# Patient Record
Sex: Female | Born: 1935 | Race: White | Hispanic: No | Smoking: Current every day smoker
Health system: Southern US, Community
[De-identification: ages and names within clinical notes are randomized; demographics above are authoritative.]

## PROBLEM LIST (undated history)

## (undated) DIAGNOSIS — Z950 Presence of cardiac pacemaker: Secondary | ICD-10-CM

## (undated) DIAGNOSIS — F32A Depression, unspecified: Secondary | ICD-10-CM

## (undated) DIAGNOSIS — J449 Chronic obstructive pulmonary disease, unspecified: Secondary | ICD-10-CM

## (undated) DIAGNOSIS — H269 Unspecified cataract: Secondary | ICD-10-CM

## (undated) DIAGNOSIS — I779 Disorder of arteries and arterioles, unspecified: Secondary | ICD-10-CM

## (undated) DIAGNOSIS — R011 Cardiac murmur, unspecified: Secondary | ICD-10-CM

## (undated) DIAGNOSIS — IMO0002 Reserved for concepts with insufficient information to code with codable children: Secondary | ICD-10-CM

## (undated) DIAGNOSIS — F329 Major depressive disorder, single episode, unspecified: Secondary | ICD-10-CM

## (undated) DIAGNOSIS — F419 Anxiety disorder, unspecified: Secondary | ICD-10-CM

## (undated) DIAGNOSIS — N840 Polyp of corpus uteri: Secondary | ICD-10-CM

## (undated) DIAGNOSIS — Z8619 Personal history of other infectious and parasitic diseases: Secondary | ICD-10-CM

## (undated) DIAGNOSIS — E7800 Pure hypercholesterolemia, unspecified: Secondary | ICD-10-CM

## (undated) DIAGNOSIS — R896 Abnormal cytological findings in specimens from other organs, systems and tissues: Secondary | ICD-10-CM

## (undated) DIAGNOSIS — N950 Postmenopausal bleeding: Secondary | ICD-10-CM

## (undated) DIAGNOSIS — N83209 Unspecified ovarian cyst, unspecified side: Secondary | ICD-10-CM

## (undated) DIAGNOSIS — I10 Essential (primary) hypertension: Secondary | ICD-10-CM

## (undated) DIAGNOSIS — N939 Abnormal uterine and vaginal bleeding, unspecified: Secondary | ICD-10-CM

## (undated) DIAGNOSIS — Z87898 Personal history of other specified conditions: Secondary | ICD-10-CM

## (undated) DIAGNOSIS — N951 Menopausal and female climacteric states: Secondary | ICD-10-CM

## (undated) HISTORY — DX: Postmenopausal bleeding: N95.0

## (undated) HISTORY — DX: Anxiety disorder, unspecified: F41.9

## (undated) HISTORY — DX: Reserved for concepts with insufficient information to code with codable children: IMO0002

## (undated) HISTORY — DX: Depression, unspecified: F32.A

## (undated) HISTORY — DX: Personal history of other infectious and parasitic diseases: Z86.19

## (undated) HISTORY — DX: Disorder of arteries and arterioles, unspecified: I77.9

## (undated) HISTORY — PX: CATARACT EXTRACTION W/ INTRAOCULAR LENS IMPLANT: SHX1309

## (undated) HISTORY — PX: BREAST SURGERY: SHX581

## (undated) HISTORY — DX: Unspecified ovarian cyst, unspecified side: N83.209

## (undated) HISTORY — DX: Abnormal cytological findings in specimens from other organs, systems and tissues: R89.6

## (undated) HISTORY — DX: Major depressive disorder, single episode, unspecified: F32.9

## (undated) HISTORY — DX: Pure hypercholesterolemia, unspecified: E78.00

## (undated) HISTORY — DX: Menopausal and female climacteric states: N95.1

## (undated) HISTORY — DX: Polyp of corpus uteri: N84.0

## (undated) HISTORY — PX: EYE SURGERY: SHX253

## (undated) HISTORY — DX: Abnormal uterine and vaginal bleeding, unspecified: N93.9

## (undated) HISTORY — DX: Chronic obstructive pulmonary disease, unspecified: J44.9

## (undated) HISTORY — DX: Personal history of other specified conditions: Z87.898

## (undated) HISTORY — DX: Unspecified cataract: H26.9

## (undated) HISTORY — PX: CERVICAL BIOPSY: SHX590

## (undated) HISTORY — DX: Essential (primary) hypertension: I10

---
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
## 1998-06-08 ENCOUNTER — Other Ambulatory Visit: Admission: RE | Admit: 1998-06-08 | Discharge: 1998-06-08 | Payer: Self-pay | Admitting: *Deleted

## 1999-04-04 ENCOUNTER — Other Ambulatory Visit: Admission: RE | Admit: 1999-04-04 | Discharge: 1999-04-04 | Payer: Self-pay | Admitting: *Deleted

## 1999-07-24 ENCOUNTER — Encounter: Payer: Self-pay | Admitting: *Deleted

## 1999-07-24 ENCOUNTER — Encounter: Admission: RE | Admit: 1999-07-24 | Discharge: 1999-07-24 | Payer: Self-pay | Admitting: *Deleted

## 2000-07-23 ENCOUNTER — Other Ambulatory Visit: Admission: RE | Admit: 2000-07-23 | Discharge: 2000-07-23 | Payer: Self-pay | Admitting: *Deleted

## 2009-11-15 DIAGNOSIS — IMO0001 Reserved for inherently not codable concepts without codable children: Secondary | ICD-10-CM

## 2009-11-15 DIAGNOSIS — N950 Postmenopausal bleeding: Secondary | ICD-10-CM

## 2009-11-15 DIAGNOSIS — N83209 Unspecified ovarian cyst, unspecified side: Secondary | ICD-10-CM

## 2009-11-15 DIAGNOSIS — N840 Polyp of corpus uteri: Secondary | ICD-10-CM

## 2009-11-15 HISTORY — DX: Postmenopausal bleeding: N95.0

## 2009-11-15 HISTORY — DX: Polyp of corpus uteri: N84.0

## 2009-11-15 HISTORY — DX: Reserved for inherently not codable concepts without codable children: IMO0001

## 2009-11-15 HISTORY — DX: Unspecified ovarian cyst, unspecified side: N83.209

## 2009-12-29 HISTORY — PX: LAPAROSCOPIC ASSISTED VAGINAL HYSTERECTOMY: SHX5398

## 2010-01-23 ENCOUNTER — Encounter (INDEPENDENT_AMBULATORY_CARE_PROVIDER_SITE_OTHER): Payer: Self-pay | Admitting: Obstetrics and Gynecology

## 2010-01-23 ENCOUNTER — Ambulatory Visit (HOSPITAL_COMMUNITY)
Admission: RE | Admit: 2010-01-23 | Discharge: 2010-01-24 | Payer: Self-pay | Source: Home / Self Care | Admitting: Obstetrics and Gynecology

## 2010-02-07 ENCOUNTER — Ambulatory Visit
Admission: RE | Admit: 2010-02-07 | Discharge: 2010-02-07 | Payer: Self-pay | Source: Home / Self Care | Admitting: Gynecologic Oncology

## 2010-02-19 ENCOUNTER — Ambulatory Visit (HOSPITAL_COMMUNITY)
Admission: RE | Admit: 2010-02-19 | Discharge: 2010-02-19 | Payer: Self-pay | Source: Home / Self Care | Admitting: Gynecologic Oncology

## 2010-03-13 ENCOUNTER — Encounter (INDEPENDENT_AMBULATORY_CARE_PROVIDER_SITE_OTHER): Payer: Self-pay | Admitting: *Deleted

## 2010-03-14 ENCOUNTER — Encounter
Admission: RE | Admit: 2010-03-14 | Discharge: 2010-03-14 | Payer: Self-pay | Source: Home / Self Care | Attending: Obstetrics and Gynecology | Admitting: Obstetrics and Gynecology

## 2010-04-02 ENCOUNTER — Encounter
Admission: RE | Admit: 2010-04-02 | Discharge: 2010-04-02 | Payer: Self-pay | Source: Home / Self Care | Attending: Obstetrics and Gynecology | Admitting: Obstetrics and Gynecology

## 2010-04-12 ENCOUNTER — Encounter (INDEPENDENT_AMBULATORY_CARE_PROVIDER_SITE_OTHER): Payer: Self-pay | Admitting: *Deleted

## 2010-04-16 ENCOUNTER — Ambulatory Visit
Admission: RE | Admit: 2010-04-16 | Discharge: 2010-04-16 | Payer: Self-pay | Source: Home / Self Care | Attending: Internal Medicine | Admitting: Internal Medicine

## 2010-05-02 NOTE — Letter (Signed)
Summary: [facility] Instructions  [facility]  [address] [place], [place] [address]   Phone: [phone]  Fax: [phone]       [name]    03-Jun-1935    MRN: [id number]        Procedure Day /Date: Friday 05-03-10     Arrival Time: 9:00 am     Procedure Time: 10:00 am     Location of Procedure:                    _x _  [facility] (4th Floor)  PREPARATION FOR COLONOSCOPY WITH MOVIPREP   Starting 5 days prior to your procedure  Sun. 1/29  do not eat nuts, seeds, popcorn, corn, beans, peas,  salads, or any raw vegetables.  Do not take any fiber supplements (e.g. Metamucil, Citrucel, and Benefiber).  THE DAY BEFORE YOUR PROCEDURE         DATE:  05-02-10  DAY: Thursday   1.  Drink clear liquids the entire day-NO SOLID FOOD  2.  Do not drink anything colored red or purple.  Avoid juices with pulp.  No orange juice.  3.  Drink at least 64 oz. (8 glasses) of fluid/clear liquids during the day to prevent dehydration and help the prep work efficiently.  CLEAR LIQUIDS INCLUDE: Water Jello Ice Popsicles Tea (sugar ok, no milk/cream) Powdered fruit flavored drinks Coffee (sugar ok, no milk/cream) Gatorade Juice: apple, white grape, white cranberry  Lemonade Clear bullion, consomm, broth Carbonated beverages (any kind) Strained chicken noodle soup Hard Candy                             4.  In the morning, mix first dose of MoviPrep solution:    Empty 1 Pouch A and 1 Pouch B into the disposable container    Add lukewarm drinking water to the top line of the container. Mix to dissolve    Refrigerate (mixed solution should be used within 24 hrs)  5.  Begin drinking the prep at 5:00 p.m. The MoviPrep container is divided by 4 marks.   Every 15 minutes drink the solution down to the next mark (approximately 8 oz) until the full liter is complete.   6.  Follow completed prep with 16 oz of clear liquid of your choice (Nothing red or purple).   Continue to drink clear liquids until bedtime.  7.  Before going to bed, mix second dose of MoviPrep solution:    Empty 1 Pouch A and 1 Pouch B into the disposable container    Add lukewarm drinking water to the top line of the container. Mix to dissolve    Refrigerate  THE DAY OF YOUR PROCEDURE      DATE:  05-03-10  DAY: Friday  Beginning at  5:00 a.m. (5 hours before procedure):         1. Every 15 minutes, drink the solution down to the next mark (approx 8 oz) until the full liter is complete.  2. Follow completed prep with 16 oz. of clear liquid of your choice.    3. You may drink clear liquids until  8:00 a.m. (2 HOURS BEFORE PROCEDURE).   MEDICATION INSTRUCTIONS  Unless otherwise instructed, you should take regular prescription medications with a small sip of water   as early as possible the morning of your procedure.    Additional medication instructions: Do not take HCTZ day  of procedure.         OTHER INSTRUCTIONS  You will need a responsible adult at least 73 years of age to accompany you and drive you home.   This person must remain in the waiting room during your procedure.  Wear loose fitting clothing that is easily removed.  Leave jewelry and other valuables at home.  However, you may wish to bring a book to read or  an iPod/MP3 player to listen to music as you wait for your procedure to start.  Remove all body piercing jewelry and leave at home.  Total time from sign-in until discharge is approximately 2-3 hours.  You should go home directly after your procedure and rest.  You can resume normal activities the  day after your procedure.  The day of your procedure you should not:   Drive   Make legal decisions   Operate machinery   Drink alcohol   Return to work  You will receive specific instructions about eating, activities and medications before you leave.    The above instructions have been reviewed and explained to me by   Ezra Sites RN  April 16, 2010 1:59 PM     I fully understand and can verbalize these instructions _____________________________ Date _________

## 2010-05-02 NOTE — Miscellaneous (Signed)
Summary: [facility]  Clinical Lists Changes  Medications: Added new medication of MOVIPREP 100 GM  SOLR (PEG-KCL-NACL-NASULF-NA ASC-C) As per prep instructions. - Signed Rx of MOVIPREP 100 GM  SOLR (PEG-KCL-NACL-NASULF-NA ASC-C) As per prep instructions.;  #1 x 0;  Signed;  Entered by: Ezra Sites RN;  Authorized by: Iva Boop MD, [facility];  Method used: [facility] [address] [phone]*, [address] [place], [place], [place]  [id number], Ph: [phone] or [phone], Fax: [phone] Observations: Added new observation of NKA: T (04/16/2010 13:08)    Prescriptions: MOVIPREP 100 GM  SOLR (PEG-KCL-NACL-NASULF-NA ASC-C) As per prep instructions.  #1 x 0   Entered by:   Ezra Sites RN   Authorized by:   Iva Boop MD, [facility]   Signed by:   Ezra Sites RN on 04/16/2010   Method used:   [facility]  [address] [phone]* (retail)       [address]       [place], [place]  [id number]       Ph: [phone] or [phone]       Fax: [phone]   RxID:   [id number]

## 2010-05-02 NOTE — Letter (Signed)
Summary: Pre Visit Letter Revised  [facility]  [address] [place], [place] [address]   Phone: [phone]  Fax: [phone]        03/13/2010 MRN: [id number] [facility] [address] [place], [place]  [address]             Procedure Date:  05/03/2010  Welcome to the Gastroenterology Division at [facility].    You are scheduled to see a nurse for your pre-procedure visit on 04/16/2010 at 8:30AM on the 3rd floor at [facility], [address]  We ask that you try to arrive at our office 15 minutes prior to your appointment time to allow for check-in.  Please take a minute to review the attached form.  If you answer "Yes" to one or more of the questions on the first page, we ask that you call the person listed at your earliest opportunity.  If you answer "No" to all of the questions, please complete the rest of the form and bring it to your appointment.    Your nurse visit will consist of discussing your medical and surgical history, your immediate family medical history, and your medications.   If you are unable to list all of your medications on the form, please bring the medication bottles to your appointment and we will list them.  We will need to be aware of both prescribed and over the counter drugs.  We will need to know exact dosage information as well.    Please be prepared to read and sign documents such as consent forms, a financial agreement, and acknowledgement forms.  If necessary, and with your consent, a friend or relative is welcome to sit-in on the nurse visit with you.  Please bring your insurance card so that we may make a copy of it.  If your insurance requires a referral to see a specialist, please bring your referral form from your primary care physician.  No co-pay is required for this nurse visit.     If you cannot keep your appointment, please call [phone] to cancel or reschedule prior to your appointment date.  This allows  [place] the opportunity to schedule an appointment for another patient in need of care.    Thank you for choosing [facility] for your medical needs.  We appreciate the opportunity to care for you.  Please visit [place] at our website  to learn more about our practice.  Sincerely, The Gastroenterology Division

## 2010-05-03 ENCOUNTER — Ambulatory Visit: Admit: 2010-05-03 | Payer: Self-pay | Admitting: Internal Medicine

## 2010-05-03 ENCOUNTER — Encounter: Payer: Self-pay | Admitting: Internal Medicine

## 2010-05-03 ENCOUNTER — Other Ambulatory Visit (AMBULATORY_SURGERY_CENTER): Payer: Medicare Other | Admitting: Internal Medicine

## 2010-05-03 DIAGNOSIS — K644 Residual hemorrhoidal skin tags: Secondary | ICD-10-CM

## 2010-05-03 DIAGNOSIS — Z1211 Encounter for screening for malignant neoplasm of colon: Secondary | ICD-10-CM

## 2010-05-03 DIAGNOSIS — K5730 Diverticulosis of large intestine without perforation or abscess without bleeding: Secondary | ICD-10-CM

## 2010-05-16 NOTE — Procedures (Signed)
Summary: Colonoscopy   Colonoscopy  Procedure date:  05/03/2010  Findings:      Location:  [facility].   COLONOSCOPY PROCEDURE REPORT  PATIENT:  [name], [name]  MR#:  [id number] BIRTHDATE:   1935-10-06, 74 yrs. old   GENDER:   female ENDOSCOPIST:   Iva Boop, MD, [facility] REF. BY: Janine Limbo, M.D. PROCEDURE DATE:  05/03/2010 PROCEDURE:  Average-risk screening colonoscopy G0121 ASA CLASS:   Class II INDICATIONS: Routine Risk Screening  MEDICATIONS:    Fentanyl 75 mcg IV, Versed 7 mg IV  DESCRIPTION OF PROCEDURE:   After the risks benefits and alternatives of the procedure were thoroughly explained, informed consent was obtained.  Digital rectal exam was performed and revealed no abnormalities.   The [id number] endoscope was introduced through the anus and advanced to the cecum, which was identified by both the appendix and ileocecal valve, without limitations.  The quality of the prep was excellent, using MoviPrep.  The instrument was then slowly withdrawn as the colon was fully examined. Insertion: 6 minutes (with pediatric scope, 7 minutes attempt with adult scope) and withdrawal of 10:41 minutes <<PROCEDUREIMAGES>>      <<OLD IMAGES>>  FINDINGS:  Severe diverticulosis was found in the sigmoid colon. There was angulation, luminal stenosis and fixation of sigmoid colon (s/p hysterectomy).  This was otherwise a normal examination of the colon.   Retroflexed views in the rectum revealed hypertrophied anal papillae and old  external hemorrhoids.    The scope was then withdrawn from the patient and the procedure completed.  COMPLICATIONS:   None ENDOSCOPIC IMPRESSION:  1) Severe diverticulosis in the sigmoid colon - with angulation and stenosis  2) External hemorrhoids/hypertrophied anal papillae  3) Otherwise normal examination, excellent prep    REPEAT EXAM:   In for as needed.    Iva Boop, MD, [facility]  CC: Leonard Schwartz, MD The  Patient Cleda Mccreedy, MD

## 2010-06-12 LAB — CBC
Hemoglobin: 11 g/dL — ABNORMAL LOW (ref 12.0–15.0)
MCHC: 34 g/dL (ref 30.0–36.0)
MCHC: 34.6 g/dL (ref 30.0–36.0)
Platelets: 151 10*3/uL (ref 150–400)
RBC: 3.43 MIL/uL — ABNORMAL LOW (ref 3.87–5.11)
RDW: 14.6 % (ref 11.5–15.5)

## 2010-06-12 LAB — SURGICAL PCR SCREEN
MRSA, PCR: NEGATIVE
Staphylococcus aureus: NEGATIVE

## 2010-06-12 LAB — BASIC METABOLIC PANEL
Calcium: 9.3 mg/dL (ref 8.4–10.5)
GFR calc Af Amer: >60 mL/min (ref >60)
GFR calc non Af Amer: >60 mL/min (ref >60)
Sodium: 135 mEq/L (ref 135–145)

## 2010-06-13 DIAGNOSIS — N951 Menopausal and female climacteric states: Secondary | ICD-10-CM

## 2010-06-13 HISTORY — DX: Menopausal and female climacteric states: N95.1

## 2010-09-18 ENCOUNTER — Ambulatory Visit: Payer: Medicare Other | Attending: Gynecologic Oncology | Admitting: Gynecologic Oncology

## 2010-09-18 DIAGNOSIS — Z79899 Other long term (current) drug therapy: Secondary | ICD-10-CM | POA: Insufficient documentation

## 2010-09-18 DIAGNOSIS — D279 Benign neoplasm of unspecified ovary: Secondary | ICD-10-CM | POA: Insufficient documentation

## 2010-09-18 DIAGNOSIS — Z90710 Acquired absence of both cervix and uterus: Secondary | ICD-10-CM | POA: Insufficient documentation

## 2010-09-18 DIAGNOSIS — C549 Malignant neoplasm of corpus uteri, unspecified: Secondary | ICD-10-CM | POA: Insufficient documentation

## 2010-09-19 NOTE — Consult Note (Signed)
Jimenez, [name]                ACCOUNT NO.:  [id number]  MEDICAL RECORD NO.:  [id number]  LOCATION:  GYN                          FACILITY:  [facility]  PHYSICIAN:  Gina Lakatos A. Duard Brady, MD    DATE OF BIRTH:  Oct 14, 1935  DATE OF CONSULTATION:  09/18/2010 DATE OF DISCHARGE:                                CONSULTATION   HISTORY OF PRESENT ILLNESS:  [name] is a 75 year old, gravida 0, who had some episodes of vaginal bleeding and was referred to Dr. Stefano Jimenez. Pap since that time revealed atypical glandular cells of undetermined significance.  Cervical biopsy was negative, ECC was negative, endometrial biopsy revealed benign endocervical tissue.  Gina underwent an ultrasound that revealed the uterus to be 7 x 4 cm and was noted at that time to have a 4.2 cm left ovarian heterogeneous mass.  CA-125 was normal.  Based on the constellation of symptoms in October 2011, Gina underwent laparoscopically assisted vaginal hysterectomy, BSO. Operative findings included some adhesive disease of the left ovary and there was no evidence of metastatic disease or carcinomatosis. Abdominal pelvic washings returned positive for malignant cells.  Within the pathology of the uterus, had an invasive endometrioid adenocarcinoma, grade 1, measuring 2.2 cm confined to the inner half of the myometrium.  It was 0.7 cm with a myometrial thickness of 1.5 cm. There was no lymphovascular space involvement.  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  Gina did undergo a postoperative CT scan that revealed no evidence of adenopathy and Gina was dispositioned to close followup.  Gina comes in today and is overall doing quite well.  REVIEW OF SYSTEMS:  Gina denies any chest pain, shortness of breath, nausea, vomiting, fever, chills.  Gina did not realize, Gina states, as to how depressed Gina was after her surgery.  Her primary physician, Dr. Jeannetta Jimenez, had her on citalopram at 40  mg, Dr. Stefano Jimenez drop this down to 20.  Gina feels that Gina needs the 40 again. 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  They go long walks, take their meals together, and Gina states this has helped significantly with her spirits.  Gina states that Gina is very needy of sleep and if Gina does not get a good night's sleep, Gina has difficulty functioning during the day.  Gina has been taking lorazepam 0.25 mg at night for many years and need to refill of this as well, which Gina is requesting.  Gina denies any nausea, vomiting, fever, chills, early satiety, abdominal pain, unintentional weight loss, weight gain, or bloating.  Gina notices that her stools are a little "tackier," but when talking to her, Gina has been eating a lot of bananas recently and that might be causing the change in her bowels.  Gina recently did have a colonoscopy in February that showed diverticulosis in the sigmoid colon with angulation and stenosis, external hemorrhoids.  MEDICATIONS:1. Citalopram 40 mg daily. 2. Amlodipine 5 mg. 3. Hydrochlorothiazide 25 mg daily. 4.  Simvastatin 20 mg daily. 5. Lorazepam 0.25 mg nightly.  HEALTH MAINTENANCE:  Gina is up-to-date on her mammograms.  There was a mass noted on her left breast.  Gina underwent an ultrasound, which was negative and followup in a year recommended.  PHYSICAL EXAMINATION:  VITAL SIGNS:  Weight 123 pounds, height 5 feet 4 inches, blood pressure 130/74, temperature 98.3, respirations 18. GENERAL:  A well-nourished, well-developed female in no acute distress. NECK:  Supple.  There is no lymphadenopathy, no thyromegaly. LUNGS:  Clear to auscultation bilaterally. CARDIOVASCULAR:  Regular rate and rhythm. ABDOMEN:  Soft, nontender, nondistended.  There are no palpable masses or hepatosplenomegaly.  Groins are  negative for adenopathy. EXTREMITIES:  There is no edema. PELVIC:  External genitalia is within normal limits.  The vagina is markedly atrophic.  The vaginal cuff is visualized.  There are no visible lesions.  Bimanual examination reveals no masses or nodularity. RECTAL:  Confirms.  ASSESSMENT: 73. 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  After discussion, the patient is incredibly anxious about     recurrence of her cancer, would be feeling more comfortable if for     the 5 years a followup, Gina was seen by an oncologist and would     like to return to see me in 4 months. 2. Gina will follow up with Dr. Jeannetta Jimenez as scheduled. 3. I wrote her 3 months prescriptions for citalopram and lorazepam.     Gina Doto A. Duard Brady, MD     PAG/MEDQ  D:  09/18/2010  T:  09/19/2010  Job:  [id number]  cc:   [name], M.D. Fax: [phone]  [name], R.N. [address] [place], [place] [address]  [name], M.D. Fax: [phone]  Electronically Signed by Gina Mccreedy MD on 09/19/2010 02:52:46 PM

## 2010-11-29 ENCOUNTER — Other Ambulatory Visit: Payer: Self-pay | Admitting: Family Medicine

## 2010-11-29 DIAGNOSIS — Z780 Asymptomatic menopausal state: Secondary | ICD-10-CM

## 2010-12-05 ENCOUNTER — Ambulatory Visit
Admission: RE | Admit: 2010-12-05 | Discharge: 2010-12-05 | Disposition: A | Discharge status: Home / Self Care | Payer: Medicare Other | Source: Ambulatory Visit | Attending: Family Medicine | Admitting: Family Medicine

## 2010-12-05 DIAGNOSIS — Z780 Asymptomatic menopausal state: Secondary | ICD-10-CM

## 2011-01-15 ENCOUNTER — Other Ambulatory Visit (HOSPITAL_COMMUNITY)
Admission: RE | Admit: 2011-01-15 | Discharge: 2011-01-15 | Disposition: A | Discharge status: Home / Self Care | Payer: Medicare Other | Source: Ambulatory Visit | Attending: Gynecologic Oncology | Admitting: Gynecologic Oncology

## 2011-01-15 ENCOUNTER — Ambulatory Visit: Payer: Medicare Other | Attending: Gynecologic Oncology | Admitting: Gynecologic Oncology

## 2011-01-15 ENCOUNTER — Other Ambulatory Visit: Payer: Self-pay | Admitting: Gynecologic Oncology

## 2011-01-15 DIAGNOSIS — Z8540 Personal history of malignant neoplasm of unspecified female genital organ: Secondary | ICD-10-CM | POA: Insufficient documentation

## 2011-01-15 DIAGNOSIS — C569 Malignant neoplasm of unspecified ovary: Secondary | ICD-10-CM | POA: Insufficient documentation

## 2011-01-15 DIAGNOSIS — Z90710 Acquired absence of both cervix and uterus: Secondary | ICD-10-CM | POA: Insufficient documentation

## 2011-01-15 DIAGNOSIS — Z79899 Other long term (current) drug therapy: Secondary | ICD-10-CM | POA: Insufficient documentation

## 2011-01-15 DIAGNOSIS — Z9079 Acquired absence of other genital organ(s): Secondary | ICD-10-CM | POA: Insufficient documentation

## 2011-01-17 NOTE — Consult Note (Signed)
NAMEDOLLYE, GLASSER                ACCOUNT NO.:  [id number]  MEDICAL RECORD NO.:  [id number]  LOCATION:  GYN                          FACILITY:  [facility]  PHYSICIAN:  Jillisa Harris A. Duard Brady, MD    DATE OF BIRTH:  09/24/1935  DATE OF CONSULTATION:  01/15/2011 DATE OF DISCHARGE:                                CONSULTATION   Ms Grosch is a very pleasant 75 year old who had some episodes of vaginal bleeding, was referred to Dr. Stefano Gaul.  Pap smear at that time revealed atypical glandular cells of undetermined significance and evaluation included cervical biopsy, ECC that was negative as well as an endometrial biopsy that revealed benign endocervical tissue.  Ultrasound revealed a small uterus at that time, a 4.2 cm left ovarian heterogeneous mass with a normal CA-125.  In October 2011, she underwenta laparoscopic-assisted vaginal hysterectomy  and BSO. Operative findings included adhesive disease of  the left ovary  with no evidence of metastatic disease or carcinomatosis.  Unfortunately,  abdominal pelvic washings which were appropriately performed were positive for malignant cells.  Within the uterus, there was an invasive endometrioid adenocarcinoma, grade 1, measuring 2.2 cm confined to the inner half of the myometrium where there was 0.7 cm out of 1.5 cm of myometrial invasion.  There was no lymphovascular space involvement.  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  She did have a postoperative CT scan that revealed no evidence of any adenopathy and she was dispositioned to close followup. She comes in today and is overall doing fairly well.  I last saw her in June 2012, at which time, her exam was unremarkable.  REVIEW OF SYSTEMS:  She does complain of getting tired early.  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  She otherwise states busy mowing her yard and is very pleased that she is able to do those things outside.  She denies any vaginal bleeding, any change in bowel or bladder habits.  She states that each time she voids she does need to strain a little bit and has a small bowel movement. There have been no changes in her medications.  She was seen by Dr. Stefano Gaul in March 2012.  MEDICATION:  List is reviewed and includes: 1. Citalopram 40 mg daily. 2. Amlodipine 5 mg daily. 3. Hydrochlorothiazide 25 mg daily. 4. Simvastatin 20 mg daily. 5. Lorazepam 0.25 mg nightly.  HEALTH MAINTENANCE:  She is up-to-date on her mammograms.  She had a colonoscopy again as stated above that revealed hemorrhoids.  PHYSICAL EXAMINATION:  VITAL SIGNS:  Weight 123 pounds which is stable, blood pressure 120/64, respirations 16, temperature 98. GENERAL:  A well-nourished, well-developed female, in no acute distress. NECK:  Supple.  There  is no lymphadenopathy, no thyromegaly. LUNGS:  Clear to auscultation bilaterally. CARDIOVASCULAR:  Regular rate and rhythm. ABDOMEN:  Soft, nontender, nondistended.  No palpable masses or hepatosplenomegaly.  Groins are negative for adenopathy. EXTREMITIES:  There is no edema. PELVIC:  External genitalia is within normal limits, though atrophic. The vagina is markedly atrophic.  The vaginal cuff is visualized, there are no visible lesions.  ThinPrep Pap was submitted without difficulty. Bimanual examination reveals no masses or nodularity. RECTAL:  Confirms.  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  PLAN:  She will see Dr. Stefano Gaul in 3 months and return to see [place] in 6 months.  I have refilled her lorazepam with a #90 which should be, based on how many she takes, about a 6 month supply.     Caree Wolpert A. Duard Brady, MD     PAG/MEDQ  D:  01/15/2011  T:  01/15/2011  Job:  [id number]  cc:   Janine Limbo, M.D. Fax: [phone]  Telford Nab, R.N. [address] [place], [place] [address]  Electronically Signed by Cleda Mccreedy MD on 01/17/2011 10:02:49 AM

## 2011-03-06 ENCOUNTER — Other Ambulatory Visit: Payer: Self-pay | Admitting: Obstetrics and Gynecology

## 2011-03-06 DIAGNOSIS — Z1231 Encounter for screening mammogram for malignant neoplasm of breast: Secondary | ICD-10-CM

## 2011-04-04 ENCOUNTER — Ambulatory Visit: Payer: Medicare Other

## 2011-04-08 ENCOUNTER — Ambulatory Visit
Admission: RE | Admit: 2011-04-08 | Discharge: 2011-04-08 | Disposition: A | Discharge status: Home / Self Care | Payer: Medicare Other | Source: Ambulatory Visit | Attending: Obstetrics and Gynecology | Admitting: Obstetrics and Gynecology

## 2011-04-08 DIAGNOSIS — Z1231 Encounter for screening mammogram for malignant neoplasm of breast: Secondary | ICD-10-CM

## 2011-06-12 ENCOUNTER — Ambulatory Visit: Payer: Medicare Other | Attending: Gynecologic Oncology | Admitting: Gynecologic Oncology

## 2011-06-12 ENCOUNTER — Encounter: Payer: Self-pay | Admitting: Gynecologic Oncology

## 2011-06-12 VITALS — BP 136/68 | HR 80 | Temp 98.2°F | Resp 22 | Ht 64.65 in | Wt 120.5 lb

## 2011-06-12 DIAGNOSIS — I10 Essential (primary) hypertension: Secondary | ICD-10-CM | POA: Insufficient documentation

## 2011-06-12 DIAGNOSIS — Z79899 Other long term (current) drug therapy: Secondary | ICD-10-CM | POA: Insufficient documentation

## 2011-06-12 DIAGNOSIS — C541 Malignant neoplasm of endometrium: Secondary | ICD-10-CM | POA: Insufficient documentation

## 2011-06-12 DIAGNOSIS — C549 Malignant neoplasm of corpus uteri, unspecified: Secondary | ICD-10-CM | POA: Insufficient documentation

## 2011-06-12 DIAGNOSIS — E7800 Pure hypercholesterolemia, unspecified: Secondary | ICD-10-CM | POA: Insufficient documentation

## 2011-06-12 NOTE — Progress Notes (Signed)
Consult Note: Gyn-Onc  [name] 76 y.o. female  CC:  Chief Complaint  Patient presents with  . Endo ca    Follow up    HPI:[name] is a very pleasant 76 year old who had some episodes of vaginal bleeding, was referred to Dr. Stefano Gaul. Pap smear at that time revealed atypical glandular cells of undetermined significance and evaluation  included cervical biopsy, ECC that was negative as well as an endometrial biopsy that revealed benign endocervical tissue. Ultrasound revealed a small uterus at that time, a 4.2 cm left ovarian heterogeneous mass with a normal CA-125. In October 2011, she underwent a laparoscopic-assisted vaginal hysterectomy  and BSO. Operative findings included adhesive disease of the left ovary with no evidence of metastatic disease or carcinomatosis. Unfortunately, abdominal pelvic washings which were appropriately performed were positive for  malignant cells. Within the uterus, there was an invasive endometrioid adenocarcinoma, grade 1, measuring 2.2 cm confined to the inner half of the myometrium where there was 0.7 cm out of 1.5 cm of myometrial  invasion. There was no lymphovascular space involvement. 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 She did have a postoperative CT scan that revealed no  evidence of any adenopathy and she was dispositioned to close followup. She comes in today and is overall doing fairly well. I last saw her in October 2012, at which time, her exam was unremarkable and her pap smear was negative.  REVIEW OF SYSTEMS: She does complain of getting tired early. 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 She otherwise states busy mowing her yard and is very pleased that she is  able to do those things outside. She denies any vaginal bleeding, any change in bowel or bladder habits. She states that each time she voids she does need to strain a little bit and has a small bowel movement.  There have been no changes in her medications. She was seen by Dr. Stefano Gaul in December 2012. She was bitten and scratched by a dog that she's been helping to take care of on her right forearm. She thinks that she needs to get more exercise implant finding out more once the weather is a little bit warmer.    Interval History:   Review of Systems: As above otherwise 10 point ROS is negative.  Current Meds:  Outpatient Encounter Prescriptions as of 06/12/2011  Medication Sig Dispense Refill  . amLODipine (NORVASC) 5 MG tablet Take 5 mg by mouth daily.      . citalopram (CELEXA) 40 MG tablet Take 40 mg by mouth daily.      . hydrochlorothiazide (HYDRODIURIL) 25 MG tablet Take 25 mg by mouth daily.      Marland Kitchen LORazepam (ATIVAN) 0.5 MG tablet Take 0.25 mg by mouth at bedtime.      . simvastatin (ZOCOR) 20 MG tablet Take 20 mg by mouth every evening.        Allergy: No Known Allergies  Social Hx:   History   Social History  . Marital Status: Legally  Separated    Spouse Name: N/A    Number of Children: N/A  . Years of Education: N/A   Occupational History  . Not on file.   Social History Main Topics  . Smoking status: Current Everyday Smoker -- 0.5 packs/day for 20 years    Types: Cigarettes  . Smokeless tobacco: Not on file  . Alcohol Use: No  . Drug Use: Not on file  . Sexually Active: Not on file   Other Topics Concern  . Not on file   Social History Narrative  . No narrative on file    Past Surgical Hx:  Past Surgical History  Procedure Date  . Cervical biopsy   . Laparoscopic assisted vaginal hysterectomy 12/2009      BSO    Past Medical Hx:  Past Medical History  Diagnosis Date  . Depression   . Anxiety     Stable  . Vaginal bleeding   . Endometrioid adenocarcinoma   . Hemorrhoids   . Hypertension   . Hypercholesterolemia     Family Hx:  Family History  Problem Relation Age of Onset  . Lung cancer Brother     Vitals:  Blood pressure 136/68, pulse 80, temperature 98.2 F (36.8 C), resp. rate 22, height 5' 4.65" (1.642 m), weight 120 lb 8 oz (54.658 kg).  Physical Exam:   GENERAL: A well-nourished, well-developed female, in no acute distress.   NECK: Supple. There is no lymphadenopathy, no thyromegaly.   LUNGS: Clear to auscultation bilaterally.   CARDIOVASCULAR: Regular rate and rhythm.   ABDOMEN: Soft, nontender, nondistended. No palpable masses or hepatosplenomegaly. Groins are negative for adenopathy.   EXTREMITIES: There is no edema.  Well healing ecchymosis right forearm status post dog bite, the skin is not broken. There is no erythema there is no fluctuance there is no evidence of infection. Marland Kitchen  PELVIC: External genitalia is within normal limits, though atrophic. The vagina is markedly atrophic. The vaginal cuff is visualized, there are no visible lesions.  Bimanual examination reveals no masses or nodularity.   RECTAL: Confirms.    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   PLAN: She will see Dr. Stefano Gaul in 6 months and return to see [place] in 12 months. [name] A., MD 06/12/2011, 3:07 PM

## 2011-06-12 NOTE — Patient Instructions (Signed)
Followup with Dr. Stefano Gaul in 6 months and return to see [place] in one year

## 2011-11-04 ENCOUNTER — Telehealth: Payer: Self-pay | Admitting: Gynecologic Oncology

## 2011-11-04 NOTE — Telephone Encounter (Signed)
Message left with patient stating that it is recommended to begin weaning off celexa.  Informed that the office received a FDA safety alert about the patient taking a higher dose than what is recommended.  At last visit, pt reported taking celexa 40mg  and wanting to take the medication a little longer before weaning off per Telford Nab, RN.  Pt instructed to start weaning off the medication and if further treatment is needed to contact her primary care physician.  Instructed to call for any questions or concerns.

## 2011-11-10 ENCOUNTER — Encounter: Payer: Self-pay | Admitting: Obstetrics and Gynecology

## 2011-11-13 ENCOUNTER — Encounter: Payer: Self-pay | Admitting: Obstetrics and Gynecology

## 2011-11-13 ENCOUNTER — Ambulatory Visit (INDEPENDENT_AMBULATORY_CARE_PROVIDER_SITE_OTHER): Payer: Medicare Other | Admitting: Obstetrics and Gynecology

## 2011-11-13 VITALS — BP 126/74 | Resp 14 | Ht 66.0 in | Wt 116.0 lb

## 2011-11-13 DIAGNOSIS — C569 Malignant neoplasm of unspecified ovary: Secondary | ICD-10-CM

## 2011-11-13 DIAGNOSIS — C55 Malignant neoplasm of uterus, part unspecified: Secondary | ICD-10-CM

## 2011-11-13 NOTE — Progress Notes (Signed)
HISTORY OF PRESENT ILLNESS  [name] is a 76 y.o. year old female,No obstetric history on file., who presents for a problem visit. In October 2011 the patient had a laparoscopic hysterectomy and oophorectomy.  She is technically incompletely staged for her clinical stage is 1A grade 1.  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  She is being followed jointly with a GYN oncologist.  There is no evidence of disease at this point.  The patient has multiple co-morbidities. 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  The patient reports this was completely resected.  Subjective:  The patient complains of fatigue.  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  She is going through a difficult divorce.  She has occasional incontinence of stool and urine.  She continues to smoke cigarettes.  Objective:  BP 126/74  Resp 14  Ht 5\' 6"  (1.676 m)  Wt 116 lb (52.617 kg)  BMI 18.72 kg/m2   General: no distress Resp: rhonchi from smoking Cardio: regular rate and rhythm, S1, S2 normal, no murmur, click, rub or gallop GI: soft and nontender, no masses, no adenopathy present  External genitalia: normal general appearance Vaginal: atrophic mucosa and no lesions or masses Cervix: absent Adnexa: no masses Uterus: absent  Assessment:  Clinical stage I a grade 1 endometrioid carcinoma of the uterus - no evidence of disease. 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 Fatigue.  Plan:  Lifestyle changes to promote health and rest were discussed. The patient will be followed jointly by [place] and a GYN oncologist. The patient will continue to be followed by her family physician.  Return to office in 1 year(s).   Leonard Schwartz M.D.  11/13/2011 12:37 PM

## 2016-07-14 DIAGNOSIS — R69 Illness, unspecified: Secondary | ICD-10-CM | POA: Diagnosis not present

## 2016-09-26 DIAGNOSIS — J069 Acute upper respiratory infection, unspecified: Secondary | ICD-10-CM | POA: Diagnosis not present

## 2017-01-12 DIAGNOSIS — R69 Illness, unspecified: Secondary | ICD-10-CM | POA: Diagnosis not present

## 2017-01-19 DIAGNOSIS — R69 Illness, unspecified: Secondary | ICD-10-CM | POA: Diagnosis not present

## 2017-02-25 DIAGNOSIS — Z23 Encounter for immunization: Secondary | ICD-10-CM | POA: Diagnosis not present

## 2017-04-08 DIAGNOSIS — I10 Essential (primary) hypertension: Secondary | ICD-10-CM | POA: Diagnosis not present

## 2017-07-13 DIAGNOSIS — R69 Illness, unspecified: Secondary | ICD-10-CM | POA: Diagnosis not present

## 2018-01-25 DIAGNOSIS — Z23 Encounter for immunization: Secondary | ICD-10-CM | POA: Diagnosis not present

## 2018-10-04 DIAGNOSIS — R634 Abnormal weight loss: Secondary | ICD-10-CM | POA: Diagnosis not present

## 2018-10-04 DIAGNOSIS — R69 Illness, unspecified: Secondary | ICD-10-CM | POA: Diagnosis not present

## 2018-10-04 DIAGNOSIS — R55 Syncope and collapse: Secondary | ICD-10-CM | POA: Diagnosis not present

## 2018-10-08 DIAGNOSIS — D45 Polycythemia vera: Secondary | ICD-10-CM | POA: Diagnosis not present

## 2018-10-12 ENCOUNTER — Other Ambulatory Visit (HOSPITAL_COMMUNITY): Payer: Self-pay | Admitting: Family Medicine

## 2018-10-12 DIAGNOSIS — R061 Stridor: Secondary | ICD-10-CM

## 2018-10-14 ENCOUNTER — Other Ambulatory Visit (HOSPITAL_COMMUNITY): Payer: Self-pay | Admitting: Family Medicine

## 2018-10-14 DIAGNOSIS — R55 Syncope and collapse: Secondary | ICD-10-CM

## 2018-10-20 ENCOUNTER — Ambulatory Visit (HOSPITAL_COMMUNITY)
Admission: EM | Disposition: A | Discharge status: Home / Self Care | Payer: Self-pay | Source: Home / Self Care | Attending: Emergency Medicine

## 2018-10-20 ENCOUNTER — Emergency Department (HOSPITAL_BASED_OUTPATIENT_CLINIC_OR_DEPARTMENT_OTHER): Payer: Medicare HMO

## 2018-10-20 ENCOUNTER — Other Ambulatory Visit: Payer: Self-pay

## 2018-10-20 ENCOUNTER — Ambulatory Visit (HOSPITAL_COMMUNITY)
Admission: EM | Admit: 2018-10-20 | Discharge: 2018-10-21 | Disposition: A | Discharge status: Home / Self Care | Payer: Medicare HMO | Attending: Internal Medicine | Admitting: Internal Medicine

## 2018-10-20 ENCOUNTER — Telehealth (HOSPITAL_COMMUNITY): Payer: Self-pay | Admitting: Radiology

## 2018-10-20 ENCOUNTER — Encounter (HOSPITAL_COMMUNITY): Payer: Self-pay

## 2018-10-20 ENCOUNTER — Emergency Department (HOSPITAL_COMMUNITY): Payer: Medicare HMO

## 2018-10-20 DIAGNOSIS — I4430 Unspecified atrioventricular block: Secondary | ICD-10-CM | POA: Diagnosis not present

## 2018-10-20 DIAGNOSIS — I10 Essential (primary) hypertension: Secondary | ICD-10-CM | POA: Diagnosis not present

## 2018-10-20 DIAGNOSIS — I442 Atrioventricular block, complete: Secondary | ICD-10-CM | POA: Diagnosis not present

## 2018-10-20 DIAGNOSIS — Z1159 Encounter for screening for other viral diseases: Secondary | ICD-10-CM | POA: Diagnosis not present

## 2018-10-20 DIAGNOSIS — F17210 Nicotine dependence, cigarettes, uncomplicated: Secondary | ICD-10-CM | POA: Diagnosis not present

## 2018-10-20 DIAGNOSIS — R0902 Hypoxemia: Secondary | ICD-10-CM | POA: Diagnosis not present

## 2018-10-20 DIAGNOSIS — F329 Major depressive disorder, single episode, unspecified: Secondary | ICD-10-CM | POA: Insufficient documentation

## 2018-10-20 DIAGNOSIS — F419 Anxiety disorder, unspecified: Secondary | ICD-10-CM | POA: Insufficient documentation

## 2018-10-20 DIAGNOSIS — I361 Nonrheumatic tricuspid (valve) insufficiency: Secondary | ICD-10-CM

## 2018-10-20 DIAGNOSIS — I340 Nonrheumatic mitral (valve) insufficiency: Secondary | ICD-10-CM

## 2018-10-20 DIAGNOSIS — Z8542 Personal history of malignant neoplasm of other parts of uterus: Secondary | ICD-10-CM | POA: Insufficient documentation

## 2018-10-20 DIAGNOSIS — R55 Syncope and collapse: Secondary | ICD-10-CM | POA: Diagnosis not present

## 2018-10-20 DIAGNOSIS — I352 Nonrheumatic aortic (valve) stenosis with insufficiency: Secondary | ICD-10-CM | POA: Insufficient documentation

## 2018-10-20 DIAGNOSIS — R42 Dizziness and giddiness: Secondary | ICD-10-CM | POA: Diagnosis not present

## 2018-10-20 DIAGNOSIS — R05 Cough: Secondary | ICD-10-CM | POA: Diagnosis not present

## 2018-10-20 DIAGNOSIS — Z20828 Contact with and (suspected) exposure to other viral communicable diseases: Secondary | ICD-10-CM | POA: Diagnosis not present

## 2018-10-20 DIAGNOSIS — Z79899 Other long term (current) drug therapy: Secondary | ICD-10-CM | POA: Diagnosis not present

## 2018-10-20 DIAGNOSIS — E7800 Pure hypercholesterolemia, unspecified: Secondary | ICD-10-CM | POA: Diagnosis not present

## 2018-10-20 DIAGNOSIS — R4781 Slurred speech: Secondary | ICD-10-CM | POA: Diagnosis not present

## 2018-10-20 DIAGNOSIS — Z959 Presence of cardiac and vascular implant and graft, unspecified: Secondary | ICD-10-CM

## 2018-10-20 DIAGNOSIS — R001 Bradycardia, unspecified: Secondary | ICD-10-CM | POA: Diagnosis not present

## 2018-10-20 DIAGNOSIS — R0602 Shortness of breath: Secondary | ICD-10-CM | POA: Diagnosis not present

## 2018-10-20 DIAGNOSIS — Z950 Presence of cardiac pacemaker: Secondary | ICD-10-CM

## 2018-10-20 DIAGNOSIS — R69 Illness, unspecified: Secondary | ICD-10-CM | POA: Diagnosis not present

## 2018-10-20 HISTORY — PX: INSERT / REPLACE / REMOVE PACEMAKER: SUR710

## 2018-10-20 HISTORY — DX: Cardiac murmur, unspecified: R01.1

## 2018-10-20 HISTORY — DX: Presence of cardiac pacemaker: Z95.0

## 2018-10-20 HISTORY — PX: PACEMAKER IMPLANT: EP1218

## 2018-10-20 LAB — COMPREHENSIVE METABOLIC PANEL
ALT: 22 U/L (ref 0–44)
AST: 45 U/L — ABNORMAL HIGH (ref 15–41)
Albumin: 3.8 g/dL (ref 3.5–5.0)
Alkaline Phosphatase: 74 U/L (ref 38–126)
Anion gap: 8 (ref 5–15)
BUN: 16 mg/dL (ref 8–23)
CO2: 24 mmol/L (ref 22–32)
Calcium: 9.2 mg/dL (ref 8.9–10.3)
Chloride: 107 mmol/L (ref 98–111)
Creatinine, Ser: 0.82 mg/dL (ref 0.44–1.00)
GFR calc Af Amer: >60 mL/min (ref >60)
GFR calc non Af Amer: >60 mL/min (ref >60)
Glucose, Bld: 111 mg/dL — ABNORMAL HIGH (ref 70–99)
Potassium: 4.3 mmol/L (ref 3.5–5.1)
Sodium: 139 mmol/L (ref 135–145)
Total Bilirubin: 0.9 mg/dL (ref 0.3–1.2)
Total Protein: 6.6 g/dL (ref 6.5–8.1)

## 2018-10-20 LAB — URINALYSIS, ROUTINE W REFLEX MICROSCOPIC
Bilirubin Urine: NEGATIVE
Glucose, UA: NEGATIVE mg/dL
Hgb urine dipstick: NEGATIVE
Ketones, ur: 5 mg/dL — AB
Leukocytes,Ua: NEGATIVE
Nitrite: NEGATIVE
Protein, ur: NEGATIVE mg/dL
Specific Gravity, Urine: 1.01 (ref 1.005–1.030)
pH: 6 (ref 5.0–8.0)

## 2018-10-20 LAB — TROPONIN I (HIGH SENSITIVITY)
Troponin I (High Sensitivity): 9 ng/L (ref <18)
Troponin I (High Sensitivity): 10 ng/L (ref <18)

## 2018-10-20 LAB — CBC WITH DIFFERENTIAL/PLATELET
Abs Immature Granulocytes: 0.03 10*3/uL (ref 0.00–0.07)
Basophils Absolute: 0 10*3/uL (ref 0.0–0.1)
Basophils Relative: 0 %
Eosinophils Absolute: 0.1 10*3/uL (ref 0.0–0.5)
Eosinophils Relative: 1 %
HCT: 47.3 % — ABNORMAL HIGH (ref 36.0–46.0)
Hemoglobin: 15.6 g/dL — ABNORMAL HIGH (ref 12.0–15.0)
Immature Granulocytes: 0 %
Lymphocytes Relative: 12 %
Lymphs Abs: 0.9 10*3/uL (ref 0.7–4.0)
MCH: 29.9 pg (ref 26.0–34.0)
MCHC: 33 g/dL (ref 30.0–36.0)
MCV: 90.8 fL (ref 80.0–100.0)
Monocytes Absolute: 0.5 10*3/uL (ref 0.1–1.0)
Monocytes Relative: 8 %
Neutro Abs: 5.7 10*3/uL (ref 1.7–7.7)
Neutrophils Relative %: 79 %
Platelets: 130 10*3/uL — ABNORMAL LOW (ref 150–400)
RBC: 5.21 MIL/uL — ABNORMAL HIGH (ref 3.87–5.11)
RDW: 14.3 % (ref 11.5–15.5)
WBC: 7.2 10*3/uL (ref 4.0–10.5)
nRBC: 0 % (ref 0.0–0.2)

## 2018-10-20 LAB — MAGNESIUM: Magnesium: 1.9 mg/dL (ref 1.7–2.4)

## 2018-10-20 LAB — ECHOCARDIOGRAM COMPLETE

## 2018-10-20 LAB — TSH: TSH: 2.35 u[IU]/mL (ref 0.350–4.500)

## 2018-10-20 LAB — SARS CORONAVIRUS 2 BY RT PCR (HOSPITAL ORDER, PERFORMED IN ~~LOC~~ HOSPITAL LAB): SARS Coronavirus 2: NEGATIVE

## 2018-10-20 SURGERY — PACEMAKER IMPLANT

## 2018-10-20 MED ORDER — SODIUM CHLORIDE 0.9 % IV SOLN
80.0000 mg | INTRAVENOUS | Status: AC
  Administered 2018-10-20: 80 mg

## 2018-10-20 MED ORDER — ACETAMINOPHEN 325 MG PO TABS
325.0000 mg | ORAL_TABLET | ORAL | Status: DC | PRN
  Administered 2018-10-20: 650 mg via ORAL
  Administered 2018-10-21: 325 mg via ORAL
  Filled 2018-10-20 (×2): qty 2

## 2018-10-20 MED ORDER — SODIUM CHLORIDE 0.9 % IV SOLN
INTRAVENOUS | Status: DC | PRN
  Administered 2018-10-20: 250 mL via INTRAVENOUS

## 2018-10-20 MED ORDER — OMEGA-3-ACID ETHYL ESTERS 1 G PO CAPS
2.0000 g | ORAL_CAPSULE | Freq: Every day | ORAL | Status: DC
  Administered 2018-10-20 – 2018-10-21 (×2): 2 g via ORAL
  Filled 2018-10-20 (×2): qty 2

## 2018-10-20 MED ORDER — LIDOCAINE HCL 1 % IJ SOLN
INTRAMUSCULAR | Status: AC
  Filled 2018-10-20: qty 60

## 2018-10-20 MED ORDER — CEFAZOLIN SODIUM-DEXTROSE 2-4 GM/100ML-% IV SOLN
2.0000 g | INTRAVENOUS | Status: AC
  Administered 2018-10-20: 2 g via INTRAVENOUS

## 2018-10-20 MED ORDER — OMEGA-3 FATTY ACIDS 1000 MG PO CAPS: 2.0000 g | ORAL_CAPSULE | Freq: Every day | ORAL | Status: DC

## 2018-10-20 MED ORDER — ONDANSETRON HCL 4 MG/2ML IJ SOLN: 4.0000 mg | Freq: Four times a day (QID) | INTRAMUSCULAR | Status: DC | PRN

## 2018-10-20 MED ORDER — CEFAZOLIN SODIUM-DEXTROSE 1-4 GM/50ML-% IV SOLN
1.0000 g | Freq: Four times a day (QID) | INTRAVENOUS | Status: AC
  Administered 2018-10-20 – 2018-10-21 (×3): 1 g via INTRAVENOUS
  Filled 2018-10-20 (×3): qty 50

## 2018-10-20 MED ORDER — LIDOCAINE HCL (PF) 1 % IJ SOLN
INTRAMUSCULAR | Status: DC | PRN
  Administered 2018-10-20: 18:00:00 40 mL

## 2018-10-20 MED ORDER — SIMVASTATIN 20 MG PO TABS
20.0000 mg | ORAL_TABLET | Freq: Every evening | ORAL | Status: DC
  Administered 2018-10-20: 20 mg via ORAL
  Filled 2018-10-20: qty 1

## 2018-10-20 MED ORDER — AMLODIPINE BESYLATE 2.5 MG PO TABS
5.0000 mg | ORAL_TABLET | Freq: Every day | ORAL | Status: DC
  Administered 2018-10-20 – 2018-10-21 (×2): 5 mg via ORAL
  Filled 2018-10-20 (×2): qty 2

## 2018-10-20 MED ORDER — LORAZEPAM 0.5 MG PO TABS
0.2500 mg | ORAL_TABLET | Freq: Every day | ORAL | Status: DC
  Administered 2018-10-20: 0.25 mg via ORAL
  Filled 2018-10-20: qty 1

## 2018-10-20 MED ORDER — CITALOPRAM HYDROBROMIDE 20 MG PO TABS
40.0000 mg | ORAL_TABLET | Freq: Every day | ORAL | Status: DC
  Administered 2018-10-20 – 2018-10-21 (×2): 40 mg via ORAL
  Filled 2018-10-20 (×2): qty 2

## 2018-10-20 MED ORDER — HYDROCHLOROTHIAZIDE 25 MG PO TABS
25.0000 mg | ORAL_TABLET | Freq: Every day | ORAL | Status: DC
  Administered 2018-10-20 – 2018-10-21 (×2): 25 mg via ORAL
  Filled 2018-10-20 (×2): qty 1

## 2018-10-20 MED ORDER — SODIUM CHLORIDE 0.9 % IV BOLUS
500.0000 mL | Freq: Once | INTRAVENOUS | Status: AC
  Administered 2018-10-20: 500 mL via INTRAVENOUS

## 2018-10-20 MED ORDER — CEFAZOLIN SODIUM-DEXTROSE 2-4 GM/100ML-% IV SOLN
INTRAVENOUS | Status: AC
  Filled 2018-10-20: qty 100

## 2018-10-20 MED ORDER — MIDAZOLAM HCL 5 MG/5ML IJ SOLN
INTRAMUSCULAR | Status: DC | PRN
  Administered 2018-10-20 (×2): 1 mg via INTRAVENOUS

## 2018-10-20 MED ORDER — CHLORHEXIDINE GLUCONATE 4 % EX LIQD: 60.0000 mL | Freq: Once | CUTANEOUS | Status: DC

## 2018-10-20 MED ORDER — SODIUM CHLORIDE 0.9 % IV SOLN
INTRAVENOUS | Status: DC
  Administered 2018-10-20: 16:00:00 via INTRAVENOUS

## 2018-10-20 MED ORDER — HEPARIN (PORCINE) IN NACL 1000-0.9 UT/500ML-% IV SOLN
INTRAVENOUS | Status: DC | PRN
  Administered 2018-10-20: 500 mL

## 2018-10-20 MED ORDER — MIRTAZAPINE 15 MG PO TABS
15.0000 mg | ORAL_TABLET | Freq: Every day | ORAL | Status: DC
  Administered 2018-10-20: 15 mg via ORAL
  Filled 2018-10-20: qty 1

## 2018-10-20 MED ORDER — MIDAZOLAM HCL 5 MG/5ML IJ SOLN
INTRAMUSCULAR | Status: AC
  Filled 2018-10-20: qty 5

## 2018-10-20 MED ORDER — HEPARIN (PORCINE) IN NACL 1000-0.9 UT/500ML-% IV SOLN
INTRAVENOUS | Status: AC
  Filled 2018-10-20: qty 500

## 2018-10-20 MED ORDER — VITAMIN E 180 MG (400 UNIT) PO CAPS
400.0000 [IU] | ORAL_CAPSULE | Freq: Every day | ORAL | Status: DC
  Administered 2018-10-20 – 2018-10-21 (×2): 400 [IU] via ORAL
  Filled 2018-10-20 (×2): qty 1

## 2018-10-20 MED ORDER — FENTANYL CITRATE (PF) 100 MCG/2ML IJ SOLN
INTRAMUSCULAR | Status: AC
  Filled 2018-10-20: qty 2

## 2018-10-20 MED ORDER — SODIUM CHLORIDE 0.9 % IV SOLN
INTRAVENOUS | Status: AC
  Filled 2018-10-20: qty 2

## 2018-10-20 SURGICAL SUPPLY — 10 items
CABLE SURGICAL S-101-97-12 (CABLE) ×5 IMPLANT
HEMOSTAT SURGICEL 2X4 FIBR (HEMOSTASIS) ×2 IMPLANT
IPG PACE AZUR XT DR MRI W1DR01 (Pacemaker) IMPLANT
KIT MICROPUNCTURE NIT STIFF (SHEATH) ×2 IMPLANT
LEAD ISOFLEX OPT 1944-52CM (Lead) ×2 IMPLANT
LEAD ISOFLEX OPT 1948-58CM (Lead) ×2 IMPLANT
PACE AZURE XT DR MRI W1DR01 (Pacemaker) ×3 IMPLANT
PAD PRO RADIOLUCENT 2001M-C (PAD) ×3 IMPLANT
SHEATH 7FR PRELUDE SNAP 13 (SHEATH) ×4 IMPLANT
TRAY PACEMAKER INSERTION (PACKS) ×3 IMPLANT

## 2018-10-20 NOTE — ED Notes (Signed)
Echo competed

## 2018-10-20 NOTE — ED Notes (Signed)
US remains at bedside

## 2018-10-20 NOTE — Plan of Care (Signed)
  Problem: Education: Goal: Knowledge of General Education information will improve Description Including pain rating scale, medication(s)/side effects and non-pharmacologic comfort measures Outcome: Progressing   Problem: Activity: Goal: Risk for activity intolerance will decrease Outcome: Progressing   Problem: Safety: Goal: Ability to remain free from injury will improve Outcome: Progressing   

## 2018-10-20 NOTE — Progress Notes (Signed)
HAS SEVERE AORTIC STENOSIS--new diagnosis with complete heart block  We have paced the patient and will check in am but anticpate general cardiology will be better able to pursue the next phases of her investigations and therapies

## 2018-10-20 NOTE — H&P (Addendum)
H&P   Patient ID: [name] MRN: [id number]; DOB: [birth year]  Admit date: 10/20/2018 Date of Consult: 10/20/2018  Primary Care Provider: Leonard Downing, MD Primary Cardiologist: None Primary Electrophysiologist:  None   Patient Profile:   [name] is a 83 y.o. female with a hx of HTN, remote uterine cancer, ongoing smoker, anxiety (currently weaning off her citalopram) who is being seen today for the evaluation of advanced heart block  History of Present Illness:   [name] reports a week of occasional dizzy spells, some that have required her to sit but passes, today these got more frequent and seemed worse or more sever, though no syncope. She denies any CP, palpitations or SOB.  Up to this past week and dizzy spells, no changes to her exertional capacity  LABS K+ 4.3 Mag 1.9 BUN/Creat 16/0.82 WBC 7.2 H/H 15/47 Plts 130 TSH 2.350  CXR with NAD  COVID pending Pt denies any symptoms of illness,no fever, no sick contact or recent travel    Heart Pathway Score:     Past Medical History:  Diagnosis Date  . Abnormal Pap smear 11/15/09  . Anxiety    Stable  . ASCUS on Pap smear 11/15/09  . Depression   . Endometrial polyp 11/15/2009  . Endometrioid adenocarcinoma   . H/O varicella   . Hemorrhoids   . History of measles, mumps, or rubella   . Hypercholesterolemia   . Hypertension   . Menopausal symptoms 06/13/10  . Ovarian cyst 11/15/09  . PMB (postmenopausal bleeding) 11/15/09  . Vaginal bleeding     Past Surgical History:  Procedure Laterality Date  . BREAST SURGERY     Over twenty - five  years ago  . CERVICAL BIOPSY    . LAPAROSCOPIC ASSISTED VAGINAL HYSTERECTOMY  12/2009   BSO     Home Medications:  Prior to Admission medications   Medication Sig Start Date End Date Taking? Authorizing Provider  amLODipine (NORVASC) 5 MG tablet Take 5 mg by mouth daily.    [provider]  citalopram (CELEXA) 40 MG tablet Take 40 mg by mouth  daily.    [provider]  fish oil-omega-3 fatty acids 1000 MG capsule Take 2 g by mouth daily.    [provider]  hydrochlorothiazide (HYDRODIURIL) 25 MG tablet Take 25 mg by mouth daily.    [provider]  LORazepam (ATIVAN) 0.5 MG tablet Take 0.25 mg by mouth at bedtime.    [provider]  mirtazapine (REMERON) 15 MG tablet Take 15 mg by mouth at bedtime.    [provider]  simvastatin (ZOCOR) 20 MG tablet Take 20 mg by mouth every evening.    [provider]  Vitamin D, Ergocalciferol, (DRISDOL) 50000 UNITS CAPS Take 50,000 Units by mouth.    [provider]  vitamin E 400 UNIT capsule Take 400 Units by mouth daily.    [provider]    Inpatient Medications: Scheduled Meds:  Continuous Infusions:  PRN Meds:   Allergies:   No Known Allergies  Social History:   Social History   Socioeconomic History  . Marital status: Legally Separated    Spouse name: Not on file  . Number of children: Not on file  . Years of education: Not on file  . Highest education level: Not on file  Occupational History  . Not on file  Social Needs  . Financial resource strain: Not on file  . Food insecurity  Worry: Not on file    Inability: Not on file  . Transportation needs    Medical: Not on file    Non-medical: Not on file  Tobacco Use  . Smoking status: Current Every Day Smoker    Packs/day: 0.50    Years: 20.00    Pack years: 10.00    Types: Cigarettes  . Smokeless tobacco: Never Used  Substance and Sexual Activity  . Alcohol use: No  . Drug use: Not on file  . Sexual activity: Not on file  Lifestyle  . Physical activity    Days per week: Not on file    Minutes per session: Not on file  . Stress: Not on file  Relationships  . Social Herbalist on phone: Not on file    Gets together: Not on file    Attends religious service: Not on file    Active member of club or organization: Not on  file    Attends meetings of clubs or organizations: Not on file    Relationship status: Not on file  . Intimate partner violence    Fear of current or ex partner: Not on file    Emotionally abused: Not on file    Physically abused: Not on file    Forced sexual activity: Not on file  Other Topics Concern  . Not on file  Social History Narrative  . Not on file    Family History:   Family History  Problem Relation Age of Onset  . Lung cancer Brother      ROS:  Please see the history of present illness.  All other ROS reviewed and negative.     Physical Exam/Data:   Vitals:   10/20/18 1245 10/20/18 1300 10/20/18 1315 10/20/18 1330  BP: (!) 163/60 (!) 151/78 (!) 151/63 (!) 163/74  Pulse: (!) 46 (!) 47 (!) 45 90  Resp: 17 14 (!) 21 15  SpO2: 98% 99% 96% 99%   No intake or output data in the 24 hours ending 10/20/18 1358 Last 3 Weights 11/13/2011 06/12/2011  Weight (lbs) 116 lb 120 lb 8 oz  Weight (kg) 52.617 kg 54.658 kg     There is no height or weight on file to calculate BMI.  General:  Well nourished, well developed, in no acute distress HEENT: normal Lymph: no adenopathy Neck: no JVD carotids delayed  Endocrine:  No thryomegaly Vascular: No carotid bruits  Cardiac:  RRR; 2/6SM, gallops or rubs S2 single Lungs:  CTA b/l, no wheezing, rhonchi or rales  Abd: soft, nontender, no hepatomegaly  Ext: no edema Musculoskeletal:  No deformities, BUE and BLE strength normal and equal Skin: warm and dry  Neuro:  No gross focal abnormalities noted Psych:  Normal affect   EKG:  The EKG was personally reviewed and demonstrates:   SR w/2:1 AVblock, V rate 47bpm, QRS 61ms Telemetry:  Telemetry was personally reviewed and demonstrates:    SR with periods of 2;1 AVblock she has had a couple V standstill/CHB episodes 4-6 seconds (symptomatic)  Relevant CV Studies:  No historical cardiac data  Laboratory Data:  High Sensitivity Troponin:   Recent Labs  Lab 10/20/18 1303   TROPONINIHS 9     Cardiac EnzymesNo results for input(s): TROPONINI in the last 168 hours. No results for input(s): TROPIPOC in the last 168 hours.  Chemistry Recent Labs  Lab 10/20/18 1303  NA 139  K 4.3  CL 107  CO2 24  GLUCOSE 111*  BUN 16  CREATININE 0.82  CALCIUM 9.2  GFRNONAA >60  GFRAA >60  ANIONGAP 8    Recent Labs  Lab 10/20/18 1303  PROT 6.6  ALBUMIN 3.8  AST 45*  ALT 22  ALKPHOS 74  BILITOT 0.9   Hematology Recent Labs  Lab 10/20/18 1303  WBC 7.2  RBC 5.21*  HGB 15.6*  HCT 47.3*  MCV 90.8  MCH 29.9  MCHC 33.0  RDW 14.3  PLT 130*   BNPNo results for input(s): BNP, PROBNP in the last 168 hours.  DDimer No results for input(s): DDIMER in the last 168 hours.   Radiology/Studies:   Dg Chest Portable 1 View Result Date: 10/20/2018 CLINICAL DATA:  Shortness of breath.  Cough. EXAM: PORTABLE CHEST 1 VIEW COMPARISON:  Radiographs of February 19, 2010. FINDINGS: The heart size and mediastinal contours are within normal limits. Atherosclerosis of thoracic aorta is noted. No pneumothorax or pleural effusion is noted. Right lung is clear. No acute pulmonary disease is noted. The visualized skeletal structures are unremarkable. IMPRESSION: No active disease. Aortic Atherosclerosis (ICD10-I70.0). Electronically Signed   By: Marijo Conception M.D.   On: 10/20/2018 13:43    Assessment and Plan:   1. Advanced heart block, 2:1 AVblock     TSH is pending     amlodipine not likely contributing     BP stable      She will need PPM Do not anticipate abnormal TSH Stat covid test Stat echo  I discussed with the patient PPM, rational for it Discussed the implant procedure it's potential risks and benefits. She is agreeable to proceed.   2. HTN     Resume med post implant  I have discussed the case with Dr. Caryl Comes He will see the patient   Zoll pads to patient ordered     For questions or updates, please contact [facility] Please consult  [email or web address] for contact info under     Signed, Baldwin [place], PA-C  10/20/2018 1:58 PM   Advance 2 degree heart block with intermittient complete heart block  Syncope  HTN  Aortic stenosis   Needs pacing for symptomatic intermittent complete heart block  The benefits and risks were reviewed including but not limited to death,  perforation, infection, lead dislodgement and device malfunction.  The patient understands agrees and is willing to proceed.  Conduction with narrow QRS  Await echo regarding LV function and degree of aortic stenosis

## 2018-10-20 NOTE — Progress Notes (Signed)
  Echocardiogram 2D Echocardiogram has been performed.  Greene Diodato G Killian Schwer 10/20/2018, 4:55 PM

## 2018-10-20 NOTE — ED Notes (Signed)
US at bedside

## 2018-10-20 NOTE — ED Provider Notes (Signed)
[facility] EMERGENCY DEPARTMENT Provider Note   CSN: [id number] Arrival date & time: 10/20/18  1219     History   Chief Complaint Chief Complaint  Patient presents with  . Bradycardia    HPI [name] is a 83 y.o. female.     The history is provided by the patient, medical records and the EMS personnel. No language interpreter was used.  Near Syncope This is a new problem. The current episode started more than 1 week ago. The problem has not changed since onset.Pertinent negatives include no chest pain, no abdominal pain, no headaches and no shortness of breath. The symptoms are aggravated by exertion. Nothing relieves the symptoms. She has tried nothing for the symptoms. The treatment provided no relief.    Past Medical History:  Diagnosis Date  . Abnormal Pap smear 11/15/09  . Anxiety    Stable  . ASCUS on Pap smear 11/15/09  . Depression   . Endometrial polyp 11/15/2009  . Endometrioid adenocarcinoma   . H/O varicella   . Hemorrhoids   . History of measles, mumps, or rubella   . Hypercholesterolemia   . Hypertension   . Menopausal symptoms 06/13/10  . Ovarian cyst 11/15/09  . PMB (postmenopausal bleeding) 11/15/09  . Vaginal bleeding     Patient Active Problem List   Diagnosis Date Noted  . Endometrial cancer ([place]) 06/12/2011    Past Surgical History:  Procedure Laterality Date  . BREAST SURGERY     Over twenty - five  years ago  . CERVICAL BIOPSY    . LAPAROSCOPIC ASSISTED VAGINAL HYSTERECTOMY  12/2009   BSO     OB History   No obstetric history on file.      Home Medications    Prior to Admission medications   Medication Sig Start Date End Date Taking? Authorizing Provider  amLODipine (NORVASC) 5 MG tablet Take 5 mg by mouth daily.    [provider]  citalopram (CELEXA) 40 MG tablet Take 40 mg by mouth daily.    [provider]  fish oil-omega-3 fatty acids 1000 MG capsule Take 2 g by mouth daily.     [provider]  hydrochlorothiazide (HYDRODIURIL) 25 MG tablet Take 25 mg by mouth daily.    [provider]  LORazepam (ATIVAN) 0.5 MG tablet Take 0.25 mg by mouth at bedtime.    [provider]  mirtazapine (REMERON) 15 MG tablet Take 15 mg by mouth at bedtime.    [provider]  simvastatin (ZOCOR) 20 MG tablet Take 20 mg by mouth every evening.    [provider]  Vitamin D, Ergocalciferol, (DRISDOL) 50000 UNITS CAPS Take 50,000 Units by mouth.    [provider]  vitamin E 400 UNIT capsule Take 400 Units by mouth daily.    [provider]    Family History Family History  Problem Relation Age of Onset  . Lung cancer Brother     Social History Social History   Tobacco Use  . Smoking status: Current Every Day Smoker    Packs/day: 0.50    Years: 20.00    Pack years: 10.00    Types: Cigarettes  . Smokeless tobacco: Never Used  Substance Use Topics  . Alcohol use: No  . Drug use: Not on file     Allergies   Patient has no known allergies.   Review of Systems Review of Systems  Constitutional: Positive for fatigue. Negative for chills, diaphoresis and  fever.  Eyes: Negative for visual disturbance.  Respiratory: Negative for chest tightness, shortness of breath and wheezing.   Cardiovascular: Positive for near-syncope. Negative for chest pain.  Gastrointestinal: Positive for diarrhea. Negative for abdominal pain, constipation, nausea and vomiting.  Genitourinary: Negative for dysuria.  Musculoskeletal: Negative for back pain, neck pain and neck stiffness.  Skin: Negative for rash and wound.  Neurological: Positive for light-headedness. Negative for dizziness and headaches.     Physical Exam Updated Vital Signs BP (!) 163/74   Pulse 90   Resp 15   SpO2 99%   Physical Exam Vitals signs and nursing note reviewed.  Constitutional:      General: She is not in acute distress.    Appearance: She is  well-developed. She is not ill-appearing, toxic-appearing or diaphoretic.  HENT:     Head: Normocephalic and atraumatic.     Mouth/Throat:     Pharynx: No oropharyngeal exudate or posterior oropharyngeal erythema.  Eyes:     Conjunctiva/sclera: Conjunctivae normal.     Pupils: Pupils are equal, round, and reactive to light.  Neck:     Musculoskeletal: Neck supple.  Cardiovascular:     Rate and Rhythm: Bradycardia present. Rhythm irregular.     Heart sounds: No murmur.  Pulmonary:     Effort: Pulmonary effort is normal. No respiratory distress.     Breath sounds: Normal breath sounds. No wheezing, rhonchi or rales.  Chest:     Chest wall: No tenderness.  Abdominal:     General: Abdomen is flat.     Palpations: Abdomen is soft.     Tenderness: There is no abdominal tenderness.  Musculoskeletal:        General: No tenderness.  Skin:    General: Skin is warm and dry.     Capillary Refill: Capillary refill takes less than 2 seconds.  Neurological:     Mental Status: She is alert.  Psychiatric:        Mood and Affect: Mood normal.      ED Treatments / Results  Labs (all labs ordered are listed, but only abnormal results are displayed) Labs Reviewed  CBC WITH DIFFERENTIAL/PLATELET - Abnormal; Notable for the following components:      Result Value   RBC 5.21 (*)    Hemoglobin 15.6 (*)    HCT 47.3 (*)    Platelets 130 (*)    All other components within normal limits  COMPREHENSIVE METABOLIC PANEL - Abnormal; Notable for the following components:   Glucose, Bld 111 (*)    AST 45 (*)    All other components within normal limits  URINALYSIS, ROUTINE W REFLEX MICROSCOPIC - Abnormal; Notable for the following components:   Ketones, ur 5 (*)    All other components within normal limits  URINE CULTURE  SARS CORONAVIRUS 2 (HOSPITAL ORDER, PERFORMED IN [facility])  SURGICAL PCR SCREEN  TSH  MAGNESIUM  TROPONIN I (HIGH SENSITIVITY)  TROPONIN I (HIGH SENSITIVITY)     EKG EKG Interpretation  Date/Time:  Wednesday October 20 2018 12:28:25 EDT Ventricular Rate:  47 PR Interval:    QRS Duration: 90 QT Interval:  559 QTC Calculation: 495 R Axis:   73 Text Interpretation:  bradycardia Prolonged PR interval Consider left atrial enlargement Borderline prolonged QT interval possible 2:1 block.  No STEMI Confirmed by Antony Blackbird [phone]) on 10/20/2018 1:15:42 PM   Radiology Dg Chest Portable 1 View  Result Date: 10/20/2018 CLINICAL DATA:  Shortness of breath.  Cough.  EXAM: PORTABLE CHEST 1 VIEW COMPARISON:  Radiographs of February 19, 2010. FINDINGS: The heart size and mediastinal contours are within normal limits. Atherosclerosis of thoracic aorta is noted. No pneumothorax or pleural effusion is noted. Right lung is clear. No acute pulmonary disease is noted. The visualized skeletal structures are unremarkable. IMPRESSION: No active disease. Aortic Atherosclerosis (ICD10-I70.0). Electronically Signed   By: Marijo Conception M.D.   On: 10/20/2018 13:43    Procedures Procedures (including critical care time)  Medications Ordered in ED Medications  0.9 %  sodium chloride infusion ( Intravenous Transfusing/Transfer 10/20/18 1535)  gentamicin (GARAMYCIN) 80 mg in sodium chloride 0.9 % 500 mL irrigation (has no administration in time range)  chlorhexidine (HIBICLENS) 4 % liquid 4 application (has no administration in time range)  chlorhexidine (HIBICLENS) 4 % liquid 4 application (has no administration in time range)  ceFAZolin (ANCEF) IVPB 2g/100 mL premix (has no administration in time range)  sodium chloride 0.9 % bolus 500 mL (0 mLs Intravenous Stopped 10/20/18 1522)     Initial Impression / Assessment and Plan / ED Course  I have reviewed the triage vital signs and the nursing notes.  Pertinent labs & imaging results that were available during my care of the patient were reviewed by me and considered in my medical decision making (see chart for  details).        ALECHIA LEZAMA is a 83 y.o. female with a past medical history significant for hypertension, hypercholesterolemia, depression, anxiety, and prior endometrial cancer who presents with lightheadedness, fatigue, and near syncopal episodes.  Patient reports that for the last week she has had episodes of nearly passing out and feeling very lightheaded.  She reports it is worsened with exertion.  She denies any history of this.  She reports occasional palpitations but denies a current palpitations.  She denies any chest pain.  She denies significant shortness of breath but does report a mild cough.  She denies any fevers, chills, nausea, vomiting, urinary or GI symptoms.  She reports she has been weaning off of citalopram recently.  EMS reports the patient been bradycardic in route.  EKG on arrival shows concern for a heart block with a Mobitz 2 or 2:1 block.  Cardiology was quickly called who agreed with the abnormal EKG.  They will send the electrophysiology team to evaluate the patient.  Patient will have screening labs and will likely require admission for symptomatic bradycardia causing her symptoms.  Labs returned showing normal magnesium and normal troponin.  CBC shows no leukocytosis and slight elevation in hemoglobin.  Does reports of recent diarrhea, possibly she is slightly dehydrated.  Metabolic panel overall reassuring.  Urinalysis shows no UTI.  Chest x-ray showed no pneumonia.  Cardiology will see patient and will likely admit for further management of the arrhythmia and symptomatic bradycardia.  Final Clinical Impressions(s) / ED Diagnoses   Final diagnoses:  Symptomatic bradycardia    ED Discharge Orders    None     Clinical Impression: 1. Symptomatic bradycardia     Disposition: Admit  This note was prepared with assistance of Dragon voice recognition software. Occasional wrong-word or sound-a-like substitutions may have occurred due to the inherent  limitations of voice recognition software.     Kaled Allende, Gwenyth Allegra, MD 10/20/18 1539

## 2018-10-20 NOTE — ED Notes (Signed)
Cards at bedside

## 2018-10-20 NOTE — Telephone Encounter (Signed)
Left message to call office-Patient needs to schedule an echocardiogram.  

## 2018-10-20 NOTE — ED Triage Notes (Signed)
[facility] reports pt has had dizzy and clammy spells x1 week. She is weaning of citalopram and took her last third of a pil yesterday. Her bp has been higher this week in 150's-160's( once at 208/ ) and is normally in 140's. Current smoker with cough for months with white sputum. At 96% RA, placed on 2lpm for comfort d/t mask. GEMS reports hr in 40's and irregular EKG.

## 2018-10-21 ENCOUNTER — Ambulatory Visit (HOSPITAL_COMMUNITY): Payer: Medicare HMO

## 2018-10-21 ENCOUNTER — Encounter (HOSPITAL_COMMUNITY): Payer: Self-pay | Admitting: Internal Medicine

## 2018-10-21 DIAGNOSIS — I442 Atrioventricular block, complete: Secondary | ICD-10-CM | POA: Diagnosis not present

## 2018-10-21 DIAGNOSIS — R69 Illness, unspecified: Secondary | ICD-10-CM | POA: Diagnosis not present

## 2018-10-21 DIAGNOSIS — E7800 Pure hypercholesterolemia, unspecified: Secondary | ICD-10-CM | POA: Diagnosis not present

## 2018-10-21 DIAGNOSIS — Z1159 Encounter for screening for other viral diseases: Secondary | ICD-10-CM | POA: Diagnosis not present

## 2018-10-21 DIAGNOSIS — F329 Major depressive disorder, single episode, unspecified: Secondary | ICD-10-CM | POA: Diagnosis not present

## 2018-10-21 DIAGNOSIS — Z45018 Encounter for adjustment and management of other part of cardiac pacemaker: Secondary | ICD-10-CM | POA: Diagnosis not present

## 2018-10-21 DIAGNOSIS — E785 Hyperlipidemia, unspecified: Secondary | ICD-10-CM

## 2018-10-21 DIAGNOSIS — F419 Anxiety disorder, unspecified: Secondary | ICD-10-CM | POA: Diagnosis not present

## 2018-10-21 DIAGNOSIS — I352 Nonrheumatic aortic (valve) stenosis with insufficiency: Secondary | ICD-10-CM | POA: Diagnosis not present

## 2018-10-21 DIAGNOSIS — I350 Nonrheumatic aortic (valve) stenosis: Secondary | ICD-10-CM

## 2018-10-21 DIAGNOSIS — R918 Other nonspecific abnormal finding of lung field: Secondary | ICD-10-CM | POA: Diagnosis not present

## 2018-10-21 DIAGNOSIS — R55 Syncope and collapse: Secondary | ICD-10-CM | POA: Diagnosis not present

## 2018-10-21 DIAGNOSIS — Z8542 Personal history of malignant neoplasm of other parts of uterus: Secondary | ICD-10-CM | POA: Diagnosis not present

## 2018-10-21 DIAGNOSIS — Z79899 Other long term (current) drug therapy: Secondary | ICD-10-CM | POA: Diagnosis not present

## 2018-10-21 DIAGNOSIS — I10 Essential (primary) hypertension: Secondary | ICD-10-CM | POA: Diagnosis not present

## 2018-10-21 LAB — URINE CULTURE

## 2018-10-21 MED FILL — Fentanyl Citrate Preservative Free (PF) Inj 100 MCG/2ML: INTRAMUSCULAR | Qty: 2 | Status: AC

## 2018-10-21 MED FILL — Lidocaine HCl Local Inj 1%: INTRAMUSCULAR | Qty: 40 | Status: AC

## 2018-10-21 NOTE — Discharge Summary (Addendum)
DISCHARGE SUMMARY    Patient ID: [name],  MRN: [id number], DOB/AGE: May 15, 1935 83 y.o.  Admit date: 10/20/2018 Discharge date: 10/21/2018  Primary Care Physician: Leonard Downing, MD  Primary Cardiologist: new, Dr. Harrington Challenger Electrophysiologist: new, Dr. Caryl Comes  Primary Discharge Diagnosis:  1. Recurrent near syncope 2. Advanced heart block including CHB 3. Severe AS  Secondary Discharge Diagnosis:  1. HTN 2. Smoker 3. Anxiety/depression  No Known Allergies   Procedures This Admission:  1.  Implantation of a MDT dual chamber PPM on 10/20/2018 by Dr Caryl Comes.  The patient received a  Medtronic  pulse generator.  Serial number [id number], St Jude 1948 ventricular lead, serial number [id number] and a   St Jude [id number]  atrial lead, serial number [id number] There were no immediate post procedure complications. 2.  CXR on 10/21/2018 demonstrated no pneumothorax status post device implantation.   Brief HPI: [name] is a 83 y.o. female sought medical attention for recurrent dizzy spells, escalating in the last couple days to near syncope.   She was found in the ER in 2:1 AVblock with periods of CHB/ventriculat standill as long as 6 seconds symptomatic with near syncope.  She had no reversible cause noted and recommended PPM implant   Hospital Course:  The patient was admitted TTE noted LVEF >65% and severe AS.  She underwent implantation of a PPM with details as outlined above. She was monitored on telemetry overnight which demonstrated SR predominantly, had transient V pacing overnight.  Left chest was without hematoma or ecchymosis.  The device was interrogated and found to be functioning normally.  CXR was obtained and demonstrated no pneumothorax status post device implantation.  Wound care, arm mobility, and restrictions were reviewed with the patient.  [name] has seen the patient today with plans for outpatient follow up and referral to TAVR team for her AS.  The patient  feels well this morning, no CP or SOB, she was examined by Dr. Caryl Comes and Dr. Harrington Challenger and considered stable for discharge to home.   The patient was in the process with her PMD of weaning her Celexa, discussed with her this, she will continue to manage this medicine as instructed by her PMD. Dr. Harrington Challenger also discussed with the patient warning signs/symptoms of symptomatic AS, avoiding exertional activities and the significant heat outdoors.    I have spoken with TAVR PA, she will arrange consultation/visit for the patient   Physical Exam: Vitals:   10/20/18 1911 10/21/18 0036 10/21/18 0600 10/21/18 0755  BP:  134/60 (!) 148/70 123/69  Pulse:  74 67 74  Resp:  17 17 18   Temp:  98.5 F (36.9 C) 98.5 F (36.9 C) 98 F (36.7 C)  TempSrc:  Oral Oral   SpO2:  91% 92% 96%  Weight: 51.5 kg  50.7 kg   Height: 5\' 2"  (1.575 m)       GEN- The patient is well appearing, alert and oriented x 3 today.   HEENT: normocephalic, atraumatic; sclera clear, conjunctiva pink; hearing intact; oropharynx clear; neck supple, no JVP Lungs- CTA b/l, normal work of breathing.  No wheezes, rales, rhonchi Heart- RRR, 2/6 SM, rubs or gallops, PMI not laterally displaced GI- soft, non-tender, non-distended Extremities- no clubbing, cyanosis, or edema MS- no significant deformity or atrophy Skin- warm and dry, no rash or lesion, left chest without hematoma/ecchymosis Psych- euthymic mood, full affect Neuro- no gross deficits   Labs:   Lab Results  Component Value  Date   WBC 7.2 10/20/2018   HGB 15.6 (H) 10/20/2018   HCT 47.3 (H) 10/20/2018   MCV 90.8 10/20/2018   PLT 130 (L) 10/20/2018    Recent Labs  Lab 10/20/18 1303  NA 139  K 4.3  CL 107  CO2 24  BUN 16  CREATININE 0.82  CALCIUM 9.2  PROT 6.6  BILITOT 0.9  ALKPHOS 74  ALT 22  AST 45*  GLUCOSE 111*    Discharge Medications:  Allergies as of 10/21/2018   No Known Allergies     Medication List    TAKE these medications   amLODipine 5  MG tablet Commonly known as: NORVASC Take 5 mg by mouth daily.   citalopram 40 MG tablet Commonly known as: CELEXA Take 40 mg by mouth daily. Notes to patient: Continue to takle as directed by your primary doctor as discussed   fish oil-omega-3 fatty acids 1000 MG capsule Take 2 g by mouth daily.   hydrochlorothiazide 25 MG tablet Commonly known as: HYDRODIURIL Take 25 mg by mouth daily.   LORazepam 0.5 MG tablet Commonly known as: ATIVAN Take 0.25 mg by mouth at bedtime.   mirtazapine 15 MG tablet Commonly known as: REMERON Take 15 mg by mouth at bedtime.   simvastatin 20 MG tablet Commonly known as: ZOCOR Take 20 mg by mouth every evening.   Vitamin D (Ergocalciferol) 1.25 MG (50000 UT) Caps capsule Commonly known as: DRISDOL Take 50,000 Units by mouth.   vitamin E 400 UNIT capsule Take 400 Units by mouth daily.       Disposition: Home  Discharge Instructions    Diet - low sodium heart healthy   Complete by: As directed    Increase activity slowly   Complete by: As directed      Follow-up Information    [facility] Follow up.   Specialty: Cardiology Why: 11/04/2018 @ 4:00PM, wound check visit (pacemaker) Contact information: [address] [place]       Deboraha Sprang, MD Follow up.   Specialty: Cardiology Why: 01/21/2019 @ 2:45PM (pacemaker specialist) Contact information: [address] [address] [phone]           Duration of Discharge Encounter: Greater than 30 minutes including physician time.  Venetia Night, PA-C 10/21/2018 10:08 AM   Device function normal    Instructions given  Arranged followup for her AS

## 2018-10-21 NOTE — Discharge Instructions (Signed)
° ° °  Supplemental Discharge Instructions for  Pacemaker/Defibrillator Patients  Activity No heavy lifting or vigorous activity with your left/right arm for 6 to 8 weeks.  Do not raise your left/right arm above your head for one week.  Gradually raise your affected arm as drawn below.              10/24/2018                10/25/2018                10/26/2018               10/27/2018 __  NO DRIVING for  1 week   ; you may begin driving on  11/24/784  .  WOUND CARE - Keep the wound area clean and dry.  Do not get this area wet, no showers for 24 hours; you may shower on  10/22/2018   . - No bandage is needed on the site.  DO  NOT apply any creams, oils, or ointments to the wound area. - If you notice any drainage or discharge from the wound, any swelling or bruising at the site, or you develop a fever > 101? F after you are discharged home, call the office at once.  Special Instructions - You are still able to use cellular telephones; use the ear opposite the side where you have your pacemaker/defibrillator.  Avoid carrying your cellular phone near your device. - When traveling through airports, show security personnel your identification card to avoid being screened in the metal detectors.  Ask the security personnel to use the hand wand. - Avoid arc welding equipment, MRI testing (magnetic resonance imaging), TENS units (transcutaneous nerve stimulators).  Call the office for questions about other devices. - Avoid electrical appliances that are in poor condition or are not properly grounded. - Microwave ovens are safe to be near or to operate.

## 2018-10-21 NOTE — Progress Notes (Signed)
Patient alert and oriented, mild pain to incision site 1 tylenol given, iv and tele removed. D/c instruction explain and given to the patient, all questions answered. Patient d/c home per order

## 2018-10-21 NOTE — Progress Notes (Signed)
Orthopedic Tech Progress Note Patient Details:  [name] 1936-02-09 [id number] RN said patient has on sling Patient ID: [name], female   DOB: [birth year], 83 y.o.   MRN: [id number]   [name] 10/21/2018, 8:22 AM

## 2018-10-21 NOTE — Progress Notes (Addendum)
Progress Note  Patient Name: [name] Date of Encounter: 10/21/2018  Primary Cardiologist: Dorris Carnes, MD , Dr. Caryl Comes  Subjective   Pt feels much better today after PPM placement, pocket site sore. Discussed her recent onset of DOE, she is very active at baseline  Inpatient Medications    Scheduled Meds: . amLODipine  5 mg Oral Daily  . citalopram  40 mg Oral Daily  . hydrochlorothiazide  25 mg Oral Daily  . LORazepam  0.25 mg Oral QHS  . mirtazapine  15 mg Oral QHS  . omega-3 acid ethyl esters  2 g Oral Daily  . simvastatin  20 mg Oral QPM  . vitamin E  400 Units Oral Daily   Continuous Infusions: . sodium chloride 250 mL (10/20/18 2323)  .  ceFAZolin (ANCEF) IV 1 g (10/21/18 0542)   PRN Meds: sodium chloride, acetaminophen, ondansetron (ZOFRAN) IV   Vital Signs    Vitals:   10/20/18 1911 10/21/18 0036 10/21/18 0600 10/21/18 0755  BP:  134/60 (!) 148/70 123/69  Pulse:  74 67 74  Resp:  17 17 18   Temp:  98.5 F (36.9 C) 98.5 F (36.9 C) 98 F (36.7 C)  TempSrc:  Oral Oral   SpO2:  91% 92% 96%  Weight: 51.5 kg  50.7 kg   Height: 5\' 2"  (1.575 m)       Intake/Output Summary (Last 24 hours) at 10/21/2018 0848 Last data filed at 10/21/2018 0300 Gross per 24 hour  Intake 1093.59 ml  Output -  Net 1093.59 ml   Last 3 Weights 10/21/2018 10/20/2018 11/13/2011  Weight (lbs) 111 lb 11.2 oz 113 lb 9.6 oz 116 lb  Weight (kg) 50.667 kg 51.529 kg 52.617 kg      Telemetry    Periods of sinus rhythm, 2:1 AV block, and pacing, HR 40-70s  - Personally Reviewed  ECG    NSR HR 70 - Personally Reviewed  Physical Exam   GEN: No acute distress.   Neck: No JVD Cardiac: regular rhythm, regular rate, 4/6 holosystolic murmur, do not hear S2, PPM pocket C/D/I with clear cover Respiratory: Clear to auscultation bilaterally. GI: Soft, nontender, non-distended  MS: No edema; No deformity. Neuro:  Nonfocal  Psych: Normal affect   Labs    High Sensitivity Troponin:    Recent Labs  Lab 10/20/18 1303 10/20/18 1457  TROPONINIHS 9 10      Cardiac EnzymesNo results for input(s): TROPONINI in the last 168 hours. No results for input(s): TROPIPOC in the last 168 hours.   Chemistry Recent Labs  Lab 10/20/18 1303  NA 139  K 4.3  CL 107  CO2 24  GLUCOSE 111*  BUN 16  CREATININE 0.82  CALCIUM 9.2  PROT 6.6  ALBUMIN 3.8  AST 45*  ALT 22  ALKPHOS 74  BILITOT 0.9  GFRNONAA >60  GFRAA >60  ANIONGAP 8     Hematology Recent Labs  Lab 10/20/18 1303  WBC 7.2  RBC 5.21*  HGB 15.6*  HCT 47.3*  MCV 90.8  MCH 29.9  MCHC 33.0  RDW 14.3  PLT 130*    BNPNo results for input(s): BNP, PROBNP in the last 168 hours.   DDimer No results for input(s): DDIMER in the last 168 hours.   Radiology    Dg Chest Portable 1 View  Result Date: 10/20/2018 CLINICAL DATA:  Shortness of breath.  Cough. EXAM: PORTABLE CHEST 1 VIEW COMPARISON:  Radiographs of February 19, 2010. FINDINGS: The heart  size and mediastinal contours are within normal limits. Atherosclerosis of thoracic aorta is noted. No pneumothorax or pleural effusion is noted. Right lung is clear. No acute pulmonary disease is noted. The visualized skeletal structures are unremarkable. IMPRESSION: No active disease. Aortic Atherosclerosis (ICD10-I70.0). Electronically Signed   By: Marijo Conception M.D.   On: 10/20/2018 13:43    Cardiac Studies   Echo 10/20/18:  1. The left ventricle has hyperdynamic systolic function, with an ejection fraction of >65%. The cavity size was normal. Left ventricular diastolic Doppler parameters are consistent with impaired relaxation.  2. The right ventricle has normal systolic function. The cavity was normal. There is no increase in right ventricular wall thickness.  3. There is mild mitral annular calcification present.  4. The aortic valve is tricuspid. Severely thickening of the aortic valve. Severe calcifcation of the aortic valve. Aortic valve regurgitation was not  assessed by color flow Doppler. Severe stenosis of the aortic valve.  5. Aortic valve peak velocity: 4.62m/s, mean gradient 41mmHg.  6. The aorta is normal in size and structure.  Patient Profile     83 y.o. female with a hx of HTN, remote uterine cancer, ongoing smoker, anxiety (currently weaning off her citalopram) who presented with dizzy spells and found to have high grade AV block 2:1 and periods of symptomatic CHB. EP admitted and  PPM placed. She is now pacing. Echo with severe aortic stenosis. Directed to general cardiology.  Assessment & Plan    1. 2:1 AV block with episodes of symptomatic complete heart block 2. Dual chamber pacemaker inserted 10/20/18 - St Jude - telemetry with periods of sinus rhythm, 2:1 heart block, and pacing - feels much better today, pocket site sore   3. Severe aortic stenosis - tricuspid valve with severe thickening and severe calcification - mean gradient 42 mmHg - discussed with patient - she has noticed increased DOE over the past 2-3 months and was waiting for a new patient evaluation with Dr. Claiborne Billings after murmur appreciated by PCP - she is very active, lives alone, does all ADL, including grocery shopping, no recent falls - discussed evaluation for TAVR vs sternotomy - no history of heart disease, but she does continue to smoke   4. Hypertension - home regimen includes 5 mg norvasc, 25 mg HCTZ   5. Hyperlipidemia - on zocor 20 mg - will collect updated lipid panel   6. Current smoker - she is adamant about quitting ASAP and understands that cessation is best for her heart health    For questions or updates, please contact [facility] Please consult [email or web address] for contact info under        Signed, Ledora Bottcher, PA  10/21/2018, 8:48 AM    Pt seen and examined   I agree with findnigs as noted by A Duke above  PT is comfortable   Denies dizziness LUngs:   CTA   Cardiac RRR   S1, S2  No S3  Gr II/VI systolic murmur  Base  Ext are without edema     1  Rhythm:   Pt is now s/p PPM for high grade AV block    WIll follow in clinic    2   AS   Echo yesterday shows severe AS   Mean gradient 76mm HG   She has been weak and dizzy  This may be related to 1 but could be due to AS as well    I do think she should be seen  inTAVR clinic after d/c to review, discuss Avoid dehydration  She is on amlodipine and HCTZ chronically   Follow IF she has dizziness, severe SOB or CP then call  3  HTN  As above  4   TOb  Counselled on cessation  OK to d/c home   Dorris Carnes MD

## 2018-10-22 ENCOUNTER — Other Ambulatory Visit: Payer: Self-pay

## 2018-10-22 ENCOUNTER — Ambulatory Visit (INDEPENDENT_AMBULATORY_CARE_PROVIDER_SITE_OTHER): Payer: Medicare HMO | Admitting: *Deleted

## 2018-10-22 ENCOUNTER — Telehealth: Payer: Self-pay

## 2018-10-22 DIAGNOSIS — I442 Atrioventricular block, complete: Secondary | ICD-10-CM

## 2018-10-22 MED ORDER — ACETAMINOPHEN 500 MG PO TABS: 500.0000 mg | ORAL_TABLET | Freq: Four times a day (QID) | ORAL | 0 refills | Status: DC | PRN

## 2018-10-22 NOTE — Telephone Encounter (Signed)
Pt has edema and redness at site of pacemaker wound. C/O 6/10 pain that has increased over the past 24 hrs. No drainage from site. Pt to be seen in [facility] today at 1530 for evaluation.      COVID-19 Pre-Screening Questions:   In the past 7 to 10 days have you had a cough,  shortness of breath, headache, congestion, fever (100 or greater) body aches, chills, sore throat, or sudden loss of taste or sense of smell?  Have you been around anyone with known Covid 19.  Have you been around anyone who is awaiting Covid 19 test results in the past 7 to 10 days?  Have you been around anyone who has been exposed to Covid 19, or has mentioned symptoms of Covid 19 within the past 7 to 10 days?  If you have any concerns/questions about symptoms patients report during screening (either on the phone or at threshold). Contact the provider seeing the patient or DOD for further guidance.  If neither are available contact a member of the leadership team.  Pt and niece answer no to all questions. Niece to accompany pt to appointment due to issues with mobility and dizziness.

## 2018-10-22 NOTE — Patient Instructions (Signed)
Apply ice pack to wound site for 20 minute intervals for swelling. May take Tylenol( Acetamenophen) 500 mg , 2 tablets twice a day for pain for pain per Dr Lovena Le. Call clinic if you develop a fever, drainage or increased swelling at wound site.Keep folow-up wound check appointment as scheduled.

## 2018-10-22 NOTE — Progress Notes (Signed)
Wound edges approximated , derma bond intact, no drainage from wound site, diffuse  edema present at site. C/O 6/10 pain at incision site. Dr Lovena Le assessed wound. Cold pack to be applied to wound site at 20 minute intervals and pat to take Tylenol 500 mg , 2 tablets twice a day prn for pain. Instructions given to contact office if she develops a fever, swelling increases, or drainage occurs from wound site. Keep f/u wound check in device clinic.

## 2018-10-22 NOTE — Telephone Encounter (Signed)
Pt niece states the pt is having a lot of pain. The pt pain is at a 4 when she is resting but when she do some movement the pain is at a 6. I let the pt talk to the device tech nurse Jenny Reichmann, RN

## 2018-10-28 DIAGNOSIS — I10 Essential (primary) hypertension: Secondary | ICD-10-CM | POA: Diagnosis not present

## 2018-10-28 NOTE — H&P (View-Only) (Signed)
Structural Heart Clinic Consult Note  Chief Complaint  Patient presents with  . 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 Caryl Comes for further discussion regarding her aortic stenosis and possible TAVR. She was admitted to [facility] 10/20/18 with advanced heart block (2:1 AV block with intermittent complete heart block) and a permanent pacemaker was placed on 10/20/18. Echo 10/20/18 with LVEF greater than 65%. The aortic valve leaflets are thickened and calcified with limited leaflet mobility consistent with severe aortic stenosis. (mean gradient 42 mmHg, peak gradient 67 mmHg, AVA 0.48 cm2, dimensionless index 0.21). She had an uneventful hospital course after her pacemaker was placed.   She tells me today that she has had ongoing dyspnea with exertion, mild dizziness. No chest pain. No LE edema. She is very active. She mows the grass. She sees a Pharmacist, community regularly. She has been having dental issues lately with some teeth falling out. She is going back to the dentist soon. She lives alone. She is retired from [employer].   Primary Care Physician: Leonard Downing, MD Primary Cardiologist: Caryl Comes Referring Cardiologist: Caryl Comes  Past Medical History:  Diagnosis Date  . Abnormal Pap smear 11/15/09  . Anxiety    Stable  . ASCUS on Pap smear 11/15/09  . Depression   . Endometrial polyp 11/15/2009  . Endometrioid adenocarcinoma   . H/O varicella   . Heart murmur   . Hemorrhoids   . History of measles, mumps, or rubella   . Hypercholesterolemia   . Hypertension   . Menopausal symptoms 06/13/10  . Ovarian cyst 11/15/09  . PMB (postmenopausal bleeding) 11/15/09  . Presence of permanent cardiac pacemaker 10/20/2018  . Vaginal  bleeding     Past Surgical History:  Procedure Laterality Date  . BREAST SURGERY     Over twenty - five  years ago  . CERVICAL BIOPSY    . INSERT / REPLACE / REMOVE PACEMAKER  10/20/2018  . LAPAROSCOPIC ASSISTED VAGINAL HYSTERECTOMY  12/2009   BSO  . PACEMAKER IMPLANT N/A 10/20/2018   Procedure: PACEMAKER IMPLANT;  Surgeon: Deboraha Sprang, MD;  Location: [facility];  Service: Cardiovascular;  Laterality: N/A;    Current Outpatient Medications  Medication Sig Dispense Refill  . amLODipine (NORVASC) 5 MG tablet Take 5 mg by mouth daily.    . citalopram (CELEXA) 40 MG tablet Take 40 mg by mouth daily.    . fish oil-omega-3 fatty acids 1000 MG capsule Take 2 g by mouth daily.    . mirtazapine (REMERON) 15 MG tablet Take 15 mg by mouth at bedtime.    . simvastatin (ZOCOR) 20 MG tablet Take 20 mg by mouth every evening.    . Vitamin D, Ergocalciferol, (DRISDOL) 50000 UNITS CAPS Take 50,000 Units by mouth.    . vitamin E 400 UNIT capsule Take 400 Units by mouth daily.     No current facility-administered medications for this visit.     No Known Allergies  Social History   Socioeconomic History  . Marital status: Legally Separated    Spouse name: Not on file  . Number of children: Not on file  . Years of education: Not on file  . Highest education level: Not on file  Occupational History  .  Occupation: Retired-sales rep  Social Needs  . Financial resource strain: Not on file  . Food insecurity    Worry: Not on file    Inability: Not on file  . Transportation needs    Medical: Not on file    Non-medical: Not on file  Tobacco Use  . Smoking status: Current Every Day Smoker    Packs/day: 0.50    Years: 55.00    Pack years: 27.50    Types: Cigarettes  . Smokeless tobacco: Never Used  Substance and Sexual Activity  . Alcohol use: No  . Drug use: Not Currently  . Sexual activity: Not on file  Lifestyle  . Physical activity    Days per week: Not on file     Minutes per session: Not on file  . Stress: Not on file  Relationships  . Social Herbalist on phone: Not on file    Gets together: Not on file    Attends religious service: Not on file    Active member of club or organization: Not on file    Attends meetings of clubs or organizations: Not on file    Relationship status: Not on file  . Intimate partner violence    Fear of current or ex partner: Not on file    Emotionally abused: Not on file    Physically abused: Not on file    Forced sexual activity: Not on file  Other Topics Concern  . Not on file  Social History Narrative  . Not on file    Family History  Problem Relation Age of Onset  . Lung cancer Brother   . Heart disease Brother   . Heart failure Mother   . Parkinson's disease Father     Review of Systems:  As stated in the HPI and otherwise negative.   BP 138/60   Pulse 78   Ht 5\' 2"  (1.575 m)   Wt 117 lb (53.1 kg)   SpO2 97%   BMI 21.40 kg/m   Physical Examination: General: Well developed, well nourished, NAD  HEENT: OP clear, mucus membranes moist  SKIN: warm, dry. No rashes. Neuro: No focal deficits  Musculoskeletal: Muscle strength 5/5 all ext  Psychiatric: Mood and affect normal  Neck: No JVD, no carotid bruits, no thyromegaly, no lymphadenopathy.  Lungs:Rhonci diffusely. No wheezes or crackles Cardiovascular: Regular rate and rhythm. Loud, harsh, late peaking systolic murmur.  Abdomen:Soft. Bowel sounds present. Non-tender.  Extremities: No lower extremity edema. Pulses are 2 + in the bilateral DP/PT.  EKG:  EKG is ordered today. The ekg ordered today demonstrates V paced rhythm  Echo July 2020:   1. The left ventricle has hyperdynamic systolic function, with an ejection fraction of >65%. The cavity size was normal. Left ventricular diastolic Doppler parameters are consistent with impaired relaxation.  2. The right ventricle has normal systolic function. The cavity was normal. There is  no increase in right ventricular wall thickness.  3. There is mild mitral annular calcification present.  4. The aortic valve is tricuspid. Severely thickening of the aortic valve. Severe calcifcation of the aortic valve. Aortic valve regurgitation was not assessed by color flow Doppler. Severe stenosis of the aortic valve.  5. Aortic valve peak velocity: 4.22m/s, mean gradient 19mmHg.  6. The aorta is normal in size and structure.  FINDINGS  Left Ventricle: The left ventricle has hyperdynamic systolic function, with an ejection fraction of >65%. The cavity size was normal. There is no increase  in left ventricular wall thickness. Left ventricular diastolic Doppler parameters are consistent  with impaired relaxation.  Right Ventricle: The right ventricle has normal systolic function. The cavity was normal. There is no increase in right ventricular wall thickness.  Left Atrium: Left atrial size was normal in size.  Right Atrium: Right atrial size was normal in size. Right atrial pressure is estimated at 3 mmHg.  Interatrial Septum: No atrial level shunt detected by color flow Doppler.  Pericardium: There is no evidence of pericardial effusion.  Mitral Valve: The mitral valve is normal in structure. There is mild mitral annular calcification present. Mitral valve regurgitation is mild by color flow Doppler.  Tricuspid Valve: The tricuspid valve is normal in structure. Tricuspid valve regurgitation is mild by color flow Doppler.  Aortic Valve: The aortic valve is tricuspid Severely thickening of the aortic valve. Severe calcifcation of the aortic valve. Aortic valve regurgitation was not assessed by color flow Doppler. There is Severe stenosis of the aortic valve, with a  calculated valve area of 0.48 cm. Aortic valve peak velocity: 4.37m/s, mean gradient 43mmHg.  Pulmonic Valve: The pulmonic valve was normal in structure. Pulmonic valve regurgitation is not visualized by color flow  Doppler.  Aorta: The aorta is normal in size and structure.  Venous: The inferior vena cava is normal in size with greater than 50% respiratory variability.    +--------------+--------++ LEFT VENTRICLE         +--------------+--------++ PLAX 2D                +--------------+--------++ LVIDd:        3.59 cm  +--------------+--------++ LVIDs:        2.53 cm  +--------------+--------++ LV PW:        0.93 cm  +--------------+--------++ LV IVS:       0.71 cm  +--------------+--------++ LVOT diam:    1.70 cm  +--------------+--------++ LV SV:        31 ml    +--------------+--------++ LV SV Index:  19.96    +--------------+--------++ LVOT Area:    2.27 cm +--------------+--------++                        +--------------+--------++  +---------------+----------++ RIGHT VENTRICLE           +---------------+----------++ RV Basal diam: 2.09 cm    +---------------+----------++ RV S prime:    13.10 cm/s +---------------+----------++ TAPSE (M-mode):2.3 cm     +---------------+----------++  +---------------+-------++-----------++ LEFT ATRIUM           Index       +---------------+-------++-----------++ LA diam:       2.80 cm1.76 cm/m  +---------------+-------++-----------++ LA Vol (A2C):  73.6 ml46.39 ml/m +---------------+-------++-----------++ LA Vol (A4C):  23.8 ml15.00 ml/m +---------------+-------++-----------++ LA Biplane Vol:45.6 ml28.74 ml/m +---------------+-------++-----------++ +------------+--------++-----------++ RIGHT ATRIUM        Index       +------------+--------++-----------++ RA Area:    9.78 cm            +------------+--------++-----------++ RA Volume:  18.90 ml11.91 ml/m +------------+--------++-----------++  +------------------+------------++ AORTIC VALVE                   +------------------+------------++ AV Area (Vmax):    0.56 cm     +------------------+------------++ AV Area (Vmean):  0.50 cm     +------------------+------------++ AV Area (VTI):    0.48 cm     +------------------+------------++ AV Vmax:          410.00 cm/s  +------------------+------------++ AV  Vmean:         276.750 cm/s +------------------+------------++ AV VTI:           1.045 m      +------------------+------------++ AV Peak Grad:     67.2 mmHg    +------------------+------------++ AV Mean Grad:     36.2 mmHg    +------------------+------------++ LVOT Vmax:        101.00 cm/s  +------------------+------------++ LVOT Vmean:       60.800 cm/s  +------------------+------------++ LVOT VTI:         0.219 m      +------------------+------------++ LVOT/AV VTI ratio:0.21         +------------------+------------++ AR PHT:           744 msec     +------------------+------------++   +-------------+-------++ AORTA                +-------------+-------++ Ao Root diam:2.60 cm +-------------+-------++  +--------------+----------++  +---------------+-----------++ MITRAL VALVE              TRICUSPID VALVE            +--------------+----------++  +---------------+-----------++ MV Area (PHT):2.95 cm    TR Peak grad:  27.4 mmHg   +--------------+----------++  +---------------+-----------++ MV PHT:       74.53 msec  TR Vmax:       267.00 cm/s +--------------+----------++  +---------------+-----------++ MV Decel Time:257 msec   +--------------+----------++  +--------------+-------+ +--------------+-----------++ SHUNTS                MV E velocity:96.10 cm/s  +--------------+-------+ +--------------+-----------++ Systemic VTI: 0.22 m  MV A velocity:137.00 cm/s +--------------+-------+ +--------------+-----------++ Systemic Diam:1.70 cm MV E/A ratio: 0.70        +--------------+-------+ +--------------+-----------++    Recent Labs: 10/20/2018: ALT 22; BUN 16; Creatinine, Ser 0.82; Hemoglobin 15.6; Magnesium 1.9; Platelets 130; Potassium 4.3; Sodium 139; TSH 2.350   Wt Readings from Last 3 Encounters:  11/01/18 117 lb (53.1 kg)  10/21/18 111 lb 11.2 oz (50.7 kg)  11/13/11 116 lb (52.6 kg)     Other studies Reviewed: Additional studies/ records that were reviewed today include: echo images, hospital notes.  Review of the above records demonstrates: severe AS   Assessment and Plan:   1. Severe Aortic Valve Stenosis: She has severe, stage D aortic valve stenosis. I have personally reviewed the echo images. The aortic valve is thickened, calcified with limited leaflet mobility. I think she would benefit from AVR. Given advanced age, she is not a good candidate for conventional AVR by surgical approach. I think she may be a good candidate for TAVR.   STS Risk Score: Risk of Mortality: 3.323% Renal Failure: 1.807% Permanent Stroke: 1.142% Prolonged Ventilation: 8.809% DSW Infection: 0.089% Reoperation: 3.191% Morbidity or Mortality: 13.176% Short Length of Stay: 28.155% Long Length of Stay: 6.069%  I have reviewed the natural history of aortic stenosis with the patient and their family members  who are present today. We have discussed the limitations of medical therapy and the poor prognosis associated with symptomatic aortic stenosis. We have reviewed potential treatment options, including palliative medical therapy, conventional surgical aortic valve replacement, and transcatheter aortic valve replacement. We discussed treatment options in the context of the patient's specific comorbid medical conditions.   She would like to proceed with planning for TAVR. I will arrange a right and left heart catheterization at [facility] 11/24/18 at 8:30 am. Risks and benefits of the cath procedure and the valve procedure are reviewed with the patient. After the cath, she  will have a cardiac CT, CTA of the chest/abdomen and pelvis,  carotid artery dopplers, PT assessment and will then be referred to see one of the CT surgeons on our TAVR team.   Current medicines are reviewed at length with the patient today.  The patient does not have concerns regarding medicines.  The following changes have been made:  no change  Labs/ tests ordered today include:   Orders Placed This Encounter  Procedures  . CBC  . Basic Metabolic Panel (BMET)     Disposition:   FU with the valve team   Signed, Lauree Chandler, MD 11/01/2018 9:59 AM    [address], [place], [place]  [address] Phone: [phone]; Fax: [phone]

## 2018-10-28 NOTE — Progress Notes (Signed)
Structural Heart Clinic Consult Note  Chief Complaint  Patient presents with  . 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 Caryl Comes for further discussion regarding her aortic stenosis and possible TAVR. She was admitted to [facility] 10/20/18 with advanced heart block (2:1 AV block with intermittent complete heart block) and a permanent pacemaker was placed on 10/20/18. Echo 10/20/18 with LVEF greater than 65%. The aortic valve leaflets are thickened and calcified with limited leaflet mobility consistent with severe aortic stenosis. (mean gradient 42 mmHg, peak gradient 67 mmHg, AVA 0.48 cm2, dimensionless index 0.21). She had an uneventful hospital course after her pacemaker was placed.   She tells me today that she has had ongoing dyspnea with exertion, mild dizziness. No chest pain. No LE edema. She is very active. She mows the grass. She sees a Pharmacist, community regularly. She has been having dental issues lately with some teeth falling out. She is going back to the dentist soon. She lives alone. She is retired from Press [employer].   Primary Care Physician: Leonard Downing, MD Primary Cardiologist: Caryl Comes Referring Cardiologist: Caryl Comes  Past Medical History:  Diagnosis Date  . Abnormal Pap smear 11/15/09  . Anxiety    Stable  . ASCUS on Pap smear 11/15/09  . Depression   . Endometrial polyp 11/15/2009  . Endometrioid adenocarcinoma   . H/O varicella   . Heart murmur   . Hemorrhoids   . History of measles, mumps, or rubella   . Hypercholesterolemia   . Hypertension   . Menopausal symptoms 06/13/10  . Ovarian cyst 11/15/09  . PMB (postmenopausal bleeding) 11/15/09  . Presence of permanent cardiac pacemaker 10/20/2018  . Vaginal  bleeding     Past Surgical History:  Procedure Laterality Date  . BREAST SURGERY     Over twenty - five  years ago  . CERVICAL BIOPSY    . INSERT / REPLACE / REMOVE PACEMAKER  10/20/2018  . LAPAROSCOPIC ASSISTED VAGINAL HYSTERECTOMY  12/2009   BSO  . PACEMAKER IMPLANT N/A 10/20/2018   Procedure: PACEMAKER IMPLANT;  Surgeon: Deboraha Sprang, MD;  Location: [facility];  Service: Cardiovascular;  Laterality: N/A;    Current Outpatient Medications  Medication Sig Dispense Refill  . amLODipine (NORVASC) 5 MG tablet Take 5 mg by mouth daily.    . citalopram (CELEXA) 40 MG tablet Take 40 mg by mouth daily.    . fish oil-omega-3 fatty acids 1000 MG capsule Take 2 g by mouth daily.    . mirtazapine (REMERON) 15 MG tablet Take 15 mg by mouth at bedtime.    . simvastatin (ZOCOR) 20 MG tablet Take 20 mg by mouth every evening.    . Vitamin D, Ergocalciferol, (DRISDOL) 50000 UNITS CAPS Take 50,000 Units by mouth.    . vitamin E 400 UNIT capsule Take 400 Units by mouth daily.     No current facility-administered medications for this visit.     No Known Allergies  Social History   Socioeconomic History  . Marital status: Legally Separated    Spouse name: Not on file  . Number of children: Not on file  . Years of education: Not on file  . Highest education level: Not on file  Occupational History  .  Occupation: Retired-sales rep  Social Needs  . Financial resource strain: Not on file  . Food insecurity    Worry: Not on file    Inability: Not on file  . Transportation needs    Medical: Not on file    Non-medical: Not on file  Tobacco Use  . Smoking status: Current Every Day Smoker    Packs/day: 0.50    Years: 55.00    Pack years: 27.50    Types: Cigarettes  . Smokeless tobacco: Never Used  Substance and Sexual Activity  . Alcohol use: No  . Drug use: Not Currently  . Sexual activity: Not on file  Lifestyle  . Physical activity    Days per week: Not on file     Minutes per session: Not on file  . Stress: Not on file  Relationships  . Social Herbalist on phone: Not on file    Gets together: Not on file    Attends religious service: Not on file    Active member of club or organization: Not on file    Attends meetings of clubs or organizations: Not on file    Relationship status: Not on file  . Intimate partner violence    Fear of current or ex partner: Not on file    Emotionally abused: Not on file    Physically abused: Not on file    Forced sexual activity: Not on file  Other Topics Concern  . Not on file  Social History Narrative  . Not on file    Family History  Problem Relation Age of Onset  . Lung cancer Brother   . Heart disease Brother   . Heart failure Mother   . Parkinson's disease Father     Review of Systems:  As stated in the HPI and otherwise negative.   BP 138/60   Pulse 78   Ht 5\' 2"  (1.575 m)   Wt 117 lb (53.1 kg)   SpO2 97%   BMI 21.40 kg/m   Physical Examination: General: Well developed, well nourished, NAD  HEENT: OP clear, mucus membranes moist  SKIN: warm, dry. No rashes. Neuro: No focal deficits  Musculoskeletal: Muscle strength 5/5 all ext  Psychiatric: Mood and affect normal  Neck: No JVD, no carotid bruits, no thyromegaly, no lymphadenopathy.  Lungs:Rhonci diffusely. No wheezes or crackles Cardiovascular: Regular rate and rhythm. Loud, harsh, late peaking systolic murmur.  Abdomen:Soft. Bowel sounds present. Non-tender.  Extremities: No lower extremity edema. Pulses are 2 + in the bilateral DP/PT.  EKG:  EKG is ordered today. The ekg ordered today demonstrates V paced rhythm  Echo July 2020:   1. The left ventricle has hyperdynamic systolic function, with an ejection fraction of >65%. The cavity size was normal. Left ventricular diastolic Doppler parameters are consistent with impaired relaxation.  2. The right ventricle has normal systolic function. The cavity was normal. There is  no increase in right ventricular wall thickness.  3. There is mild mitral annular calcification present.  4. The aortic valve is tricuspid. Severely thickening of the aortic valve. Severe calcifcation of the aortic valve. Aortic valve regurgitation was not assessed by color flow Doppler. Severe stenosis of the aortic valve.  5. Aortic valve peak velocity: 4.79m/s, mean gradient 7mmHg.  6. The aorta is normal in size and structure.  FINDINGS  Left Ventricle: The left ventricle has hyperdynamic systolic function, with an ejection fraction of >65%. The cavity size was normal. There is no increase  in left ventricular wall thickness. Left ventricular diastolic Doppler parameters are consistent  with impaired relaxation.  Right Ventricle: The right ventricle has normal systolic function. The cavity was normal. There is no increase in right ventricular wall thickness.  Left Atrium: Left atrial size was normal in size.  Right Atrium: Right atrial size was normal in size. Right atrial pressure is estimated at 3 mmHg.  Interatrial Septum: No atrial level shunt detected by color flow Doppler.  Pericardium: There is no evidence of pericardial effusion.  Mitral Valve: The mitral valve is normal in structure. There is mild mitral annular calcification present. Mitral valve regurgitation is mild by color flow Doppler.  Tricuspid Valve: The tricuspid valve is normal in structure. Tricuspid valve regurgitation is mild by color flow Doppler.  Aortic Valve: The aortic valve is tricuspid Severely thickening of the aortic valve. Severe calcifcation of the aortic valve. Aortic valve regurgitation was not assessed by color flow Doppler. There is Severe stenosis of the aortic valve, with a  calculated valve area of 0.48 cm. Aortic valve peak velocity: 4.58m/s, mean gradient 39mmHg.  Pulmonic Valve: The pulmonic valve was normal in structure. Pulmonic valve regurgitation is not visualized by color flow  Doppler.  Aorta: The aorta is normal in size and structure.  Venous: The inferior vena cava is normal in size with greater than 50% respiratory variability.    +--------------+--------++ LEFT VENTRICLE         +--------------+--------++ PLAX 2D                +--------------+--------++ LVIDd:        3.59 cm  +--------------+--------++ LVIDs:        2.53 cm  +--------------+--------++ LV PW:        0.93 cm  +--------------+--------++ LV IVS:       0.71 cm  +--------------+--------++ LVOT diam:    1.70 cm  +--------------+--------++ LV SV:        31 ml    +--------------+--------++ LV SV Index:  19.96    +--------------+--------++ LVOT Area:    2.27 cm +--------------+--------++                        +--------------+--------++  +---------------+----------++ RIGHT VENTRICLE           +---------------+----------++ RV Basal diam: 2.09 cm    +---------------+----------++ RV S prime:    13.10 cm/s +---------------+----------++ TAPSE (M-mode):2.3 cm     +---------------+----------++  +---------------+-------++-----------++ LEFT ATRIUM           Index       +---------------+-------++-----------++ LA diam:       2.80 cm1.76 cm/m  +---------------+-------++-----------++ LA Vol (A2C):  73.6 ml46.39 ml/m +---------------+-------++-----------++ LA Vol (A4C):  23.8 ml15.00 ml/m +---------------+-------++-----------++ LA Biplane Vol:45.6 ml28.74 ml/m +---------------+-------++-----------++ +------------+--------++-----------++ RIGHT ATRIUM        Index       +------------+--------++-----------++ RA Area:    9.78 cm            +------------+--------++-----------++ RA Volume:  18.90 ml11.91 ml/m +------------+--------++-----------++  +------------------+------------++ AORTIC VALVE                   +------------------+------------++ AV Area (Vmax):    0.56 cm     +------------------+------------++ AV Area (Vmean):  0.50 cm     +------------------+------------++ AV Area (VTI):    0.48 cm     +------------------+------------++ AV Vmax:          410.00 cm/s  +------------------+------------++ AV  Vmean:         276.750 cm/s +------------------+------------++ AV VTI:           1.045 m      +------------------+------------++ AV Peak Grad:     67.2 mmHg    +------------------+------------++ AV Mean Grad:     36.2 mmHg    +------------------+------------++ LVOT Vmax:        101.00 cm/s  +------------------+------------++ LVOT Vmean:       60.800 cm/s  +------------------+------------++ LVOT VTI:         0.219 m      +------------------+------------++ LVOT/AV VTI ratio:0.21         +------------------+------------++ AR PHT:           744 msec     +------------------+------------++   +-------------+-------++ AORTA                +-------------+-------++ Ao Root diam:2.60 cm +-------------+-------++  +--------------+----------++  +---------------+-----------++ MITRAL VALVE              TRICUSPID VALVE            +--------------+----------++  +---------------+-----------++ MV Area (PHT):2.95 cm    TR Peak grad:  27.4 mmHg   +--------------+----------++  +---------------+-----------++ MV PHT:       74.53 msec  TR Vmax:       267.00 cm/s +--------------+----------++  +---------------+-----------++ MV Decel Time:257 msec   +--------------+----------++  +--------------+-------+ +--------------+-----------++ SHUNTS                MV E velocity:96.10 cm/s  +--------------+-------+ +--------------+-----------++ Systemic VTI: 0.22 m  MV A velocity:137.00 cm/s +--------------+-------+ +--------------+-----------++ Systemic Diam:1.70 cm MV E/A ratio: 0.70        +--------------+-------+ +--------------+-----------++    Recent Labs: 10/20/2018: ALT 22; BUN 16; Creatinine, Ser 0.82; Hemoglobin 15.6; Magnesium 1.9; Platelets 130; Potassium 4.3; Sodium 139; TSH 2.350   Wt Readings from Last 3 Encounters:  11/01/18 117 lb (53.1 kg)  10/21/18 111 lb 11.2 oz (50.7 kg)  11/13/11 116 lb (52.6 kg)     Other studies Reviewed: Additional studies/ records that were reviewed today include: echo images, hospital notes.  Review of the above records demonstrates: severe AS   Assessment and Plan:   1. Severe Aortic Valve Stenosis: She has severe, stage D aortic valve stenosis. I have personally reviewed the echo images. The aortic valve is thickened, calcified with limited leaflet mobility. I think she would benefit from AVR. Given advanced age, she is not a good candidate for conventional AVR by surgical approach. I think she may be a good candidate for TAVR.   STS Risk Score: Risk of Mortality: 3.323% Renal Failure: 1.807% Permanent Stroke: 1.142% Prolonged Ventilation: 8.809% DSW Infection: 0.089% Reoperation: 3.191% Morbidity or Mortality: 13.176% Short Length of Stay: 28.155% Long Length of Stay: 6.069%  I have reviewed the natural history of aortic stenosis with the patient and their family members  who are present today. We have discussed the limitations of medical therapy and the poor prognosis associated with symptomatic aortic stenosis. We have reviewed potential treatment options, including palliative medical therapy, conventional surgical aortic valve replacement, and transcatheter aortic valve replacement. We discussed treatment options in the context of the patient's specific comorbid medical conditions.   She would like to proceed with planning for TAVR. I will arrange a right and left heart catheterization at [facility] 11/24/18 at 8:30 am. Risks and benefits of the cath procedure and the valve procedure are reviewed with the patient. After the cath, she  will have a cardiac CT, CTA of the chest/abdomen and pelvis,  carotid artery dopplers, PT assessment and will then be referred to see one of the CT surgeons on our TAVR team.   Current medicines are reviewed at length with the patient today.  The patient does not have concerns regarding medicines.  The following changes have been made:  no change  Labs/ tests ordered today include:   Orders Placed This Encounter  Procedures  . CBC  . Basic Metabolic Panel (BMET)     Disposition:   FU with the valve team   Signed, Lauree Chandler, MD 11/01/2018 9:59 AM    [address], [place], [place]  [address] Phone: [phone]; Fax: [phone]

## 2018-10-29 ENCOUNTER — Telehealth: Payer: Self-pay

## 2018-10-29 NOTE — Telephone Encounter (Signed)
Called pt to do COVID screening..No answer.Marland Kitchen

## 2018-11-01 ENCOUNTER — Other Ambulatory Visit: Payer: Self-pay

## 2018-11-01 ENCOUNTER — Ambulatory Visit (INDEPENDENT_AMBULATORY_CARE_PROVIDER_SITE_OTHER): Payer: Medicare HMO | Admitting: Cardiovascular Disease

## 2018-11-01 ENCOUNTER — Encounter: Payer: Self-pay | Admitting: Cardiovascular Disease

## 2018-11-01 VITALS — BP 138/60 | HR 78 | Ht 62.0 in | Wt 117.0 lb

## 2018-11-01 DIAGNOSIS — I350 Nonrheumatic aortic (valve) stenosis: Secondary | ICD-10-CM

## 2018-11-01 NOTE — Patient Instructions (Addendum)
Medication Instructions:  Your physician recommends that you continue on your current medications as directed. Please refer to the Current Medication list given to you today. See instructions below for procedure If you need a refill on your cardiac medications before your next appointment, please call your pharmacy.   Lab work: Your physician recommends that you return for lab work on August 21,2020  If you have labs (blood work) drawn today and your tests are completely normal, you will receive your results only by: Marland Kitchen MyChart Message (if you have MyChart) OR . A paper copy in the mail If you have any lab test that is abnormal or we need to change your treatment, we will call you to review the results.  Testing/Procedures: Your physician has requested that you have a cardiac catheterization. Cardiac catheterization is used to diagnose and/or treat various heart conditions. Doctors may recommend this procedure for a number of different reasons. The most common reason is to evaluate chest pain. Chest pain can be a symptom of coronary artery disease (CAD), and cardiac catheterization can show whether plaque is narrowing or blocking your heart's arteries. This procedure is also used to evaluate the valves, as well as measure the blood flow and oxygen levels in different parts of your heart. For further information please visit [email or web address]. Please follow instruction sheet, as given.  Scheduled for August 26,2020  Follow-Up: To be arranged after catheterization     [place] [facility] [facility] [place], [place] [address] Dept: [phone] Loc: [place]  11/01/2018  You are scheduled for a Cardiac Catheterization on Wednesday, August 26 with Dr. Lauree Chandler.  1. Please arrive at the [facility] (Main Entrance A) at [facility]: [address] [place],  [place] [address] at 6:30 AM (This time is two hours before your procedure to ensure your preparation). You may have one person accompany you.  This person will need to wait in the waiting room.  You and the person who comes with you will need to wear a mask into the hospital.   Special note: Every effort is made to have your procedure done on time. Please understand that emergencies sometimes delay scheduled procedures.  2. Diet: Do not eat solid foods after midnight.  The patient may have clear liquids until 5am upon the day of the procedure.  3. Labs: You will need to have blood drawn on Friday, August 21 at [facility] at [facility]. [address], [place]      Phone: [phone]. You do not need to be fasting.  Lab work is scheduled for August 21,2020 at 1:30. After having lab work done you will need to have [place] testing at the drive thru location at [place]. This is scheduled for 2:00 on August 21.   Go in the yellow lane on the right and go to the over hang.  You will need to quarantine after COVID testing until time of catheterization.     4. Medication instructions in preparation for your procedure:   Contrast Allergy: No   On the morning of your procedure, take aspirin 81 mg and any morning medicines NOT listed above.  You may use sips of water.  5. Plan for one night stay--bring personal belongings. 6. Bring a current list of your medications and current insurance cards. 7. You MUST have a responsible person to drive  you home. 8. Someone MUST be with you the first 24 hours after you arrive home or your discharge will be delayed. 9. Please wear clothes that are easy to get on and off and wear slip-on shoes.  Thank you for allowing [place] to care for you!   -- [facility]

## 2018-11-03 ENCOUNTER — Telehealth: Payer: Self-pay

## 2018-11-03 NOTE — Telephone Encounter (Signed)
    COVID-19 Pre-Screening Questions:  . In the past 7 to 10 days have you had a cough,  shortness of breath, headache, congestion, fever (100 or greater) body aches, chills, sore throat, or sudden loss of taste or sense of smell? No . Have you been around anyone with known Covid 19. No . Have you been around anyone who is awaiting Covid 19 test results in the past 7 to 10 days? No . Have you been around anyone who has been exposed to Covid 19, or has mentioned symptoms of Covid 19 within the past 7 to 10 days? No  If you have any concerns/questions about symptoms patients report during screening (either on the phone or at threshold). Contact the provider seeing the patient or DOD for further guidance.  If neither are available contact a member of the leadership team.         Pt answered no to all covid-19 prescreening questions. I asked the pt to wear a mask if she has one. I let the pt know we are reducing the number of people coming into the office and if she can physically come alone to please do so. The pt states she will need help from her niece at the appointment. I told her that her niece can come and she will have to wear a mask as well. I told her if anything changes between now and her appointment time to please call the office to let us know. The pt verbalized understanding.

## 2018-11-04 ENCOUNTER — Ambulatory Visit (INDEPENDENT_AMBULATORY_CARE_PROVIDER_SITE_OTHER): Payer: Medicare HMO | Admitting: *Deleted

## 2018-11-04 ENCOUNTER — Other Ambulatory Visit: Payer: Self-pay

## 2018-11-04 DIAGNOSIS — I442 Atrioventricular block, complete: Secondary | ICD-10-CM | POA: Diagnosis not present

## 2018-11-04 LAB — CUP PACEART INCLINIC DEVICE CHECK
Battery Remaining Longevity: 165 mo
Battery Voltage: 3.19 V
Brady Statistic AP VP Percent: 0.16 %
Brady Statistic AP VS Percent: 0.03 %
Brady Statistic AS VP Percent: 53.92 %
Brady Statistic AS VS Percent: 45.9 %
Brady Statistic RA Percent Paced: 0.19 %
Brady Statistic RV Percent Paced: 54.07 %
Date Time Interrogation Session: 20200806162808
Implantable Lead Implant Date: 20200722
Implantable Lead Implant Date: 20200722
Implantable Lead Location: 753859
Implantable Lead Location: 753860
Implantable Lead Model: 1944
Implantable Lead Model: 1948
Implantable Pulse Generator Implant Date: 20200722
Lead Channel Impedance Value: 380 Ohm
Lead Channel Impedance Value: 456 Ohm
Lead Channel Impedance Value: 646 Ohm
Lead Channel Impedance Value: 855 Ohm
Lead Channel Pacing Threshold Amplitude: 0.375 V
Lead Channel Pacing Threshold Amplitude: 0.625 V
Lead Channel Pacing Threshold Pulse Width: 0.4 ms
Lead Channel Pacing Threshold Pulse Width: 0.4 ms
Lead Channel Sensing Intrinsic Amplitude: 10.625 mV
Lead Channel Sensing Intrinsic Amplitude: 11.625 mV
Lead Channel Sensing Intrinsic Amplitude: 2.75 mV
Lead Channel Sensing Intrinsic Amplitude: 4 mV
Lead Channel Setting Pacing Amplitude: 3.5 V
Lead Channel Setting Pacing Amplitude: 3.5 V
Lead Channel Setting Pacing Pulse Width: 0.4 ms
Lead Channel Setting Sensing Sensitivity: 1.2 mV

## 2018-11-04 NOTE — Progress Notes (Signed)
Wound check appointment. Dermabond removed. Wound without redness or edema. Incision edges approximated, wound well healed. Normal device function. Thresholds, sensing, and impedances consistent with implant measurements. Device programmed at 3.5V for extra safety margin until 3 month visit. Histogram distribution appropriate for patient and level of activity. No mode switches or high ventricular rates noted. Patient educated about wound care, arm mobility, lifting restrictions. ROV w/ SK 01/21/19. 

## 2018-11-19 ENCOUNTER — Other Ambulatory Visit: Payer: Self-pay

## 2018-11-19 ENCOUNTER — Inpatient Hospital Stay (HOSPITAL_COMMUNITY): Admission: RE | Admit: 2018-11-19 | Payer: Medicare HMO | Source: Ambulatory Visit

## 2018-11-19 ENCOUNTER — Other Ambulatory Visit: Payer: Medicare HMO | Admitting: *Deleted

## 2018-11-19 DIAGNOSIS — I350 Nonrheumatic aortic (valve) stenosis: Secondary | ICD-10-CM | POA: Diagnosis not present

## 2018-11-20 ENCOUNTER — Other Ambulatory Visit (HOSPITAL_COMMUNITY)
Admission: RE | Admit: 2018-11-20 | Discharge: 2018-11-20 | Disposition: A | Discharge status: Home / Self Care | Payer: Medicare HMO | Source: Ambulatory Visit | Attending: Cardiovascular Disease | Admitting: Cardiovascular Disease

## 2018-11-20 DIAGNOSIS — Z20828 Contact with and (suspected) exposure to other viral communicable diseases: Secondary | ICD-10-CM | POA: Insufficient documentation

## 2018-11-20 DIAGNOSIS — Z01812 Encounter for preprocedural laboratory examination: Secondary | ICD-10-CM | POA: Diagnosis not present

## 2018-11-20 LAB — BASIC METABOLIC PANEL
BUN/Creatinine Ratio: 16 (ref 12–28)
BUN: 11 mg/dL (ref 8–27)
CO2: 24 mmol/L (ref 20–29)
Calcium: 9.3 mg/dL (ref 8.7–10.3)
Chloride: 101 mmol/L (ref 96–106)
Creatinine, Ser: 0.69 mg/dL (ref 0.57–1.00)
GFR calc Af Amer: 95 mL/min/{1.73_m2} (ref >59)
GFR calc non Af Amer: 82 mL/min/{1.73_m2} (ref >59)
Glucose: 93 mg/dL (ref 65–99)
Potassium: 4.2 mmol/L (ref 3.5–5.2)
Sodium: 140 mmol/L (ref 134–144)

## 2018-11-20 LAB — CBC
Hematocrit: 45 % (ref 34.0–46.6)
Hemoglobin: 15.4 g/dL (ref 11.1–15.9)
MCH: 30.8 pg (ref 26.6–33.0)
MCHC: 34.2 g/dL (ref 31.5–35.7)
MCV: 90 fL (ref 79–97)
Platelets: 151 10*3/uL (ref 150–450)
RBC: 5 x10E6/uL (ref 3.77–5.28)
RDW: 13.5 % (ref 11.7–15.4)
WBC: 6 10*3/uL (ref 3.4–10.8)

## 2018-11-20 LAB — SARS CORONAVIRUS 2 (TAT 6-24 HRS): SARS Coronavirus 2: NEGATIVE

## 2018-11-23 ENCOUNTER — Other Ambulatory Visit: Payer: Self-pay

## 2018-11-23 ENCOUNTER — Telehealth: Payer: Self-pay | Admitting: *Deleted

## 2018-11-23 DIAGNOSIS — I350 Nonrheumatic aortic (valve) stenosis: Secondary | ICD-10-CM

## 2018-11-23 NOTE — Telephone Encounter (Signed)
Pt contacted pre-catheterization scheduled at [facility] for: Wednesday November 24, 2018 8:30 AM Verified arrival time and place: [facility]) at: 6:30 AM   No solid food after midnight prior to cath, clear liquids until 5 AM day of procedure. Contrast allergy: no   AM meds can be  taken pre-cath with sip of water including: ASA 81 mg   Confirmed patient has responsible person to drive home post procedure and observe 24 hours after arriving home: yes  Currently, due to Covid-19 pandemic, only one support person will be allowed with patient. Must be the same support person for that patient's entire stay, will be screened and required to wear a mask. They will be asked to wait in the waiting room for the duration of the patient's stay.  Patients are required to wear a mask when they enter the hospital.      COVID-19 Pre-Screening Questions:  . In the past 7 to 10 days have you had a cough,  shortness of breath, headache, congestion, fever (100 or greater) body aches, chills, sore throat, or sudden loss of taste or sense of smell? no . Have you been around anyone with known Covid 19? no . Have you been around anyone who is awaiting Covid 19 test results in the past 7 to 10 days? no . Have you been around anyone who has been exposed to Covid 19, or has mentioned symptoms of Covid 19 within the past 7 to 10 days? no   I reviewed procedure/mask/visitor,Covid-19 screening questions with patient, she verbalized understanding, thanked me for call.

## 2018-11-24 ENCOUNTER — Encounter (HOSPITAL_COMMUNITY)
Admission: RE | Disposition: A | Discharge status: Home / Self Care | Payer: Medicare HMO | Source: Home / Self Care | Attending: Cardiovascular Disease

## 2018-11-24 ENCOUNTER — Encounter (HOSPITAL_COMMUNITY): Payer: Self-pay | Admitting: Cardiovascular Disease

## 2018-11-24 ENCOUNTER — Ambulatory Visit (HOSPITAL_COMMUNITY)
Admission: RE | Admit: 2018-11-24 | Discharge: 2018-11-24 | Disposition: A | Discharge status: Home / Self Care | Payer: Medicare HMO | Attending: Cardiovascular Disease | Admitting: Cardiovascular Disease

## 2018-11-24 ENCOUNTER — Other Ambulatory Visit: Payer: Self-pay

## 2018-11-24 DIAGNOSIS — I2510 Atherosclerotic heart disease of native coronary artery without angina pectoris: Secondary | ICD-10-CM | POA: Insufficient documentation

## 2018-11-24 DIAGNOSIS — F329 Major depressive disorder, single episode, unspecified: Secondary | ICD-10-CM | POA: Diagnosis not present

## 2018-11-24 DIAGNOSIS — Z950 Presence of cardiac pacemaker: Secondary | ICD-10-CM | POA: Insufficient documentation

## 2018-11-24 DIAGNOSIS — E7800 Pure hypercholesterolemia, unspecified: Secondary | ICD-10-CM | POA: Insufficient documentation

## 2018-11-24 DIAGNOSIS — I350 Nonrheumatic aortic (valve) stenosis: Secondary | ICD-10-CM

## 2018-11-24 DIAGNOSIS — I10 Essential (primary) hypertension: Secondary | ICD-10-CM | POA: Diagnosis not present

## 2018-11-24 DIAGNOSIS — E785 Hyperlipidemia, unspecified: Secondary | ICD-10-CM | POA: Diagnosis not present

## 2018-11-24 DIAGNOSIS — F419 Anxiety disorder, unspecified: Secondary | ICD-10-CM | POA: Diagnosis not present

## 2018-11-24 DIAGNOSIS — I442 Atrioventricular block, complete: Secondary | ICD-10-CM | POA: Diagnosis not present

## 2018-11-24 DIAGNOSIS — R69 Illness, unspecified: Secondary | ICD-10-CM | POA: Diagnosis not present

## 2018-11-24 DIAGNOSIS — F17210 Nicotine dependence, cigarettes, uncomplicated: Secondary | ICD-10-CM | POA: Diagnosis not present

## 2018-11-24 DIAGNOSIS — Z79899 Other long term (current) drug therapy: Secondary | ICD-10-CM | POA: Diagnosis not present

## 2018-11-24 HISTORY — PX: RIGHT/LEFT HEART CATH AND CORONARY ANGIOGRAPHY: CATH118266

## 2018-11-24 LAB — POCT I-STAT EG7
Acid-base deficit: 1 mmol/L (ref 0.0–2.0)
Bicarbonate: 25.2 mmol/L (ref 20.0–28.0)
Calcium, Ion: 1.14 mmol/L — ABNORMAL LOW (ref 1.15–1.40)
HCT: 41 % (ref 36.0–46.0)
Hemoglobin: 13.9 g/dL (ref 12.0–15.0)
O2 Saturation: 69 %
Potassium: 3.7 mmol/L (ref 3.5–5.1)
Sodium: 143 mmol/L (ref 135–145)
TCO2: 27 mmol/L (ref 22–32)
pCO2, Ven: 46.7 mmHg (ref 44.0–60.0)
pH, Ven: 7.34 (ref 7.250–7.430)
pO2, Ven: 38 mmHg (ref 32.0–45.0)

## 2018-11-24 LAB — POCT I-STAT 7, (LYTES, BLD GAS, ICA,H+H)
Acid-base deficit: 2 mmol/L (ref 0.0–2.0)
Bicarbonate: 23.7 mmol/L (ref 20.0–28.0)
Calcium, Ion: 1.18 mmol/L (ref 1.15–1.40)
HCT: 41 % (ref 36.0–46.0)
Hemoglobin: 13.9 g/dL (ref 12.0–15.0)
O2 Saturation: 99 %
Potassium: 3.8 mmol/L (ref 3.5–5.1)
Sodium: 142 mmol/L (ref 135–145)
TCO2: 25 mmol/L (ref 22–32)
pCO2 arterial: 42.2 mmHg (ref 32.0–48.0)
pH, Arterial: 7.358 (ref 7.350–7.450)
pO2, Arterial: 121 mmHg — ABNORMAL HIGH (ref 83.0–108.0)

## 2018-11-24 SURGERY — RIGHT/LEFT HEART CATH AND CORONARY ANGIOGRAPHY
Anesthesia: LOCAL

## 2018-11-24 MED ORDER — VERAPAMIL HCL 2.5 MG/ML IV SOLN
INTRAVENOUS | Status: DC | PRN
  Administered 2018-11-24: 10 mL via INTRA_ARTERIAL

## 2018-11-24 MED ORDER — LIDOCAINE HCL (PF) 1 % IJ SOLN
INTRAMUSCULAR | Status: AC
  Filled 2018-11-24: qty 30

## 2018-11-24 MED ORDER — FENTANYL CITRATE (PF) 100 MCG/2ML IJ SOLN
INTRAMUSCULAR | Status: DC | PRN
  Administered 2018-11-24: 25 ug via INTRAVENOUS

## 2018-11-24 MED ORDER — SODIUM CHLORIDE 0.9 % IV SOLN: 250.0000 mL | INTRAVENOUS | Status: DC | PRN

## 2018-11-24 MED ORDER — HEPARIN SODIUM (PORCINE) 1000 UNIT/ML IJ SOLN
INTRAMUSCULAR | Status: AC
  Filled 2018-11-24: qty 1

## 2018-11-24 MED ORDER — MIDAZOLAM HCL 2 MG/2ML IJ SOLN
INTRAMUSCULAR | Status: DC | PRN
  Administered 2018-11-24: 1 mg via INTRAVENOUS

## 2018-11-24 MED ORDER — LABETALOL HCL 5 MG/ML IV SOLN: 10.0000 mg | INTRAVENOUS | Status: DC | PRN

## 2018-11-24 MED ORDER — HYDRALAZINE HCL 20 MG/ML IJ SOLN: 10.0000 mg | INTRAMUSCULAR | Status: DC | PRN

## 2018-11-24 MED ORDER — SODIUM CHLORIDE 0.9% FLUSH: 3.0000 mL | INTRAVENOUS | Status: DC | PRN

## 2018-11-24 MED ORDER — HEPARIN (PORCINE) IN NACL 1000-0.9 UT/500ML-% IV SOLN
INTRAVENOUS | Status: DC | PRN
  Administered 2018-11-24 (×2): 500 mL

## 2018-11-24 MED ORDER — ASPIRIN 81 MG PO CHEW: 81.0000 mg | CHEWABLE_TABLET | ORAL | Status: DC

## 2018-11-24 MED ORDER — SODIUM CHLORIDE 0.9% FLUSH: 3.0000 mL | Freq: Two times a day (BID) | INTRAVENOUS | Status: DC

## 2018-11-24 MED ORDER — HEPARIN SODIUM (PORCINE) 1000 UNIT/ML IJ SOLN
INTRAMUSCULAR | Status: DC | PRN
  Administered 2018-11-24: 3000 [IU] via INTRAVENOUS

## 2018-11-24 MED ORDER — SODIUM CHLORIDE 0.9 % WEIGHT BASED INFUSION
3.0000 mL/kg/h | INTRAVENOUS | Status: AC
  Administered 2018-11-24: 3 mL/kg/h via INTRAVENOUS

## 2018-11-24 MED ORDER — IOHEXOL 350 MG/ML SOLN
INTRAVENOUS | Status: DC | PRN
  Administered 2018-11-24: 09:00:00 40 mL via INTRAVENOUS

## 2018-11-24 MED ORDER — MIDAZOLAM HCL 2 MG/2ML IJ SOLN
INTRAMUSCULAR | Status: AC
  Filled 2018-11-24: qty 2

## 2018-11-24 MED ORDER — ONDANSETRON HCL 4 MG/2ML IJ SOLN: 4.0000 mg | Freq: Four times a day (QID) | INTRAMUSCULAR | Status: DC | PRN

## 2018-11-24 MED ORDER — LIDOCAINE HCL (PF) 1 % IJ SOLN
INTRAMUSCULAR | Status: DC | PRN
  Administered 2018-11-24 (×2): 2 mL

## 2018-11-24 MED ORDER — FENTANYL CITRATE (PF) 100 MCG/2ML IJ SOLN
INTRAMUSCULAR | Status: AC
  Filled 2018-11-24: qty 2

## 2018-11-24 MED ORDER — VERAPAMIL HCL 2.5 MG/ML IV SOLN
INTRAVENOUS | Status: AC
  Filled 2018-11-24: qty 2

## 2018-11-24 MED ORDER — ACETAMINOPHEN 325 MG PO TABS: 650.0000 mg | ORAL_TABLET | ORAL | Status: DC | PRN

## 2018-11-24 MED ORDER — SODIUM CHLORIDE 0.9 % WEIGHT BASED INFUSION: 1.0000 mL/kg/h | INTRAVENOUS | Status: DC

## 2018-11-24 MED ORDER — SODIUM CHLORIDE 0.9 % IV SOLN: INTRAVENOUS | Status: AC

## 2018-11-24 MED ORDER — HEPARIN (PORCINE) IN NACL 1000-0.9 UT/500ML-% IV SOLN
INTRAVENOUS | Status: AC
  Filled 2018-11-24: qty 1000

## 2018-11-24 SURGICAL SUPPLY — 14 items
CATH 5FR JL3.5 JR4 ANG PIG MP (CATHETERS) ×1 IMPLANT
CATH BALLN WEDGE 5F 110CM (CATHETERS) ×1 IMPLANT
CATH INFINITI 5FR AL1 (CATHETERS) ×1 IMPLANT
DEVICE RAD TR BAND REGULAR (VASCULAR PRODUCTS) ×1 IMPLANT
GLIDESHEATH SLEND SS 6F .021 (SHEATH) ×1 IMPLANT
GUIDEWIRE .025 260CM (WIRE) ×1 IMPLANT
GUIDEWIRE INQWIRE 1.5J.035X260 (WIRE) IMPLANT
INQWIRE 1.5J .035X260CM (WIRE) ×2
KIT HEART LEFT (KITS) ×2 IMPLANT
PACK CARDIAC CATHETERIZATION (CUSTOM PROCEDURE TRAY) ×2 IMPLANT
SHEATH GLIDE SLENDER 4/5FR (SHEATH) ×1 IMPLANT
TRANSDUCER W/STOPCOCK (MISCELLANEOUS) ×2 IMPLANT
TUBING CIL FLEX 10 FLL-RA (TUBING) ×2 IMPLANT
WIRE HI TORQ VERSACORE-J 145CM (WIRE) ×1 IMPLANT

## 2018-11-24 NOTE — Interval H&P Note (Signed)
History and Physical Interval Note:  11/24/2018 7:21 AM  [name]  has presented today for surgery, with the diagnosis of aortic stenosis.  The various methods of treatment have been discussed with the patient and family. After consideration of risks, benefits and other options for treatment, the patient has consented to  Procedure(s): RIGHT/LEFT HEART CATH AND CORONARY ANGIOGRAPHY (N/A) as a surgical intervention.  The patient's history has been reviewed, patient examined, no change in status, stable for surgery.  I have reviewed the patient's chart and labs.  Questions were answered to the patient's satisfaction.    Cath Lab Visit (complete for each Cath Lab visit)  Clinical Evaluation Leading to the Procedure:   ACS: No.  Non-ACS:    Anginal Classification: CCS II  Anti-ischemic medical therapy: No Therapy  Non-Invasive Test Results: No non-invasive testing performed  Prior CABG: No previous CABG       Lauree Chandler

## 2018-11-24 NOTE — Discharge Instructions (Signed)
DRINK PLENTY OF FLUIDS FOR THE NEXT 2-3 DAYS.  KEEP AFFECTED ARM ELEVATED FOR THE REMAINDER OF THE DAY.  Radial Site Care  This sheet gives you information about how to care for yourself after your procedure. Your health care provider may also give you more specific instructions. If you have problems or questions, contact your health care provider. What can I expect after the procedure? After the procedure, it is common to have:  Bruising and tenderness at the catheter insertion area. Follow these instructions at home: Medicines  Take over-the-counter and prescription medicines only as told by your health care provider. Insertion site care  Follow instructions from your health care provider about how to take care of your insertion site. Make sure you: ? Wash your hands with soap and water before you change your bandage (dressing). If soap and water are not available, use hand sanitizer. ? Change your dressing as told by your health care provider.  Check your insertion site every day for signs of infection. Check for: ? Redness, swelling, or pain. ? Fluid or blood. ? Pus or a bad smell. ? Warmth.  Do not take baths, swim, or use a hot tub until your health care provider approves.  You may shower 24-48 hours after the procedure. ? Remove the dressing and gently wash the site with plain soap and water. ? Pat the area dry with a clean towel. ? Do not rub the site. That could cause bleeding.  Do not apply powder or lotion to the site. Activity   For 24 hours after the procedure, or as directed by your health care provider: ? Do not flex or bend the affected arm. ? Do not push or pull heavy objects with the affected arm. ? Do not drive yourself home from the hospital or clinic. You may drive 24 hours after the procedure . ? Do not operate machinery or power tools.  Do not lift anything that is heavier than 10 lb for 5 days.  Ask your health care provider when it is okay  to: ? Return to work or school. ? Resume usual physical activities or sports. ? Resume sexual activity. General instructions  If the catheter site starts to bleed, raise your arm and put firm pressure on the site. If the bleeding does not stop, get help right away. This is a medical emergency.  If you went home on the same day as your procedure, a responsible adult should be with you for the first 24 hours after you arrive home.  Keep all follow-up visits as told by your health care provider. This is important. Contact a health care provider if:  You have a fever.  You have redness, swelling, or yellow drainage around your insertion site. Get help right away if:  You have unusual pain at the radial site.  The catheter insertion area swells very fast.  The insertion area is bleeding, and the bleeding does not stop when you hold steady pressure on the area.  Your arm or hand becomes pale, cool, tingly, or numb. These symptoms may represent a serious problem that is an emergency. Do not wait to see if the symptoms will go away. Get medical help right away. Call your local emergency services (911 in the [place] Do not drive yourself to the hospital. Summary  After the procedure, it is common to have bruising and tenderness at the site.  Follow instructions from your health care provider about how to take care of your radial  site wound. Check the wound every day for signs of infection.  Do not lift anything that is heavier than 10 lb for 5 days. This information is not intended to replace advice given to you by your health care provider. Make sure you discuss any questions you have with your health care provider. Document Released: 04/19/2010 Document Revised: 04/22/2017 Document Reviewed: 04/22/2017 Elsevier Patient Education  2020 [employer].

## 2018-11-25 ENCOUNTER — Other Ambulatory Visit: Payer: Self-pay | Admitting: Physician Assistant

## 2018-11-25 MED ORDER — HYDROXYZINE PAMOATE 50 MG PO CAPS: 50.0000 mg | ORAL_CAPSULE | Freq: Three times a day (TID) | ORAL | Status: DC | PRN

## 2018-11-26 ENCOUNTER — Other Ambulatory Visit: Payer: Self-pay

## 2018-11-26 ENCOUNTER — Ambulatory Visit (HOSPITAL_COMMUNITY)
Admission: RE | Admit: 2018-11-26 | Discharge: 2018-11-26 | Disposition: A | Discharge status: Home / Self Care | Payer: Medicare HMO | Source: Ambulatory Visit | Attending: Cardiovascular Disease | Admitting: Cardiovascular Disease

## 2018-11-26 ENCOUNTER — Ambulatory Visit (HOSPITAL_BASED_OUTPATIENT_CLINIC_OR_DEPARTMENT_OTHER)
Admission: RE | Admit: 2018-11-26 | Discharge: 2018-11-26 | Disposition: A | Discharge status: Home / Self Care | Payer: Medicare HMO | Source: Ambulatory Visit | Attending: Cardiovascular Disease | Admitting: Cardiovascular Disease

## 2018-11-26 DIAGNOSIS — I350 Nonrheumatic aortic (valve) stenosis: Secondary | ICD-10-CM | POA: Insufficient documentation

## 2018-11-26 DIAGNOSIS — Z01818 Encounter for other preprocedural examination: Secondary | ICD-10-CM | POA: Diagnosis not present

## 2018-11-26 DIAGNOSIS — I700 Atherosclerosis of aorta: Secondary | ICD-10-CM | POA: Diagnosis not present

## 2018-11-26 MED ORDER — IOHEXOL 350 MG/ML SOLN
80.0000 mL | Freq: Once | INTRAVENOUS | Status: AC | PRN
  Administered 2018-11-26: 80 mL via INTRAVENOUS

## 2018-11-26 NOTE — Progress Notes (Signed)
Carotid duplex       has been completed. Preliminary results can be found under CV proc through chart review. Karelly Dewalt, BS, RDMS, RVT   

## 2018-11-29 ENCOUNTER — Encounter: Payer: Self-pay | Admitting: Physician Assistant

## 2018-11-29 ENCOUNTER — Other Ambulatory Visit: Payer: Self-pay | Admitting: Physician Assistant

## 2018-11-29 MED ORDER — ALPRAZOLAM 0.25 MG PO TABS: 0.2500 mg | ORAL_TABLET | Freq: Three times a day (TID) | ORAL | 0 refills | Status: AC | PRN

## 2018-11-29 NOTE — Progress Notes (Signed)
  [facility]   MULTIDISCIPLINARY HEART VALVE TEAM   Patient's caregiver called about increased anxiety going through TAVR process. It is to the point that she is so worried that she is having diarrhea everyday. She wondered if she could have something for anxiety for short term while getting through the process. She has hydroxyzine, but this is not working well. She said she has taken xanax in the past and that has helped. I will call in a short Rx for xanax  (0.25mg  PRN # 10) to help get her through this process. If she requires long term therapy with this medication, she will need to contact her PCP.  Angelena Form PA-C  [facility]

## 2018-11-30 ENCOUNTER — Other Ambulatory Visit: Payer: Self-pay

## 2018-11-30 DIAGNOSIS — I350 Nonrheumatic aortic (valve) stenosis: Secondary | ICD-10-CM

## 2018-12-01 ENCOUNTER — Encounter (HOSPITAL_COMMUNITY): Payer: Self-pay | Admitting: Dentistry

## 2018-12-01 ENCOUNTER — Institutional Professional Consult (permissible substitution): Payer: Medicare HMO | Admitting: Surgery

## 2018-12-01 ENCOUNTER — Ambulatory Visit (HOSPITAL_COMMUNITY): Payer: Self-pay | Admitting: Dentistry

## 2018-12-01 ENCOUNTER — Ambulatory Visit: Payer: Medicare HMO | Admitting: Physical Therapy

## 2018-12-01 ENCOUNTER — Other Ambulatory Visit: Payer: Self-pay

## 2018-12-01 ENCOUNTER — Encounter: Payer: Self-pay | Admitting: Surgery

## 2018-12-01 VITALS — BP 126/66 | HR 73 | Temp 98.3°F

## 2018-12-01 VITALS — BP 149/76 | HR 72 | Temp 97.5°F | Resp 16 | Ht 65.0 in | Wt 117.0 lb

## 2018-12-01 DIAGNOSIS — M2632 Excessive spacing of fully erupted teeth: Secondary | ICD-10-CM

## 2018-12-01 DIAGNOSIS — K036 Deposits [accretions] on teeth: Secondary | ICD-10-CM

## 2018-12-01 DIAGNOSIS — Z01818 Encounter for other preprocedural examination: Secondary | ICD-10-CM

## 2018-12-01 DIAGNOSIS — K06010 Localized gingival recession, unspecified: Secondary | ICD-10-CM

## 2018-12-01 DIAGNOSIS — K029 Dental caries, unspecified: Secondary | ICD-10-CM

## 2018-12-01 DIAGNOSIS — I350 Nonrheumatic aortic (valve) stenosis: Secondary | ICD-10-CM | POA: Diagnosis not present

## 2018-12-01 DIAGNOSIS — M270 Developmental disorders of jaws: Secondary | ICD-10-CM

## 2018-12-01 DIAGNOSIS — K0889 Other specified disorders of teeth and supporting structures: Secondary | ICD-10-CM

## 2018-12-01 DIAGNOSIS — K0530 Chronic periodontitis, unspecified: Secondary | ICD-10-CM

## 2018-12-01 DIAGNOSIS — K08409 Partial loss of teeth, unspecified cause, unspecified class: Secondary | ICD-10-CM

## 2018-12-01 DIAGNOSIS — M264 Malocclusion, unspecified: Secondary | ICD-10-CM

## 2018-12-01 DIAGNOSIS — K030 Excessive attrition of teeth: Secondary | ICD-10-CM

## 2018-12-01 DIAGNOSIS — K045 Chronic apical periodontitis: Secondary | ICD-10-CM

## 2018-12-01 NOTE — Patient Instructions (Signed)
[facility]    Department of Dental Medicine     DR. Shimika Ames      HEART VALVES AND MOUTH CARE:  FACTS:   If you have any infection in your mouth, it can infect your heart valve.  If you heart valve is infected, you will be seriously ill.  Infections in the mouth can be SILENT and do not always cause pain.  Examples of infections in the mouth are gum disease, dental cavities, and abscesses.  Some possible signs of infection are: Bad breath, bleeding gums, or teeth that are sensitive to sweets, hot, and/or cold. There are many other signs as well.  WHAT YOU HAVE TO DO:   Brush your teeth after meals and at bedtime. Spend at least 2 minutes brushing well, especially behind your back teeth and all around your teeth that stand alone. Brush at the gumline also.  Do not go to bed without brushing your teeth and flossing.  If you gums bleed when you brush or floss, do NOT stop brushing or flossing. It usually means that your gums need more attention and better cleaning.   If your Dentist or Dr. Yul Diana gave you a prescription mouthwash to use, make sure to use it as directed. If you run out of the medication, get a refill at the pharmacy.   If you were given any other medications or directions by your Dentist, please follow them. If you did not understand the directions or forget what you were told, please call. We will be happy to refresh her memory.  If you need antibiotics before dental procedures, make sure you take them one hour prior to every dental visit as directed.   Get a dental checkup every 4-6 months in order to keep your mouth healthy, or to find and treat any new infection. You will most likely need your teeth cleaned or gums treated at the same time.  If you are not able to come in for your scheduled appointment, call your Dentist as soon as possible to reschedule.  If you have a problem in between dental visits, call your Dentist.  

## 2018-12-01 NOTE — Progress Notes (Signed)
DENTAL CONSULTATION  Date of Consultation:  12/01/2018 Patient Name:   [name] Date of Birth:   05-Jan-1936 Medical Record Number: [id number]  COVID 19 SCREENING: The patient does not symptoms concerning for COVID-19 infection (Including fever, chills, cough, or new SHORTNESS OF BREATH).    VITALS: BP 126/66 (BP Location: Right Arm)   Pulse 73   Temp 98.3 F (36.8 C)   CHIEF COMPLAINT: Patient referred by Dr. Angelena Form for dental consultation.  HPI: [name] is an 83 year old female recently diagnosed with severe aortic stenosis.  Patient with anticipated TAVR procedure.  Patient is now seen as part of a medically necessary pre-heart valve surgery dental protocol examination to rule out dental infection that may affect the patient's systemic health and anticipated heart valve surgery.  The patient currently denies acute toothaches, swellings, or abscesses.  Patient was last seen by the dentist on 09/22/2018 for an extraction of tooth #11.  This was with Dr. Kalman Shan in [place], [place].  There were no complications from the dental extraction.  Patient had her last exam and cleaning in April 2019.  Patient has a history of upper and lower partial dentures that are ill fitting and "do not fit".  Patient denies having dental phobia but does have an anxiety disorder.  PROBLEM LIST: Patient Active Problem List   Diagnosis Date Noted  . Severe aortic valve stenosis     Priority: High  . Chronic apical periodontitis 12/01/2018  . Chronic periodontitis 12/01/2018  . Loose, teeth 12/01/2018  . 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 Complete heart block ([facility]) 10/20/2018  . Endometrial cancer ([facility] [facility]) 06/12/2011    PMH: Past Medical History:  Diagnosis Date  . Anxiety    Stable  . ASCUS on Pap smear 11/15/09  . Carotid artery disease ([facility])    1-39% RICA stenosis and [id number] LICA stenosis on pre TAVR dopplers   . Depression   . Endometrial polyp 11/15/2009  .  Endometrioid adenocarcinoma   . H/O varicella   . Hemorrhoids   . History of measles, mumps, or rubella   . Hypercholesterolemia   . Hypertension   . Menopausal symptoms 06/13/10  . Ovarian cyst 11/15/09  . PMB (postmenopausal bleeding) 11/15/09  . Presence of permanent cardiac pacemaker 10/20/2018  . Vaginal bleeding     PSH: Past Surgical History:  Procedure Laterality Date  . BREAST SURGERY     Over twenty - five  years ago  . CERVICAL BIOPSY    . INSERT / REPLACE / REMOVE PACEMAKER  10/20/2018  . LAPAROSCOPIC ASSISTED VAGINAL HYSTERECTOMY  12/2009   BSO  . PACEMAKER IMPLANT N/A 10/20/2018   Procedure: PACEMAKER IMPLANT;  Surgeon: Deboraha Sprang, MD;  Location: [facility];  Service: Cardiovascular;  Laterality: N/A;  . RIGHT/LEFT HEART CATH AND CORONARY ANGIOGRAPHY N/A 11/24/2018   Procedure: RIGHT/LEFT HEART CATH AND CORONARY ANGIOGRAPHY;  Surgeon: Burnell Blanks, MD;  Location: [facility];  Service: Cardiovascular;  Laterality: N/A;    ALLERGIES: No Known Allergies  MEDICATIONS: Current Outpatient Medications  Medication Sig Dispense Refill  . ALPRAZolam (XANAX) 0.25 MG tablet Take 1 tablet (0.25 mg total) by mouth 3 (three) times daily as needed for anxiety. 10 tablet 0  . amLODipine (NORVASC) 5 MG tablet Take 5 mg by mouth every evening.     . Cholecalciferol (VITAMIN D-3) 125 MCG (5000 UT) TABS Take 5,000 Units by mouth daily at 2 PM.    .  citalopram (CELEXA) 40 MG tablet Take 20 mg by mouth daily at 2 PM.     . hydrOXYzine (VISTARIL) 50 MG capsule Take 1 capsule (50 mg total) by mouth 3 (three) times daily as needed for anxiety.    Marland Kitchen MAGNESIUM PO Take 1 capsule by mouth at bedtime.    . Melatonin 5 MG TABS Take 5 mg by mouth at bedtime.    . Omega-3 Fatty Acids (FISH OIL) 1200 MG CAPS Take 1,200 mg by mouth daily at 2 PM.    . simvastatin (ZOCOR) 20 MG tablet Take 20 mg by mouth daily at 6 PM.     . vitamin B-12 (CYANOCOBALAMIN) 500 MCG tablet  Take 500 mcg by mouth daily at 2 PM.     No current facility-administered medications for this visit.     LABS: Lab Results  Component Value Date   WBC 6.0 11/19/2018   HGB 13.9 11/24/2018   HCT 41.0 11/24/2018   MCV 90 11/19/2018   PLT 151 11/19/2018      Component Value Date/Time   NA 143 11/24/2018 0848   NA 140 11/19/2018 1301   K 3.7 11/24/2018 0848   CL 101 11/19/2018 1301   CO2 24 11/19/2018 1301   GLUCOSE 93 11/19/2018 1301   GLUCOSE 111 (H) 10/20/2018 1303   BUN 11 11/19/2018 1301   CREATININE 0.69 11/19/2018 1301   CALCIUM 9.3 11/19/2018 1301   GFRNONAA 82 11/19/2018 1301   GFRAA 95 11/19/2018 1301   No results found for: INR, PROTIME No results found for: PTT  SOCIAL HISTORY: Social History   Socioeconomic History  . Marital status: Legally Separated    Spouse name: Not on file  . Number of children: Not on file  . Years of education: Not on file  . Highest education level: Not on file  Occupational History  . Occupation: Retired-sales rep  Social Needs  . Financial resource strain: Not on file  . Food insecurity    Worry: Not on file    Inability: Not on file  . Transportation needs    Medical: Not on file    Non-medical: Not on file  Tobacco Use  . Smoking status: Current Every Day Smoker    Packs/day: 0.50    Years: 55.00    Pack years: 27.50    Types: Cigarettes  . Smokeless tobacco: Never Used  Substance and Sexual Activity  . Alcohol use: No  . Drug use: Not Currently  . Sexual activity: Not on file  Lifestyle  . Physical activity    Days per week: Not on file    Minutes per session: Not on file  . Stress: Not on file  Relationships  . Social Herbalist on phone: Not on file    Gets together: Not on file    Attends religious service: Not on file    Active member of club or organization: Not on file    Attends meetings of clubs or organizations: Not on file    Relationship status: Not on file  . Intimate partner  violence    Fear of current or ex partner: Not on file    Emotionally abused: Not on file    Physically abused: Not on file    Forced sexual activity: Not on file  Other Topics Concern  . Not on file  Social History Narrative  . Not on file    FAMILY HISTORY: Family History  Problem Relation Age of Onset  .  Lung cancer Brother   . Heart disease Brother   . Heart failure Mother   . Parkinson's disease Father     REVIEW OF SYSTEMS: Reviewed with the patient as per History of present illness. Psych: Patient denies having dental phobia but does have an anxiety disorder.  DENTAL HISTORY: CHIEF COMPLAINT: Patient referred by Dr. Angelena Form for dental consultation.  HPI: [name] is an 83 year old female recently diagnosed with severe aortic stenosis.  Patient with anticipated TAVR procedure.  Patient is now seen as part of a medically necessary pre-heart valve surgery dental protocol examination to rule out dental infection that may affect the patient's systemic health and anticipated heart valve surgery.  The patient currently denies acute toothaches, swellings, or abscesses.  Patient was last seen by the dentist on 09/22/2018 for an extraction of tooth #11.  This was with Dr. Kalman Shan in [place], [place].  There were no complications from the dental extraction.  Patient had her last exam and cleaning in April 2019.  Patient has a history of upper and lower partial dentures that are ill fitting and "do not fit".  Patient denies having dental phobia but does have an anxiety disorder.  DENTAL EXAMINATION: GENERAL: The patient is a well-developed, well-nourished female no acute distress. HEAD AND NECK: There is no palpable neck lymphadenopathy.  The patient denies acute TMJ symptoms. INTRAORAL EXAM: Patient has normal saliva.  Patient has bilateral mandibular lingual tori.  There is no evidence of oral abscess formation. DENTITION: Patient is missing tooth numbers 1  through 5, 7 through 16, 17 through 20, 30, and 31.  There is evidence of mandibular anterior incisal attrition. PERIODONTAL: Patient has chronic, advanced periodontal disease with plaque and calculus accumulations, generalized gingival recession, generalized tooth mobility as per dental charting form. DENTAL CARIES/SUBOPTIMAL RESTORATIONS: Dental caries are noted. ENDODONTIC: Patient currently denies acute pulpitis symptoms.  Patient does have cervical radiolucency and pathology associate with apices of tooth numbers 23, 24, and 29. CROWN AND BRIDGE: Patient had crowns on tooth numbers 28 and 29. PROSTHODONTIC: Patient has a history of upper and lower partial dentures that are ill fitting by her report.  Patient did not bring the partial dentures with her today. OCCLUSION: Patient has a poor occlusal scheme secondary to multiple missing teeth, multiple diastemas, and lack of replacement of missing teeth with clinically acceptable dental prostheses.  RADIOGRAPHIC INTERPRETATION: An orthopantogram was taken and supplemented with 7 upper and lower periapical radiographs. There are multiple missing teeth.  There is moderate to severe bone loss.  There is periapical pathology and radiolucencies noted.  There are radiopacities consistent with bilateral mandibular lingual tori.   ASSESSMENTS: 1.  Severe aortic stenosis 2.  Pre-heart valve surgery dental protocol 3.  Chronic apical periodontitis 4.  Dental caries 5.  Incisal attrition 6.  Bilateral mandibular lingual tori 7.  Chronic periodontitis with bone loss 8.  Gingival recession 9.  Accretions 10.  Loose teeth 11.  Multiple missing teeth  12.  Ill fitting partial dentures 13.  Multiple diastemas 14.  Supra eruption and drifting of the unopposed teeth into the edentulous areas 15.  Poor occlusal scheme and malocclusion 16.  Presence of pacemaker with need to avoid Cavitron. 17.  Need for antibiotic premedication prior to invasive dental  procedures due to the severe aortic stenosis and anticipated heart valve surgery.   PLAN/RECOMMENDATIONS: 1. I discussed the risks, benefits, and complications of various treatment options with the patient in relationship to her medical  and dental conditions, anticipated heart valve surgery, and risk for endocarditis. We discussed various treatment options to include no treatment, multiple extractions with alveoloplasty, pre-prosthetic surgery as indicated, periodontal therapy, dental restorations, root canal therapy, crown and bridge therapy, implant therapy, and replacement of missing teeth as indicated. The patient currently wishes to proceed with extraction of remaining teeth with alveoloplasty and pre-prosthetic surgery as needed in the operating room with general anesthesia.  This has been scheduled for Thursday, 12/09/2018 at 7:30 AM at [facility].  The patient will then proceed with heart valve surgery at the discretion of Dr. Angelena Form and Cyndia Bent.  Patient also need to follow-up with a dentist of her choice for fabrication of upper and lower complete dentures after adequate healing and once medically stable from the anticipated heart valve surgery.  2. Discussion of findings with medical team and coordination of future medical and dental care as needed.  I spent in excess of  120 minutes during the conduct of this consultation and >50% of this time involved direct face-to-face encounter for counseling and/or coordination of the patient's care.    Lenn Cal, DDS

## 2018-12-01 NOTE — Progress Notes (Signed)
Patient ID: [name], female   DOB: 1935-10-13, 83 y.o.   MRN: [id number]  [facility] CONSULTATION REPORT  Referring Provider is Deboraha Sprang, MD Primary Cardiologist is Dorris Carnes, MD PCP is Leonard Downing, MD  Chief Complaint  Patient presents with  . 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  She was admitted to [facility] on 10/20/2018 with near syncope with 2-1 AV block with intermittent complete heart block.  An echocardiogram at that time showed severe aortic stenosis with a peak velocity of 4.2 m/s, mean gradient of 42 mmHg, and calculated aortic valve area of 0.48 cm.  Left ventricular systolic function was normal with ejection fraction of greater than 65%.  She underwent successful pacemaker placement via the left subclavian vein by Dr. Caryl Comes.  She was subsequently evaluated by Dr. Angelena Form for consideration of aortic valve replacement and underwent cardiac catheterization showing mild nonobstructive coronary disease.  The mean gradient was 39 mmHg with a peak to peak gradient of 47 mmHg and an aortic valve area of 0.48 cm.  The patient is here today with her niece.  She reports progressive exertional fatigue and tiredness.  She denies any significant shortness of breath.  She has had no dizziness since her pacemaker placement.  She denies any peripheral edema.  She has had no orthopnea or PND.  She denies any chest pressure pain.  She has been very active mowing her grass and taking care of her yard.  He does have a history of dental problems and had multiple loose teeth.  She saw Dr. Enrique Sack and is scheduled for extractions later this week.   Past Medical History:  Diagnosis Date  . Anxiety    Stable  . ASCUS on Pap smear 11/15/09  . Carotid artery disease (HCC)    1-39% RICA stenosis and [id number] LICA stenosis on pre TAVR dopplers   . Depression   . Endometrial polyp 11/15/2009  . Endometrioid adenocarcinoma   . H/O varicella   . Hemorrhoids   . History of measles, mumps, or rubella   . Hypercholesterolemia   . Hypertension   . Menopausal symptoms 06/13/10  . Ovarian cyst 11/15/09  . PMB (postmenopausal bleeding) 11/15/09  . Presence of permanent cardiac pacemaker 10/20/2018  . Vaginal bleeding     Past Surgical History:  Procedure Laterality Date  . BREAST SURGERY     Over twenty - five  years ago  . CERVICAL BIOPSY    . INSERT / REPLACE / REMOVE PACEMAKER  10/20/2018  . LAPAROSCOPIC ASSISTED VAGINAL HYSTERECTOMY  12/2009   BSO  . PACEMAKER IMPLANT N/A 10/20/2018   Procedure: PACEMAKER IMPLANT;  Surgeon: Deboraha Sprang, MD;  Location: [facility];  Service: Cardiovascular;  Laterality: N/A;  . RIGHT/LEFT HEART CATH AND CORONARY ANGIOGRAPHY N/A 11/24/2018   Procedure: RIGHT/LEFT HEART CATH AND CORONARY ANGIOGRAPHY;  Surgeon: Burnell Blanks, MD;  Location: [facility];  Service: Cardiovascular;  Laterality: N/A;    Family History  Problem Relation Age of Onset  . Lung cancer Brother   . Heart disease Brother   . Heart failure Mother   . Parkinson's disease Father     Social History  Socioeconomic History  . Marital status: Legally Separated    Spouse name: Not on file  . Number of children: Not on file  . Years of education: Not on file  . Highest education level: Not on file  Occupational History  . Occupation: Retired-sales rep  Social Needs  . Financial resource strain: Not on file  . Food insecurity    Worry: Not on file    Inability: Not on file  . Transportation needs    Medical: Not on file    Non-medical: Not on file  Tobacco Use  . Smoking status: Current Every Day  Smoker    Packs/day: 0.50    Years: 55.00    Pack years: 27.50    Types: Cigarettes  . Smokeless tobacco: Never Used  Substance and Sexual Activity  . Alcohol use: No  . Drug use: Not Currently  . Sexual activity: Not on file  Lifestyle  . Physical activity    Days per week: Not on file    Minutes per session: Not on file  . Stress: Not on file  Relationships  . Social Herbalist on phone: Not on file    Gets together: Not on file    Attends religious service: Not on file    Active member of club or organization: Not on file    Attends meetings of clubs or organizations: Not on file    Relationship status: Not on file  . Intimate partner violence    Fear of current or ex partner: Not on file    Emotionally abused: Not on file    Physically abused: Not on file    Forced sexual activity: Not on file  Other Topics Concern  . Not on file  Social History Narrative  . Not on file    Current Outpatient Medications  Medication Sig Dispense Refill  . ALPRAZolam (XANAX) 0.25 MG tablet Take 1 tablet (0.25 mg total) by mouth 3 (three) times daily as needed for anxiety. 10 tablet 0  . amLODipine (NORVASC) 5 MG tablet Take 5 mg by mouth every evening.     . Cholecalciferol (VITAMIN D-3) 125 MCG (5000 UT) TABS Take 5,000 Units by mouth daily at 2 PM.    . citalopram (CELEXA) 40 MG tablet Take 20 mg by mouth daily at 2 PM.     . hydrOXYzine (VISTARIL) 50 MG capsule Take 1 capsule (50 mg total) by mouth 3 (three) times daily as needed for anxiety.    Marland Kitchen MAGNESIUM PO Take 1 capsule by mouth at bedtime.    . Melatonin 5 MG TABS Take 5 mg by mouth at bedtime.    . Omega-3 Fatty Acids (FISH OIL) 1200 MG CAPS Take 1,200 mg by mouth daily at 2 PM.    . simvastatin (ZOCOR) 20 MG tablet Take 20 mg by mouth daily at 6 PM.     . vitamin B-12 (CYANOCOBALAMIN) 500 MCG tablet Take 500 mcg by mouth daily at 2 PM.     No current facility-administered medications for this visit.     No  Known Allergies    Review of Systems:   General:  normal appetite, + decreased energy, no weight gain, no weight loss, no fever  Cardiac:  no chest pain with exertion, no chest pain at rest, noSOB with  exertion, no resting SOB, no PND, no orthopnea, no palpitations, no arrhythmia, no atrial fibrillation, no LE edema, + dizzy spells, no syncope  Respiratory:  no  shortness of breath, no home oxygen, no productive cough, no dry cough, no bronchitis, no wheezing, no hemoptysis, no asthma, no pain with inspiration or cough, no sleep apnea, no CPAP at night  GI:   no difficulty swallowing, no reflux, no frequent heartburn, no hiatal hernia, no abdominal pain, no constipation, no diarrhea, no hematochezia, no hematemesis, no melena  GU:   no dysuria,  + frequency, no urinary tract infection, no hematuria,  no kidney stones, no kidney disease  Vascular:  no pain suggestive of claudication, no pain in feet, no leg cramps, no varicose veins, no DVT, no non-healing foot ulcer  Neuro:   no stroke, no TIA's, no seizures, no headaches, no temporary blindness one eye,  no slurred speech, no peripheral neuropathy, no chronic pain, o instability of gait, no memory/cognitive dysfunction  Musculoskeletal: no arthritis, no joint swelling, no myalgias, no difficulty walking, normal mobility   Skin:   no rash, no itching, no skin infections, no pressure sores or ulcerations  Psych:   + anxiety, no depression, no nervousness, no unusual recent stress  Eyes:   no blurry vision, no floaters, no recent vision changes, does not wear glasses or contacts  ENT:   + hearing loss, + loose or painful teeth, no dentures, last saw dentist this week  Hematologic:  + easy bruising, no abnormal bleeding, no clotting disorder, no frequent epistaxis  Endocrine:  no diabetes, does not check CBG's at home       Physical Exam:   BP (!) 149/76 (BP Location: Left Arm)   Pulse 72   Temp (!) 97.5 F (36.4 C)   Resp 16   Ht 5\' 5"   (1.651 m)   Wt 117 lb (53.1 kg)   SpO2 94% Comment: RA  BMI 19.47 kg/m   General:  This woman,   well-appearing  HEENT:  Unremarkable, NCAT, PERLA, EOMI  Neck:   no JVD, no bruits, no adenopathy or thyromegaly  Chest:   clear to auscultation, symmetrical breath sounds, no wheezes, no rhonchi   CV:   RRR, grade III/VI crescendo/decrescendo murmur heard best at RSB,  no diastolic murmur  Abdomen:  soft, non-tender, no masses   Extremities:  warm, well-perfused, pulses not palpable in feet, no LE edema  Rectal/GU  Deferred  Neuro:   Grossly non-focal and symmetrical throughout  Skin:   Clean and dry, no rashes, no breakdown   Diagnostic Tests:  ECHOCARDIOGRAM REPORT       Patient Name:   AMIRE ABRAM Date of Exam: 10/20/2018 Medical Rec #:  [id number]      Height:       66.0 in Accession #:    [id number]     Weight:       116.0 lb Date of Birth:  [birth year]       BSA:          1.59 m Patient Age:    83 years       BP:           158/46 mmHg Patient Gender: F              HR:           63 bpm. Exam Location:  Inpatient    Procedure: 2D Echo, Cardiac Doppler and Color Doppler  STAT ECHO  Indications:    R94.31 Abnormal EKG   History:        Patient has no prior history of Echocardiogram examinations.  Aortic Valve Disease Risk Factors: Hypertension and                 Dyslipidemia.   Sonographer:    Tiffany Dance Referring Phys: [id number] [place]    1. The left ventricle has hyperdynamic systolic function, with an ejection fraction of >65%. The cavity size was normal. Left ventricular diastolic Doppler parameters are consistent with impaired relaxation.  2. The right ventricle has normal systolic function. The cavity was normal. There is no increase in right ventricular wall thickness.  3. There is mild mitral annular calcification present.  4. The aortic valve is tricuspid. Severely thickening of the aortic valve. Severe  calcifcation of the aortic valve. Aortic valve regurgitation was not assessed by color flow Doppler. Severe stenosis of the aortic valve.  5. Aortic valve peak velocity: 4.60m/s, mean gradient 89mmHg.  6. The aorta is normal in size and structure.  FINDINGS  Left Ventricle: The left ventricle has hyperdynamic systolic function, with an ejection fraction of >65%. The cavity size was normal. There is no increase in left ventricular wall thickness. Left ventricular diastolic Doppler parameters are consistent  with impaired relaxation.  Right Ventricle: The right ventricle has normal systolic function. The cavity was normal. There is no increase in right ventricular wall thickness.  Left Atrium: Left atrial size was normal in size.  Right Atrium: Right atrial size was normal in size. Right atrial pressure is estimated at 3 mmHg.  Interatrial Septum: No atrial level shunt detected by color flow Doppler.  Pericardium: There is no evidence of pericardial effusion.  Mitral Valve: The mitral valve is normal in structure. There is mild mitral annular calcification present. Mitral valve regurgitation is mild by color flow Doppler.  Tricuspid Valve: The tricuspid valve is normal in structure. Tricuspid valve regurgitation is mild by color flow Doppler.  Aortic Valve: The aortic valve is tricuspid Severely thickening of the aortic valve. Severe calcifcation of the aortic valve. Aortic valve regurgitation was not assessed by color flow Doppler. There is Severe stenosis of the aortic valve, with a  calculated valve area of 0.48 cm. Aortic valve peak velocity: 4.67m/s, mean gradient 69mmHg.  Pulmonic Valve: The pulmonic valve was normal in structure. Pulmonic valve regurgitation is not visualized by color flow Doppler.  Aorta: The aorta is normal in size and structure.  Venous: The inferior vena cava is normal in size with greater than 50% respiratory variability.     +--------------+--------++ LEFT VENTRICLE         +--------------+--------++ PLAX 2D                +--------------+--------++ LVIDd:        3.59 cm  +--------------+--------++ LVIDs:        2.53 cm  +--------------+--------++ LV PW:        0.93 cm  +--------------+--------++ LV IVS:       0.71 cm  +--------------+--------++ LVOT diam:    1.70 cm  +--------------+--------++ LV SV:        31 ml    +--------------+--------++ LV SV Index:  19.96    +--------------+--------++ LVOT Area:    2.27 cm +--------------+--------++                        +--------------+--------++  +---------------+----------++ RIGHT VENTRICLE           +---------------+----------++ RV Basal diam: 2.09 cm    +---------------+----------++ RV S prime:    13.10 cm/s +---------------+----------++  TAPSE (M-mode):2.3 cm     +---------------+----------++  +---------------+-------++-----------++ LEFT ATRIUM           Index       +---------------+-------++-----------++ LA diam:       2.80 cm1.76 cm/m  +---------------+-------++-----------++ LA Vol (A2C):  73.6 ml46.39 ml/m +---------------+-------++-----------++ LA Vol (A4C):  23.8 ml15.00 ml/m +---------------+-------++-----------++ LA Biplane Vol:45.6 ml28.74 ml/m +---------------+-------++-----------++ +------------+--------++-----------++ RIGHT ATRIUM        Index       +------------+--------++-----------++ RA Area:    9.78 cm            +------------+--------++-----------++ RA Volume:  18.90 ml11.91 ml/m +------------+--------++-----------++  +------------------+------------++ AORTIC VALVE                   +------------------+------------++ AV Area (Vmax):   0.56 cm     +------------------+------------++ AV Area (Vmean):  0.50 cm     +------------------+------------++ AV Area (VTI):    0.48 cm      +------------------+------------++ AV Vmax:          410.00 cm/s  +------------------+------------++ AV Vmean:         276.750 cm/s +------------------+------------++ AV VTI:           1.045 m      +------------------+------------++ AV Peak Grad:     67.2 mmHg    +------------------+------------++ AV Mean Grad:     36.2 mmHg    +------------------+------------++ LVOT Vmax:        101.00 cm/s  +------------------+------------++ LVOT Vmean:       60.800 cm/s  +------------------+------------++ LVOT VTI:         0.219 m      +------------------+------------++ LVOT/AV VTI ratio:0.21         +------------------+------------++ AR PHT:           744 msec     +------------------+------------++   +-------------+-------++ AORTA                +-------------+-------++ Ao Root diam:2.60 cm +-------------+-------++  +--------------+----------++  +---------------+-----------++ MITRAL VALVE              TRICUSPID VALVE            +--------------+----------++  +---------------+-----------++ MV Area (PHT):2.95 cm    TR Peak grad:  27.4 mmHg   +--------------+----------++  +---------------+-----------++ MV PHT:       74.53 msec  TR Vmax:       267.00 cm/s +--------------+----------++  +---------------+-----------++ MV Decel Time:257 msec   +--------------+----------++  +--------------+-------+ +--------------+-----------++ SHUNTS                MV E velocity:96.10 cm/s  +--------------+-------+ +--------------+-----------++ Systemic VTI: 0.22 m  MV A velocity:137.00 cm/s +--------------+-------+ +--------------+-----------++ Systemic Diam:1.70 cm MV E/A ratio: 0.70        +--------------+-------+ +--------------+-----------++    Candee Furbish MD Electronically signed by Candee Furbish MD Signature Date/Time: 10/20/2018/5:16:22 PM     Physicians  Panel Physicians Referring Physician  Case Authorizing Physician  Burnell Blanks, MD (Primary)    Procedures  RIGHT/LEFT HEART CATH AND CORONARY ANGIOGRAPHY  Conclusion    Prox RCA to Mid RCA lesion is 10% stenosed.  Mid LAD lesion is 20% stenosed.   1. Mild non-obstructive CAD 2. Severe aortic stenosis (mean gradient 38.7 mmHg, peak to peak gradient 47 mmHg, AVA 0.48 cm2)  Recommendations: Will continue workup for TAVR.    Recommendations  Antiplatelet/Anticoag Continue with workup for TAVR  Indications  Severe aortic stenosis [I35.0 (ICD-10-CM)]  Procedural Details  Technical Details Indication: Severe AS, TAVR workup  Procedure: The risks, benefits, complications, treatment options, and expected outcomes were discussed with the patient. The patient and/or family concurred with the proposed plan, giving informed consent. The patient was brought to the cath lab after IV hydration was given. The patient was sedated with Versed and Fentanyl. The IV catheter in the right antecubital vein was changed for a 5 [place] sheath. Right heart cath performed with a balloon tipped catheter. The right wrist was prepped and draped in a sterile fashion. 1% lidocaine was used for local anesthesia. Using the modified Seldinger access technique, a 5 French sheath was placed in the right radial artery. 3 mg Verapamil was given through the sheath. 3000 units IV heparin was given. Standard diagnostic catheters were used to perform selective coronary angiography. I crossed the aortic valve with an AL-1 and a Jwire. A pigtail catheter was used to measure LV pressures. The sheath was removed from the right radial artery and a Terumo hemostasis band was applied at the arteriotomy site on the right wrist.    Estimated blood loss <50 mL.   During this procedure medications were administered to achieve and maintain moderate conscious sedation while the patient's heart rate, blood pressure, and oxygen saturation were continuously  monitored and I was present face-to-face 100% of this time.  Medications (Filter: Administrations occurring from 11/24/18 0822 to 11/24/18 0910) Medication Rate/Dose/Volume Action  Date Time   Heparin (Porcine) in NaCl 1000-0.9 UT/500ML-% SOLN (mL) 500 mL Given 11/24/18 0828   Total dose as of 11/24/18 0910 500 mL Given 0828   1,000 mL        fentaNYL (SUBLIMAZE) injection (mcg) 25 mcg Given 11/24/18 0829   Total dose as of 11/24/18 0910        25 mcg        midazolam (VERSED) injection (mg) 1 mg Given 11/24/18 0829   Total dose as of 11/24/18 0910        1 mg        lidocaine (PF) (XYLOCAINE) 1 % injection (mL) 2 mL Given 11/24/18 0837   Total dose as of 11/24/18 0910 2 mL Given 0839   4 mL        heparin injection (Units) 3,000 Units Given 11/24/18 0849   Total dose as of 11/24/18 0910        3,000 Units        iohexol (OMNIPAQUE) 350 MG/ML injection (mL) 40 mL Given 11/24/18 0859   Total dose as of 11/24/18 0910        40 mL        Radial Cocktail/Verapamil only (mL) 10 mL Given 11/24/18 0841   Total dose as of 11/24/18 0910        10 mL        Sedation Time  Sedation Time Physician-1: 30 minutes 27 seconds  Complications  Complications documented before study signed (11/24/2018 9:21 AM)   RIGHT/LEFT HEART CATH AND CORONARY ANGIOGRAPHY  None Documented by Burnell Blanks, MD 11/24/2018 9:06 AM  Date Found: 11/24/2018  Time Range: Intraprocedure      Coronary Findings  Diagnostic Dominance: Right Left Anterior Descending  Vessel is large.  Mid LAD lesion 20% stenosed  Mid LAD lesion is 20% stenosed.  Left Circumflex  Vessel is large.  Right Coronary Artery  Vessel is large.  Prox RCA to Mid RCA lesion 10% stenosed  Prox RCA to Mid RCA lesion is 10% stenosed.  Intervention  No interventions have been documented. Coronary Diagrams  Diagnostic Dominance: Right  Intervention  Implants   No implant documentation for this case.  Syngo Images   Show images for CARDIAC CATHETERIZATION  Images on Long Term Storage  Show images for [place], [place] to Procedure Log  Procedure Log    Hemo Data   Most Recent Value  Fick Cardiac Output 3.33 L/min  Fick Cardiac Output Index 2.12 (L/min)/BSA  Aortic Mean Gradient 38.7 mmHg  Aortic Peak Gradient 47 mmHg  Aortic Valve Area 0.46  Aortic Value Area Index 0.29 cm2/BSA  RA A Wave 14 mmHg  RA V Wave 6 mmHg  RA Mean 7 mmHg  RV Systolic Pressure 32 mmHg  RV Diastolic Pressure 3 mmHg  RV EDP 7 mmHg  PA Systolic Pressure 38 mmHg  PA Diastolic Pressure 6 mmHg  PA Mean 25 mmHg  PW A Wave 13 mmHg  PW V Wave 10 mmHg  PW Mean 11 mmHg  AO Systolic Pressure 123XX123 mmHg  AO Diastolic Pressure 30 mmHg  AO Mean 73 mmHg  LV Systolic Pressure 123456 mmHg  LV Diastolic Pressure 7 mmHg  LV EDP 13 mmHg  AOp Systolic Pressure A999333 mmHg  AOp Diastolic Pressure 48 mmHg  AOp Mean Pressure 72 mmHg  LVp Systolic Pressure 0000000 mmHg  LVp Diastolic Pressure 12 mmHg  LVp EDP Pressure 25 mmHg  QP/QS 1  TPVR Index 11.81 HRUI  TSVR Index 34.51 HRUI  PVR SVR Ratio 0.21  TPVR/TSVR Ratio 0.34    ADDENDUM REPORT: 11/26/2018 12:54  CLINICAL DATA:  83 year old female with severe aortic stenosis being evaluated for a TAVR procedure.  EXAM: Cardiac TAVR CT  TECHNIQUE: The patient was scanned on a [employer]. A 120 kV retrospective scan was triggered in the descending thoracic aorta at 111 HU's. Gantry rotation speed was 250 msecs and collimation was .6 mm. No beta blockade or nitro were given. The 3D data set was reconstructed in 5% intervals of the R-R cycle. Systolic and diastolic phases were analyzed on a dedicated work station using MPR, MIP and VRT modes. The patient received 80 cc of contrast.  FINDINGS: Aortic Valve: Trileaflet aortic valve with severely thickened and calcified leaflets and no calcifications extending into the LVOT.  Aorta: Normal size with moderate  diffuse atherosclerotic plaque and calcifications predominantly in the aortic arch and descending aorta. No dissection.  Sinotubular Junction: 25 x 23 mm  Ascending Thoracic Aorta: 32 x 30 mm  Aortic Arch: 25 x 24 mm  Descending Thoracic Aorta: 21 x 20 mm  Sinus of Valsalva Measurements:  Non-coronary: 27 mm  Right -coronary: 25 mm  Left -coronary: 27 mm  Coronary Artery Height above Annulus:  Left Main: 14 mm  Right Coronary: 15 mm  Virtual Basal Annulus Measurements:  Maximum/Minimum Diameter: 23.3 x 19.7 mm  Mean Diameter: 21.2 mm  Perimeter: 67.3 mm  Area: 354 mm2  Optimum Fluoroscopic Angle for Delivery: LAO 4 CAU 7  IMPRESSION: 1. Trileaflet aortic valve with severely thickened and calcified leaflets and no calcifications extending into the LVOT. Aortic valve calcium score was 1897 consistent with severe aortic stenosis. Annular measurements are suitable for delivery of a 23 mm Edwards-SAPIEN 3 Ultra valve.  2. Sufficient coronary to annulus distance.  3. Optimum Fluoroscopic Angle for Delivery: LAO 4 CAU 7.  4. No thrombus in the left atrial appendage.  5. Pacemaker leads are seen in the right atrium and right ventricle.   Electronically  Signed   By: Ena Dawley   On: 11/26/2018 12:54   Addended by Dorothy Spark, MD on 11/26/2018 12:56 PM    Study Result  EXAM: OVER-READ INTERPRETATION  CT CHEST  The following report is an over-read performed by radiologist Dr. Vinnie Langton of [facility], [facility] on 11/26/2018. This over-read does not include interpretation of cardiac or coronary anatomy or pathology. The coronary calcium score/coronary CTA interpretation by the cardiologist is attached.  COMPARISON:  None.  FINDINGS: Extracardiac findings will be described separately under dictation for contemporaneously obtained CTA chest, abdomen and pelvis.  IMPRESSION: Please see separate dictation  for contemporaneously obtained CTA chest, abdomen and pelvis 11/26/2018 for full description of relevant extracardiac findings.  Electronically Signed: By: Vinnie Langton M.D. On: 11/26/2018 09:22       CLINICAL DATA:  83 year old female with history of severe aortic stenosis. Preprocedural study prior to potential transcatheter aortic valve replacement (TAVR) procedure.  EXAM: CT ANGIOGRAPHY CHEST, ABDOMEN AND PELVIS  TECHNIQUE: Multidetector CT imaging through the chest, abdomen and pelvis was performed using the standard protocol during bolus administration of intravenous contrast. Multiplanar reconstructed images and MIPs were obtained and reviewed to evaluate the vascular anatomy.  CONTRAST:  36mL OMNIPAQUE IOHEXOL 350 MG/ML SOLN  COMPARISON:  CT the abdomen and pelvis 02/19/2010.  FINDINGS: CTA CHEST FINDINGS  Cardiovascular: Heart size is normal. There is no significant pericardial fluid, thickening or pericardial calcification. There is aortic atherosclerosis, as well as atherosclerosis of the great vessels of the mediastinum and the coronary arteries, including calcified atherosclerotic plaque in the left anterior descending, left circumflex and right coronary arteries. Severe thickening calcification of the aortic valve. Moderate calcifications of the mitral annulus. Left-sided pacemaker device in place with lead tips terminating in the right atrial appendage and right ventricular apex.  Mediastinum/Lymph Nodes: No pathologically enlarged mediastinal or hilar lymph nodes. Esophagus is unremarkable in appearance. No axillary lymphadenopathy.  Lungs/Pleura: No suspicious pulmonary nodules or masses are noted. No acute consolidative airspace disease. No pleural effusions.  Musculoskeletal/Soft Tissues: There are no aggressive appearing lytic or blastic lesions noted in the visualized portions of the skeleton.  CTA ABDOMEN AND PELVIS FINDINGS   Hepatobiliary: No suspicious cystic or solid hepatic lesions. No intra or extrahepatic biliary ductal dilatation. Gallbladder is normal in appearance.  Pancreas: No pancreatic mass. No pancreatic ductal dilatation. No pancreatic or peripancreatic fluid collections or inflammatory changes.  Spleen: Unremarkable.  Adrenals/Urinary Tract: Subcentimeter low-attenuation lesion in the lateral aspect of the interpolar region of the right kidney, too small to characterize, but statistically likely to represent a cyst. Left kidney and bilateral adrenal glands are normal in appearance. No hydroureteronephrosis. Urinary bladder is normal in appearance.  Stomach/Bowel: Normal appearance of the stomach. No pathologic dilatation of small bowel or colon. Numerous colonic diverticulae are noted, without surrounding inflammatory changes to suggest an acute diverticulitis at this time. Normal appendix.  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 No aneurysm or dissection noted in the abdominal or pelvic vasculature. Large ulcerated plaque in the proximal abdominal aorta adjacent to the origin of the superior mesenteric artery. No lymphadenopathy noted in the abdomen or pelvis.  Reproductive: Status post hysterectomy. Ovaries are not confidently identified may be surgically absent or atrophic.  Other: No significant volume of ascites.  No pneumoperitoneum.  Musculoskeletal: There are no aggressive appearing lytic or blastic lesions noted in  the visualized portions of the skeleton.  VASCULAR MEASUREMENTS PERTINENT TO TAVR:  AORTA:  Minimal Aortic Diameter-6.3 x 4.4 mm  Severity of Aortic Calcification-severe  RIGHT PELVIS:  Right Common Iliac Artery -  Minimal Diameter-occluded  Tortuosity-mild   Calcification-severe  Right External Iliac Artery -  Minimal Diameter-3.7 x 3.7 mm  Tortuosity - mild  Calcification-moderate  Right Common Femoral Artery -  Minimal Diameter-5.1 x 4.2 mm  Tortuosity - mild  Calcification-moderate  LEFT PELVIS:  Left Common Iliac Artery -  Minimal Diameter-5.9 x 3.3 mm  Tortuosity - mild  Calcification-moderate  Left External Iliac Artery -  Minimal Diameter-4.5 x 5.0 mm  Tortuosity - mild  Calcification-moderate  Left Common Femoral Artery -  Minimal Diameter-5.5 x 4.9 mm  Tortuosity - mild  Calcification-mild-to-moderate  Review of the MIP images confirms the above findings.  IMPRESSION: 1. 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 2. Severe thickening calcification of the aortic valve, compatible with the reported clinical history of severe aortic stenosis. 3. Aortic atherosclerosis, in addition to 3 vessel coronary artery disease. 4. Colonic diverticulosis without evidence of acute diverticulitis at this time. 5. Additional incidental findings, as above.   Electronically Signed   By: Vinnie Langton M.D.   On: 11/26/2018 09:58  STS Risk Score: Risk of Mortality: 3.323% Renal Failure: 1.807% Permanent Stroke: 1.142% Prolonged Ventilation: 8.809% DSW Infection: 0.089% Reoperation: 3.191% Morbidity or Mortality: 13.176% Short Length of Stay: 28.155% Long Length of Stay: 6.069%   Impression:  This 83 year old woman has stage D, severe, symptomatic aortic stenosis with New York Heart Association class II symptoms of exertional fatigue and tiredness consistent with chronic diastolic congestive heart failure.  I have personally reviewed her 2D echocardiogram, cardiac catheterization, and CTA studies.  Her 2D echocardiogram shows a trileaflet aortic valve with severe thickening  and calcification and restricted mobility.  The mean gradient across aortic valve is 42 mmHg consistent with severe aortic stenosis.  Left ventricular systolic function was normal.  Cardiac catheterization shows mild nonobstructive coronary disease.  The mean gradient across aortic valve is 39 mmHg with a peak to peak gradient of 47 mmHg and an aortic valve area of 0.48 cm consistent with severe aortic stenosis.  I agree that aortic valve replacement is indicated in this patient.  Given her advanced age I think transcatheter aortic valve replacement is probably the best option for her.  Her gated cardiac CTA shows anatomy suitable for transcatheter aortic valve replacement using a Sapien 3 valve.  Unfortunately her abdominal and pelvic CTA shows occlusion of the right common iliac artery and severe concentric calcified atherosclerosis of the left common iliac artery which precludes transfemoral insertion.  Her left subclavian artery appears to be of adequate size although she recently had a left subclavian vein permanent pacemaker placement so I think it is probably better to avoid the site.  Her left common carotid artery appears to be of adequate size but she has a 60 to 79% left internal carotid artery stenosis.  The right carotid artery also appears to be of adequate size.  The right subclavian artery appears to be of adequate size but making the turn into the innominate artery may be difficult.  Transaortic or transapical are other options available if necessary.  The patient was counseled at length regarding treatment alternatives for management of severe symptomatic aortic stenosis. The risks and benefits of surgical intervention has been  discussed in detail. Long-term prognosis with medical therapy was discussed. Alternative approaches such as conventional surgical aortic valve replacement, transcatheter aortic valve replacement, and palliative medical therapy were compared and contrasted at length. This  discussion was placed in the context of the patient's own specific clinical presentation and past medical history. All of her questions have been addressed.   Following the decision to proceed with transcatheter aortic valve replacement, a discussion was held regarding what types of management strategies would be attempted intraoperatively in the event of life-threatening complications, including whether or not the patient would be considered a candidate for the use of cardiopulmonary bypass and/or conversion to open sternotomy for attempted surgical intervention.  She is 82 years old but is a low risk surgical patient and would be a candidate for emergent sternotomy to manage any intraoperative complications.  The patient has been advised of a variety of complications that might develop including but not limited to risks of death, stroke, paravalvular leak, aortic dissection or other major vascular complications, aortic annulus rupture, device embolization, cardiac rupture or perforation, mitral regurgitation, acute myocardial infarction, arrhythmia, heart block or bradycardia requiring permanent pacemaker placement, congestive heart failure, respiratory failure, renal failure, pneumonia, infection, other late complications related to structural valve deterioration or migration, or other complications that might ultimately cause a temporary or permanent loss of functional independence or other long term morbidity. The patient provides full informed consent for the procedure as described and all questions were answered.     Plan:  She is going to have her teeth pulled later this week and would then be scheduled for transcatheter aortic valve replacement via alternative access when she is recovered from her extractions.  I spent 60 minutes performing this consultation and > 50% of this time was spent face to face counseling and coordinating the care of this patient's severe symptomatic aortic stenosis.      Gaye Pollack, MD 12/01/2018

## 2018-12-03 NOTE — Pre-Procedure Instructions (Signed)
[place], [place] [address] Phone: [phone] Fax: [phone]      Your procedure is scheduled on  12-09-18 Thursday from 0730-0930AM  Report to [facility] Main Entrance "A" at 0530  A.M., and check in at the Admitting office.  Call this number if you have problems the morning of surgery:  [phone]  Call [phone] if you have any questions prior to your surgery date Monday-Friday 8am-4pm    Remember:  Do not eat or drink after midnight the night before your surgery  Take these medicines the morning of surgery with A SIP OF WATER : ALPRAZolam Duanne Moron) as needed   7 days prior to surgery STOP taking any Aspirin (unless otherwise instructed by your surgeon), Aleve, Naproxen, Ibuprofen, Motrin, Advil, Goody's, BC's, all herbal medications, fish oil, and all vitamins.    The Morning of Surgery  Do not wear jewelry, make-up or nail polish.  Do not wear lotions, powders, or perfumes/colognes, or deodorant  Do not shave 48 hours prior to surgery.   Do not bring valuables to the hospital.  [facility] is not responsible for any belongings or valuables.  If you are a smoker, DO NOT Smoke 24 hours prior to surgery IF you wear a CPAP at night please bring your mask, tubing, and machine the morning of surgery   Remember that you must have someone to transport you home after your surgery, and remain with you for 24 hours if you are discharged the same day.   Contacts, glasses, hearing aids, dentures or bridgework may not be worn into surgery.    Leave your suitcase in the car.  After surgery it may be brought to your room.  For patients admitted to the hospital, discharge time will be determined by your treatment team.  Patients discharged the day of surgery will not be allowed to drive home.    Special instructions:   [facility]- Preparing For Surgery  Before surgery, you  can play an important role. Because skin is not sterile, your skin needs to be as free of germs as possible. You can reduce the number of germs on your skin by washing with CHG (chlorahexidine gluconate) Soap before surgery.  CHG is an antiseptic cleaner which kills germs and bonds with the skin to continue killing germs even after washing.    Oral Hygiene is also important to reduce your risk of infection.  Remember - BRUSH YOUR TEETH THE MORNING OF SURGERY WITH YOUR REGULAR TOOTHPASTE  Please do not use if you have an allergy to CHG or antibacterial soaps. If your skin becomes reddened/irritated stop using the CHG.  Do not shave (including legs and underarms) for at least 48 hours prior to first CHG shower. It is OK to shave your face.  Please follow these instructions carefully.   1. Shower the NIGHT BEFORE SURGERY and the MORNING OF SURGERY with CHG Soap.   2. If you chose to wash your hair, wash your hair first as usual with your normal shampoo.  3. After you shampoo, rinse your hair and body thoroughly to remove the shampoo.  4. Use CHG as you would any other liquid soap. You can apply CHG directly to the skin and wash gently with a scrungie or a clean washcloth.   5. Apply the CHG Soap to your body ONLY FROM THE NECK DOWN.  Do not use on open wounds or open sores.  Avoid contact with your eyes, ears, mouth and genitals (private parts). Wash Face and genitals (private parts)  with your normal soap.   6. Wash thoroughly, paying special attention to the area where your surgery will be performed.  7. Thoroughly rinse your body with warm water from the neck down.  8. DO NOT shower/wash with your normal soap after using and rinsing off the CHG Soap.  9. Pat yourself dry with a CLEAN TOWEL.  10. Wear CLEAN PAJAMAS to bed the night before surgery, wear comfortable clothes the morning of surgery  11. Place CLEAN SHEETS on your bed the night of your first shower and DO NOT SLEEP WITH  PETS.    Day of Surgery:  Do not apply any deodorants/lotions. Please shower the morning of surgery with the CHG soap  Please wear clean clothes to the hospital/surgery center.   Remember to brush your teeth WITH YOUR REGULAR TOOTHPASTE.   Please read over the  fact sheets that you were given.

## 2018-12-07 ENCOUNTER — Encounter (HOSPITAL_COMMUNITY): Payer: Self-pay

## 2018-12-07 ENCOUNTER — Encounter (HOSPITAL_COMMUNITY)
Admission: RE | Admit: 2018-12-07 | Discharge: 2018-12-07 | Disposition: A | Discharge status: Home / Self Care | Payer: Medicare HMO | Source: Ambulatory Visit | Attending: Dentistry | Admitting: Dentistry

## 2018-12-07 ENCOUNTER — Other Ambulatory Visit (HOSPITAL_COMMUNITY)
Admission: RE | Admit: 2018-12-07 | Discharge: 2018-12-07 | Disposition: A | Discharge status: Home / Self Care | Payer: Medicare HMO | Source: Ambulatory Visit | Attending: Dentistry | Admitting: Dentistry

## 2018-12-07 ENCOUNTER — Other Ambulatory Visit: Payer: Self-pay

## 2018-12-07 DIAGNOSIS — K0889 Other specified disorders of teeth and supporting structures: Secondary | ICD-10-CM | POA: Diagnosis not present

## 2018-12-07 DIAGNOSIS — K045 Chronic apical periodontitis: Secondary | ICD-10-CM | POA: Diagnosis present

## 2018-12-07 DIAGNOSIS — E7800 Pure hypercholesterolemia, unspecified: Secondary | ICD-10-CM | POA: Diagnosis not present

## 2018-12-07 DIAGNOSIS — Z950 Presence of cardiac pacemaker: Secondary | ICD-10-CM | POA: Diagnosis not present

## 2018-12-07 DIAGNOSIS — Z79899 Other long term (current) drug therapy: Secondary | ICD-10-CM | POA: Diagnosis not present

## 2018-12-07 DIAGNOSIS — F17210 Nicotine dependence, cigarettes, uncomplicated: Secondary | ICD-10-CM | POA: Diagnosis not present

## 2018-12-07 DIAGNOSIS — R69 Illness, unspecified: Secondary | ICD-10-CM | POA: Diagnosis not present

## 2018-12-07 DIAGNOSIS — F419 Anxiety disorder, unspecified: Secondary | ICD-10-CM | POA: Diagnosis not present

## 2018-12-07 DIAGNOSIS — I10 Essential (primary) hypertension: Secondary | ICD-10-CM | POA: Diagnosis not present

## 2018-12-07 DIAGNOSIS — I352 Nonrheumatic aortic (valve) stenosis with insufficiency: Secondary | ICD-10-CM | POA: Diagnosis not present

## 2018-12-07 DIAGNOSIS — F329 Major depressive disorder, single episode, unspecified: Secondary | ICD-10-CM | POA: Diagnosis not present

## 2018-12-07 LAB — CBC
HCT: 51.2 % — ABNORMAL HIGH (ref 36.0–46.0)
Hemoglobin: 16.4 g/dL — ABNORMAL HIGH (ref 12.0–15.0)
MCH: 30 pg (ref 26.0–34.0)
MCHC: 32 g/dL (ref 30.0–36.0)
MCV: 93.8 fL (ref 80.0–100.0)
Platelets: 188 10*3/uL (ref 150–400)
RBC: 5.46 MIL/uL — ABNORMAL HIGH (ref 3.87–5.11)
RDW: 14.2 % (ref 11.5–15.5)
WBC: 7.8 10*3/uL (ref 4.0–10.5)
nRBC: 0 % (ref 0.0–0.2)

## 2018-12-07 LAB — BASIC METABOLIC PANEL
Anion gap: 10 (ref 5–15)
BUN: 11 mg/dL (ref 8–23)
CO2: 27 mmol/L (ref 22–32)
Calcium: 9.7 mg/dL (ref 8.9–10.3)
Chloride: 102 mmol/L (ref 98–111)
Creatinine, Ser: 0.83 mg/dL (ref 0.44–1.00)
GFR calc Af Amer: >60 mL/min (ref >60)
GFR calc non Af Amer: >60 mL/min (ref >60)
Glucose, Bld: 89 mg/dL (ref 70–99)
Potassium: 4 mmol/L (ref 3.5–5.1)
Sodium: 139 mmol/L (ref 135–145)

## 2018-12-07 LAB — SARS CORONAVIRUS 2 (TAT 6-24 HRS): SARS Coronavirus 2: NEGATIVE

## 2018-12-07 NOTE — Progress Notes (Addendum)
  Coronavirus Screening Tetsed today Have you experienced the following symptoms:  Cough yes/no: No Fever (>100.64F)  yes/no: No Runny nose yes/no: No Sore throat yes/no: No Difficulty breathing/shortness of breath  yes/no: No Loss of smell or taste-no Have you or a family member traveled in the last 14 days and where? yes/no: No  PCP - Dr Leonard Downing  Cardiologist - Dr Dorris Carnes                        Dr Angelena Form  Electrophys.-Dr Virl Axe  Chest x-ray - NA  EKG - 11-01-18  Stress Test - denies  ECHO - 10-20-18  Cardiac Cath - 11-24-18  AICD-denies PM-Medtronic. Email sent to rep. Request for Physician orders faxed LOOP-denies  Sleep Study -denies  CPAP - NA  LABS-CBC,BMP  ASA-denies  ERAS-NA  HA1C-denies Fasting Blood Sugar -  Checks Blood Sugar ___0__ times a day  Anesthesia-Y. Psychologist, forensic. Pt was hypertensive 172/57 at PAT appt. Per pt, she has been hypertensive lately and she consulted with her PCP and was advised to increase her dose of Amlodipine to 10mg /day. Pt took 5mg  BID for only 2 days. Stated that today she only took 5mg  this am. Will take the remaining dose in the evening.  Pt denies having dizziness,palpitations,chest pain, sob, or fever at this time. All instructions explained to the pt, with a verbal understanding of the material. Pt agrees to go over the instructions while at home for a better understanding. Pt also instructed to self quarantine after being tested for COVID-19. The opportunity to ask questions was provided.

## 2018-12-08 ENCOUNTER — Encounter (HOSPITAL_COMMUNITY): Payer: Self-pay | Admitting: Anesthesiology

## 2018-12-08 ENCOUNTER — Other Ambulatory Visit: Payer: Self-pay

## 2018-12-08 DIAGNOSIS — I350 Nonrheumatic aortic (valve) stenosis: Secondary | ICD-10-CM

## 2018-12-08 NOTE — Progress Notes (Signed)
Anesthesia Chart Review:  Case: [id number] Date/Time: 12/09/18 0715   Procedure: MULTIPLE EXTRACTION WITH ALVEOLOPLASTY PER PROSTHETIC SURGERY AS NEEDED (N/A )   Anesthesia type: General   Pre-op diagnosis: SEVERE AORTIC STENOSIS, CHRONIC PERIODONTITIS   Location: [facility] ROOM 06 / [place] OR   Surgeon: Lenn Cal, DDS      DISCUSSION: Patient is an 83 year old female scheduled for the above procedure. She is being considered for future TAVR for severe AS. According to notes, she "wishes to proceed with extraction of remaining teeth with alveoloplasty and pre-prosthetic surgery as needed".  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   I called and spoke with patient. She denied any acute CV symptoms.  Feels generally "tired" with activity, but no recent changes. Had phlegm stuck in the back of her throat after hysterectomy which caused her a lot of anxiety. Also if she feels restrained in her positioning then that will also cause anxiety. Denied known history of difficult intubation. She is not on ASA or blood thinners. Denied epistaxis or nasal/skull fracture history. Discussed possibility of nasoendotracheal intubation for this procedure. Definitive anesthesia plan following day of surgery evaluation. Her niece will be bringing her for surgery.    12/07/18 COVID-19 test negative. Medtronic perioperative Rx form is still pending. If no acute changes then I would anticipate she could proceed as planned.    VS: BP (!) 172/57 Comment: pt states that she has taken bp meds today/notified Sapna K. RN  Pulse 86   Temp 36.7 C   Resp 18   Ht 5\' 5"  (1.651 m)   Wt 49.9 kg   SpO2 94%   BMI 18.30 kg/m  SBP at home has been running ~ 130-160's. She takes amlodipine 5 mg in the  morning and 5 mg in the evening (if SBP ~ > 140). Advised she could take amlodipine 5 mg on the morning of surgery with sips of water if she would normally do so.   PROVIDERS: Leonard Downing, MD is PCP Dorris Carnes, MD is primary cardiologist Virl Axe, MD is EP cardiologist Lauree Chandler, MD and Gilford Raid, MD are evaluating for possible future TAVR     LABS: Labs reviewed: Acceptable for surgery. (all labs ordered are listed, but only abnormal results are displayed)  Labs Reviewed  CBC - Abnormal; Notable for the following components:      Result Value   RBC 5.46 (*)    Hemoglobin 16.4 (*)    HCT 51.2 (*)    All other components within normal limits  BASIC METABOLIC PANEL     IMAGES: CTA chest/abd/pelvis 11/26/18: IMPRESSION: 1. 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 2. Severe thickening calcification of the aortic valve, compatible with the reported clinical history of severe aortic stenosis. 3. Aortic atherosclerosis, in addition to 3 vessel coronary artery disease. 4. Colonic diverticulosis without evidence of acute diverticulitis at this time. 5. Additional incidental findings, as above. [See full report Result Review tab]  CXR 10/21/18: FINDINGS: Interval placement of left ICD with leads in the right atrium and right ventricle. No pneumothorax. Hyperinflation. Heart is normal size. No confluent opacities or effusions. IMPRESSION: Left ICD placement.  No pneumothorax. Hyperinflation. - She had a PACEMAKER (not ICD)  placed 10/20/18   EKG: 11/01/18 ([facility]): Atrial-sensed ventricular paced rhythm with prolonged AV conduction   CV: Carotid US 11/26/18: Summary: Right Carotid: Velocities in the right ICA are consistent with a 1-39% stenosis. Left Carotid: Velocities in the left ICA are consistent with a 60-79%  stenosis.   CT coronary 11/26/18: IMPRESSION: 1. Trileaflet aortic valve with severely thickened and calcified leaflets and no calcifications extending into the LVOT. Aortic valve calcium score was 1897 consistent with severe aortic stenosis. Annular measurements are suitable for delivery of a 23 mm Edwards-SAPIEN 3 Ultra valve. 2. Sufficient coronary to annulus distance. 3. Optimum Fluoroscopic Angle for Delivery: LAO 4 CAU 7. 4. No thrombus in the left atrial appendage. 5. Pacemaker leads are seen in the right atrium and right ventricle.   Cardiac cath 11/24/18: Conclusions:  Prox RCA to Mid RCA lesion is 10% stenosed.  Mid LAD lesion is 20% stenosed. 1. Mild non-obstructive CAD 2. Severe aortic stenosis (mean gradient 38.7 mmHg, peak to peak gradient 47 mmHg, AVA 0.48 cm2) Recommendations: Will continue workup for TAVR.    Echo 10/20/18: IMPRESSIONS  1. The left ventricle has hyperdynamic systolic function, with an ejection fraction of >65%. The cavity size was normal. Left ventricular diastolic Doppler parameters are consistent with impaired relaxation.  2. The right ventricle has normal systolic function. The cavity was normal. There is no increase in right ventricular wall thickness.  3. There is mild mitral annular calcification present.  4. The aortic valve is tricuspid. Severely thickening of the aortic valve. Severe calcifcation of the aortic valve. Aortic valve regurgitation was not assessed by color flow Doppler. Severe stenosis of the aortic valve. AV Area (Vmax):   0.56 cm     AV Area (Vmean):  0.50 cm     AV Area (VTI):    0.48 cm     AV Vmax:          410.00 cm/s  AV Vmean:         276.750 cm/s AV VTI:           1.045 m      AV Peak Grad:     67.2 mmHg    AV Mean Grad:     36.2 mmHg    5. Aortic valve peak velocity: 4.56m/s, mean gradient 68mmHg.  6. The aorta is normal in size and structure.    Past Medical History:  Diagnosis Date  .  Anxiety    Stable  . ASCUS on Pap smear 11/15/09  . Carotid artery disease (HCC)    1-39% RICA stenosis and [id number] LICA stenosis on pre TAVR dopplers   . Depression   . Endometrial polyp 11/15/2009  . Endometrioid adenocarcinoma   . H/O varicella   . Hemorrhoids   . History of measles, mumps, or rubella   . Hypercholesterolemia   . Hypertension   . Menopausal symptoms 06/13/10  . Ovarian cyst 11/15/09  . PMB (postmenopausal bleeding) 11/15/09  . Presence of permanent cardiac pacemaker 10/20/2018  . Vaginal bleeding     Past Surgical History:  Procedure Laterality Date  . BREAST SURGERY     Over twenty - five  years ago  . CERVICAL BIOPSY    . INSERT / REPLACE / REMOVE PACEMAKER  10/20/2018  . LAPAROSCOPIC ASSISTED VAGINAL HYSTERECTOMY  12/2009   BSO  . PACEMAKER IMPLANT N/A 10/20/2018   Procedure: PACEMAKER IMPLANT;  Surgeon: Deboraha Sprang, MD;  Location: [facility];  Service: Cardiovascular;  Laterality:  N/A;  . RIGHT/LEFT HEART CATH AND CORONARY ANGIOGRAPHY N/A 11/24/2018   Procedure: RIGHT/LEFT HEART CATH AND CORONARY ANGIOGRAPHY;  Surgeon: Burnell Blanks, MD;  Location: [facility];  Service: Cardiovascular;  Laterality: N/A;    MEDICATIONS: . acetaminophen (TYLENOL) 500 MG tablet  . ALPRAZolam (XANAX) 0.25 MG tablet  . amLODipine (NORVASC) 5 MG tablet  . carboxymethylcellulose (REFRESH PLUS) 0.5 % SOLN  . Cholecalciferol (VITAMIN D-3) 125 MCG (5000 UT) TABS  . citalopram (CELEXA) 40 MG tablet  . hydrOXYzine (VISTARIL) 50 MG capsule  . MAGNESIUM PO  . Melatonin 5 MG TABS  . Omega-3 Fatty Acids (FISH OIL) 1200 MG CAPS  . simvastatin (ZOCOR) 20 MG tablet  . vitamin B-12 (CYANOCOBALAMIN) 500 MCG tablet   No current facility-administered medications for this encounter.     Myra Gianotti, PA-C Surgical Short Stay/Anesthesiology [facility] Phone [phone] [facility] Phone [phone] 12/08/2018 9:54 AM

## 2018-12-08 NOTE — Anesthesia Preprocedure Evaluation (Addendum)
Anesthesia Evaluation  Patient identified by MRN, date of birth, ID band Patient awake    Reviewed: Allergy & Precautions, NPO status , Patient's Chart, lab work & pertinent test results  Airway Mallampati: II  TM Distance: >3 FB Neck ROM: Full    Dental  (+) Poor Dentition, Missing, Chipped, Dental Advisory Given   Pulmonary Current Smoker and Patient abstained from smoking.,    Pulmonary exam normal breath sounds clear to auscultation       Cardiovascular hypertension, Pt. on medications Normal cardiovascular exam+ dysrhythmias + pacemaker  Rhythm:Regular Rate:Normal + Systolic murmurs Hx/o high grade AV Block S/P PPM  Echo 1. The left ventricle has hyperdynamic systolic function, with an ejection fraction of >65%. The cavity size was normal. Left ventricular diastolic Doppler parameters are consistent with impaired relaxation.  2. The right ventricle has normal systolic function. The cavity was normal. There is no increase in right ventricular wall thickness.  3. There is mild mitral annular calcification present.  4. The aortic valve is tricuspid. Severely thickening of the aortic valve. Severe calcifcation of the aortic valve. Aortic valve regurgitation was not assessed by color flow Doppler. Severe stenosis of the aortic valve.  5. Aortic valve peak velocity: 4.52m/s, mean gradient 20mmHg. AVA 0.48cm2 peak gradient 83mm Hg  6. The aorta is normal in size and structure.  LICA stenosis - moderate 60-79%  Cardiac Cath 11/24/2018   Prox RCA to Mid RCA lesion is 10% stenosed.  Mid LAD lesion is 20% stenosed.    EKG 10/2018 Atrial sensed, Ventricular paced   Neuro/Psych PSYCHIATRIC DISORDERS Anxiety Depression negative neurological ROS     GI/Hepatic Neg liver ROS, Chronic periodontal disease   Endo/Other  negative endocrine ROS  Renal/GU negative Renal ROS  negative genitourinary   Musculoskeletal negative  musculoskeletal ROS (+)   Abdominal   Peds  Hematology negative hematology ROS (+)   Anesthesia Other Findings   Reproductive/Obstetrics Hx/ endometrial Ca S/P hysterectomy                         Anesthesia Physical Anesthesia Plan  ASA: IV  Anesthesia Plan: General   Post-op Pain Management:    Induction: Intravenous  PONV Risk Score and Plan: 2 and Ondansetron and Treatment may vary due to age or medical condition  Airway Management Planned: Nasal ETT  Additional Equipment: Arterial line  Intra-op Plan:   Post-operative Plan: Extubation in OR  Informed Consent: I have reviewed the patients History and Physical, chart, labs and discussed the procedure including the risks, benefits and alternatives for the proposed anesthesia with the patient or authorized representative who has indicated his/her understanding and acceptance.     Dental advisory given  Plan Discussed with: CRNA and Surgeon  Anesthesia Plan Comments: (PAT note written 12/08/2018 by Myra Gianotti, PA-C.  Etomidate for induction )     Anesthesia Quick Evaluation

## 2018-12-09 ENCOUNTER — Encounter (HOSPITAL_COMMUNITY)
Admission: RE | Disposition: A | Discharge status: Home / Self Care | Payer: Self-pay | Source: Home / Self Care | Attending: Dentistry

## 2018-12-09 ENCOUNTER — Encounter (HOSPITAL_COMMUNITY): Payer: Self-pay | Admitting: Certified Registered"

## 2018-12-09 ENCOUNTER — Ambulatory Visit (HOSPITAL_COMMUNITY): Payer: Medicare HMO | Admitting: Certified Registered"

## 2018-12-09 ENCOUNTER — Ambulatory Visit (HOSPITAL_COMMUNITY): Payer: Medicare HMO | Admitting: Vascular Surgery

## 2018-12-09 ENCOUNTER — Ambulatory Visit (HOSPITAL_COMMUNITY)
Admission: RE | Admit: 2018-12-09 | Discharge: 2018-12-09 | Disposition: A | Discharge status: Home / Self Care | Payer: Medicare HMO | Attending: Dentistry | Admitting: Dentistry

## 2018-12-09 ENCOUNTER — Other Ambulatory Visit: Payer: Self-pay

## 2018-12-09 DIAGNOSIS — F329 Major depressive disorder, single episode, unspecified: Secondary | ICD-10-CM | POA: Insufficient documentation

## 2018-12-09 DIAGNOSIS — I10 Essential (primary) hypertension: Secondary | ICD-10-CM | POA: Diagnosis not present

## 2018-12-09 DIAGNOSIS — I350 Nonrheumatic aortic (valve) stenosis: Secondary | ICD-10-CM | POA: Diagnosis not present

## 2018-12-09 DIAGNOSIS — K0889 Other specified disorders of teeth and supporting structures: Secondary | ICD-10-CM | POA: Diagnosis not present

## 2018-12-09 DIAGNOSIS — F419 Anxiety disorder, unspecified: Secondary | ICD-10-CM | POA: Insufficient documentation

## 2018-12-09 DIAGNOSIS — Z79899 Other long term (current) drug therapy: Secondary | ICD-10-CM | POA: Insufficient documentation

## 2018-12-09 DIAGNOSIS — M278 Other specified diseases of jaws: Secondary | ICD-10-CM | POA: Diagnosis not present

## 2018-12-09 DIAGNOSIS — K0530 Chronic periodontitis, unspecified: Secondary | ICD-10-CM | POA: Diagnosis not present

## 2018-12-09 DIAGNOSIS — K045 Chronic apical periodontitis: Secondary | ICD-10-CM | POA: Diagnosis not present

## 2018-12-09 DIAGNOSIS — M270 Developmental disorders of jaws: Secondary | ICD-10-CM | POA: Diagnosis not present

## 2018-12-09 DIAGNOSIS — I352 Nonrheumatic aortic (valve) stenosis with insufficiency: Secondary | ICD-10-CM | POA: Diagnosis not present

## 2018-12-09 DIAGNOSIS — R69 Illness, unspecified: Secondary | ICD-10-CM | POA: Diagnosis not present

## 2018-12-09 DIAGNOSIS — F17210 Nicotine dependence, cigarettes, uncomplicated: Secondary | ICD-10-CM | POA: Insufficient documentation

## 2018-12-09 DIAGNOSIS — E7800 Pure hypercholesterolemia, unspecified: Secondary | ICD-10-CM | POA: Insufficient documentation

## 2018-12-09 DIAGNOSIS — Z950 Presence of cardiac pacemaker: Secondary | ICD-10-CM | POA: Diagnosis not present

## 2018-12-09 HISTORY — PX: MULTIPLE EXTRACTIONS WITH ALVEOLOPLASTY: SHX5342

## 2018-12-09 SURGERY — MULTIPLE EXTRACTION WITH ALVEOLOPLASTY
Anesthesia: General | Site: Mouth

## 2018-12-09 MED ORDER — OXYMETAZOLINE HCL 0.05 % NA SOLN
NASAL | Status: AC
  Filled 2018-12-09: qty 30

## 2018-12-09 MED ORDER — OXYMETAZOLINE HCL 0.05 % NA SOLN: NASAL | Status: DC | PRN

## 2018-12-09 MED ORDER — MIDAZOLAM HCL 2 MG/2ML IJ SOLN
INTRAMUSCULAR | Status: AC
  Filled 2018-12-09: qty 2

## 2018-12-09 MED ORDER — ONDANSETRON HCL 4 MG/2ML IJ SOLN
INTRAMUSCULAR | Status: AC
  Filled 2018-12-09: qty 2

## 2018-12-09 MED ORDER — THROMBIN 5000 UNITS EX SOLR
CUTANEOUS | Status: AC
  Filled 2018-12-09: qty 5000

## 2018-12-09 MED ORDER — PROPOFOL 10 MG/ML IV BOLUS
INTRAVENOUS | Status: AC
  Filled 2018-12-09: qty 20

## 2018-12-09 MED ORDER — STERILE WATER FOR IRRIGATION IR SOLN
Status: DC | PRN
  Administered 2018-12-09: 1000 mL

## 2018-12-09 MED ORDER — ONDANSETRON HCL 4 MG/2ML IJ SOLN
INTRAMUSCULAR | Status: DC | PRN
  Administered 2018-12-09: 4 mg via INTRAVENOUS

## 2018-12-09 MED ORDER — ETOMIDATE 2 MG/ML IV SOLN
INTRAVENOUS | Status: AC
  Filled 2018-12-09: qty 10

## 2018-12-09 MED ORDER — LIDOCAINE-EPINEPHRINE 2 %-1:100000 IJ SOLN
INTRAMUSCULAR | Status: DC | PRN
  Administered 2018-12-09 (×4): 1.7 mL via INTRADERMAL

## 2018-12-09 MED ORDER — CEFAZOLIN SODIUM-DEXTROSE 2-4 GM/100ML-% IV SOLN
INTRAVENOUS | Status: AC
  Filled 2018-12-09: qty 100

## 2018-12-09 MED ORDER — SUGAMMADEX SODIUM 200 MG/2ML IV SOLN
INTRAVENOUS | Status: DC | PRN
  Administered 2018-12-09: 150 mg via INTRAVENOUS

## 2018-12-09 MED ORDER — OXYMETAZOLINE HCL 0.05 % NA SOLN
NASAL | Status: DC | PRN
  Administered 2018-12-09 (×3): 1 via NASAL

## 2018-12-09 MED ORDER — LIDOCAINE-EPINEPHRINE 2 %-1:100000 IJ SOLN
INTRAMUSCULAR | Status: AC
  Filled 2018-12-09: qty 10.2

## 2018-12-09 MED ORDER — ROCURONIUM BROMIDE 10 MG/ML (PF) SYRINGE
PREFILLED_SYRINGE | INTRAVENOUS | Status: AC
  Filled 2018-12-09: qty 10

## 2018-12-09 MED ORDER — HYDROCODONE-ACETAMINOPHEN 7.5-325 MG/15ML PO SOLN: 10.0000 mL | Freq: Four times a day (QID) | ORAL | 0 refills | Status: AC | PRN

## 2018-12-09 MED ORDER — BUPIVACAINE-EPINEPHRINE (PF) 0.5% -1:200000 IJ SOLN
INTRAMUSCULAR | Status: AC
  Filled 2018-12-09: qty 3.6

## 2018-12-09 MED ORDER — DEXAMETHASONE SODIUM PHOSPHATE 10 MG/ML IJ SOLN
INTRAMUSCULAR | Status: AC
  Filled 2018-12-09: qty 1

## 2018-12-09 MED ORDER — GLYCOPYRROLATE PF 0.2 MG/ML IJ SOSY
PREFILLED_SYRINGE | INTRAMUSCULAR | Status: AC
  Filled 2018-12-09: qty 1

## 2018-12-09 MED ORDER — PHENYLEPHRINE 40 MCG/ML (10ML) SYRINGE FOR IV PUSH (FOR BLOOD PRESSURE SUPPORT)
PREFILLED_SYRINGE | INTRAVENOUS | Status: AC
  Filled 2018-12-09: qty 10

## 2018-12-09 MED ORDER — AMINOCAPROIC ACID SOLUTION 5% (50 MG/ML)
10.0000 mL | ORAL | Status: DC
  Filled 2018-12-09: qty 100

## 2018-12-09 MED ORDER — FENTANYL CITRATE (PF) 100 MCG/2ML IJ SOLN
INTRAMUSCULAR | Status: DC | PRN
  Administered 2018-12-09 (×2): 50 ug via INTRAVENOUS
  Administered 2018-12-09 (×2): 25 ug via INTRAVENOUS

## 2018-12-09 MED ORDER — CEFAZOLIN SODIUM-DEXTROSE 2-4 GM/100ML-% IV SOLN
2.0000 g | INTRAVENOUS | Status: AC
  Administered 2018-12-09: 2 g via INTRAVENOUS

## 2018-12-09 MED ORDER — FENTANYL CITRATE (PF) 100 MCG/2ML IJ SOLN
INTRAMUSCULAR | Status: AC
  Filled 2018-12-09: qty 2

## 2018-12-09 MED ORDER — FENTANYL CITRATE (PF) 250 MCG/5ML IJ SOLN
INTRAMUSCULAR | Status: AC
  Filled 2018-12-09: qty 5

## 2018-12-09 MED ORDER — SUCCINYLCHOLINE CHLORIDE 200 MG/10ML IV SOSY
PREFILLED_SYRINGE | INTRAVENOUS | Status: AC
  Filled 2018-12-09: qty 10

## 2018-12-09 MED ORDER — PHENYLEPHRINE 40 MCG/ML (10ML) SYRINGE FOR IV PUSH (FOR BLOOD PRESSURE SUPPORT)
PREFILLED_SYRINGE | INTRAVENOUS | Status: DC | PRN
  Administered 2018-12-09: 80 ug via INTRAVENOUS

## 2018-12-09 MED ORDER — LIDOCAINE 2% (20 MG/ML) 5 ML SYRINGE
INTRAMUSCULAR | Status: AC
  Filled 2018-12-09: qty 5

## 2018-12-09 MED ORDER — GLYCOPYRROLATE PF 0.2 MG/ML IJ SOSY
PREFILLED_SYRINGE | INTRAMUSCULAR | Status: DC | PRN
  Administered 2018-12-09: .1 mg via INTRAVENOUS

## 2018-12-09 MED ORDER — ONDANSETRON HCL 4 MG/2ML IJ SOLN: 4.0000 mg | Freq: Once | INTRAMUSCULAR | Status: DC | PRN

## 2018-12-09 MED ORDER — MEPERIDINE HCL 25 MG/ML IJ SOLN: 6.2500 mg | INTRAMUSCULAR | Status: DC | PRN

## 2018-12-09 MED ORDER — BUPIVACAINE-EPINEPHRINE 0.5% -1:200000 IJ SOLN
INTRAMUSCULAR | Status: DC | PRN
  Administered 2018-12-09 (×2): 1.8 mL

## 2018-12-09 MED ORDER — SODIUM CHLORIDE 0.9 % IV SOLN
INTRAVENOUS | Status: DC | PRN
  Administered 2018-12-09: 40 ug/min via INTRAVENOUS

## 2018-12-09 MED ORDER — ETOMIDATE 2 MG/ML IV SOLN
INTRAVENOUS | Status: DC | PRN
  Administered 2018-12-09: 14 mg via INTRAVENOUS

## 2018-12-09 MED ORDER — FENTANYL CITRATE (PF) 100 MCG/2ML IJ SOLN
25.0000 ug | INTRAMUSCULAR | Status: DC | PRN
  Administered 2018-12-09 (×2): 50 ug via INTRAVENOUS

## 2018-12-09 MED ORDER — LACTATED RINGERS IV SOLN
INTRAVENOUS | Status: DC | PRN
  Administered 2018-12-09: 06:00:00 via INTRAVENOUS

## 2018-12-09 MED ORDER — 0.9 % SODIUM CHLORIDE (POUR BTL) OPTIME
TOPICAL | Status: DC | PRN
  Administered 2018-12-09: 08:00:00 1000 mL

## 2018-12-09 MED ORDER — HEMOSTATIC AGENTS (NO CHARGE) OPTIME
TOPICAL | Status: DC | PRN
  Administered 2018-12-09: 1

## 2018-12-09 MED ORDER — ROCURONIUM BROMIDE 50 MG/5ML IV SOSY
PREFILLED_SYRINGE | INTRAVENOUS | Status: DC | PRN
  Administered 2018-12-09: 50 mg via INTRAVENOUS

## 2018-12-09 MED ORDER — DEXAMETHASONE SODIUM PHOSPHATE 10 MG/ML IJ SOLN
INTRAMUSCULAR | Status: DC | PRN
  Administered 2018-12-09: 10 mg via INTRAVENOUS

## 2018-12-09 SURGICAL SUPPLY — 43 items
ALCOHOL 70% 16 OZ (MISCELLANEOUS) ×3 IMPLANT
ATTRACTOMAT 16X20 MAGNETIC DRP (DRAPES) ×3 IMPLANT
BANDAGE HEMOSTAT MRDH 4X4 STRL (MISCELLANEOUS) IMPLANT
BLADE SURG 15 STRL LF DISP TIS (BLADE) ×2 IMPLANT
BLADE SURG 15 STRL SS (BLADE) ×9
BNDG HEMOSTAT MRDH 4X4 STRL (MISCELLANEOUS)
COVER SURGICAL LIGHT HANDLE (MISCELLANEOUS) ×3 IMPLANT
COVER WAND RF STERILE (DRAPES) ×3 IMPLANT
GAUZE 4X4 16PLY RFD (DISPOSABLE) ×3 IMPLANT
GAUZE PACKING FOLDED 2  STR (GAUZE/BANDAGES/DRESSINGS) ×2
GAUZE PACKING FOLDED 2 STR (GAUZE/BANDAGES/DRESSINGS) ×1 IMPLANT
GAUZE SPONGE 4X4 12PLY STRL LF (GAUZE/BANDAGES/DRESSINGS) ×2 IMPLANT
GLOVE BIO SURGEON STRL SZ 6.5 (GLOVE) ×2 IMPLANT
GLOVE BIO SURGEONS STRL SZ 6.5 (GLOVE) ×1
GLOVE SURG ORTHO 8.0 STRL STRW (GLOVE) ×3 IMPLANT
GOWN STRL REUS W/ TWL LRG LVL3 (GOWN DISPOSABLE) ×1 IMPLANT
GOWN STRL REUS W/TWL 2XL LVL3 (GOWN DISPOSABLE) ×3 IMPLANT
GOWN STRL REUS W/TWL LRG LVL3 (GOWN DISPOSABLE) ×3
HEMOSTAT SURGICEL 2X14 (HEMOSTASIS) IMPLANT
KIT BASIN OR (CUSTOM PROCEDURE TRAY) ×3 IMPLANT
KIT TURNOVER KIT B (KITS) ×3 IMPLANT
MANIFOLD NEPTUNE II (INSTRUMENTS) ×3 IMPLANT
NDL BLUNT 16X1.5 OR ONLY (NEEDLE) IMPLANT
NDL DENTAL 27 LONG (NEEDLE) IMPLANT
NEEDLE BLUNT 16X1.5 OR ONLY (NEEDLE) IMPLANT
NEEDLE DENTAL 27 LONG (NEEDLE) IMPLANT
NS IRRIG 1000ML POUR BTL (IV SOLUTION) ×3 IMPLANT
PACK EENT II TURBAN DRAPE (CUSTOM PROCEDURE TRAY) ×3 IMPLANT
PAD ARMBOARD 7.5X6 YLW CONV (MISCELLANEOUS) ×3 IMPLANT
SPONGE SURGIFOAM ABS GEL 100 (HEMOSTASIS) ×2 IMPLANT
SPONGE SURGIFOAM ABS GEL 12-7 (HEMOSTASIS) IMPLANT
SPONGE SURGIFOAM ABS GEL SZ50 (HEMOSTASIS) IMPLANT
SUCTION FRAZIER HANDLE 10FR (MISCELLANEOUS) ×2
SUCTION TUBE FRAZIER 10FR DISP (MISCELLANEOUS) ×1 IMPLANT
SUT CHROMIC 3 0 PS 2 (SUTURE) ×10 IMPLANT
SUT CHROMIC 4 0 P 3 18 (SUTURE) IMPLANT
SYR 50ML SLIP (SYRINGE) ×3 IMPLANT
TOWEL GREEN STERILE (TOWEL DISPOSABLE) ×3 IMPLANT
TUBE CONNECTING 12'X1/4 (SUCTIONS) ×1
TUBE CONNECTING 12X1/4 (SUCTIONS) ×2 IMPLANT
WATER STERILE IRR 1000ML POUR (IV SOLUTION) ×3 IMPLANT
WATER TABLETS ICX (MISCELLANEOUS) ×3 IMPLANT
YANKAUER SUCT BULB TIP NO VENT (SUCTIONS) ×3 IMPLANT

## 2018-12-09 NOTE — Discharge Instructions (Signed)

## 2018-12-09 NOTE — Op Note (Signed)
OPERATIVE REPORT  Patient:            [name] Date of Birth:  [birth year] MRN:                [id number]   DATE OF PROCEDURE:  12/09/2018  PREOPERATIVE DIAGNOSES: 1.  Severe aortic stenosis 2.  Pre-heart valve surgery dental protocol 3.  Chronic apical periodontitis 4.  Chronic periodontitis 5.  Loose teeth 6.  Bilateral mandibular lingual tori  POSTOPERATIVE DIAGNOSES: 1.  Severe aortic stenosis 2.  Pre-heart valve surgery dental protocol 3.  Chronic apical periodontitis 4.  Chronic periodontitis 5.  Loose teeth 6.  Bilateral mandibular lingual tori  OPERATIONS: 1. Multiple extraction of tooth numbers 6, 21 - 29, and 32 2. 3 Quadrants of alveoloplasty 3.  Bilateral mandibular lingual tori reductions  SURGEON: Lenn Cal, DDS  ASSISTANT: Molli Posey (dental assistant)  ANESTHESIA: General anesthesia via nasoendotracheal tube.  MEDICATIONS: 1. Ancef 2 g IV prior to invasive dental procedures. 2. Local anesthesia with a total utilization of 5 carpules each containing 34 mg of lidocaine with 0.017 mg of epinephrine as well as 2 carpules each containing 9 mg of bupivacaine with 0.009 mg of epinephrine.  SPECIMENS: There are 11 teeth that were discarded.  DRAINS: None  CULTURES: None  COMPLICATIONS: None  ESTIMATED BLOOD LOSS: 75 mLs.  INTRAVENOUS FLUIDS: 800 mLs of Lactated ringers solution.  INDICATIONS: The patient was recently diagnosed with severe aortic stenosis.  A medically necessary dental consultation was then requested to evaluate poor dentition.  The patient was examined and treatment planned for extraction of remaining teeth with alveoloplasty and pre-prosthetic surgery as needed in the operating room with general anesthesia.  This treatment plan was formulated to decrease the risks and complications associated with dental infection from affecting the patient's systemic health and the anticipated heart valve surgery.  OPERATIVE  FINDINGS: Patient was examined operating room number 7.  The teeth were identified for extraction. The patient was noted be affected by chronic periodontitis, chronic apical periodontitis, loose teeth, and bilateral mandibular lingual tori.   DESCRIPTION OF PROCEDURE: Patient was brought to the main operating room number 7. Patient was then placed in the supine position on the operating table. General anesthesia was then induced per the anesthesia team. The patient was then prepped and draped in the usual manner for dental medicine procedure. A timeout was performed. The patient was identified and procedures were verified. A throat pack was placed at this time. The oral cavity was then thoroughly examined with the findings noted above. The patient was then ready for dental medicine procedure as follows:  Local anesthesia was then administered sequentially with a total utilization of 5 carpules each containing 34 mg of lidocaine with 0.017 mg of epinephrine as well as 2 carpules  each containing 9 mg bupivacaine with 0.009 mg of epinephrine.  The Maxillary right quadrant was first approached. Anesthesia was then delivered utilizing infiltration with lidocaine with epinephrine. A #15 blade incision was then made from the distal of #4 and extended to the mesial #8.  A  surgical flap was then carefully reflected.  Tooth #6 was then subluxated and removed with a 150 forceps without complications.  Alveoloplasty was then performed utilizing a ronguers and bone file to help achieve primary closure.  The surgical site was irrigated with copious amounts sterile saline.  A piece of Surgifoam was placed in the extraction socket to aid hemostasis.  The soft tissues were approximated and  trimmed appropriately.  The surgical site was then closed from the distal of #4 and extended the mesial #8 utilizing 3-0 Chromic Gut suture in a continuous rapid suture technique x1.  At this point in time, the mandibular quadrants  were approached. The patient was given bilateral inferior alveolar nerve blocks and long buccal nerve blocks utilizing the bupivacaine with epinephrine. Further infiltration was then achieved utilizing the lidocaine with epinephrine. A 15 blade incision was then made from the distal of number 19 and extended to the distal of #32.  A surgical flap was then carefully reflected. The lower teeth were then subluxated with a series of straight elevators.  Tooth numbers 21, 22, 23, 24, 25, 26, 27, 28, 29 were then removed with a 151 forceps without complication.  Tooth #32 was then removed with a 23 forceps without complications.   At this point time the lingual flaps were further reflected to expose the bilateral mandibular lingual tori.  These mandibular tori were then reduced utilizing a surgical handpiece and bur and copious amounts of sterile water.  Alveoloplasty was then performed on the mandibular left and mandibular right quadrants to help achieve primary closure.  The surgical sites were then irrigated with copious amounts of sterile saline x4.  The tissues were approximated and trimmed appropriately.  A piece of Surgifoam was placed in the extraction sockets of tooth numbers 21, 22, 26, 27, 28, 29, and 32 appropriately.  The mandibular left surgical site was then closed from the distal of #19 extended the mesial #24 utilizing 3-0 Chromic Gut suture in a continuous rapid suture technique x1.  The mandibular right surgical site was then closed from the distal #32 to the mesial #25 utilizing 3-0 Chromic Gut suture in a continuous rapid suture technique x1.  2 individual interrupted sutures were then placed to further close surgical site.    At this point in time, the entire mouth was irrigated with copious amounts of sterile saline. The patient was examined for complications, seeing none, the dental medicine procedure was deemed to be complete. The throat pack was removed at this time. An oral airway was then  placed at the request of the anesthesia team. A series of 4 x 4 gauze moistened with Amicar 5% rinse were placed in the mouth to aid hemostasis. The patient was then handed over to the anesthesia team for final disposition. After an appropriate amount of time, the patient was extubated and taken to the postanesthsia care unit in good condition. All counts were correct for the dental medicine procedure.  The patient is to continue the Amicar 5% rinses postoperatively.  Patient is to rinse with 10 mL's every hour for 10 hours in a swish and spit manner.     Lenn Cal, DDS.

## 2018-12-09 NOTE — Anesthesia Postprocedure Evaluation (Signed)
Anesthesia Post Note  Patient: KANA KNITTEL  Procedure(s) Performed: EXTRACTION OF TOOTH #'S 6,21-29 AND 32 WITH ALVEOLOPLASTY AND BILATERAL MANDIBULAR TORI REDUCTIONS (N/A Mouth)     Patient location during evaluation: PACU Anesthesia Type: General Level of consciousness: awake and alert and oriented Pain management: pain level controlled Vital Signs Assessment: post-procedure vital signs reviewed and stable Respiratory status: spontaneous breathing, nonlabored ventilation and respiratory function stable Cardiovascular status: blood pressure returned to baseline and stable Postop Assessment: no apparent nausea or vomiting Anesthetic complications: no    Last Vitals:  Vitals:   12/09/18 0908 12/09/18 0923  BP: (!) 147/54 (!) 129/52  Pulse: 84 80  Resp: 14 15  Temp: (!) 36.2 C   SpO2: 98% 92%    Last Pain:  Vitals:   12/09/18 0655  PainSc: 0-No pain                 Amantha Sklar A.

## 2018-12-09 NOTE — Transfer of Care (Signed)
Immediate Anesthesia Transfer of Care Note  Patient: [name]  Procedure(s) Performed: EXTRACTION OF TOOTH #'S 6,21-29 AND 32 WITH ALVEOLOPLASTY AND BILATERAL MANDIBULAR TORI REDUCTIONS (N/A Mouth)  Patient Location: PACU  Anesthesia Type:General  Level of Consciousness: awake and patient cooperative  Airway & Oxygen Therapy: Patient Spontanous Breathing and Patient connected to face mask oxygen  Post-op Assessment: Report given to RN and Post -op Vital signs reviewed and stable  Post vital signs: Reviewed and stable  Last Vitals:  Vitals Value Taken Time  BP 147/54 12/09/18 0908  Temp    Pulse 84 12/09/18 0909  Resp 21 12/09/18 0909  SpO2 97 % 12/09/18 0909  Vitals shown include unvalidated device data.  Last Pain:  Vitals:   12/09/18 0655  PainSc: 0-No pain      Patients Stated Pain Goal: 3 ([id number])  Complications: No apparent anesthesia complications

## 2018-12-09 NOTE — H&P (Signed)
12/09/2018  Patient:            [name] Date of Birth:  [birth year] MRN:                [id number]   BP (!) 161/59   Pulse 70   Temp 98.2 F (36.8 C)   Resp 17   SpO2 97%    SHONDRA BINION is an 83 year old female with severe aortic stenosis with anticipated TAVR procedure.  Patient now presents for extraction of remaining teeth with alveoloplasty and pre-prosthetic surgery as needed in the operating room with general anesthesia.  The patient denies any acute medical or dental changes.  Patient denies having any COVID 19 symptoms.  Coronavirus screen was negative.  Please see note from Dr. Cyndia Bent dated 12/01/2018 to act as H&P for the dental operating room procedure.  [name], DDS      Patient ID: [name], female   DOB: 03/22/1936, 83 y.o.   MRN: [id number]  untitled image  [facility] CONS[facility] REPORT     Referring Provider is Deboraha Sprang, MD  Primary Cardiologist is Dorris Carnes, MD  PCP is Leonard Downing, MD          Chief Complaint    Patient presents with    .   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  She was admitted to [facility] on 10/20/2018 with near syncope with 2-1 AV block with intermittent complete heart block.  An echocardiogram at that time showed severe aortic stenosis with a peak velocity of 4.2 m/s, mean gradient of 42 mmHg, and calculated aortic valve area of 0.48 cm.  Left ventricular systolic function was normal with ejection fraction of greater than 65%.  She underwent successful pacemaker placement via the left subclavian vein by Dr. Caryl Comes.  She was  subsequently evaluated by Dr. Angelena Form for consideration of aortic valve replacement and underwent cardiac catheterization showing mild nonobstructive coronary disease.  The mean gradient was 39 mmHg with a peak to peak gradient of 47 mmHg and an aortic valve area of 0.48 cm.     The patient is here today with her niece.  She reports progressive exertional fatigue and tiredness.  She denies any significant shortness of breath.  She has had no dizziness since her pacemaker placement.  She denies any peripheral edema.  She has had no orthopnea or PND.  She denies any chest pressure pain.  She has been very active mowing her grass and taking care of her yard.  He does have a history of dental problems and had multiple loose teeth.  She saw Dr. Enrique Sack and is scheduled for extractions later this week.          Past Medical History:    Diagnosis   Date    .   Anxiety            Stable    .   ASCUS on Pap smear   11/15/09    .   Carotid artery disease ([place])  1-39% RICA stenosis and [id number] LICA stenosis on pre TAVR dopplers     .   Depression        .   Endometrial polyp   11/15/2009    .   Endometrioid adenocarcinoma        .   H/O varicella        .   Hemorrhoids        .   History of measles, mumps, or rubella        .   Hypercholesterolemia        .   Hypertension        .   Menopausal symptoms   06/13/10    .   Ovarian cyst   11/15/09    .   PMB (postmenopausal bleeding)   11/15/09    .   Presence of permanent cardiac pacemaker   10/20/2018    .   Vaginal bleeding                   Past Surgical History:    Procedure   Laterality   Date    .   BREAST SURGERY                Over twenty - five  years ago    .   CERVICAL BIOPSY            .   INSERT / REPLACE / REMOVE PACEMAKER       10/20/2018    .   LAPAROSCOPIC ASSISTED  VAGINAL HYSTERECTOMY       12/2009        BSO    .   PACEMAKER IMPLANT   N/A   10/20/2018        Procedure: PACEMAKER IMPLANT;  Surgeon: Deboraha Sprang, MD;  Location: [facility];  Service: Cardiovascular;  Laterality: N/A;    .   RIGHT/LEFT HEART CATH AND CORONARY ANGIOGRAPHY   N/A   11/24/2018        Procedure: RIGHT/LEFT HEART CATH AND CORONARY ANGIOGRAPHY;  Surgeon: Burnell Blanks, MD;  Location: [facility];  Service: Cardiovascular;  Laterality: N/A;               Family History    Problem   Relation   Age of Onset    .   Lung cancer   Brother        .   Heart disease   Brother        .   Heart failure   Mother        .   Parkinson's disease   Father              Social History             Socioeconomic History    .   Marital status:   Legally Separated            Spouse name:   Not on file    .   Number of children:   Not on file    .   Years of education:   Not on file    .   Highest education level:   Not on file    Occupational History    .   Occupation:   Retired-sales rep    Social Needs    .   Financial resource strain:   Not on file    .  Food insecurity            Worry:   Not on file            Inability:   Not on file    .   Transportation needs            Medical:   Not on file            Non-medical:   Not on file    Tobacco Use    .   Smoking status:   Current Every Day Smoker            Packs/day:   0.50            Years:   55.00            Pack years:   27.50            Types:   Cigarettes    .   Smokeless tobacco:   Never Used    Substance and Sexual Activity    .   Alcohol use:   No    .   Drug use:   Not Currently    .   Sexual activity:   Not on file    Lifestyle    .   Physical  activity            Days per week:   Not on file            Minutes per session:   Not on file    .   Stress:   Not on file    Relationships    .   Social Airline pilot on phone:   Not on file            Gets together:   Not on file            Attends religious service:   Not on file            Active member of club or organization:   Not on file            Attends meetings of clubs or organizations:   Not on file            Relationship status:   Not on file    .   Intimate partner violence            Fear of current or ex partner:   Not on file            Emotionally abused:   Not on file            Physically abused:   Not on file            Forced sexual activity:   Not on file    Other Topics   Concern    .   Not on file    Social History Narrative    .   Not on file                Current Outpatient Medications    Medication   Sig   Dispense   Refill    .   ALPRAZolam (XANAX) 0.25 MG tablet   Take 1 tablet (0.25 mg total) by mouth 3 (three) times daily as needed for anxiety.   10 tablet   0    .   amLODipine (NORVASC) 5  MG tablet   Take 5 mg by mouth every evening.             .   Cholecalciferol (VITAMIN D-3) 125 MCG (5000 UT) TABS   Take 5,000 Units by mouth daily at 2 PM.            .   citalopram (CELEXA) 40 MG tablet   Take 20 mg by mouth daily at 2 PM.             .   hydrOXYzine (VISTARIL) 50 MG capsule   Take 1 capsule (50 mg total) by mouth 3 (three) times daily as needed for anxiety.            Marland Kitchen   MAGNESIUM PO   Take 1 capsule by mouth at bedtime.            .   Melatonin 5 MG TABS   Take 5 mg by mouth at bedtime.            .   Omega-3 Fatty Acids (FISH OIL) 1200 MG CAPS   Take 1,200 mg by mouth daily at 2 PM.            .    simvastatin (ZOCOR) 20 MG tablet   Take 20 mg by mouth daily at 6 PM.             .   vitamin B-12 (CYANOCOBALAMIN) 500 MCG tablet   Take 500 mcg by mouth daily at 2 PM.                No current facility-administered medications for this visit.          No Known Allergies           Review of Systems:                 General:                      normal appetite, + decreased energy, no weight gain, no weight loss, no fever              Cardiac:                       no chest pain with exertion, no chest pain at rest, noSOB with  exertion, no resting SOB, no PND, no orthopnea, no palpitations, no arrhythmia, no atrial fibrillation, no LE edema, + dizzy spells, no syncope              Respiratory:                 no shortness of breath, no home oxygen, no productive cough, no dry cough, no bronchitis, no wheezing, no hemoptysis, no asthma, no pain with inspiration or cough, no sleep apnea, no CPAP at night              GI:                               no difficulty swallowing, no reflux, no frequent heartburn, no hiatal hernia, no abdominal pain, no constipation, no diarrhea, no hematochezia, no hematemesis, no melena              GU:  no dysuria,  + frequency, no urinary tract infection, no hematuria,  no kidney stones, no kidney disease              Vascular:                     no pain suggestive of claudication, no pain in feet, no leg cramps, no varicose veins, no DVT, no non-healing foot ulcer              Neuro:                         no stroke, no TIA's, no seizures, no headaches, no temporary blindness one eye,  no slurred speech, no peripheral neuropathy, no chronic pain, o instability of gait, no memory/cognitive dysfunction              Musculoskeletal:         no arthritis, no joint swelling, no myalgias, no difficulty walking, normal mobility               Skin:                            no rash, no itching, no  skin infections, no pressure sores or ulcerations              Psych:                         + anxiety, no depression, no nervousness, no unusual recent stress              Eyes:                           no blurry vision, no floaters, no recent vision changes, does not wear glasses or contacts              ENT:                            + hearing loss, + loose or painful teeth, no dentures, last saw dentist this week              Hematologic:               + easy bruising, no abnormal bleeding, no clotting disorder, no frequent epistaxis              Endocrine:                   no diabetes, does not check CBG's at home                                    Physical Exam:                 BP (!) 149/76 (BP Location: Left Arm)   Pulse 72   Temp (!) 97.5 F (36.4 C)   Resp 16   Ht 5\' 5"  (1.651 m)   Wt 117 lb (53.1 kg)   SpO2 94% Comment: RA  BMI 19.47 kg/m               General:  This woman,   well-appearing              HEENT:                       Unremarkable, NCAT, PERLA, EOMI              Neck:                           no JVD, no bruits, no adenopathy or thyromegaly              Chest:                          clear to auscultation, symmetrical breath sounds, no wheezes, no rhonchi               CV:                              RRR, grade III/VI crescendo/decrescendo murmur heard best at RSB,  no diastolic murmur              Abdomen:                    soft, non-tender, no masses               Extremities:                 warm, well-perfused, pulses not palpable in feet, no LE edema              Rectal/GU                   Deferred              Neuro:                         Grossly non-focal and symmetrical throughout              Skin:                            Clean and dry, no rashes, no breakdown        Diagnostic Tests:     ECHOCARDIOGRAM REPORT             Patient Name:   MIRELY GALKIN Date of Exam:  10/20/2018  Medical Rec #:  [id number]      Height:       66.0 in  Accession #:    [id number]     Weight:       116.0 lb  Date of Birth:  11-Aug-1935       BSA:          1.59 m  Patient Age:    8 years       BP:           158/46 mmHg  Patient Gender: F              HR:           63 bpm.  Exam Location:  Inpatient        Procedure: 2D Echo, Cardiac Doppler and Color Doppler     STAT ECHO     Indications:    R94.31 Abnormal EKG  History:        Patient has no prior history of Echocardiogram examinations.                  Aortic Valve Disease Risk Factors: Hypertension and                  Dyslipidemia.     Sonographer:    Tiffany Dance  Referring Phys: [id number] [place]         1. The left ventricle has hyperdynamic systolic function, with an ejection fraction of >65%. The cavity size was normal. Left ventricular diastolic Doppler parameters are consistent with impaired relaxation.   2. The right ventricle has normal systolic function. The cavity was normal. There is no increase in right ventricular wall thickness.   3. There is mild mitral annular calcification present.   4. The aortic valve is tricuspid. Severely thickening of the aortic valve. Severe calcifcation of the aortic valve. Aortic valve regurgitation was not assessed by color flow Doppler. Severe stenosis of the aortic valve.   5. Aortic valve peak velocity: 4.22m/s, mean gradient 69mmHg.   6. The aorta is normal in size and structure.     FINDINGS   Left Ventricle: The left ventricle has hyperdynamic systolic function, with an ejection fraction of >65%. The cavity size was normal. There is no increase in left ventricular wall thickness. Left ventricular diastolic Doppler parameters are consistent   with impaired relaxation.     Right Ventricle: The right ventricle has normal systolic function. The cavity was normal. There is no increase in right ventricular wall  thickness.     Left Atrium: Left atrial size was normal in size.     Right Atrium: Right atrial size was normal in size. Right atrial pressure is estimated at 3 mmHg.     Interatrial Septum: No atrial level shunt detected by color flow Doppler.     Pericardium: There is no evidence of pericardial effusion.     Mitral Valve: The mitral valve is normal in structure. There is mild mitral annular calcification present. Mitral valve regurgitation is mild by color flow Doppler.     Tricuspid Valve: The tricuspid valve is normal in structure. Tricuspid valve regurgitation is mild by color flow Doppler.     Aortic Valve: The aortic valve is tricuspid Severely thickening of the aortic valve. Severe calcifcation of the aortic valve. Aortic valve regurgitation was not assessed by color flow Doppler. There is Severe stenosis of the aortic valve, with a   calculated valve area of 0.48 cm. Aortic valve peak velocity: 4.54m/s, mean gradient 32mmHg.     Pulmonic Valve: The pulmonic valve was normal in structure. Pulmonic valve regurgitation is not visualized by color flow Doppler.     Aorta: The aorta is normal in size and structure.     Venous: The inferior vena cava is normal in size with greater than 50% respiratory variability.        +--------------+--------++  LEFT VENTRICLE          +--------------+--------++  PLAX 2D                 +--------------+--------++  LVIDd:        3.59 cm   +--------------+--------++  LVIDs:        2.53 cm   +--------------+--------++  LV PW:        0.93 cm   +--------------+--------++  LV IVS:  0.71 cm   +--------------+--------++  LVOT diam:    1.70 cm   +--------------+--------++  LV SV:        31 ml     +--------------+--------++  LV SV Index:  19.96     +--------------+--------++  LVOT Area:    2.27 cm  +--------------+--------++                           +--------------+--------++     +---------------+----------++  RIGHT VENTRICLE            +---------------+----------++  RV Basal diam: 2.09 cm     +---------------+----------++  RV S prime:    13.10 cm/s  +---------------+----------++  TAPSE (M-mode):2.3 cm      +---------------+----------++     +---------------+-------++-----------++  LEFT ATRIUM           Index        +---------------+-------++-----------++  LA diam:       2.80 cm1.76 cm/m   +---------------+-------++-----------++  LA Vol (A2C):  73.6 ml46.39 ml/m  +---------------+-------++-----------++  LA Vol (A4C):  23.8 ml15.00 ml/m  +---------------+-------++-----------++  LA Biplane Vol:45.6 ml28.74 ml/m  +---------------+-------++-----------++  +------------+--------++-----------++  RIGHT ATRIUM        Index        +------------+--------++-----------++  RA Area:    9.78 cm             +------------+--------++-----------++  RA Volume:  18.90 ml11.91 ml/m  +------------+--------++-----------++   +------------------+------------++  AORTIC VALVE                    +------------------+------------++  AV Area (Vmax):   0.56 cm      +------------------+------------++  AV Area (Vmean):  0.50 cm      +------------------+------------++  AV Area (VTI):    0.48 cm      +------------------+------------++  AV Vmax:          410.00 cm/s   +------------------+------------++  AV Vmean:         276.750 cm/s  +------------------+------------++  AV VTI:           1.045 m       +------------------+------------++  AV Peak Grad:     67.2 mmHg     +------------------+------------++  AV Mean Grad:     36.2 mmHg     +------------------+------------++  LVOT Vmax:        101.00 cm/s   +------------------+------------++  LVOT Vmean:       60.800 cm/s    +------------------+------------++  LVOT VTI:         0.219 m       +------------------+------------++  LVOT/AV VTI ratio:0.21          +------------------+------------++  AR PHT:           744 msec      +------------------+------------++     +-------------+-------++  AORTA                 +-------------+-------++  Ao Root diam:2.60 cm  +-------------+-------++     +--------------+----------++  +---------------+-----------++  MITRAL VALVE              TRICUSPID VALVE             +--------------+----------++  +---------------+-----------++  MV Area (PHT):2.95 cm    TR Peak grad:  27.4 mmHg    +--------------+----------++  +---------------+-----------++  MV PHT:       74.53 msec  TR Vmax:  267.00 cm/s  +--------------+----------++  +---------------+-----------++  MV Decel Time:257 msec    +--------------+----------++  +--------------+-------+  +--------------+-----------++ SHUNTS                 MV E velocity:96.10 cm/s  +--------------+-------+  +--------------+-----------++ Systemic VTI: 0.22 m   MV A velocity:137.00 cm/s +--------------+-------+  +--------------+-----------++ Systemic Diam:1.70 cm  MV E/A ratio: 0.70        +--------------+-------+  +--------------+-----------++        Candee Furbish MD  Electronically signed by Candee Furbish MD  Signature Date/Time: 10/20/2018/5:16:22 PM           Physicians       Panel Physicians    Referring Physician    Case Authorizing Physician     Burnell Blanks, MD (Primary)            Procedures      RIGHT/LEFT HEART CATH AND CORONARY ANGIOGRAPHY    Conclusion         .Prox RCA to Mid RCA lesion is 10% stenosed.   .Mid LAD lesion is 20% stenosed.      1. Mild non-obstructive CAD  2. Severe aortic stenosis (mean gradient 38.7 mmHg, peak to peak gradient 47 mmHg, AVA  0.48 cm2)     Recommendations: Will continue workup for TAVR.        Recommendations      Antiplatelet/Anticoag   Continue with workup for TAVR    Indications      Severe aortic stenosis [I35.0 (ICD-10-CM)]    Procedural Details      Technical Details   Indication: Severe AS, TAVR workup  Procedure: The risks, benefits, complications, treatment options, and expected outcomes were discussed with the patient. The patient and/or family concurred with the proposed plan, giving informed consent. The patient was brought to the cath lab after IV hydration was given. The patient was sedated with Versed and Fentanyl. The IV catheter in the right antecubital vein was changed for a 5 [place] sheath. Right heart cath performed with a balloon tipped catheter. The right wrist was prepped and draped in a sterile fashion. 1% lidocaine was used for local anesthesia. Using the modified Seldinger access technique, a 5 French sheath was placed in the right radial artery. 3 mg Verapamil was given through the sheath. 3000 units IV heparin was given. Standard diagnostic catheters were used to perform selective coronary angiography. I crossed the aortic valve with an AL-1 and a Jwire. A pigtail catheter was used to measure LV pressures. The sheath was removed from the right radial artery and a Terumo hemostasis band was applied at the arteriotomy site on the right wrist.    Estimated blood loss <50 mL.   During this procedure medications were administered to achieve and maintain moderate conscious sedation while the patient's heart rate, blood pressure, and oxygen saturation were continuously monitored and I was present face-to-face 100% of this time.    Medications  (Filter: Administrations occurring from 11/24/18 0822 to 11/24/18 0910)            Medication    Rate/Dose/Volume    Action         Date Time          Heparin (Porcine) in NaCl 1000-0.9 UT/500ML-% SOLN  (mL)   500 mL   Given   11/24/18 0828        Total dose as of 11/24/18 0910   500 mL   Given   0828  1,000 mL                            fentaNYL (SUBLIMAZE) injection (mcg)   25 mcg   Given   11/24/18 0829        Total dose as of 11/24/18 0910                            25 mcg                            midazolam (VERSED) injection (mg)   1 mg   Given   11/24/18 0829        Total dose as of 11/24/18 0910                            1 mg                            lidocaine (PF) (XYLOCAINE) 1 % injection (mL)   2 mL   Given   11/24/18 0837        Total dose as of 11/24/18 0910   2 mL   Given   0839        4 mL                            heparin injection (Units)   3,000 Units   Given   11/24/18 0849        Total dose as of 11/24/18 0910                            3,000 Units                            iohexol (OMNIPAQUE) 350 MG/ML injection (mL)   40 mL   Given   11/24/18 0859        Total dose as of 11/24/18 0910                            40 mL                            Radial Cocktail/Verapamil only (mL)   10 mL   Given   11/24/18 0841        Total dose as of 11/24/18 0910                            10 mL                            Sedation Time      Sedation Time Physician-1: 30 minutes 27 seconds    Complications      Complications documented before study signed (11/24/2018  9:21 AM)       RIGHT/LEFT HEART CATH AND CORONARY ANGIOGRAPHY      None   Documented by Burnell Blanks, MD 11/24/2018  9:06 AM    Date Found: 11/24/2018    Time Range:  Intraprocedure            Coronary Findings     Diagnostic  Dominance: Right   Left Anterior Descending    Vessel is large.    Mid LAD  lesion 20% stenosed    Mid LAD lesion is 20% stenosed.    Left Circumflex    Vessel is large.    Right Coronary Artery    Vessel is large.    Prox RCA to Mid RCA lesion 10% stenosed    Prox RCA to Mid RCA lesion is 10% stenosed.    Intervention     No interventions have been documented.  Coronary Diagrams     Diagnostic  Dominance: Right  untitled image  Intervention     Implants         No implant documentation for this case.    [employer] Images       Show images for CARDIAC CATHETERIZATION    Images on Long Term Storage       Show images for [employer] to Procedure Log      Procedure Log       Hemo Data            Most Recent Value     Fick Cardiac Output   3.33 L/min    Fick Cardiac Output Index   2.12 (L/min)/BSA    Aortic Mean Gradient   38.7 mmHg    Aortic Peak Gradient   47 mmHg    Aortic Valve Area   0.46    Aortic Value Area Index   0.29 cm2/BSA    RA A Wave   14 mmHg    RA V Wave   6 mmHg    RA Mean   7 mmHg    RV Systolic Pressure   32 mmHg    RV Diastolic Pressure   3 mmHg    RV EDP   7 mmHg    PA Systolic Pressure   38 mmHg    PA Diastolic Pressure   6 mmHg    PA Mean   25 mmHg    PW A Wave   13 mmHg    PW V Wave   10 mmHg    PW Mean   11 mmHg    AO Systolic Pressure   123XX123 mmHg    AO Diastolic Pressure   30 mmHg    AO Mean   73 mmHg    LV Systolic Pressure   123456 mmHg    LV Diastolic Pressure   7 mmHg    LV EDP   13 mmHg    AOp Systolic Pressure   [id number] mmHg    AOp Diastolic Pressure   48 mmHg    AOp Mean Pressure   72 mmHg    LVp Systolic Pressure   0000000 mmHg    LVp Diastolic Pressure   12 mmHg    LVp EDP Pressure   25 mmHg    QP/QS   1    TPVR Index   11.81 HRUI    TSVR Index   34.51 HRUI    PVR SVR Ratio   0.21    TPVR/TSVR Ratio    0.34            ADDENDUM REPORT: 11/26/2018 12:54     CLINICAL DATA:  83 year old female with severe aortic stenosis being  evaluated for a TAVR procedure.     EXAM:  Cardiac  TAVR CT     TECHNIQUE:  The patient was scanned on a [employer]. A 120 kV  retrospective scan was triggered in the descending thoracic aorta at  111 HU's. Gantry rotation speed was 250 msecs and collimation was .6  mm. No beta blockade or nitro were given. The 3D data set was  reconstructed in 5% intervals of the R-R cycle. Systolic and  diastolic phases were analyzed on a dedicated work station using  MPR, MIP and VRT modes. The patient received 80 cc of contrast.     FINDINGS:  Aortic Valve: Trileaflet aortic valve with severely thickened and  calcified leaflets and no calcifications extending into the LVOT.     Aorta: Normal size with moderate diffuse atherosclerotic plaque and  calcifications predominantly in the aortic arch and descending  aorta. No dissection.     Sinotubular Junction: 25 x 23 mm     Ascending Thoracic Aorta: 32 x 30 mm     Aortic Arch: 25 x 24 mm     Descending Thoracic Aorta: 21 x 20 mm     Sinus of Valsalva Measurements:     Non-coronary: 27 mm     Right -coronary: 25 mm     Left -coronary: 27 mm     Coronary Artery Height above Annulus:     Left Main: 14 mm     Right Coronary: 15 mm     Virtual Basal Annulus Measurements:     Maximum/Minimum Diameter: 23.3 x 19.7 mm     Mean Diameter: 21.2 mm     Perimeter: 67.3 mm     Area: 354 mm2     Optimum Fluoroscopic Angle for Delivery: LAO 4 CAU 7     IMPRESSION:  1. Trileaflet aortic valve with severely thickened and calcified  leaflets and no calcifications extending into the LVOT. Aortic valve  calcium score was 1897 consistent with severe aortic stenosis.  Annular measurements are suitable for delivery of a 23  mm  Edwards-SAPIEN 3 Ultra valve.     2. Sufficient coronary to annulus distance.     3. Optimum Fluoroscopic Angle for Delivery: LAO 4 CAU 7.     4. No thrombus in the left atrial appendage.     5. Pacemaker leads are seen in the right atrium and right ventricle.        Electronically Signed    By: Ena Dawley    On: 11/26/2018 12:54       Addended by Dorothy Spark, MD on 11/26/2018 12:56 PM       Study Result      EXAM:  OVER-READ INTERPRETATION  CT CHEST     The following report is an over-read performed by radiologist Dr.  Vinnie Langton of [facility], [place] on 11/26/2018. This  over-read does not include interpretation of cardiac or coronary  anatomy or pathology. The coronary calcium score/coronary CTA  interpretation by the cardiologist is attached.     COMPARISON:  None.     FINDINGS:  Extracardiac findings will be described separately under dictation  for contemporaneously obtained CTA chest, abdomen and pelvis.     IMPRESSION:  Please see separate dictation for contemporaneously obtained CTA  chest, abdomen and pelvis 11/26/2018 for full description of  relevant extracardiac findings.     Electronically Signed:  By: Vinnie Langton M.D.  On: 11/26/2018 09:22                CLINICAL DATA:  83 year old female with history of severe aortic  stenosis. Preprocedural study prior to potential transcatheter  aortic valve replacement (TAVR) procedure.     EXAM:  CT ANGIOGRAPHY CHEST, ABDOMEN AND PELVIS     TECHNIQUE:  Multidetector CT imaging through the chest, abdomen and pelvis was  performed using the standard protocol during bolus administration of  intravenous contrast. Multiplanar reconstructed images and MIPs were  obtained and reviewed to evaluate the vascular anatomy.     CONTRAST:  12mL OMNIPAQUE IOHEXOL 350 MG/ML SOLN     COMPARISON:  CT the abdomen and pelvis  02/19/2010.     FINDINGS:  CTA CHEST FINDINGS     Cardiovascular: Heart size is normal. There is no significant  pericardial fluid, thickening or pericardial calcification. There is  aortic atherosclerosis, as well as atherosclerosis of the great  vessels of the mediastinum and the coronary arteries, including  calcified atherosclerotic plaque in the left anterior descending,  left circumflex and right coronary arteries. Severe thickening  calcification of the aortic valve. Moderate calcifications of the  mitral annulus. Left-sided pacemaker device in place with lead tips  terminating in the right atrial appendage and right ventricular  apex.     Mediastinum/Lymph Nodes: No pathologically enlarged mediastinal or  hilar lymph nodes. Esophagus is unremarkable in appearance. No  axillary lymphadenopathy.     Lungs/Pleura: No suspicious pulmonary nodules or masses are noted.  No acute consolidative airspace disease. No pleural effusions.     Musculoskeletal/Soft Tissues: There are no aggressive appearing  lytic or blastic lesions noted in the visualized portions of the  skeleton.     CTA ABDOMEN AND PELVIS FINDINGS     Hepatobiliary: No suspicious cystic or solid hepatic lesions. No  intra or extrahepatic biliary ductal dilatation. Gallbladder is  normal in appearance.     Pancreas: No pancreatic mass. No pancreatic ductal dilatation. No  pancreatic or peripancreatic fluid collections or inflammatory  changes.     Spleen: Unremarkable.     Adrenals/Urinary Tract: Subcentimeter low-attenuation lesion in the  lateral aspect of the interpolar region of the right kidney, too  small to characterize, but statistically likely to represent a cyst.  Left kidney and bilateral adrenal glands are normal in appearance.  No hydroureteronephrosis. Urinary bladder is normal in appearance.     Stomach/Bowel: Normal appearance of the stomach. No  pathologic  dilatation of small bowel or colon. Numerous colonic diverticulae  are noted, without surrounding inflammatory changes to suggest an  acute diverticulitis at this time. Normal appendix.     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 No aneurysm or dissection noted in the abdominal or pelvic  vasculature. Large ulcerated plaque in the proximal abdominal aorta  adjacent to the origin of the superior mesenteric artery. No  lymphadenopathy noted in the abdomen or pelvis.     Reproductive: Status post hysterectomy. Ovaries are not confidently  identified may be surgically absent or atrophic.     Other: No significant volume of ascites.  No pneumoperitoneum.     Musculoskeletal: There are no aggressive appearing lytic or blastic  lesions noted in the visualized portions of the skeleton.     VASCULAR MEASUREMENTS PERTINENT TO TAVR:     AORTA:     Minimal Aortic Diameter-6.3 x 4.4 mm     Severity of Aortic Calcification-severe     RIGHT PELVIS:  Right Common Iliac Artery -     Minimal Diameter-occluded     Tortuosity-mild     Calcification-severe     Right External Iliac Artery -     Minimal Diameter-3.7 x 3.7 mm     Tortuosity - mild     Calcification-moderate     Right Common Femoral Artery -     Minimal Diameter-5.1 x 4.2 mm     Tortuosity - mild     Calcification-moderate     LEFT PELVIS:     Left Common Iliac Artery -     Minimal Diameter-5.9 x 3.3 mm     Tortuosity - mild     Calcification-moderate     Left External Iliac Artery -     Minimal Diameter-4.5 x 5.0 mm     Tortuosity - mild     Calcification-moderate     Left Common Femoral Artery -     Minimal Diameter-5.5 x 4.9 mm      Tortuosity - mild     Calcification-mild-to-moderate     Review of the MIP images confirms the above findings.     IMPRESSION:  1. 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  2. Severe thickening calcification of the aortic valve, compatible  with the reported clinical history of severe aortic stenosis.  3. Aortic atherosclerosis, in addition to 3 vessel coronary artery  disease.  4. Colonic diverticulosis without evidence of acute diverticulitis  at this time.  5. Additional incidental findings, as above.        Electronically Signed    By: Vinnie Langton M.D.    On: 11/26/2018 09:58     STS Risk Score:  Risk of Mortality: 3.323% Renal Failure: 1.807% Permanent Stroke: 1.142% Prolonged Ventilation: 8.809% DSW Infection: 0.089% Reoperation: 3.191% Morbidity or Mortality: 13.176% Short Length of Stay: 28.155% Long Length of Stay: 6.069%        Impression:     This 83 year old woman has stage D, severe, symptomatic aortic stenosis with New York Heart Association class II symptoms of exertional fatigue and tiredness consistent with chronic diastolic congestive heart failure.  I have personally reviewed her 2D echocardiogram, cardiac catheterization, and CTA studies.  Her 2D echocardiogram shows a trileaflet aortic valve with severe thickening and calcification and restricted mobility.  The mean gradient across aortic valve is 42 mmHg consistent with severe aortic stenosis.  Left ventricular systolic function was normal.  Cardiac catheterization shows mild nonobstructive coronary disease.  The mean gradient across aortic valve is 39 mmHg with a peak to peak gradient of 47 mmHg and an aortic valve area of 0.48 cm consistent with severe aortic stenosis.  I agree that aortic valve replacement is indicated in this patient.  Given her advanced age  I think transcatheter aortic valve replacement is probably the best option for her.  Her gated cardiac CTA shows anatomy suitable for transcatheter aortic valve replacement using a Sapien 3 valve.  Unfortunately her abdominal and pelvic CTA shows occlusion of the right common iliac artery and severe concentric calcified atherosclerosis of the left common iliac artery which precludes transfemoral insertion.  Her left subclavian artery appears to be of adequate size although she recently had a left subclavian vein permanent pacemaker placement so I think it is probably better to avoid the site.  Her left common carotid artery appears to be of adequate size but she has a 60  to 79% left internal carotid artery stenosis.  The right carotid artery also appears to be of adequate size.  The right subclavian artery appears to be of adequate size but making the turn into the innominate artery may be difficult.  Transaortic or transapical are other options available if necessary.     The patient was counseled at length regarding treatment alternatives for management of severe symptomatic aortic stenosis. The risks and benefits of surgical intervention has been discussed in detail. Long-term prognosis with medical therapy was discussed. Alternative approaches such as conventional surgical aortic valve replacement, transcatheter aortic valve replacement, and palliative medical therapy were compared and contrasted at length. This discussion was placed in the context of the patient's own specific clinical presentation and past medical history. All of her questions have been addressed.      Following the decision to proceed with transcatheter aortic valve replacement, a discussion was held regarding what types of management strategies would be attempted intraoperatively in the event of life-threatening complications, including whether or not the patient would be considered a candidate for the use of cardiopulmonary bypass  and/or conversion to open sternotomy for attempted surgical intervention.  She is 83 years old but is a low risk surgical patient and would be a candidate for emergent sternotomy to manage any intraoperative complications.     The patient has been advised of a variety of complications that might develop including but not limited to risks of death, stroke, paravalvular leak, aortic dissection or other major vascular complications, aortic annulus rupture, device embolization, cardiac rupture or perforation, mitral regurgitation, acute myocardial infarction, arrhythmia, heart block or bradycardia requiring permanent pacemaker placement, congestive heart failure, respiratory failure, renal failure, pneumonia, infection, other late complications related to structural valve deterioration or migration, or other complications that might ultimately cause a temporary or permanent loss of functional independence or other long term morbidity. The patient provides full informed consent for the procedure as described and all questions were answered.          Plan:     She is going to have her teeth pulled later this week and would then be scheduled for transcatheter aortic valve replacement via alternative access when she is recovered from her extractions.     I spent 60 minutes performing this consultation and > 50% of this time was spent face to face counseling and coordinating the care of this patient's severe symptomatic aortic stenosis.              Gaye Pollack, MD  12/01/2018                            Electronically signed by Gaye Pollack, MD at 12/01/2018  3:33 PM

## 2018-12-09 NOTE — Anesthesia Procedure Notes (Signed)
Arterial Line Insertion Start/End9/12/2018 7:00 AM, 12/09/2018 7:04 AM Performed by: Orlie Dakin, CRNA, CRNA  Patient location: Pre-op. Preanesthetic checklist: patient identified, IV checked, risks and benefits discussed, surgical consent, monitors and equipment checked, pre-op evaluation and timeout performed Lidocaine 1% used for infiltration and patient sedated Left, radial was placed Catheter size: 20 G Hand hygiene performed  and maximum sterile barriers used   Attempts: 1 Procedure performed without using ultrasound guided technique. Following insertion, dressing applied. Post procedure assessment: normal  Patient tolerated the procedure well with no immediate complications.

## 2018-12-09 NOTE — Anesthesia Procedure Notes (Addendum)
Procedure Name: Intubation Date/Time: 12/09/2018 7:34 AM Performed by: Orlie Dakin, CRNA Pre-anesthesia Checklist: Emergency Drugs available, Patient identified, Suction available and Patient being monitored Patient Re-evaluated:Patient Re-evaluated prior to induction Oxygen Delivery Method: Circle system utilized Preoxygenation: Pre-oxygenation with 100% oxygen Induction Type: IV induction Ventilation: Mask ventilation without difficulty Laryngoscope Size: Glidescope and 4 Grade View: Grade I Nasal Tubes: Left, Nasal prep performed and Nasal Rae Tube size: 7.0 mm Number of attempts: 1 Airway Equipment and Method: Video-laryngoscopy Placement Confirmation: ETT inserted through vocal cords under direct vision,  positive ETCO2 and breath sounds checked- equal and bilateral Tube secured with: Tape Dental Injury: Teeth and Oropharynx as per pre-operative assessment  Comments: Glidescope used to facilitate nasal intubation.  Lubricated nasal airways via left nare prior to NTT.

## 2018-12-09 NOTE — Progress Notes (Signed)
PRE-OPERATIVE NOTE:  12/09/2018 [name] [id number]  VITALS: BP (!) 161/59   Pulse 70   Temp 98.2 F (36.8 C)   Resp 17   SpO2 97%    COVID 19 SCREENING: The patient does not symptoms concerning for COVID-19 infection (Including fever, chills, cough, or new SHORTNESS OF BREATH).   Lab Results  Component Value Date   WBC 7.8 12/07/2018   HGB 16.4 (H) 12/07/2018   HCT 51.2 (H) 12/07/2018   MCV 93.8 12/07/2018   PLT 188 12/07/2018   BMET    Component Value Date/Time   NA 139 12/07/2018 1432   NA 140 11/19/2018 1301   K 4.0 12/07/2018 1432   CL 102 12/07/2018 1432   CO2 27 12/07/2018 1432   GLUCOSE 89 12/07/2018 1432   BUN 11 12/07/2018 1432   BUN 11 11/19/2018 1301   CREATININE 0.83 12/07/2018 1432   CALCIUM 9.7 12/07/2018 1432   GFRNONAA >60 12/07/2018 1432   GFRAA >60 12/07/2018 1432    No results found for: INR, PROTIME No results found for: PTT   [name] presents for extraction remaining teeth with alveoloplasty and pre-prosthetic surgery as needed in the operating room with general anesthesia.     SUBJECTIVE: The patient denies any acute medical or dental changes and agrees to proceed with treatment as planned.  EXAM: No sign of acute dental changes.  ASSESSMENT: Patient is affected by chronic apical periodontitis, dental caries, chronic periodontitis, loose teeth, and bilateral mandibular lingual tori.  PLAN: Patient agrees to proceed with treatment as planned in the operating room as previously discussed and accepts the risks, benefits, and complications of the proposed treatment. Patient is aware of the risk for bleeding, bruising, swelling, infection, pain, nerve damage, soft tissue damage, sinus involvement, root tip fracture, mandible fracture, and the risks of complications associated with the anesthesia. Patient also is aware of the potential for other complications up to and including death due to her overall cardiovascular and  respiratory compromise.    Lenn Cal, DDS

## 2018-12-09 NOTE — Addendum Note (Signed)
Addendum  created 12/09/18 1545 by Orlie Dakin, CRNA   Clinical Note Signed, Intraprocedure Blocks edited

## 2018-12-10 ENCOUNTER — Encounter (HOSPITAL_COMMUNITY): Payer: Self-pay | Admitting: Dentistry

## 2018-12-15 ENCOUNTER — Ambulatory Visit (HOSPITAL_COMMUNITY): Payer: Self-pay | Admitting: Dentistry

## 2018-12-15 ENCOUNTER — Other Ambulatory Visit: Payer: Self-pay

## 2018-12-15 ENCOUNTER — Encounter (HOSPITAL_COMMUNITY): Payer: Self-pay | Admitting: Dentistry

## 2018-12-15 DIAGNOSIS — K08109 Complete loss of teeth, unspecified cause, unspecified class: Secondary | ICD-10-CM

## 2018-12-15 DIAGNOSIS — K0820 Unspecified atrophy of edentulous alveolar ridge: Secondary | ICD-10-CM

## 2018-12-15 DIAGNOSIS — K08199 Complete loss of teeth due to other specified cause, unspecified class: Secondary | ICD-10-CM

## 2018-12-15 MED ORDER — CHLORHEXIDINE GLUCONATE 0.12 % MT SOLN: OROMUCOSAL | 99 refills | Status: AC

## 2018-12-15 NOTE — Patient Instructions (Signed)
PLAN: 1.  We will start chlorhexidine rinses for use after breakfast and at bedtime.  Prescription has been sent to [facility] with refills for 6 months. 2.  Continue salt water rinses in between the chlorhexidine rinses every 2 hours while awake 3.  Advance diet as tolerated but still keep a soft diet only. 4.  Patient is to follow-up with dental medicine in 4 to 5 weeks for reevaluation of healing. 5.  Patient is currently cleared for heart valve surgery as indicated.   Lenn Cal, DDS

## 2018-12-15 NOTE — Progress Notes (Signed)
POST OPERATIVE NOTE:  12/15/2018 GIANELLA BRANNIGAN [id number]  COVID 19 SCREENING: The patient does not symptoms concerning for COVID-19 infection (Including fever, chills, cough, or new SHORTNESS OF BREATH).    VITALS: BP (!) 164/59 (BP Location: Right Arm)   Pulse 75   Temp 98.1 F (36.7 C)   LABS:  Lab Results  Component Value Date   WBC 7.8 12/07/2018   HGB 16.4 (H) 12/07/2018   HCT 51.2 (H) 12/07/2018   MCV 93.8 12/07/2018   PLT 188 12/07/2018   BMET    Component Value Date/Time   NA 139 12/07/2018 1432   NA 140 11/19/2018 1301   K 4.0 12/07/2018 1432   CL 102 12/07/2018 1432   CO2 27 12/07/2018 1432   GLUCOSE 89 12/07/2018 1432   BUN 11 12/07/2018 1432   BUN 11 11/19/2018 1301   CREATININE 0.83 12/07/2018 1432   CALCIUM 9.7 12/07/2018 1432   GFRNONAA >60 12/07/2018 1432   GFRAA >60 12/07/2018 1432    No results found for: INR, PROTIME No results found for: PTT   [name] is status post extraction remaining teeth with alveoloplasty and bilateral mandibular lingual tori reductions in the operating room on 12/09/2018.  Patient now presents for evaluation of healing and suture removal as needed.  SUBJECTIVE: Patient with some discomfort from the extraction sites.  Patient Clearence Cheek that several stitches are still present.  Patient has been using salt water rinses for relief of soreness.  EXAM: There is no sign of infection, heme, or ooze.  Sutures are loosely intact.  Patient is healing in by generalized primary closure with some areas healing in by secondary intention.  There is still some soft tissue swelling associated with the mandibular anterior extraction sites.  PROCEDURE: The patient was given a chlorhexidine gluconate rinse for 30 seconds. Sutures were then removed without complication. Patient tolerated the procedure well.  ASSESSMENT: Post operative course is consistent with dental procedures performed in the operating room with general  anesthesia. Loss of teeth due to extraction Patient is now completely edentulous There is atrophy of the edentulous alveolar ridges.  PLAN: 1.  We will start chlorhexidine rinses for use after breakfast and at bedtime.  Prescription has been sent to [facility] with refills for 6 months. 2.  Continue salt water rinses in between the chlorhexidine rinses every 2 hours while awake 3.  Advance diet as tolerated but still keep a soft diet only. 4.  Patient is to follow-up with dental medicine in 4 to 5 weeks for reevaluation of healing. 5.  Patient is currently cleared for heart valve surgery as indicated.   Lenn Cal, DDS

## 2018-12-16 ENCOUNTER — Ambulatory Visit (HOSPITAL_COMMUNITY): Payer: Medicare HMO | Admitting: Dentistry

## 2018-12-16 ENCOUNTER — Other Ambulatory Visit (HOSPITAL_COMMUNITY): Payer: Self-pay

## 2018-12-17 ENCOUNTER — Ambulatory Visit (HOSPITAL_COMMUNITY)
Admission: RE | Admit: 2018-12-17 | Discharge: 2018-12-17 | Disposition: A | Discharge status: Home / Self Care | Payer: Medicare HMO | Source: Ambulatory Visit | Attending: Cardiovascular Disease | Admitting: Cardiovascular Disease

## 2018-12-17 ENCOUNTER — Other Ambulatory Visit (HOSPITAL_COMMUNITY)
Admission: RE | Admit: 2018-12-17 | Discharge: 2018-12-17 | Disposition: A | Discharge status: Home / Self Care | Payer: Medicare HMO | Source: Ambulatory Visit | Attending: Cardiovascular Disease | Admitting: Cardiovascular Disease

## 2018-12-17 ENCOUNTER — Encounter: Payer: Self-pay | Admitting: Physical Therapy

## 2018-12-17 ENCOUNTER — Ambulatory Visit: Payer: Medicare HMO | Admitting: Physical Therapy

## 2018-12-17 ENCOUNTER — Ambulatory Visit: Payer: Medicare HMO | Attending: Cardiovascular Disease | Admitting: Physical Therapy

## 2018-12-17 ENCOUNTER — Other Ambulatory Visit: Payer: Self-pay

## 2018-12-17 ENCOUNTER — Encounter (HOSPITAL_COMMUNITY): Payer: Self-pay

## 2018-12-17 ENCOUNTER — Encounter (HOSPITAL_COMMUNITY)
Admission: RE | Admit: 2018-12-17 | Discharge: 2018-12-17 | Disposition: A | Discharge status: Home / Self Care | Payer: Medicare HMO | Source: Ambulatory Visit | Attending: Cardiovascular Disease | Admitting: Cardiovascular Disease

## 2018-12-17 DIAGNOSIS — Z01818 Encounter for other preprocedural examination: Secondary | ICD-10-CM | POA: Insufficient documentation

## 2018-12-17 DIAGNOSIS — R293 Abnormal posture: Secondary | ICD-10-CM | POA: Diagnosis not present

## 2018-12-17 DIAGNOSIS — I350 Nonrheumatic aortic (valve) stenosis: Secondary | ICD-10-CM | POA: Diagnosis not present

## 2018-12-17 DIAGNOSIS — Z20828 Contact with and (suspected) exposure to other viral communicable diseases: Secondary | ICD-10-CM | POA: Insufficient documentation

## 2018-12-17 LAB — COMPREHENSIVE METABOLIC PANEL
ALT: <5 U/L (ref 0–44)
AST: 22 U/L (ref 15–41)
Albumin: 3.8 g/dL (ref 3.5–5.0)
Alkaline Phosphatase: 63 U/L (ref 38–126)
Anion gap: 13 (ref 5–15)
BUN: 11 mg/dL (ref 8–23)
CO2: 17 mmol/L — ABNORMAL LOW (ref 22–32)
Calcium: 8.9 mg/dL (ref 8.9–10.3)
Chloride: 105 mmol/L (ref 98–111)
Creatinine, Ser: 0.62 mg/dL (ref 0.44–1.00)
GFR calc Af Amer: >60 mL/min (ref >60)
GFR calc non Af Amer: >60 mL/min (ref >60)
Glucose, Bld: 78 mg/dL (ref 70–99)
Potassium: 4.4 mmol/L (ref 3.5–5.1)
Sodium: 135 mmol/L (ref 135–145)
Total Bilirubin: 0.2 mg/dL — ABNORMAL LOW (ref 0.3–1.2)
Total Protein: 5.6 g/dL — ABNORMAL LOW (ref 6.5–8.1)

## 2018-12-17 LAB — URINALYSIS, ROUTINE W REFLEX MICROSCOPIC
Bilirubin Urine: NEGATIVE
Glucose, UA: NEGATIVE mg/dL
Hgb urine dipstick: NEGATIVE
Ketones, ur: 5 mg/dL — AB
Leukocytes,Ua: NEGATIVE
Nitrite: NEGATIVE
Protein, ur: NEGATIVE mg/dL
Specific Gravity, Urine: 1.018 (ref 1.005–1.030)
pH: 5 (ref 5.0–8.0)

## 2018-12-17 LAB — APTT: aPTT: 34 seconds (ref 24–36)

## 2018-12-17 LAB — BLOOD GAS, ARTERIAL
Acid-Base Excess: 0.4 mmol/L (ref 0.0–2.0)
Bicarbonate: 24.3 mmol/L (ref 20.0–28.0)
Drawn by: 470591
FIO2: 21
O2 Saturation: 97.7 %
Patient temperature: 98.6
pCO2 arterial: 38 mmHg (ref 32.0–48.0)
pH, Arterial: 7.422 (ref 7.350–7.450)
pO2, Arterial: 94.9 mmHg (ref 83.0–108.0)

## 2018-12-17 LAB — HEMOGLOBIN A1C
Hgb A1c MFr Bld: 5 % (ref 4.8–5.6)
Mean Plasma Glucose: 96.8 mg/dL

## 2018-12-17 LAB — TYPE AND SCREEN
ABO/RH(D): A POS
Antibody Screen: NEGATIVE

## 2018-12-17 LAB — CBC
HCT: 45.9 % (ref 36.0–46.0)
Hemoglobin: 15.2 g/dL — ABNORMAL HIGH (ref 12.0–15.0)
MCH: 31.6 pg (ref 26.0–34.0)
MCHC: 33.1 g/dL (ref 30.0–36.0)
MCV: 95.4 fL (ref 80.0–100.0)
Platelets: 156 10*3/uL (ref 150–400)
RBC: 4.81 MIL/uL (ref 3.87–5.11)
RDW: 13.8 % (ref 11.5–15.5)
WBC: 6.7 10*3/uL (ref 4.0–10.5)
nRBC: 0 % (ref 0.0–0.2)

## 2018-12-17 LAB — PROTIME-INR
INR: 1.1 (ref 0.8–1.2)
Prothrombin Time: 14.4 seconds (ref 11.4–15.2)

## 2018-12-17 LAB — SURGICAL PCR SCREEN
MRSA, PCR: NEGATIVE
Staphylococcus aureus: NEGATIVE

## 2018-12-17 LAB — ABO/RH: ABO/RH(D): A POS

## 2018-12-17 LAB — BRAIN NATRIURETIC PEPTIDE: B Natriuretic Peptide: 142.7 pg/mL — ABNORMAL HIGH (ref 0.0–100.0)

## 2018-12-17 NOTE — Therapy (Signed)
[place], [place], [address] Phone: [phone]   Fax:  [phone]  Physical Therapy Evaluation  Patient Details  Name: [name] MRN: [id number] Date of Birth: May 17, 1935 Referring Provider (PT): Lauree Chandler MD   Encounter Date: 12/17/2018  PT End of Session - 12/17/18 1120    Visit Number  1    Number of Visits  1    Date for PT Re-Evaluation  12/17/18    PT Start Time  [id number]    PT Stop Time  1157    PT Time Calculation (min)  31 min    Activity Tolerance  Patient tolerated treatment well    Behavior During Therapy  [facility] for tasks assessed/performed       Past Medical History:  Diagnosis Date  . Anxiety    Stable  . ASCUS on Pap smear 11/15/09  . Carotid artery disease (HCC)    1-39% RICA stenosis and [id number] LICA stenosis on pre TAVR dopplers   . Depression   . Endometrial polyp 11/15/2009  . Endometrioid adenocarcinoma   . H/O varicella   . Hemorrhoids   . History of measles, mumps, or rubella   . Hypercholesterolemia   . Hypertension   . Menopausal symptoms 06/13/10  . Ovarian cyst 11/15/09  . PMB (postmenopausal bleeding) 11/15/09  . Presence of permanent cardiac pacemaker 10/20/2018  . Vaginal bleeding     Past Surgical History:  Procedure Laterality Date  . BREAST SURGERY     Over twenty - five  years ago  . CERVICAL BIOPSY    . INSERT / REPLACE / REMOVE PACEMAKER  10/20/2018  . LAPAROSCOPIC ASSISTED VAGINAL HYSTERECTOMY  12/2009   BSO  . MULTIPLE EXTRACTIONS WITH ALVEOLOPLASTY N/A 12/09/2018   Procedure: EXTRACTION OF TOOTH #'S 6,21-29 AND 32 WITH ALVEOLOPLASTY AND BILATERAL MANDIBULAR TORI REDUCTIONS;  Surgeon: Lenn Cal, DDS;  Location: [place];  Service: Oral Surgery;  Laterality: N/A;  . PACEMAKER IMPLANT N/A 10/20/2018   Procedure: PACEMAKER IMPLANT;  Surgeon: Deboraha Sprang, MD;  Location: [place] [facility];  Service: Cardiovascular;  Laterality: N/A;  .  RIGHT/LEFT HEART CATH AND CORONARY ANGIOGRAPHY N/A 11/24/2018   Procedure: RIGHT/LEFT HEART CATH AND CORONARY ANGIOGRAPHY;  Surgeon: Burnell Blanks, MD;  Location: [place] [facility];  Service: Cardiovascular;  Laterality: N/A;    There were no vitals filed for this visit.   Subjective Assessment - 12/17/18 1124    Subjective  pt is a 83y.o F with CC of Shortness of breath and dizziness, and occasional chest pain that only happens intermittently that with all these sypmtoms beginning around the beginning of september. pt currenlty lives alone and deines any pain.    Patient Stated Goals  to get heart better         [facility] PT Assessment - 12/17/18 1124      Assessment   Medical Diagnosis  Severe Aortic Stenosis    Referring Provider (PT)  Lauree Chandler MD    Hand Dominance  Right      Precautions   Precautions  None      Restrictions   Weight Bearing Restrictions  No      Balance Screen   Has the patient fallen in the past 6 months  No    Has the patient had a decrease in activity level because of a fear of falling?   No    Is the patient reluctant to leave their home  because of a fear of falling?   No      Home Environment   Living Environment  Private residence    Living Arrangements  Alone    Type of [place] Access  Stairs to enter    Entrance Stairs-Number of Steps  1    Entrance Stairs-Rails  None    Home Layout  One level    Home Equipment  None      Prior Function   Level of Independence  Independent      ROM / Strength   AROM / PROM / Strength  AROM;Strength      AROM   Overall AROM   Within functional limits for tasks performed      Strength   Overall Strength  Within functional limits for tasks performed    Right Hand Grip (lbs)  38    Left Hand Grip (lbs)  35      Ambulation/Gait   Ambulation/Gait  Yes    Assistive device  None    Gait Pattern  Step-through pattern;Poor foot clearance - left       [facility] Pre-Surgical  Assessment - 12/17/18 0001    5 Meter Walk Test- trial 1  5 sec    5 Meter Walk Test- trial 2  4 sec.     5 Meter Walk Test- trial 3  5 sec.    5 meter walk test average  4.67 sec    4 Stage Balance Test tolerated for:   2 sec.    4 Stage Balance Test Position  3    Sit To Stand Test- trial 1  15 sec.    ADL/IADL Independent with:  Bathing;Dressing;Meal prep    ADL/IADL Needs Assistance with:  Finances;Yard work    ADL/IADL Therapist, sports Index  Vulnerable    6 Minute Walk- Baseline  yes    BP (mmHg)  139/53    HR (bpm)  74    02 Sat (%RA)  96 %    Modified Borg Scale for Dyspnea  3- Moderate shortness of breath or breathing difficulty    Perceived Rate of Exertion (Borg)  13- Somewhat hard    6 Minute Walk Post Test  yes    BP (mmHg)  131/58    HR (bpm)  89    02 Sat (%RA)  92 %    Modified Borg Scale for Dyspnea  2- Mild shortness of breath    Perceived Rate of Exertion (Borg)  9- very light    Aerobic Endurance Distance Walked  1240    Endurance additional comments  pt is 3.58% limited compared to age related norm              Objective measurements completed on examination: See above findings.                           Plan - 12/17/18 1157    Clinical Impression Statement  see assessment in note    Stability/Clinical Decision Making  Stable/Uncomplicated    Clinical Decision Making  Low    PT Frequency  One time visit    PT Next Visit Plan  Pre-TAVR  evaluation    Consulted and Agree with Plan of Care  Patient       Clinical Impression Statement: Pt is a 83 yo F presenting to OP PT for evaluation prior to possible TAVR surgery due to severe aortic  stenosis. Pt reports onset of SOB, dizziness, intermittent chest pain approximately 1 months ago. Symptoms are limiting endurance. Pt presents with good ROM and strength, fair balance and is assessed as low at high fall risk 4 stage balance test, good walking speed and good aerobic endurance per 6 minute  walk test. Pt ambulated 1240 feet in without requiring a rest break . At the end of the test patient's HR was 89 bpm and O2 was 92% on room air. Pt reported 2/10 shortness of breath on modified scale for dyspnea.Pt ambulated a total of 1240 feet in 6 minute walk. Fatigue increased with 6 minute walk test. Based on the Short Physical Performance Battery, patient has a frailty rating of 8/12 with </= 5/12 considered frail.    Patient demonstrated the following deficits and impairments:     Visit Diagnosis: Abnormal posture     Problem List Patient Active Problem List   Diagnosis Date Noted  . Severe aortic valve stenosis   . Complete heart block ([place]) 10/20/2018  . Endometrial cancer ([place]) 06/12/2011   Starr Lake PT, DPT, LAT, ATC  12/17/18  12:02 PM      [facility] [address] [place], [place], [address] Phone: [phone]   Fax:  [phone]  Name: KALEESHA KALLAL MRN: [id number] Date of Birth: 1935-10-04

## 2018-12-17 NOTE — Progress Notes (Signed)
PCP - Dr. Leonard Downing  Cardiologist - Dr. Lauree Chandler, Dr. Dorris Carnes Electrophysiologist - Dr. Virl Axe  PPM/ICD - Pacemaker ([employer]) Device Orders -  Request for physician's orders form faxed to device clinic Rep Notified - E-mail sent   Chest x-ray - 12/17/2018 EKG - 12/17/2018 Stress Test - denies ECHO - 10/20/2018 Cardiac Cath - 11/24/2018  Sleep Study - denies CPAP - N/A  Blood Thinner Instructions: N/A Aspirin Instructions: N/A  COVID TEST- 12/17/2018  Anesthesia review: YES, Cardiac history, PM/ICD form faxed to device clinic  Patient denies shortness of breath, fever, cough and chest pain at PAT appointment  Patient verbalized understanding of instructions that were given to them at the PAT appointment. Patient was also instructed that they will need to review over the PAT instructions again at home before surgery.

## 2018-12-17 NOTE — Progress Notes (Signed)
[place], [place] [address] Phone: [phone] Fax: [phone]   Your procedure is scheduled on Tuesday, September 22nd.  Report to [facility] Main Entrance "A" at 5:30 A.M., and check in at the Admitting office.  Call this number if you have problems the morning of surgery:  [phone]  Call [phone] if you have any questions prior to your surgery date Monday-Friday 8am-4pm   Remember:  Do not eat or drink after midnight the night before your surgery    Take these medicines the morning of surgery with A SIP OF WATER: NONE  7 days prior to surgery STOP taking any Aspirin (unless otherwise instructed by your surgeon), Aleve, Naproxen, Ibuprofen, Motrin, Advil, Goody's, BC's, all herbal medications, fish oil, and all vitamins.  The Morning of Surgery  Do not wear jewelry, make-up or nail polish.  Do not wear lotions, powders, or perfumes/colognes, or deodorant  Do not shave 48 hours prior to surgery.  Men may shave face and neck.  Do not bring valuables to the hospital.  [facility] is not responsible for any belongings or valuables.  If you are a smoker, DO NOT Smoke 24 hours prior to surgery IF you wear a CPAP at night please bring your mask, tubing, and machine the morning of surgery   Remember that you must have someone to transport you home after your surgery, and remain with you for 24 hours if you are discharged the same day.  Contacts, glasses, hearing aids, dentures or bridgework may not be worn into surgery.   Leave your suitcase in the car.  After surgery it may be brought to your room.  For patients admitted to the hospital, discharge time will be determined by your treatment team.  Patients discharged the day of surgery will not be allowed to drive home.   Special instructions:   [facility]- Preparing For Surgery  Before surgery, you can play an important role.  Because skin is not sterile, your skin needs to be as free of germs as possible. You can reduce the number of germs on your skin by washing with CHG (chlorahexidine gluconate) Soap before surgery.  CHG is an antiseptic cleaner which kills germs and bonds with the skin to continue killing germs even after washing.    Oral Hygiene is also important to reduce your risk of infection.  Remember - BRUSH YOUR TEETH THE MORNING OF SURGERY WITH YOUR REGULAR TOOTHPASTE  Please do not use if you have an allergy to CHG or antibacterial soaps. If your skin becomes reddened/irritated stop using the CHG.  Do not shave (including legs and underarms) for at least 48 hours prior to first CHG shower. It is OK to shave your face.  Please follow these instructions carefully.   1. Shower the NIGHT BEFORE SURGERY and the MORNING OF SURGERY with CHG Soap.   2. If you chose to wash your hair, wash your hair first as usual with your normal shampoo.  3. After you shampoo, rinse your hair and body thoroughly to remove the shampoo.  4. Use CHG as you would any other liquid soap. You can apply CHG directly to the skin and wash gently with a scrungie or a clean washcloth.   5. Apply the CHG Soap to your body ONLY FROM THE NECK DOWN.  Do not use on open wounds or open sores. Avoid contact with your eyes, ears, mouth and genitals (  private parts). Wash Face and genitals (private parts)  with your normal soap.   6. Wash thoroughly, paying special attention to the area where your surgery will be performed.  7. Thoroughly rinse your body with warm water from the neck down.  8. DO NOT shower/wash with your normal soap after using and rinsing off the CHG Soap.  9. Pat yourself dry with a CLEAN TOWEL.  10. Wear CLEAN PAJAMAS to bed the night before surgery, wear comfortable clothes the morning of surgery  11. Place CLEAN SHEETS on your bed the night of your first shower and DO NOT SLEEP WITH PETS.  Day of Surgery: Do not  apply any deodorants/lotions. Please shower the morning of surgery with the CHG soap  Please wear clean clothes to the hospital/surgery center.   Remember to brush your teeth WITH YOUR REGULAR TOOTHPASTE.  Please read over the following fact sheets that you were given.

## 2018-12-18 LAB — NOVEL CORONAVIRUS, NAA (HOSP ORDER, SEND-OUT TO REF LAB; TAT 18-24 HRS): SARS-CoV-2, NAA: NOT DETECTED

## 2018-12-20 MED ORDER — NOREPINEPHRINE 4 MG/250ML-% IV SOLN
0.0000 ug/min | INTRAVENOUS | Status: DC
  Filled 2018-12-20: qty 250

## 2018-12-20 MED ORDER — NOREPINEPHRINE 4 MG/250ML-% IV SOLN
0.0000 ug/min | INTRAVENOUS | Status: AC
  Administered 2018-12-21: 08:00:00 2 ug/min via INTRAVENOUS
  Filled 2018-12-20: qty 250

## 2018-12-20 MED ORDER — MAGNESIUM SULFATE 50 % IJ SOLN
40.0000 meq | INTRAMUSCULAR | Status: DC
  Filled 2018-12-20: qty 9.85

## 2018-12-20 MED ORDER — VANCOMYCIN HCL IN DEXTROSE 1-5 GM/200ML-% IV SOLN
1000.0000 mg | INTRAVENOUS | Status: AC
  Administered 2018-12-21: 08:00:00 1000 mg via INTRAVENOUS
  Filled 2018-12-20: qty 200

## 2018-12-20 MED ORDER — POTASSIUM CHLORIDE 2 MEQ/ML IV SOLN
80.0000 meq | INTRAVENOUS | Status: DC
  Filled 2018-12-20: qty 40

## 2018-12-20 MED ORDER — SODIUM CHLORIDE 0.9 % IV SOLN
1.5000 g | INTRAVENOUS | Status: AC
  Administered 2018-12-21: 1.5 g via INTRAVENOUS
  Filled 2018-12-20: qty 1.5

## 2018-12-20 MED ORDER — DEXMEDETOMIDINE HCL IN NACL 400 MCG/100ML IV SOLN
0.1000 ug/kg/h | INTRAVENOUS | Status: DC
  Filled 2018-12-20: qty 100

## 2018-12-20 MED ORDER — SODIUM CHLORIDE 0.9 % IV SOLN
INTRAVENOUS | Status: DC
  Filled 2018-12-20: qty 30

## 2018-12-20 NOTE — H&P (Signed)
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 She was admitted to [facility] on 10/20/2018 with near syncope with 2-1 AV block with intermittent complete heart block. An echocardiogram at that time showed severe aortic stenosis with a peak velocity of 4.2 m/s, mean gradient of 42 mmHg, and calculated aortic valve area of 0.48 cm. Left ventricular systolic function was normal with ejection fraction of greater than 65%. She underwent successful pacemaker placement via the left subclavian vein by Dr. Caryl Comes. She was subsequently evaluated by Dr. Angelena Form for consideration of aortic valve replacement and underwent cardiac catheterization showing mild nonobstructive coronary disease. The mean gradient was 39 mmHg with a peak to peak gradient of 47 mmHg and an aortic valve area of 0.48 cm.   She reports progressive exertional fatigue and tiredness. She denies any significant shortness of breath. She has had no dizziness since her pacemaker placement. She denies any peripheral edema. She has had no orthopnea or PND. She denies any chest pressure pain. She has been very active mowing her grass and taking care of her yard. He does have a history of dental problems and had multiple loose teeth. She saw Dr. Enrique Sack and underwent extractions on 12/09/2018.      Past Medical History:  Diagnosis Date   Anxiety    Stable   ASCUS on Pap smear 11/15/09   Carotid artery disease (HCC)    1-39% RICA stenosis and  [id number] LICA stenosis on pre TAVR dopplers    Depression    Endometrial polyp 11/15/2009   Endometrioid adenocarcinoma    H/O varicella    Hemorrhoids    History of measles, mumps, or rubella    Hypercholesterolemia    Hypertension    Menopausal symptoms 06/13/10   Ovarian cyst 11/15/09   PMB (postmenopausal bleeding) 11/15/09   Presence of permanent cardiac pacemaker 10/20/2018   Vaginal bleeding         Past Surgical History:  Procedure Laterality Date   BREAST SURGERY     Over twenty - five years ago   [place] / REPLACE / REMOVE PACEMAKER  10/20/2018   LAPAROSCOPIC ASSISTED VAGINAL HYSTERECTOMY  12/2009   BSO   PACEMAKER IMPLANT N/A 10/20/2018   Procedure: PACEMAKER IMPLANT; Surgeon: Deboraha Sprang, MD; Location: [facility]; Service: Cardiovascular; Laterality: N/A;   RIGHT/LEFT HEART CATH AND CORONARY ANGIOGRAPHY N/A 11/24/2018   Procedure: RIGHT/LEFT HEART CATH AND CORONARY ANGIOGRAPHY; Surgeon: Burnell Blanks, MD; Location: [facility]; Service: Cardiovascular; Laterality: N/A;        Family History  Problem Relation Age of Onset   Lung cancer Brother    Heart disease Brother    Heart failure Mother    Parkinson's disease Father    Social History        Socioeconomic History   Marital status: Legally Separated  Spouse name: Not on file   Number of children: Not on file   Years of education: Not on file   Highest education level: Not on file  Occupational History   Occupation: Retired-sales rep  Social Needs   Financial resource strain: Not on file   Food insecurity    Worry: Not on file    Inability: Not on file   Transportation needs    Medical: Not on file    Non-medical: Not on file  Tobacco Use   Smoking status: Current Every Day Smoker    Packs/day: 0.50    Years: 55.00    Pack years: 27.50    Types: Cigarettes   Smokeless tobacco: Never Used  Substance and Sexual Activity    Alcohol use: No   Drug use: Not Currently   Sexual activity: Not on file  Lifestyle   Physical activity    Days per week: Not on file    Minutes per session: Not on file   Stress: Not on file  Relationships   Social connections    Talks on phone: Not on file    Gets together: Not on file    Attends religious service: Not on file    Active member of club or organization: Not on file    Attends meetings of clubs or organizations: Not on file    Relationship status: Not on file   Intimate partner violence    Fear of current or ex partner: Not on file    Emotionally abused: Not on file    Physically abused: Not on file    Forced sexual activity: Not on file  Other Topics Concern   Not on file  Social History Narrative   Not on file         Current Outpatient Medications  Medication Sig Dispense Refill   ALPRAZolam (XANAX) 0.25 MG tablet Take 1 tablet (0.25 mg total) by mouth 3 (three) times daily as needed for anxiety. 10 tablet 0   amLODipine (NORVASC) 5 MG tablet Take 5 mg by mouth every evening.      Cholecalciferol (VITAMIN D-3) 125 MCG (5000 UT) TABS Take 5,000 Units by mouth daily at 2 PM.     citalopram (CELEXA) 40 MG tablet Take 20 mg by mouth daily at 2 PM.      hydrOXYzine (VISTARIL) 50 MG capsule Take 1 capsule (50 mg total) by mouth 3 (three) times daily as needed for anxiety.     MAGNESIUM PO Take 1 capsule by mouth at bedtime.     Melatonin 5 MG TABS Take 5 mg by mouth at bedtime.     Omega-3 Fatty Acids (FISH OIL) 1200 MG CAPS Take 1,200 mg by mouth daily at 2 PM.     simvastatin (ZOCOR) 20 MG tablet Take 20 mg by mouth daily at 6 PM.      vitamin B-12 (CYANOCOBALAMIN) 500 MCG tablet Take 500 mcg by mouth daily at 2 PM.     No current facility-administered medications for this visit.    No Known Allergies   Review of Systems:   General: normal appetite, + decreased energy, no weight gain, no weight loss, no fever  Cardiac: no chest pain  with exertion, no chest pain at rest, noSOB with exertion, no resting SOB, no PND, no orthopnea, no palpitations, no arrhythmia, no atrial fibrillation, no LE edema, + dizzy spells, no syncope  Respiratory: no shortness of breath, no home oxygen, no productive cough, no dry cough,  no bronchitis, no wheezing, no hemoptysis, no asthma, no pain with inspiration or cough, no sleep apnea, no CPAP at night  GI: no difficulty swallowing, no reflux, no frequent heartburn, no hiatal hernia, no abdominal pain, no constipation, no diarrhea, no hematochezia, no hematemesis, no melena  GU: no dysuria, + frequency, no urinary tract infection, no hematuria, no kidney stones, no kidney disease  Vascular: no pain suggestive of claudication, no pain in feet, no leg cramps, no varicose veins, no DVT, no non-healing foot ulcer  Neuro: no stroke, no TIA's, no seizures, no headaches, no temporary blindness one eye, no slurred speech, no peripheral neuropathy, no chronic pain, o instability of gait, no memory/cognitive dysfunction  Musculoskeletal: no arthritis, no joint swelling, no myalgias, no difficulty walking, normal mobility  Skin: no rash, no itching, no skin infections, no pressure sores or ulcerations  Psych: + anxiety, no depression, no nervousness, no unusual recent stress  Eyes: no blurry vision, no floaters, no recent vision changes, does not wear glasses or contacts  ENT: + hearing loss, + loose or painful teeth, no dentures, last saw dentist this week  Hematologic: + easy bruising, no abnormal bleeding, no clotting disorder, no frequent epistaxis  Endocrine: no diabetes, does not check CBG's at home    Physical Exam:   BP (!) 149/76 (BP Location: Left Arm)   Pulse 72   Temp (!) 97.5 F (36.4 C)   Resp 16   Ht 5\' 5"  (1.651 m)   Wt 117 lb (53.1 kg)   SpO2 94% Comment: RA   BMI 19.47 kg/m  General: This woman, well-appearing  HEENT: Unremarkable, NCAT, PERLA, EOMI  Neck: no JVD, no bruits, no adenopathy  or thyromegaly  Chest: clear to auscultation, symmetrical breath sounds, no wheezes, no rhonchi  CV: RRR, grade III/VI crescendo/decrescendo murmur heard best at RSB, no diastolic murmur  Abdomen: soft, non-tender, no masses  Extremities: warm, well-perfused, pulses not palpable in feet, no LE edema  Rectal/GU Deferred  Neuro: Grossly non-focal and symmetrical throughout  Skin: Clean and dry, no rashes, no breakdown    Diagnostic Tests:   ECHOCARDIOGRAM REPORT  Patient Name: DAUREEN WASSELL Date of Exam: 10/20/2018  Medical Rec #: [id number] Height: 66.0 in  Accession #: [id number] Weight: 116.0 lb  Date of Birth: [birth year] BSA: 1.59 m  Patient Age: 79 years BP: 158/46 mmHg  Patient Gender: F HR: 63 bpm.  Exam Location: Inpatient  Procedure: 2D Echo, Cardiac Doppler and Color Doppler  STAT ECHO  Indications: R94.31 Abnormal EKG  History: Patient has no prior history of Echocardiogram examinations.  Aortic Valve Disease Risk Factors: Hypertension and  Dyslipidemia.  Sonographer: Tiffany Dance  Referring Phys: [id number] [place]  1. The left ventricle has hyperdynamic systolic function, with an ejection fraction of >65%. The cavity size was normal. Left ventricular diastolic Doppler parameters are consistent with impaired relaxation.  2. The right ventricle has normal systolic function. The cavity was normal. There is no increase in right ventricular wall thickness.  3. There is mild mitral annular calcification present.  4. The aortic valve is tricuspid. Severely thickening of the aortic valve. Severe calcifcation of the aortic valve. Aortic valve regurgitation was not assessed by color flow Doppler. Severe stenosis of the aortic valve.  5. Aortic valve peak velocity: 4.44m/s, mean gradient 37mmHg.  6. The aorta is normal in size and structure.  FINDINGS  Left Ventricle: The left ventricle has hyperdynamic systolic function, with an ejection fraction  of >65%. The  cavity size was normal. There is no increase in left ventricular wall thickness. Left ventricular diastolic Doppler parameters are consistent  with impaired relaxation.  Right Ventricle: The right ventricle has normal systolic function. The cavity was normal. There is no increase in right ventricular wall thickness.  Left Atrium: Left atrial size was normal in size.  Right Atrium: Right atrial size was normal in size. Right atrial pressure is estimated at 3 mmHg.  Interatrial Septum: No atrial level shunt detected by color flow Doppler.  Pericardium: There is no evidence of pericardial effusion.  Mitral Valve: The mitral valve is normal in structure. There is mild mitral annular calcification present. Mitral valve regurgitation is mild by color flow Doppler.  Tricuspid Valve: The tricuspid valve is normal in structure. Tricuspid valve regurgitation is mild by color flow Doppler.  Aortic Valve: The aortic valve is tricuspid Severely thickening of the aortic valve. Severe calcifcation of the aortic valve. Aortic valve regurgitation was not assessed by color flow Doppler. There is Severe stenosis of the aortic valve, with a  calculated valve area of 0.48 cm. Aortic valve peak velocity: 4.50m/s, mean gradient 31mmHg.  Pulmonic Valve: The pulmonic valve was normal in structure. Pulmonic valve regurgitation is not visualized by color flow Doppler.  Aorta: The aorta is normal in size and structure.  Venous: The inferior vena cava is normal in size with greater than 50% respiratory variability.  +--------------+--------++   LEFT VENTRICLE      +--------------+--------++   PLAX 2D       +--------------+--------++   LVIDd:  3.59 cm     +--------------+--------++   LVIDs:  2.53 cm     +--------------+--------++   LV PW:  0.93 cm     +--------------+--------++   LV IVS:  0.71 cm     +--------------+--------++   LVOT diam:  1.70 cm     +--------------+--------++   LV SV:  31 ml       +--------------+--------++   LV SV Index:  19.96     +--------------+--------++   LVOT Area:  2.27 cm    +--------------+--------++          +--------------+--------++  +---------------+----------++   RIGHT VENTRICLE      +---------------+----------++   RV Basal diam:  2.09 cm     +---------------+----------++   RV S prime:  13.10 cm/s    +---------------+----------++   TAPSE (M-mode): 2.3 cm     +---------------+----------++  +---------------+-------++-----------++   LEFT ATRIUM     Index     +---------------+-------++-----------++   LA diam:  2.80 cm  1.76 cm/m     +---------------+-------++-----------++   LA Vol (A2C):  73.6 ml  46.39 ml/m    +---------------+-------++-----------++   LA Vol (A4C):  23.8 ml  15.00 ml/m    +---------------+-------++-----------++   LA Biplane Vol: 45.6 ml  28.74 ml/m    +---------------+-------++-----------++  +------------+--------++-----------++   RIGHT ATRIUM    Index     +------------+--------++-----------++   RA Area:  9.78 cm       +------------+--------++-----------++   RA Volume:  18.90 ml  11.91 ml/m    +------------+--------++-----------++  +------------------+------------++   AORTIC VALVE       +------------------+------------++   AV Area (Vmax):  0.56 cm     +------------------+------------++   AV Area (Vmean):  0.50 cm     +------------------+------------++   AV Area (VTI):  0.48 cm     +------------------+------------++   AV Vmax:  410.00 cm/s     +------------------+------------++  AV Vmean:  276.750 cm/s    +------------------+------------++   AV VTI:  1.045 m     +------------------+------------++   AV Peak Grad:  67.2 mmHg     +------------------+------------++   AV Mean Grad:  36.2 mmHg     +------------------+------------++   LVOT Vmax:  101.00 cm/s     +------------------+------------++   LVOT Vmean:  60.800 cm/s     +------------------+------------++   LVOT VTI:  0.219 m      +------------------+------------++   LVOT/AV VTI ratio: 0.21     +------------------+------------++   AR PHT:  744 msec     +------------------+------------++  +-------------+-------++   AORTA       +-------------+-------++   Ao Root diam: 2.60 cm    +-------------+-------++  +--------------+----------++ +---------------+-----------++   MITRAL VALVE       TRICUSPID VALVE      +--------------+----------++ +---------------+-----------++   MV Area (PHT): 2.95 cm     TR Peak grad:  27.4 mmHg     +--------------+----------++ +---------------+-----------++   MV PHT:  74.53 msec    TR Vmax:  267.00 cm/s    +--------------+----------++ +---------------+-----------++   MV Decel Time: 257 msec     +--------------+----------++ +--------------+-------+  +--------------+-----------++  SHUNTS       MV E velocity: 96.10 cm/s    +--------------+-------+  +--------------+-----------++  Systemic VTI:  0.22 m     MV A velocity: 137.00 cm/s   +--------------+-------+  +--------------+-----------++  Systemic Diam: 1.70 cm    MV E/A ratio:  0.70    +--------------+-------+  +--------------+-----------++  Candee Furbish MD  Electronically signed by Candee Furbish MD  Signature Date/Time: 10/20/2018/5:16:22 PM     Panel Physicians Referring Physician Case Authorizing Physician  Burnell Blanks, MD (Primary)    Procedures  RIGHT/LEFT HEART CATH AND CORONARY ANGIOGRAPHY  Conclusion  Prox RCA to Mid RCA lesion is 10% stenosed.  Mid LAD lesion is 20% stenosed. 1. Mild non-obstructive CAD  2. Severe aortic stenosis (mean gradient 38.7 mmHg, peak to peak gradient 47 mmHg, AVA 0.48 cm2)  Recommendations: Will continue workup for TAVR.   Recommendations  Antiplatelet/Anticoag Continue with workup for TAVR  Indications  Severe aortic stenosis [I35.0 (ICD-10-CM)]  Procedural Details  Technical Details Indication: Severe AS, TAVR workup  Procedure: The risks, benefits, complications, treatment  options, and expected outcomes were discussed with the patient. The patient and/or family concurred with the proposed plan, giving informed consent. The patient was brought to the cath lab after IV hydration was given. The patient was sedated with Versed and Fentanyl. The IV catheter in the right antecubital vein was changed for a 5 [place] sheath. Right heart cath performed with a balloon tipped catheter. The right wrist was prepped and draped in a sterile fashion. 1% lidocaine was used for local anesthesia. Using the modified Seldinger access technique, a 5 French sheath was placed in the right radial artery. 3 mg Verapamil was given through the sheath. 3000 units IV heparin was given. Standard diagnostic catheters were used to perform selective coronary angiography. I crossed the aortic valve with an AL-1 and a Jwire. A pigtail catheter was used to measure LV pressures. The sheath was removed from the right radial artery and a Terumo hemostasis band was applied at the arteriotomy site on the right wrist.    Estimated blood loss <50 mL.   During this procedure medications were administered to achieve and maintain moderate conscious sedation while the patient's heart rate, blood pressure, and oxygen saturation were  continuously monitored and I was present face-to-face 100% of this time.  Medications  (Filter: Administrations occurring from 11/24/18 0822 to 11/24/18 0910)          Medication Rate/Dose/Volume Action  Date Time   Heparin (Porcine) in NaCl 1000-0.9 UT/500ML-% SOLN (mL) 500 mL Given 11/24/18 0828   Total dose as of 11/24/18 0910 500 mL Given 0828   1,000 mL        fentaNYL (SUBLIMAZE) injection (mcg) 25 mcg Given 11/24/18 0829   Total dose as of 11/24/18 0910        25 mcg        midazolam (VERSED) injection (mg) 1 mg Given 11/24/18 0829   Total dose as of 11/24/18 0910        1 mg        lidocaine (PF) (XYLOCAINE) 1 % injection (mL) 2 mL Given 11/24/18 0837   Total dose as of  11/24/18 0910 2 mL Given 0839   4 mL        heparin injection (Units) 3,000 Units Given 11/24/18 0849   Total dose as of 11/24/18 0910        3,000 Units        iohexol (OMNIPAQUE) 350 MG/ML injection (mL) 40 mL Given 11/24/18 0859   Total dose as of 11/24/18 0910        40 mL        Radial Cocktail/Verapamil only (mL) 10 mL Given 11/24/18 0841   Total dose as of 11/24/18 0910        10 mL        Sedation Time  Sedation Time Physician-1: 30 minutes 27 seconds  Complications  Complications documented before study signed (11/24/2018 9:21 AM)   RIGHT/LEFT HEART CATH AND CORONARY ANGIOGRAPHY   None Documented by Burnell Blanks, MD 11/24/2018 9:06 AM  Date Found: 11/24/2018  Time Range: Intraprocedure    Coronary Findings  Diagnostic  Dominance: Right  Left Anterior Descending  Vessel is large.  Mid LAD lesion 20% stenosed  Mid LAD lesion is 20% stenosed.  Left Circumflex  Vessel is large.  Right Coronary Artery  Vessel is large.  Prox RCA to Mid RCA lesion 10% stenosed  Prox RCA to Mid RCA lesion is 10% stenosed.  Intervention  No interventions have been documented.  Coronary Diagrams  Diagnostic  Dominance: Right   Intervention  Implants     No implant documentation for this case.  Syngo Images  Link to Procedure Log   Show images for CARDIAC CATHETERIZATION Procedure Log  Images on Long Term Storage    Show images for [place], [place]   Hemo Data   Most Recent Value  Fick Cardiac Output 3.33 L/min  Fick Cardiac Output Index 2.12 (L/min)/BSA  Aortic Mean Gradient 38.7 mmHg  Aortic Peak Gradient 47 mmHg  Aortic Valve Area 0.46  Aortic Value Area Index 0.29 cm2/BSA  RA A Wave 14 mmHg  RA V Wave 6 mmHg  RA Mean 7 mmHg  RV Systolic Pressure 32 mmHg  RV Diastolic Pressure 3 mmHg  RV EDP 7 mmHg  PA Systolic Pressure 38 mmHg  PA Diastolic Pressure 6 mmHg  PA Mean 25 mmHg  PW A Wave 13 mmHg  PW V Wave 10 mmHg  PW Mean 11 mmHg  AO Systolic Pressure 123XX123 mmHg   AO Diastolic Pressure 30 mmHg  AO Mean 73 mmHg  LV Systolic Pressure 123456 mmHg  LV Diastolic Pressure 7 mmHg  LV EDP 13 mmHg  AOp Systolic Pressure A999333 mmHg  AOp Diastolic Pressure 48 mmHg  AOp Mean Pressure 72 mmHg  LVp Systolic Pressure 0000000 mmHg  LVp Diastolic Pressure 12 mmHg  LVp EDP Pressure 25 mmHg  QP/QS 1  TPVR Index 11.81 HRUI  TSVR Index 34.51 HRUI  PVR SVR Ratio 0.21  TPVR/TSVR Ratio 0.34     ADDENDUM REPORT: 11/26/2018 12:54  CLINICAL DATA: 83 year old female with severe aortic stenosis being  evaluated for a TAVR procedure.  EXAM:  Cardiac TAVR CT  TECHNIQUE:  The patient was scanned on a [employer]. A 120 kV  retrospective scan was triggered in the descending thoracic aorta at  111 HU's. Gantry rotation speed was 250 msecs and collimation was .6  mm. No beta blockade or nitro were given. The 3D data set was  reconstructed in 5% intervals of the R-R cycle. Systolic and  diastolic phases were analyzed on a dedicated work station using  MPR, MIP and VRT modes. The patient received 80 cc of contrast.  FINDINGS:  Aortic Valve: Trileaflet aortic valve with severely thickened and  calcified leaflets and no calcifications extending into the LVOT.  Aorta: Normal size with moderate diffuse atherosclerotic plaque and  calcifications predominantly in the aortic arch and descending  aorta. No dissection.  Sinotubular Junction: 25 x 23 mm  Ascending Thoracic Aorta: 32 x 30 mm  Aortic Arch: 25 x 24 mm  Descending Thoracic Aorta: 21 x 20 mm  Sinus of Valsalva Measurements:  Non-coronary: 27 mm  Right -coronary: 25 mm  Left -coronary: 27 mm  Coronary Artery Height above Annulus:  Left Main: 14 mm  Right Coronary: 15 mm  Virtual Basal Annulus Measurements:  Maximum/Minimum Diameter: 23.3 x 19.7 mm  Mean Diameter: 21.2 mm  Perimeter: 67.3 mm  Area: 354 mm2  Optimum Fluoroscopic Angle for Delivery: LAO 4 CAU 7  IMPRESSION:  1. Trileaflet aortic valve with  severely thickened and calcified  leaflets and no calcifications extending into the LVOT. Aortic valve  calcium score was 1897 consistent with severe aortic stenosis.  Annular measurements are suitable for delivery of a 23 mm  Edwards-SAPIEN 3 Ultra valve.  2. Sufficient coronary to annulus distance.  3. Optimum Fluoroscopic Angle for Delivery: LAO 4 CAU 7.  4. No thrombus in the left atrial appendage.  5. Pacemaker leads are seen in the right atrium and right ventricle.  Electronically Signed  By: Ena Dawley  On: 11/26/2018 12:54   Addended by Dorothy Spark, MD on 11/26/2018 12:56 PM    Study Result   EXAM:  OVER-READ INTERPRETATION CT CHEST  The following report is an over-read performed by radiologist Dr.  Vinnie Langton of [facility], [place] on 11/26/2018. This  over-read does not include interpretation of cardiac or coronary  anatomy or pathology. The coronary calcium score/coronary CTA  interpretation by the cardiologist is attached.  COMPARISON: None.  FINDINGS:  Extracardiac findings will be described separately under dictation  for contemporaneously obtained CTA chest, abdomen and pelvis.  IMPRESSION:  Please see separate dictation for contemporaneously obtained CTA  chest, abdomen and pelvis 11/26/2018 for full description of  relevant extracardiac findings.  Electronically Signed:  By: Vinnie Langton M.D.  On: 11/26/2018 09:22   CLINICAL DATA: 83 year old female with history of severe aortic  stenosis. Preprocedural study prior to potential transcatheter  aortic valve replacement (TAVR) procedure.  EXAM:  CT ANGIOGRAPHY CHEST, ABDOMEN AND PELVIS  TECHNIQUE:  Multidetector CT imaging through the  chest, abdomen and pelvis was  performed using the standard protocol during bolus administration of  intravenous contrast. Multiplanar reconstructed images and MIPs were  obtained and reviewed to evaluate the vascular anatomy.  CONTRAST: 78mL OMNIPAQUE  IOHEXOL 350 MG/ML SOLN  COMPARISON: CT the abdomen and pelvis 02/19/2010.  FINDINGS:  CTA CHEST FINDINGS  Cardiovascular: Heart size is normal. There is no significant  pericardial fluid, thickening or pericardial calcification. There is  aortic atherosclerosis, as well as atherosclerosis of the great  vessels of the mediastinum and the coronary arteries, including  calcified atherosclerotic plaque in the left anterior descending,  left circumflex and right coronary arteries. Severe thickening  calcification of the aortic valve. Moderate calcifications of the  mitral annulus. Left-sided pacemaker device in place with lead tips  terminating in the right atrial appendage and right ventricular  apex.  Mediastinum/Lymph Nodes: No pathologically enlarged mediastinal or  hilar lymph nodes. Esophagus is unremarkable in appearance. No  axillary lymphadenopathy.  Lungs/Pleura: No suspicious pulmonary nodules or masses are noted.  No acute consolidative airspace disease. No pleural effusions.  Musculoskeletal/Soft Tissues: There are no aggressive appearing  lytic or blastic lesions noted in the visualized portions of the  skeleton.  CTA ABDOMEN AND PELVIS FINDINGS  Hepatobiliary: No suspicious cystic or solid hepatic lesions. No  intra or extrahepatic biliary ductal dilatation. Gallbladder is  normal in appearance.  Pancreas: No pancreatic mass. No pancreatic ductal dilatation. No  pancreatic or peripancreatic fluid collections or inflammatory  changes.  Spleen: Unremarkable.  Adrenals/Urinary Tract: Subcentimeter low-attenuation lesion in the  lateral aspect of the interpolar region of the right kidney, too  small to characterize, but statistically likely to represent a cyst.  Left kidney and bilateral adrenal glands are normal in appearance.  No hydroureteronephrosis. Urinary bladder is normal in appearance.  Stomach/Bowel: Normal appearance of the stomach. No pathologic  dilatation of  small bowel or colon. Numerous colonic diverticulae  are noted, without surrounding inflammatory changes to suggest an  acute diverticulitis at this time. Normal appendix.  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 No aneurysm or dissection noted in the abdominal or pelvic  vasculature. Large ulcerated plaque in the proximal abdominal aorta  adjacent to the origin of the superior mesenteric artery. No  lymphadenopathy noted in the abdomen or pelvis.  Reproductive: Status post hysterectomy. Ovaries are not confidently  identified may be surgically absent or atrophic.  Other: No significant volume of ascites. No pneumoperitoneum.  Musculoskeletal: There are no aggressive appearing lytic or blastic  lesions noted in the visualized portions of the skeleton.  VASCULAR MEASUREMENTS PERTINENT TO TAVR:  AORTA:  Minimal Aortic Diameter-6.3 x 4.4 mm  Severity of Aortic Calcification-severe  RIGHT PELVIS:  Right Common Iliac Artery -  Minimal Diameter-occluded  Tortuosity-mild  Calcification-severe  Right External Iliac Artery -  Minimal Diameter-3.7 x 3.7 mm  Tortuosity - mild  Calcification-moderate  Right Common Femoral Artery -  Minimal Diameter-5.1 x 4.2 mm  Tortuosity - mild  Calcification-moderate  LEFT PELVIS:  Left Common Iliac Artery -  Minimal Diameter-5.9 x 3.3 mm  Tortuosity - mild  Calcification-moderate  Left External Iliac Artery -  Minimal Diameter-4.5 x 5.0 mm  Tortuosity - mild  Calcification-moderate  Left Common Femoral Artery -  Minimal Diameter-5.5 x 4.9 mm  Tortuosity - mild  Calcification-mild-to-moderate  Review of the MIP images confirms the  above findings.  IMPRESSION:  1. 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  2. Severe thickening calcification of the aortic valve, compatible  with the reported clinical history of severe aortic stenosis.  3. Aortic atherosclerosis, in addition to 3 vessel coronary artery  disease.  4. Colonic diverticulosis without evidence of acute diverticulitis  at this time.  5. Additional incidental findings, as above.  Electronically Signed  By: Vinnie Langton M.D.  On: 11/26/2018 09:58    STS Risk Score:   Risk of Mortality: 3.323% Renal Failure: 1.807% Permanent Stroke: 1.142% Prolonged Ventilation: 8.809% DSW Infection: 0.089% Reoperation: 3.191% Morbidity or Mortality: 13.176% Short Length of Stay: 28.155% Long Length of Stay: 6.069%    Impression:   This 83 year old woman has stage D, severe, symptomatic aortic stenosis with New York Heart Association class II symptoms of exertional fatigue and tiredness consistent with chronic diastolic congestive heart failure. I have personally reviewed her 2D echocardiogram, cardiac catheterization, and CTA studies. Her 2D echocardiogram shows a trileaflet aortic valve with severe thickening and calcification and restricted mobility. The mean gradient across aortic valve is 42 mmHg consistent with severe aortic stenosis. Left ventricular systolic function was normal. Cardiac catheterization shows mild nonobstructive coronary disease. The mean gradient across aortic valve is 39 mmHg with a peak to peak gradient of 47 mmHg and an aortic valve area of 0.48 cm consistent with severe aortic stenosis. I agree that aortic valve replacement is indicated in this patient. Given her advanced age I think transcatheter aortic valve replacement is probably the best option for her. Her gated cardiac CTA shows anatomy suitable for transcatheter aortic valve replacement using a Sapien 3 valve. Unfortunately her abdominal and pelvic CTA shows occlusion of the right common iliac artery and severe concentric  calcified atherosclerosis of the left common iliac artery which precludes transfemoral insertion. Her left subclavian artery appears to be of adequate size although she recently had a left subclavian vein permanent pacemaker placement so I think it is probably better to avoid the site. Her left common carotid artery appears to be of adequate size but she has a 60 to 79% left internal carotid artery stenosis. The right carotid artery also appears to be of adequate size. The right subclavian artery appears to be of adequate size but making the turn into the innominate artery may be difficult. Transaortic or transapical are other options available if necessary. I think we will try a right subclavian insertion first.  The patient was counseled at length regarding treatment alternatives for management of severe symptomatic aortic stenosis. The risks and benefits of surgical intervention has been discussed in detail. Long-term prognosis with medical therapy was discussed. Alternative approaches such as conventional surgical aortic valve replacement, transcatheter aortic valve replacement, and palliative medical therapy were compared and contrasted at length. This discussion was placed in the context of the patient's own specific clinical presentation and past medical history. All of her questions have been addressed.  Following the decision to proceed with transcatheter aortic valve replacement, a discussion was held regarding what types of management strategies would be attempted intraoperatively in the event of life-threatening complications, including whether or not the patient would be considered a candidate for the use of cardiopulmonary bypass and/or conversion to open sternotomy for attempted surgical intervention. She is 82 years old but is a low risk surgical patient and would be a candidate for emergent sternotomy to  manage any intraoperative complications.   The patient has been advised of a variety of  complications that might develop including but not limited to risks of death, stroke, paravalvular leak, aortic dissection or other major vascular complications, aortic annulus rupture, device embolization, cardiac rupture or perforation, mitral regurgitation, acute myocardial infarction, arrhythmia, heart block or bradycardia requiring permanent pacemaker placement, congestive heart failure, respiratory failure, renal failure, pneumonia, infection, other late complications related to structural valve deterioration or migration, or other complications that might ultimately cause a temporary or permanent loss of functional independence or other long term morbidity. The patient provides full informed consent for the procedure as described and all questions were answered.   Plan:    Right subclavian transcatheter aortic valve replacement.   Gaye Pollack, MD

## 2018-12-21 ENCOUNTER — Inpatient Hospital Stay (HOSPITAL_COMMUNITY): Payer: Medicare HMO | Admitting: Physician Assistant

## 2018-12-21 ENCOUNTER — Inpatient Hospital Stay (HOSPITAL_COMMUNITY): Payer: Medicare HMO

## 2018-12-21 ENCOUNTER — Encounter (HOSPITAL_COMMUNITY): Payer: Self-pay | Admitting: *Deleted

## 2018-12-21 ENCOUNTER — Other Ambulatory Visit: Payer: Self-pay | Admitting: Physician Assistant

## 2018-12-21 ENCOUNTER — Other Ambulatory Visit: Payer: Self-pay

## 2018-12-21 ENCOUNTER — Encounter (HOSPITAL_COMMUNITY)
Admission: RE | Disposition: A | Discharge status: Home / Self Care | Payer: Medicare HMO | Source: Home / Self Care | Attending: Surgery

## 2018-12-21 ENCOUNTER — Inpatient Hospital Stay (HOSPITAL_COMMUNITY): Payer: Medicare HMO | Admitting: Certified Registered Nurse Anesthetist

## 2018-12-21 ENCOUNTER — Inpatient Hospital Stay (HOSPITAL_COMMUNITY)
Admission: RE | Admit: 2018-12-21 | Discharge: 2018-12-22 | DRG: 267 | Disposition: A | Discharge status: Home / Self Care | Payer: Medicare HMO | Attending: Surgery | Admitting: Surgery

## 2018-12-21 DIAGNOSIS — Z952 Presence of prosthetic heart valve: Secondary | ICD-10-CM

## 2018-12-21 DIAGNOSIS — Z8249 Family history of ischemic heart disease and other diseases of the circulatory system: Secondary | ICD-10-CM | POA: Diagnosis not present

## 2018-12-21 DIAGNOSIS — Z8543 Personal history of malignant neoplasm of ovary: Secondary | ICD-10-CM | POA: Diagnosis not present

## 2018-12-21 DIAGNOSIS — Z20828 Contact with and (suspected) exposure to other viral communicable diseases: Secondary | ICD-10-CM | POA: Diagnosis present

## 2018-12-21 DIAGNOSIS — F419 Anxiety disorder, unspecified: Secondary | ICD-10-CM | POA: Diagnosis present

## 2018-12-21 DIAGNOSIS — Z90710 Acquired absence of both cervix and uterus: Secondary | ICD-10-CM | POA: Diagnosis not present

## 2018-12-21 DIAGNOSIS — Z8619 Personal history of other infectious and parasitic diseases: Secondary | ICD-10-CM

## 2018-12-21 DIAGNOSIS — Z79899 Other long term (current) drug therapy: Secondary | ICD-10-CM

## 2018-12-21 DIAGNOSIS — J449 Chronic obstructive pulmonary disease, unspecified: Secondary | ICD-10-CM | POA: Diagnosis not present

## 2018-12-21 DIAGNOSIS — Z820 Family history of epilepsy and other diseases of the nervous system: Secondary | ICD-10-CM

## 2018-12-21 DIAGNOSIS — I442 Atrioventricular block, complete: Secondary | ICD-10-CM | POA: Diagnosis not present

## 2018-12-21 DIAGNOSIS — Z87891 Personal history of nicotine dependence: Secondary | ICD-10-CM | POA: Diagnosis not present

## 2018-12-21 DIAGNOSIS — Z950 Presence of cardiac pacemaker: Secondary | ICD-10-CM

## 2018-12-21 DIAGNOSIS — E785 Hyperlipidemia, unspecified: Secondary | ICD-10-CM | POA: Diagnosis present

## 2018-12-21 DIAGNOSIS — Z8542 Personal history of malignant neoplasm of other parts of uterus: Secondary | ICD-10-CM | POA: Diagnosis not present

## 2018-12-21 DIAGNOSIS — I10 Essential (primary) hypertension: Secondary | ICD-10-CM | POA: Diagnosis present

## 2018-12-21 DIAGNOSIS — Z801 Family history of malignant neoplasm of trachea, bronchus and lung: Secondary | ICD-10-CM

## 2018-12-21 DIAGNOSIS — I350 Nonrheumatic aortic (valve) stenosis: Secondary | ICD-10-CM

## 2018-12-21 DIAGNOSIS — Z006 Encounter for examination for normal comparison and control in clinical research program: Secondary | ICD-10-CM

## 2018-12-21 DIAGNOSIS — F329 Major depressive disorder, single episode, unspecified: Secondary | ICD-10-CM | POA: Diagnosis present

## 2018-12-21 DIAGNOSIS — K5730 Diverticulosis of large intestine without perforation or abscess without bleeding: Secondary | ICD-10-CM | POA: Diagnosis not present

## 2018-12-21 DIAGNOSIS — I2510 Atherosclerotic heart disease of native coronary artery without angina pectoris: Secondary | ICD-10-CM | POA: Diagnosis present

## 2018-12-21 DIAGNOSIS — Z954 Presence of other heart-valve replacement: Secondary | ICD-10-CM | POA: Diagnosis not present

## 2018-12-21 DIAGNOSIS — R69 Illness, unspecified: Secondary | ICD-10-CM | POA: Diagnosis not present

## 2018-12-21 HISTORY — PX: TEE WITHOUT CARDIOVERSION: SHX5443

## 2018-12-21 LAB — POCT I-STAT, CHEM 8
BUN: 13 mg/dL (ref 8–23)
BUN: 13 mg/dL (ref 8–23)
BUN: 13 mg/dL (ref 8–23)
BUN: 13 mg/dL (ref 8–23)
Calcium, Ion: 1.25 mmol/L (ref 1.15–1.40)
Calcium, Ion: 1.23 mmol/L (ref 1.15–1.40)
Calcium, Ion: 1.26 mmol/L (ref 1.15–1.40)
Calcium, Ion: 1.25 mmol/L (ref 1.15–1.40)
Chloride: 103 mmol/L (ref 98–111)
Chloride: 102 mmol/L (ref 98–111)
Chloride: 101 mmol/L (ref 98–111)
Chloride: 104 mmol/L (ref 98–111)
Creatinine, Ser: 0.4 mg/dL — ABNORMAL LOW (ref 0.44–1.00)
Creatinine, Ser: 0.5 mg/dL (ref 0.44–1.00)
Creatinine, Ser: 0.6 mg/dL (ref 0.44–1.00)
Creatinine, Ser: 0.6 mg/dL (ref 0.44–1.00)
Glucose, Bld: 87 mg/dL (ref 70–99)
Glucose, Bld: 152 mg/dL — ABNORMAL HIGH (ref 70–99)
Glucose, Bld: 145 mg/dL — ABNORMAL HIGH (ref 70–99)
Glucose, Bld: 106 mg/dL — ABNORMAL HIGH (ref 70–99)
HCT: 39 % (ref 36.0–46.0)
HCT: 39 % (ref 36.0–46.0)
HCT: 36 % (ref 36.0–46.0)
HCT: 36 % (ref 36.0–46.0)
Hemoglobin: 13.3 g/dL (ref 12.0–15.0)
Hemoglobin: 13.3 g/dL (ref 12.0–15.0)
Hemoglobin: 12.2 g/dL (ref 12.0–15.0)
Hemoglobin: 12.2 g/dL (ref 12.0–15.0)
Potassium: 3.8 mmol/L (ref 3.5–5.1)
Potassium: 3.6 mmol/L (ref 3.5–5.1)
Potassium: 3.7 mmol/L (ref 3.5–5.1)
Potassium: 4 mmol/L (ref 3.5–5.1)
Sodium: 140 mmol/L (ref 135–145)
Sodium: 138 mmol/L (ref 135–145)
Sodium: 138 mmol/L (ref 135–145)
Sodium: 139 mmol/L (ref 135–145)
TCO2: 27 mmol/L (ref 22–32)
TCO2: 25 mmol/L (ref 22–32)
TCO2: 27 mmol/L (ref 22–32)
TCO2: 24 mmol/L (ref 22–32)

## 2018-12-21 SURGERY — IMPLANTATION, AORTIC VALVE, TRANSCATHETER, SUBCLAVIAN ARTERY APPROACH
Anesthesia: General | Site: Chest

## 2018-12-21 MED ORDER — ALPRAZOLAM 0.25 MG PO TABS
0.2500 mg | ORAL_TABLET | Freq: Three times a day (TID) | ORAL | Status: DC | PRN
  Administered 2018-12-22: 0.25 mg via ORAL
  Filled 2018-12-21: qty 1

## 2018-12-21 MED ORDER — SODIUM CHLORIDE 0.9 % IV SOLN: INTRAVENOUS | Status: AC

## 2018-12-21 MED ORDER — ESMOLOL HCL 100 MG/10ML IV SOLN
INTRAVENOUS | Status: DC | PRN
  Administered 2018-12-21: 20 mg via INTRAVENOUS

## 2018-12-21 MED ORDER — MIDAZOLAM HCL 2 MG/2ML IJ SOLN
INTRAMUSCULAR | Status: AC
  Filled 2018-12-21: qty 2

## 2018-12-21 MED ORDER — LIDOCAINE HCL (PF) 1 % IJ SOLN
INTRAMUSCULAR | Status: AC
  Filled 2018-12-21: qty 30

## 2018-12-21 MED ORDER — ACETAMINOPHEN 325 MG PO TABS
650.0000 mg | ORAL_TABLET | Freq: Four times a day (QID) | ORAL | Status: DC | PRN
  Administered 2018-12-22: 650 mg via ORAL
  Filled 2018-12-21: qty 2

## 2018-12-21 MED ORDER — SODIUM CHLORIDE 0.9 % IV SOLN
INTRAVENOUS | Status: AC
  Filled 2018-12-21 (×3): qty 1.2

## 2018-12-21 MED ORDER — CHLORHEXIDINE GLUCONATE 4 % EX LIQD: 60.0000 mL | Freq: Once | CUTANEOUS | Status: DC

## 2018-12-21 MED ORDER — LIDOCAINE HCL (CARDIAC) PF 100 MG/5ML IV SOSY: PREFILLED_SYRINGE | INTRAVENOUS | Status: DC | PRN

## 2018-12-21 MED ORDER — METOPROLOL TARTRATE 5 MG/5ML IV SOLN: 2.5000 mg | INTRAVENOUS | Status: DC | PRN

## 2018-12-21 MED ORDER — MORPHINE SULFATE (PF) 2 MG/ML IV SOLN: 1.0000 mg | INTRAVENOUS | Status: DC | PRN

## 2018-12-21 MED ORDER — MELATONIN 5 MG PO TABS: 5.0000 mg | ORAL_TABLET | Freq: Every day | ORAL | Status: DC

## 2018-12-21 MED ORDER — MELATONIN 3 MG PO TABS
3.0000 mg | ORAL_TABLET | Freq: Every day | ORAL | Status: DC
  Filled 2018-12-21: qty 1

## 2018-12-21 MED ORDER — OXYCODONE HCL 5 MG PO TABS
5.0000 mg | ORAL_TABLET | ORAL | Status: DC | PRN
  Administered 2018-12-21: 5 mg via ORAL
  Filled 2018-12-21: qty 1

## 2018-12-21 MED ORDER — IODIXANOL 320 MG/ML IV SOLN
INTRAVENOUS | Status: DC | PRN
  Administered 2018-12-21: 42 mL via INTRA_ARTERIAL

## 2018-12-21 MED ORDER — ASPIRIN 81 MG PO CHEW
81.0000 mg | CHEWABLE_TABLET | Freq: Every day | ORAL | Status: DC
  Administered 2018-12-22: 81 mg via ORAL
  Filled 2018-12-21: qty 1

## 2018-12-21 MED ORDER — NITROGLYCERIN IN D5W 200-5 MCG/ML-% IV SOLN: 0.0000 ug/min | INTRAVENOUS | Status: DC

## 2018-12-21 MED ORDER — SODIUM CHLORIDE 0.9 % IV SOLN
1.5000 g | Freq: Two times a day (BID) | INTRAVENOUS | Status: DC
  Administered 2018-12-21 – 2018-12-22 (×3): 1.5 g via INTRAVENOUS
  Filled 2018-12-21 (×4): qty 1.5

## 2018-12-21 MED ORDER — SODIUM CHLORIDE 0.9% FLUSH
3.0000 mL | Freq: Two times a day (BID) | INTRAVENOUS | Status: DC
  Administered 2018-12-22: 3 mL via INTRAVENOUS

## 2018-12-21 MED ORDER — ROCURONIUM BROMIDE 100 MG/10ML IV SOLN: INTRAVENOUS | Status: DC | PRN

## 2018-12-21 MED ORDER — CLOPIDOGREL BISULFATE 75 MG PO TABS
75.0000 mg | ORAL_TABLET | Freq: Every day | ORAL | Status: DC
  Administered 2018-12-22: 75 mg via ORAL
  Filled 2018-12-21: qty 1

## 2018-12-21 MED ORDER — 0.9 % SODIUM CHLORIDE (POUR BTL) OPTIME
TOPICAL | Status: DC | PRN
  Administered 2018-12-21: 1000 mL

## 2018-12-21 MED ORDER — FENTANYL CITRATE (PF) 250 MCG/5ML IJ SOLN
INTRAMUSCULAR | Status: AC
  Filled 2018-12-21: qty 5

## 2018-12-21 MED ORDER — VANCOMYCIN HCL IN DEXTROSE 1-5 GM/200ML-% IV SOLN
1000.0000 mg | Freq: Once | INTRAVENOUS | Status: AC
  Administered 2018-12-21: 21:00:00 1000 mg via INTRAVENOUS
  Filled 2018-12-21: qty 200

## 2018-12-21 MED ORDER — POLYVINYL ALCOHOL 1.4 % OP SOLN
1.0000 [drp] | Freq: Every evening | OPHTHALMIC | Status: DC | PRN
  Filled 2018-12-21: qty 15

## 2018-12-21 MED ORDER — LIDOCAINE 2% (20 MG/ML) 5 ML SYRINGE
INTRAMUSCULAR | Status: DC | PRN
  Administered 2018-12-21: 40 mg via INTRAVENOUS

## 2018-12-21 MED ORDER — PHENYLEPHRINE 40 MCG/ML (10ML) SYRINGE FOR IV PUSH (FOR BLOOD PRESSURE SUPPORT)
PREFILLED_SYRINGE | INTRAVENOUS | Status: DC | PRN
  Administered 2018-12-21 (×3): 80 ug via INTRAVENOUS
  Administered 2018-12-21: 40 ug via INTRAVENOUS

## 2018-12-21 MED ORDER — CARBOXYMETHYLCELLULOSE SODIUM 0.5 % OP SOLN: 1.0000 [drp] | Freq: Every day | OPHTHALMIC | Status: DC

## 2018-12-21 MED ORDER — VITAMIN B-12 1000 MCG PO TABS
500.0000 ug | ORAL_TABLET | Freq: Every day | ORAL | Status: DC
  Administered 2018-12-21 – 2018-12-22 (×2): 500 ug via ORAL
  Filled 2018-12-21 (×2): qty 1

## 2018-12-21 MED ORDER — FENTANYL CITRATE (PF) 250 MCG/5ML IJ SOLN
INTRAMUSCULAR | Status: DC | PRN
  Administered 2018-12-21 (×2): 50 ug via INTRAVENOUS
  Administered 2018-12-21: 100 ug via INTRAVENOUS

## 2018-12-21 MED ORDER — ROCURONIUM BROMIDE 10 MG/ML (PF) SYRINGE
PREFILLED_SYRINGE | INTRAVENOUS | Status: DC | PRN
  Administered 2018-12-21: 50 mg via INTRAVENOUS
  Administered 2018-12-21 (×2): 20 mg via INTRAVENOUS

## 2018-12-21 MED ORDER — ONDANSETRON HCL 4 MG/2ML IJ SOLN: 4.0000 mg | Freq: Four times a day (QID) | INTRAMUSCULAR | Status: DC | PRN

## 2018-12-21 MED ORDER — PROPOFOL 10 MG/ML IV BOLUS
INTRAVENOUS | Status: AC
  Filled 2018-12-21: qty 20

## 2018-12-21 MED ORDER — CHLORHEXIDINE GLUCONATE 0.12 % MT SOLN
15.0000 mL | Freq: Once | OROMUCOSAL | Status: AC
  Administered 2018-12-21: 06:00:00 15 mL via OROMUCOSAL

## 2018-12-21 MED ORDER — SODIUM CHLORIDE 0.9 % IV SOLN: 250.0000 mL | INTRAVENOUS | Status: DC | PRN

## 2018-12-21 MED ORDER — CHLORHEXIDINE GLUCONATE 0.12 % MT SOLN
OROMUCOSAL | Status: AC
  Administered 2018-12-21: 15 mL via OROMUCOSAL
  Filled 2018-12-21: qty 15

## 2018-12-21 MED ORDER — ACETAMINOPHEN 650 MG RE SUPP: 650.0000 mg | Freq: Four times a day (QID) | RECTAL | Status: DC | PRN

## 2018-12-21 MED ORDER — CITALOPRAM HYDROBROMIDE 20 MG PO TABS
20.0000 mg | ORAL_TABLET | Freq: Every day | ORAL | Status: DC
  Administered 2018-12-21 – 2018-12-22 (×2): 20 mg via ORAL
  Filled 2018-12-21 (×2): qty 1

## 2018-12-21 MED ORDER — DEXAMETHASONE SODIUM PHOSPHATE 10 MG/ML IJ SOLN
INTRAMUSCULAR | Status: DC | PRN
  Administered 2018-12-21: 5 mg via INTRAVENOUS

## 2018-12-21 MED ORDER — ONDANSETRON HCL 4 MG/2ML IJ SOLN
INTRAMUSCULAR | Status: DC | PRN
  Administered 2018-12-21: 4 mg via INTRAVENOUS

## 2018-12-21 MED ORDER — PROPOFOL 10 MG/ML IV BOLUS
INTRAVENOUS | Status: DC | PRN
  Administered 2018-12-21: 30 mg via INTRAVENOUS
  Administered 2018-12-21: 20 mg via INTRAVENOUS

## 2018-12-21 MED ORDER — SUGAMMADEX SODIUM 200 MG/2ML IV SOLN
INTRAVENOUS | Status: DC | PRN
  Administered 2018-12-21: 200 mg via INTRAVENOUS

## 2018-12-21 MED ORDER — OXYMETAZOLINE HCL 0.05 % NA SOLN: 1.0000 | Freq: Two times a day (BID) | NASAL | Status: DC

## 2018-12-21 MED ORDER — CHLORHEXIDINE GLUCONATE 4 % EX LIQD: 30.0000 mL | CUTANEOUS | Status: DC

## 2018-12-21 MED ORDER — PHENYLEPHRINE HCL-NACL 20-0.9 MG/250ML-% IV SOLN: 0.0000 ug/min | INTRAVENOUS | Status: DC

## 2018-12-21 MED ORDER — SODIUM CHLORIDE 0.9% FLUSH: 3.0000 mL | INTRAVENOUS | Status: DC | PRN

## 2018-12-21 MED ORDER — PROTAMINE SULFATE 10 MG/ML IV SOLN
INTRAVENOUS | Status: DC | PRN
  Administered 2018-12-21: 10 mg via INTRAVENOUS
  Administered 2018-12-21: 40 mg via INTRAVENOUS

## 2018-12-21 MED ORDER — SODIUM CHLORIDE 0.9 % IV SOLN
INTRAVENOUS | Status: DC | PRN
  Administered 2018-12-21: 08:00:00 500 mL

## 2018-12-21 MED ORDER — SODIUM CHLORIDE 0.9 % IV SOLN: INTRAVENOUS | Status: DC

## 2018-12-21 MED ORDER — LACTATED RINGERS IV SOLN
INTRAVENOUS | Status: DC | PRN
  Administered 2018-12-21: 07:00:00 via INTRAVENOUS

## 2018-12-21 MED ORDER — TRAMADOL HCL 50 MG PO TABS
50.0000 mg | ORAL_TABLET | Freq: Four times a day (QID) | ORAL | Status: DC | PRN
  Administered 2018-12-21: 50 mg via ORAL
  Filled 2018-12-21: qty 1

## 2018-12-21 MED ORDER — HEPARIN SODIUM (PORCINE) 1000 UNIT/ML IJ SOLN
INTRAMUSCULAR | Status: DC | PRN
  Administered 2018-12-21: 5000 [IU] via INTRAVENOUS

## 2018-12-21 MED ORDER — SIMVASTATIN 20 MG PO TABS
20.0000 mg | ORAL_TABLET | Freq: Every day | ORAL | Status: DC
  Administered 2018-12-21: 20 mg via ORAL
  Filled 2018-12-21: qty 1

## 2018-12-21 MED ORDER — MIDAZOLAM HCL 2 MG/2ML IJ SOLN
INTRAMUSCULAR | Status: DC | PRN
  Administered 2018-12-21 (×2): 1 mg via INTRAVENOUS

## 2018-12-21 MED ORDER — SUGAMMADEX SODIUM 200 MG/2ML IV SOLN: INTRAVENOUS | Status: DC | PRN

## 2018-12-21 SURGICAL SUPPLY — 97 items
ADH SKN CLS APL DERMABOND .7 (GAUZE/BANDAGES/DRESSINGS) ×2
BAG DECANTER FOR FLEXI CONT (MISCELLANEOUS) IMPLANT
BAG SNAP BAND KOVER 36X36 (MISCELLANEOUS) ×4 IMPLANT
BLADE CLIPPER SURG (BLADE) IMPLANT
BLADE OSCILLATING /SAGITTAL (BLADE) IMPLANT
BLADE STERNUM SYSTEM 6 (BLADE) IMPLANT
CABLE ADAPT CONN TEMP 6FT (ADAPTER) ×4 IMPLANT
CANNULA FEM VENOUS REMOTE 22FR (CANNULA) IMPLANT
CANNULA OPTISITE PERFUSION 16F (CANNULA) IMPLANT
CANNULA OPTISITE PERFUSION 18F (CANNULA) IMPLANT
CATH DIAG EXPO 6F AL1 (CATHETERS) IMPLANT
CATH DIAG EXPO 6F VENT PIG 145 (CATHETERS) ×8 IMPLANT
CATH EXTERNAL FEMALE PUREWICK (CATHETERS) IMPLANT
CATH INFINITI 6F AL2 (CATHETERS) IMPLANT
CATH S G BIP PACING (CATHETERS) ×4 IMPLANT
CLIP VESOCCLUDE MED 24/CT (CLIP) IMPLANT
CLIP VESOCCLUDE SM WIDE 24/CT (CLIP) IMPLANT
CLOSURE MYNX CONTROL 6F/7F (Vascular Products) ×2 IMPLANT
CONT SPEC 4OZ CLIKSEAL STRL BL (MISCELLANEOUS) ×8 IMPLANT
COVER BACK TABLE 24X17X13 BIG (DRAPES) IMPLANT
COVER BACK TABLE 80X110 HD (DRAPES) ×8 IMPLANT
COVER DOME SNAP 22 D (MISCELLANEOUS) IMPLANT
COVER WAND RF STERILE (DRAPES) ×4 IMPLANT
DERMABOND ADVANCED (GAUZE/BANDAGES/DRESSINGS) ×2
DERMABOND ADVANCED .7 DNX12 (GAUZE/BANDAGES/DRESSINGS) ×2 IMPLANT
DEVICE CLOSURE PERCLS PRGLD 6F (VASCULAR PRODUCTS) ×4 IMPLANT
DRAPE INCISE IOBAN 66X45 STRL (DRAPES) IMPLANT
DRSG TEGADERM 4X4.75 (GAUZE/BANDAGES/DRESSINGS) ×6 IMPLANT
ELECT CAUTERY BLADE 6.4 (BLADE) IMPLANT
ELECT REM PT RETURN 9FT ADLT (ELECTROSURGICAL) ×8
ELECTRODE REM PT RTRN 9FT ADLT (ELECTROSURGICAL) ×4 IMPLANT
FELT TEFLON 6X6 (MISCELLANEOUS) IMPLANT
FEMORAL VENOUS CANN RAP (CANNULA) IMPLANT
GAUZE SPONGE 2X2 8PLY STRL LF (GAUZE/BANDAGES/DRESSINGS) IMPLANT
GAUZE SPONGE 4X4 12PLY STRL (GAUZE/BANDAGES/DRESSINGS) ×4 IMPLANT
GLOVE BIO SURGEON STRL SZ7.5 (GLOVE) IMPLANT
GLOVE BIO SURGEON STRL SZ8 (GLOVE) IMPLANT
GLOVE EUDERMIC 7 POWDERFREE (GLOVE) IMPLANT
GLOVE ORTHO TXT STRL SZ7.5 (GLOVE) IMPLANT
GOWN STRL REUS W/ TWL LRG LVL3 (GOWN DISPOSABLE) IMPLANT
GOWN STRL REUS W/ TWL XL LVL3 (GOWN DISPOSABLE) ×2 IMPLANT
GOWN STRL REUS W/TWL LRG LVL3 (GOWN DISPOSABLE)
GOWN STRL REUS W/TWL XL LVL3 (GOWN DISPOSABLE) ×4
GUIDEWIRE SAFE TJ AMPLATZ EXST (WIRE) ×4 IMPLANT
INSERT FOGARTY SM (MISCELLANEOUS) IMPLANT
KIT BASIN OR (CUSTOM PROCEDURE TRAY) ×4 IMPLANT
KIT DILATOR VASC 18G NDL (KITS) IMPLANT
KIT HEART LEFT (KITS) ×4 IMPLANT
KIT SUCTION CATH 14FR (SUCTIONS) IMPLANT
KIT TURNOVER KIT B (KITS) ×4 IMPLANT
LOOP VESSEL MAXI BLUE (MISCELLANEOUS) IMPLANT
LOOP VESSEL MINI RED (MISCELLANEOUS) IMPLANT
NDL PERC 18GX7CM (NEEDLE) ×2 IMPLANT
NEEDLE 22X1 1/2 (OR ONLY) (NEEDLE) IMPLANT
NEEDLE PERC 18GX7CM (NEEDLE) ×4 IMPLANT
NS IRRIG 1000ML POUR BTL (IV SOLUTION) ×12 IMPLANT
PACK ENDOVASCULAR (PACKS) ×4 IMPLANT
PAD ARMBOARD 7.5X6 YLW CONV (MISCELLANEOUS) ×8 IMPLANT
PAD ELECT DEFIB RADIOL ZOLL (MISCELLANEOUS) ×4 IMPLANT
PENCIL BUTTON HOLSTER BLD 10FT (ELECTRODE) ×4 IMPLANT
PERCLOSE PROGLIDE 6F (VASCULAR PRODUCTS) ×8
POSITIONER HEAD DONUT 9IN (MISCELLANEOUS) ×4 IMPLANT
SET MICROPUNCTURE 5F STIFF (MISCELLANEOUS) ×4 IMPLANT
SHEATH BRITE TIP 6FR 35CM (SHEATH) ×4 IMPLANT
SHEATH PINNACLE 6F 10CM (SHEATH) ×4 IMPLANT
SHEATH PINNACLE 8F 10CM (SHEATH) ×4 IMPLANT
SLEEVE REPOSITIONING LENGTH 30 (MISCELLANEOUS) ×4 IMPLANT
SPONGE GAUZE 2X2 STER 10/PKG (GAUZE/BANDAGES/DRESSINGS) ×2
SPONGE LAP 4X18 RFD (DISPOSABLE) IMPLANT
STOPCOCK MORSE 400PSI 3WAY (MISCELLANEOUS) ×8 IMPLANT
SUT ETHIBOND X763 2 0 SH 1 (SUTURE) IMPLANT
SUT GORETEX CV 4 TH 22 36 (SUTURE) IMPLANT
SUT GORETEX CV4 TH-18 (SUTURE) IMPLANT
SUT MNCRL AB 3-0 PS2 18 (SUTURE) IMPLANT
SUT PROLENE 5 0 C 1 36 (SUTURE) IMPLANT
SUT PROLENE 6 0 C 1 30 (SUTURE) IMPLANT
SUT SILK  1 MH (SUTURE) ×2
SUT SILK 1 MH (SUTURE) ×2 IMPLANT
SUT SILK 3 0 (SUTURE) ×4
SUT SILK 3 0 SH CR/8 (SUTURE) ×2 IMPLANT
SUT SILK 3-0 18XBRD TIE 12 (SUTURE) IMPLANT
SUT VIC AB 1 CTX 27 (SUTURE) ×2 IMPLANT
SUT VIC AB 2-0 CT1 27 (SUTURE) ×8
SUT VIC AB 2-0 CT1 TAPERPNT 27 (SUTURE) IMPLANT
SUT VIC AB 2-0 CTX 36 (SUTURE) IMPLANT
SUT VIC AB 3-0 SH 8-18 (SUTURE) IMPLANT
SUT VIC AB 3-0 X1 27 (SUTURE) ×2 IMPLANT
SYR 50ML LL SCALE MARK (SYRINGE) ×4 IMPLANT
SYR BULB IRRIGATION 50ML (SYRINGE) IMPLANT
SYR CONTROL 10ML LL (SYRINGE) IMPLANT
TOWEL GREEN STERILE (TOWEL DISPOSABLE) ×4 IMPLANT
TOWEL GREEN STERILE FF (TOWEL DISPOSABLE) ×4 IMPLANT
TRANSDUCER W/STOPCOCK (MISCELLANEOUS) ×8 IMPLANT
TRAY FOLEY SLVR 16FR TEMP STAT (SET/KITS/TRAYS/PACK) IMPLANT
VALVE 23 ULTRA SAPIEN KIT (Valve) ×2 IMPLANT
WIRE EMERALD 3MM-J .035X150CM (WIRE) ×4 IMPLANT
WIRE EMERALD 3MM-J .035X260CM (WIRE) ×4 IMPLANT

## 2018-12-21 NOTE — Anesthesia Procedure Notes (Signed)
Arterial Line Insertion Start/End9/22/2020 7:10 AM Performed by: Elayne Snare, CRNA, CRNA  Preanesthetic checklist: patient identified, IV checked, risks and benefits discussed, surgical consent and monitors and equipment checked Lidocaine 1% used for infiltration Left, radial was placed Catheter size: 20 G Hand hygiene performed  and maximum sterile barriers used  Allen's test indicative of satisfactory collateral circulation Attempts: 2 Procedure performed without using ultrasound guided technique. Following insertion, dressing applied and Biopatch. Post procedure assessment: normal  Patient tolerated the procedure well with no immediate complications.

## 2018-12-21 NOTE — Progress Notes (Signed)
  Echocardiogram Echocardiogram Transesophageal has been performed.  Bobbye Charleston 12/21/2018, 9:39 AM

## 2018-12-21 NOTE — CV Procedure (Addendum)
[facility]  TAVR OPERATIVE NOTE   Date of Procedure:  12/21/2018  Preoperative Diagnosis: Severe Aortic Stenosis   Postoperative Diagnosis: Same   Procedure:    Transcatheter Aortic Valve Replacement - Trans Right Subclavian Approach  Edwards Sapien 3 THV (size 23 mm, model [id number] , serial # [id number])   Co-Surgeons:  Lauree Chandler, MD and Gaye Pollack, MD   Anesthesiologist:  Ermalene Postin  Echocardiographer:  Croitoru  Pre-operative Echo Findings:  Severe aortic stenosis  Normal left ventricular systolic function  Post-operative Echo Findings:  No paravalvular leak  Normal left ventricular systolic function  BRIEF CLINICAL NOTE AND INDICATIONS FOR SURGERY  83 yo female with history of ongoing tobacco abuse, anxiety, depression, endometrial cancer, hyperlipidemia, HTN, 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 She was admitted to [facility] 10/20/18 with advanced heart block (2:1 AV block with intermittent complete heart block) and a permanent pacemaker was placed on 10/20/18. Echo 10/20/18 with LVEF greater than 65%. The aortic valve leaflets are thickened and calcified with limited leaflet mobility consistent with severe aortic stenosis. (mean gradient 42 mmHg, peak gradient 67 mmHg, AVA 0.48 cm2, dimensionless index 0.21). She had an uneventful hospital course after her pacemaker was placed. Ongoing dyspnea and dizziness.   During the course of the patient's preoperative work up they have been evaluated comprehensively by a multidisciplinary team of specialists coordinated through the [facility] in the [facility].  They have been demonstrated to suffer from symptomatic severe aortic stenosis as noted above. The patient has been counseled extensively as to the relative risks and benefits of all options for the treatment of severe aortic stenosis including long term medical  therapy, conventional surgery for aortic valve replacement, and transcatheter aortic valve replacement.  The patient has been independently evaluated by Dr. Cyndia Bent with CT surgery and they are felt to be at high risk for conventional surgical aortic valve replacement. The surgeon indicated the patient would be a poor candidate for conventional surgery. Based upon review of all of the patient's preoperative diagnostic tests they are felt to be candidate for transcatheter aortic valve replacement using the transfemoral approach as an alternative to high risk conventional surgery.    Following the decision to proceed with transcatheter aortic valve replacement, a discussion has been held regarding what types of management strategies would be attempted intraoperatively in the event of life-threatening complications, including whether or not the patient would be considered a candidate for the use of cardiopulmonary bypass and/or conversion to open sternotomy for attempted surgical intervention.  The patient has been advised of a variety of complications that might develop peculiar to this approach including but not limited to risks of death, stroke, paravalvular leak, aortic dissection or other major vascular complications, aortic annulus rupture, device embolization, cardiac rupture or perforation, acute myocardial infarction, arrhythmia, heart block or bradycardia requiring permanent pacemaker placement, congestive heart failure, respiratory failure, renal failure, pneumonia, infection, other late complications related to structural valve deterioration or migration, or other complications that might ultimately cause a temporary or permanent loss of functional independence or other long term morbidity.  The patient provides full informed consent for the procedure as described and all questions were answered preoperatively.    DETAILS OF THE OPERATIVE PROCEDURE  PREPARATION:   The patient is brought to the  operating room on the above mentioned date and central monitoring was established by the anesthesia team including placement of a radial  arterial line. The patient is placed in the supine position on the operating table.  Intravenous antibiotics are administered. General anesthesia is used.   Baseline transesophageal echocardiogram was performed. The patient's chest, abdomen, both groins, and both lower extremities are prepared and draped in a sterile manner. A time out procedure is performed.   PERIPHERAL ACCESS:   Using the modified Seldinger technique, femoral arterial and venous access were obtained with placement of 6 Fr sheaths on the left side using u/s guidance.  A pigtail diagnostic catheter was passed through the femoral arterial sheath under fluoroscopic guidance into the aortic root.  A temporary transvenous pacemaker catheter was passed through the femoral venous sheath under fluoroscopic guidance into the right ventricle.  The pacemaker was tested to ensure stable lead placement and pacemaker capture. Aortic root angiography was performed in order to determine the optimal angiographic angle for valve deployment.  TRANS Right Subclavian Artery ACCESS:  Please see Dr. Vivi Martens operative note regarding access into the right subclavian artery. The patient was heparinized systemically and ACT verified > 250 seconds.  A needle was used to enter the artery. A J wire was then passed through the needle into the ascending aorta. A 8 French sheath was then placed over the wire. An AL-1 catheter was used to cross the aortic valve with a straight wire. This was changed over a long J wire for a pigtail catheter. LV pressure gradient measured. An Amplatz Extra stiff wire was then advanced into the pigtail and placed in the LV apex. A 14 Fr transfemoral E-sheath was introduced into the subclavian artery.   TRANSCATHETER HEART VALVE DEPLOYMENT:  An Edwards Sapien 3 THV (size 23 mm) was prepared and crimped  per manufacturer's guidelines, and the proper orientation of the valve is confirmed on the [employer] delivery system. The valve was advanced through the introducer sheath using normal technique until in an appropriate position in the ascending aorta. The balloon was then retracted and using the fine-tuning wheel was centered on the valve. The valve was then advanced across the aortic valve using appropriate flexion of the catheter. The valve was carefully positioned across the aortic valve annulus. The Commander catheter was retracted using normal technique. Once final position of the valve has been confirmed by angiographic assessment, the valve is deployed while temporarily holding ventilation and during rapid ventricular pacing to maintain systolic blood pressure < 50 mmHg and pulse pressure < 10 mmHg. The balloon inflation is held for >3 seconds after reaching full deployment volume. Once the balloon has fully deflated the balloon is retracted into the ascending aorta and valve function is assessed using TEE. There is felt to be no paravalvular leak and no central aortic insufficiency.  The patient's hemodynamic recovery following valve deployment is good.  The deployment balloon and guidewire are both removed. Echo demostrated acceptable post-procedural gradients, stable mitral valve function, and no AI.   PROCEDURE COMPLETION:  The sheath was then removed and the surgical site was closed by Dr. Cyndia Bent. The temporary pacemaker, pigtail catheters and femoral sheaths were removed with a Mynx closure device placed in the left femoral artery. Manual pressure used for venous hemostasis.    The patient tolerated the procedure well and is transported to the surgical intensive care in stable condition. There were no immediate intraoperative complications. All sponge instrument and needle counts are verified correct at completion of the operation.   No blood products were administered during the  operation.  The patient received a total  of 42 mL of intravenous contrast during the procedure.  Lauree Chandler MD 12/21/2018 9:52 AM

## 2018-12-21 NOTE — Progress Notes (Signed)
Pt received from cath lab. VSS. LG level 0. R chest incision c/d/i. CHG complete. Telemetry applied. Pt oriented to room and unit. Call light in reach. Will continue to monitor.  Clyde Canterbury, RN

## 2018-12-21 NOTE — Transfer of Care (Signed)
Immediate Anesthesia Transfer of Care Note  Patient: [name]  Procedure(s) Performed: TRANSCATHETER AORTIC VALVE REPLACEMENT, RIGHT SUBCLAVIAN (N/A Chest) TRANSESOPHAGEAL ECHOCARDIOGRAM (TEE) (N/A )  Patient Location: PACU  Anesthesia Type:General  Level of Consciousness: awake, alert  and patient cooperative  Airway & Oxygen Therapy: Patient Spontanous Breathing  Post-op Assessment: Report given to RN and Post -op Vital signs reviewed and stable  Post vital signs: Reviewed and stable  Last Vitals:  Vitals Value Taken Time  BP 105/33 12/21/18 1018  Temp 36.7 C 12/21/18 1021  Pulse 68 12/21/18 1021  Resp 12 12/21/18 1021  SpO2 94 % 12/21/18 1021  Vitals shown include unvalidated device data.  Last Pain:  Vitals:   12/21/18 1021  TempSrc:   PainSc: 0-No pain      Patients Stated Pain Goal: 4 ([id number])  Complications: No apparent anesthesia complications

## 2018-12-21 NOTE — Interval H&P Note (Signed)
History and Physical Interval Note:  12/21/2018 7:23 AM  [name]  has presented today for surgery, with the diagnosis of Severe Aortic Stenosis.  The various methods of treatment have been discussed with the patient and family. After consideration of risks, benefits and other options for treatment, the patient has consented to  Procedure(s): TRANSCATHETER AORTIC VALVE REPLACEMENT, RIGHT SUBCLAVIAN (N/A) TRANSESOPHAGEAL ECHOCARDIOGRAM (TEE) (N/A) as a surgical intervention.  The patient's history has been reviewed, patient examined, no change in status, stable for surgery.  I have reviewed the patient's chart and labs.  Questions were answered to the patient's satisfaction.     Gaye Pollack

## 2018-12-21 NOTE — Discharge Instructions (Signed)
ACTIVITY AND EXERCISE °• Daily activity and exercise are an important part of your recovery. People recover at different rates depending on their general health and type of valve procedure. °• Most people recovering from TAVR feel better relatively quickly  °• No lifting, pushing, pulling more than 10 pounds (examples to avoid: groceries, vacuuming, gardening, golfing): °            - For one week with a procedure through the groin. °            - For six weeks for procedures through the chest wall or neck. °NOTE: You will typically see one of our providers 7-14 days after your procedure to discuss WHEN TO RESUME the above activities.  °  °  °DRIVING °• Do not drive until you are seen for follow up and cleared by a provider. Generally, we ask patient to not drive for 1 week after their procedure. °• If you have been told by your doctor in the past that you may not drive, you must talk with him/her before you begin driving again. °  °DRESSING °• Groin site: you may leave the clear dressing over the site for up to one week or until it falls off. °  °HYGIENE °• If you had a femoral (leg) procedure, you may take a shower when you return home. After the shower, pat the site dry. Do NOT use powder, oils or lotions in your groin area until the site has completely healed. °• If you had a chest procedure, you may shower when you return home unless specifically instructed not to by your discharging practitioner. °            - DO NOT scrub incision; pat dry with a towel. °            - DO NOT apply any lotions, oils, powders to the incision. °            - No tub baths / swimming for at least 2 weeks. °• If you notice any fevers, chills, increased pain, swelling, bleeding or pus, please contact your doctor. °  °ADDITIONAL INFORMATION °• If you are going to have an upcoming dental procedure, please contact our office as you will require antibiotics ahead of time to prevent infection on your heart valve.  ° ° °If you have any  questions or concerns you can call the structural heart phone during normal business hours 8am-4pm. If you have an urgent need after hours or weekends please call [phone] to talk to the on call provider for general cardiology. If you have an emergency that requires immediate attention, please call 911.  ° ° °After TAVR Checklist ° °Check  Test Description  ° Follow up appointment in 1-2 weeks  You will see our structural heart physician assistant, Katie Domenic Schoenberger. Your incision sites will be checked and you will be cleared to drive and resume all normal activities if you are doing well.    ° 1 month echo and follow up  You will have an echo to check on your new heart valve and be seen back in the office by Katie Taimane Stimmel. Many times the echo is not read by your appointment time, but Katie will call you later that day or the following day to report your results.  ° Follow up with your primary cardiologist You will need to be seen by your primary cardiologist in the following 3-6 months after your 1 month appointment in the valve   clinic. Often times your Plavix or Aspirin will be discontinued during this time, but this is decided on a case by case basis.   ° 1 year echo and follow up You will have another echo to check on your heart valve after 1 year and be seen back in the office by Katie Niah Heinle. This your last structural heart visit.  ° Bacterial endocarditis prophylaxis  You will have to take antibiotics for the rest of your life before all dental procedures (even teeth cleanings) to protect your heart valve. Antibiotics are also required before some surgeries. Please check with your cardiologist before scheduling any surgeries. Also, please make sure to tell us if you have a penicillin allergy as you will require an alternative antibiotic.   ° ° °

## 2018-12-21 NOTE — Anesthesia Procedure Notes (Signed)
Procedure Name: Intubation Date/Time: 12/21/2018 7:47 AM Performed by: Elayne Snare, CRNA Pre-anesthesia Checklist: Patient identified, Emergency Drugs available, Suction available and Patient being monitored Patient Re-evaluated:Patient Re-evaluated prior to induction Oxygen Delivery Method: Circle System Utilized Preoxygenation: Pre-oxygenation with 100% oxygen Induction Type: IV induction Ventilation: Mask ventilation without difficulty Laryngoscope Size: Mac and 3 Grade View: Grade I Tube type: Oral Tube size: 7.0 mm Number of attempts: 1 Airway Equipment and Method: Stylet Placement Confirmation: ETT inserted through vocal cords under direct vision,  positive ETCO2 and breath sounds checked- equal and bilateral Secured at: 21 cm Tube secured with: Tape Dental Injury: Teeth and Oropharynx as per pre-operative assessment

## 2018-12-21 NOTE — Progress Notes (Signed)
Pt ambulated in hallway 335ft. Tolerated well. Returned to bed. Call light in reach.  Clyde Canterbury, RN

## 2018-12-21 NOTE — Op Note (Signed)
[facility]   MULTIDISCIPLINARY HEART VALVE TEAM   TAVR OPERATIVE NOTE   Date of Procedure:  12/21/2018  Preoperative Diagnosis: Severe Aortic Stenosis   Postoperative Diagnosis: Same   Procedure:    Transcatheter Aortic Valve Replacement - Right Subclavian Approach  Edwards Sapien 3 Ultra THV (size 23 mm, model # 9750TFX, serial # [id number])   Co-Surgeons:  Gaye Pollack, MD and Lauree Chandler, MD    Anesthesiologist:  C. Ermalene Postin, MD  Echocardiographer:  Jerilynn Mages. Croitoru, MD  Pre-operative Echo Findings:  Severe aortic stenosis  Normal left ventricular systolic function  Post-operative Echo Findings:  No paravalvular leak  Normal left ventricular systolic function   BRIEF CLINICAL NOTE AND INDICATIONS FOR SURGERY  This 83 year old woman has stage D, severe, symptomatic aortic stenosis with New York Heart Association class II symptoms of exertional fatigue and tiredness consistent with chronic diastolic congestive heart failure. I have personally reviewed her 2D echocardiogram, cardiac catheterization, and CTA studies. Her 2D echocardiogram shows a trileaflet aortic valve with severe thickening and calcification and restricted mobility. The mean gradient across aortic valve is 42 mmHg consistent with severe aortic stenosis. Left ventricular systolic function was normal. Cardiac catheterization shows mild nonobstructive coronary disease. The mean gradient across aortic valve is 39 mmHg with a peak to peak gradient of 47 mmHg and an aortic valve area of 0.48 cm consistent with severe aortic stenosis. I agree that aortic valve replacement is indicated in this patient. Given her advanced age I think transcatheter aortic valve replacement is probably the best option for her. Her gated cardiac CTA shows anatomy suitable for transcatheter aortic valve replacement using a Sapien 3 valve. Unfortunately her abdominal and pelvic CTA shows occlusion of the right common  iliac artery and severe concentric calcified atherosclerosis of the left common iliac artery which precludes transfemoral insertion. Her left subclavian artery appears to be of adequate size although she recently had a left subclavian vein permanent pacemaker placement so I think it is probably better to avoid the site. Her left common carotid artery appears to be of adequate size but she has a 60 to 79% left internal carotid artery stenosis. The right carotid artery also appears to be of adequate size. The right subclavian artery appears to be of adequate size but making the turn into the innominate artery may be difficult. Transaortic or transapical are other options available if necessary. I think we will try a right subclavian insertion first.  The patient was counseled at length regarding treatment alternatives for management of severe symptomatic aortic stenosis. The risks and benefits of surgical intervention has been discussed in detail. Long-term prognosis with medical therapy was discussed. Alternative approaches such as conventional surgical aortic valve replacement, transcatheter aortic valve replacement, and palliative medical therapy were compared and contrasted at length. This discussion was placed in the context of the patient's own specific clinical presentation and past medical history. All of her questions have been addressed.  Following the decision to proceed with transcatheter aortic valve replacement, a discussion was held regarding what types of management strategies would be attempted intraoperatively in the event of life-threatening complications, including whether or not the patient would be considered a candidate for the use of cardiopulmonary bypass and/or conversion to open sternotomy for attempted surgical intervention. She is 83 years old but is a low risk surgical patient and would be a candidate for emergent sternotomy to manage any intraoperative complications.   The patient  has been advised of a variety  of complications that might develop including but not limited to risks of death, stroke, paravalvular leak, aortic dissection or other major vascular complications, aortic annulus rupture, device embolization, cardiac rupture or perforation, mitral regurgitation, acute myocardial infarction, arrhythmia, heart block or bradycardia requiring permanent pacemaker placement, congestive heart failure, respiratory failure, renal failure, pneumonia, infection, other late complications related to structural valve deterioration or migration, or other complications that might ultimately cause a temporary or permanent loss of functional independence or other long term morbidity. The patient provides full informed consent for the procedure as described and all questions were answered.     DETAILS OF THE OPERATIVE PROCEDURE  PREPARATION:    The patient is brought to the operating room on the above mentioned date and appropriate monitoring was established by the anesthesia team. The patient is placed in the supine position on the operating table.  Intravenous antibiotics are administered.  General endotracheal anesthesia is induced uneventfully.  Baseline transesophageal echocardiogram was performed. The patient's chest, abdomen, both groins, and both lower extremities are prepared and draped in a sterile manner. A time out procedure is performed.   PERIPHERAL ACCESS:    Using the modified Seldinger technique, femoral arterial and venous access was obtained with placement of 6 Fr sheaths on the left side.  A pigtail diagnostic catheter was passed through the left arterial sheath under fluoroscopic guidance into the aortic root.  A temporary transvenous pacemaker catheter was passed through the left femoral venous sheath under fluoroscopic guidance into the right ventricle.  The pacemaker was tested to ensure stable lead placement and pacemaker capture. Aortic root angiography was  performed in order to determine the optimal angiographic angle for valve deployment.  LEFT SUBCLAVIAN ACCESS:   A transverse incision was made below the right clavicle and carried down through the subcutaneous tissue using electrocautery. The pectoralis major muscle was split along its fibers and the pectoralis minor muscle retracted laterally. The right axillary artery was identified and encircled with a vessel loop. The patient was heparinized systemically and ACT verified > 250 seconds.  A double concentric purse string suture of CV-4 gortex was placed in the anterior wall of the artery. The artery was cannulated with a needle and a J- wire advanced into the ascending aorta. An 8 F sheath was inserted over the wire. The aortic valve was crossed with an AL-1catheter and a straight wire. This was exchanged for a pigtail catheter and position was confirmed in the LV apex. Simultaneous LV and Ao pressures were recorded.  The pigtail catheter was exchanged for an Amplatz Extra-stiff wire in the LV apex.  Then a 70F E-sheath was inserted into the axillary artery and the tip advanced into the aortic arch.  BALLOON AORTIC VALVULOPLASTY:   Not performed.  TRANSCATHETER HEART VALVE DEPLOYMENT:   An Edwards Sapien 3 Ultra transcatheter heart valve (size 23 mm, model #9750TFX, serial [id number]) was prepared and crimped per manufacturer's guidelines, and the proper orientation of the valve is confirmed on the [employer] delivery system. The valve was advanced through the introducer sheath using normal technique until in an appropriate position in the ascending aorta beyond the sheath tip. The balloon was then retracted and using the fine-tuning wheel was centered on the valve. The valve was then advanced across the aortic arch using appropriate flexion of the catheter. The valve was carefully positioned across the aortic valve annulus. The Commander catheter was retracted using normal technique. Once final  position of the valve has been confirmed by  angiographic assessment, the valve is deployed while temporarily holding ventilation and during rapid ventricular pacing to maintain systolic blood pressure < 50 mmHg and pulse pressure < 10 mmHg. The balloon inflation is held for >3 seconds after reaching full deployment volume. Once the balloon has fully deflated the balloon is retracted into the ascending aorta and valve function is assessed using echocardiography. There is felt to be trivial paravalvular leak and no central aortic insufficiency. Post-procedural gradients were acceptable. The patient's hemodynamic recovery following valve deployment is good.  The deployment balloon and guidewire are both removed.   PROCEDURE COMPLETION:   The sheath was removed and axillary artery closure performed using the purse string sutures.  Protamine was administered once axillary arterial repair was complete. The temporary pacemaker, pigtail catheters and femoral sheaths were removed with manual pressure used for hemostasis.  A Mynx femoral closure device was utilized following removal of the diagnostic sheath in the left femoral artery.  The patient tolerated the procedure well and is transported to the surgical intensive care in stable condition. There were no immediate intraoperative complications. All sponge instrument and needle counts are verified correct at completion of the operation.   No blood products were administered during the operation.  The patient received a total of 42 mL of intravenous contrast during the procedure.   Gaye Pollack, MD 12/21/2018 4:21 PM

## 2018-12-21 NOTE — Brief Op Note (Signed)
12/21/2018  9:54 AM  PATIENT:  [name]  83 y.o. female  PRE-OPERATIVE DIAGNOSIS:  Severe Aortic Stenosis  POST-OPERATIVE DIAGNOSIS:  Severe Aortic Stenosis  PROCEDURE:  Procedure(s): TRANSCATHETER AORTIC VALVE REPLACEMENT, RIGHT SUBCLAVIAN (N/A) TRANSESOPHAGEAL ECHOCARDIOGRAM (TEE) (N/A)  SURGEON:  Surgeon(s) and Role:    * Burnell Blanks, MD - Primary    * Bartle, Fernande Boyden, MD - Co-Surgeon (only for TAVR, EVAR, and Barostim procedure)  PHYSICIAN ASSISTANT: none  ASSISTANTS: none   ANESTHESIA:   general  EBL:  minimal   BLOOD ADMINISTERED:none  DRAINS: none   LOCAL MEDICATIONS USED:  NONE  SPECIMEN:  No Specimen  DISPOSITION OF SPECIMEN:  N/A  COUNTS:  YES  TOURNIQUET:  * No tourniquets in log *  DICTATION: .Note written in EPIC  PLAN OF CARE: Admit to inpatient   PATIENT DISPOSITION:  cath lab recovery   Delay start of Pharmacological VTE agent (>24hrs) due to surgical blood loss or risk of bleeding: yes

## 2018-12-21 NOTE — Anesthesia Preprocedure Evaluation (Addendum)
Anesthesia Evaluation  Patient identified by MRN, date of birth, ID band Patient awake    Reviewed: Allergy & Precautions, NPO status , Patient's Chart, lab work & pertinent test results  History of Anesthesia Complications Negative for: history of anesthetic complications  Airway Mallampati: II  TM Distance: >3 FB Neck ROM: Full    Dental  (+) Dental Advisory Given   Pulmonary neg recent URI, Current Smoker and Patient abstained from smoking.,    breath sounds clear to auscultation       Cardiovascular hypertension, Pt. on medications + CAD  + dysrhythmias + pacemaker + Valvular Problems/Murmurs AS  Rhythm:Regular + Systolic murmurs    Neuro/Psych PSYCHIATRIC DISORDERS Anxiety Depression negative neurological ROS     GI/Hepatic negative GI ROS, Neg liver ROS,   Endo/Other  negative endocrine ROS  Renal/GU negative Renal ROS     Musculoskeletal negative musculoskeletal ROS (+)   Abdominal   Peds  Hematology negative hematology ROS (+)   Anesthesia Other Findings  1. The left ventricle has hyperdynamic systolic function, with an ejection fraction of >65%. The cavity size was normal. Left ventricular diastolic Doppler parameters are consistent with impaired relaxation.  2. The right ventricle has normal systolic function. The cavity was normal. There is no increase in right ventricular wall thickness.  3. There is mild mitral annular calcification present.  4. The aortic valve is tricuspid. Severely thickening of the aortic valve. Severe calcifcation of the aortic valve. Aortic valve regurgitation was not assessed by color flow Doppler. Severe stenosis of the aortic valve.  5. Aortic valve peak velocity: 4.55m/s, mean gradient 76mmHg.  6. The aorta is normal in size and structure.  Reproductive/Obstetrics                            Anesthesia Physical Anesthesia Plan  ASA: IV  Anesthesia  Plan: General   Post-op Pain Management:    Induction: Intravenous  PONV Risk Score and Plan: 2 and Treatment may vary due to age or medical condition, Ondansetron and Dexamethasone  Airway Management Planned: Oral ETT  Additional Equipment: Arterial line  Intra-op Plan:   Post-operative Plan: Extubation in OR  Informed Consent: I have reviewed the patients History and Physical, chart, labs and discussed the procedure including the risks, benefits and alternatives for the proposed anesthesia with the patient or authorized representative who has indicated his/her understanding and acceptance.     Dental advisory given  Plan Discussed with: CRNA and Surgeon  Anesthesia Plan Comments:         Anesthesia Quick Evaluation

## 2018-12-22 ENCOUNTER — Encounter (HOSPITAL_COMMUNITY): Payer: Self-pay | Admitting: Cardiovascular Disease

## 2018-12-22 ENCOUNTER — Inpatient Hospital Stay (HOSPITAL_COMMUNITY): Payer: Medicare HMO

## 2018-12-22 DIAGNOSIS — Z954 Presence of other heart-valve replacement: Secondary | ICD-10-CM

## 2018-12-22 DIAGNOSIS — I350 Nonrheumatic aortic (valve) stenosis: Secondary | ICD-10-CM

## 2018-12-22 DIAGNOSIS — Z952 Presence of prosthetic heart valve: Secondary | ICD-10-CM

## 2018-12-22 LAB — BASIC METABOLIC PANEL
Anion gap: 5 (ref 5–15)
BUN: 14 mg/dL (ref 8–23)
CO2: 26 mmol/L (ref 22–32)
Calcium: 8.6 mg/dL — ABNORMAL LOW (ref 8.9–10.3)
Chloride: 104 mmol/L (ref 98–111)
Creatinine, Ser: 0.65 mg/dL (ref 0.44–1.00)
GFR calc Af Amer: >60 mL/min (ref >60)
GFR calc non Af Amer: >60 mL/min (ref >60)
Glucose, Bld: 131 mg/dL — ABNORMAL HIGH (ref 70–99)
Potassium: 4 mmol/L (ref 3.5–5.1)
Sodium: 135 mmol/L (ref 135–145)

## 2018-12-22 LAB — CBC
HCT: 35.5 % — ABNORMAL LOW (ref 36.0–46.0)
Hemoglobin: 12.1 g/dL (ref 12.0–15.0)
MCH: 31 pg (ref 26.0–34.0)
MCHC: 34.1 g/dL (ref 30.0–36.0)
MCV: 91 fL (ref 80.0–100.0)
Platelets: 124 10*3/uL — ABNORMAL LOW (ref 150–400)
RBC: 3.9 MIL/uL (ref 3.87–5.11)
RDW: 13.5 % (ref 11.5–15.5)
WBC: 11.9 10*3/uL — ABNORMAL HIGH (ref 4.0–10.5)
nRBC: 0 % (ref 0.0–0.2)

## 2018-12-22 LAB — ECHOCARDIOGRAM COMPLETE
Height: 65 in
Weight: 1844.81 oz

## 2018-12-22 LAB — MAGNESIUM: Magnesium: 1.9 mg/dL (ref 1.7–2.4)

## 2018-12-22 MED ORDER — ASPIRIN 81 MG PO CHEW: 81.0000 mg | CHEWABLE_TABLET | Freq: Every day | ORAL | Status: DC

## 2018-12-22 MED ORDER — CLOPIDOGREL BISULFATE 75 MG PO TABS: 75.0000 mg | ORAL_TABLET | Freq: Every day | ORAL | 3 refills | Status: DC

## 2018-12-22 MED FILL — Heparin Sodium (Porcine) Inj 1000 Unit/ML: INTRAMUSCULAR | Qty: 30 | Status: AC

## 2018-12-22 MED FILL — Potassium Chloride Inj 2 mEq/ML: INTRAVENOUS | Qty: 40 | Status: AC

## 2018-12-22 MED FILL — Magnesium Sulfate Inj 50%: INTRAMUSCULAR | Qty: 10 | Status: AC

## 2018-12-22 NOTE — Anesthesia Postprocedure Evaluation (Signed)
Anesthesia Post Note  Patient: [name]  Procedure(s) Performed: TRANSCATHETER AORTIC VALVE REPLACEMENT, RIGHT SUBCLAVIAN (N/A Chest) TRANSESOPHAGEAL ECHOCARDIOGRAM (TEE) (N/A )     Patient location during evaluation: Cath Lab Anesthesia Type: General Level of consciousness: awake and alert Pain management: pain level controlled Vital Signs Assessment: post-procedure vital signs reviewed and stable Respiratory status: spontaneous breathing, nonlabored ventilation, respiratory function stable and patient connected to nasal cannula oxygen Cardiovascular status: stable Postop Assessment: no apparent nausea or vomiting Anesthetic complications: no    Last Vitals:  Vitals:   12/22/18 0837 12/22/18 1127  BP: (!) 117/53 (!) 159/66  Pulse: 65 (!) 53  Resp: 16 19  Temp: 37.2 C 37.1 C  SpO2: 95% 94%    Last Pain:  Vitals:   12/22/18 1250  TempSrc:   PainSc: 1                  Lusia Greis

## 2018-12-22 NOTE — Progress Notes (Signed)
CARDIAC REHAB PHASE I   PRE:  Rate/Rhythm: 86 paced  BP:  Supine: 137/46  Sitting:   Standing:    SaO2: 95%RA  MODE:  Ambulation: 470 ft   POST:  Rate/Rhythm: 102 paced  BP:  Supine:   Sitting: 159/66  Standing:    SaO2: 96%RA 1100-1142 Pt walked 470 ft on RA with hand held asst. Gait steady. Tolerated well. Gave heart healthy diet and encouraged pt to watch sodium. Gave walking instructions for ex. Offered CRP 2 but pt declined.    Graylon Good, RN BSN  12/22/2018 11:40 AM

## 2018-12-22 NOTE — Progress Notes (Signed)
D/C instructions given to patient. Wound care and medications reviewed. All questions answered. IV's removed, clean and intact. Niece to escort pt home.  Clyde Canterbury, RN

## 2018-12-22 NOTE — Progress Notes (Signed)
1 Day Post-Op Procedure(s) (LRB): TRANSCATHETER AORTIC VALVE REPLACEMENT, RIGHT SUBCLAVIAN (N/A) TRANSESOPHAGEAL ECHOCARDIOGRAM (TEE) (N/A) Subjective: No complaints. Minimal incision discomfort.  Objective: Vital signs in last 24 hours: Temp:  [97.9 F (36.6 C)-98.9 F (37.2 C)] 98.5 F (36.9 C) (09/23 0258) Pulse Rate:  [0-119] 76 (09/23 0258) Cardiac Rhythm: Ventricular paced (09/23 0258) Resp:  [12-21] 19 (09/23 0406) BP: (94-138)/(29-80) 130/51 (09/23 0258) SpO2:  [90 %-96 %] 94 % (09/23 0258) Weight:  [52.3 kg] 52.3 kg (09/23 0406)  Hemodynamic parameters for last 24 hours:    Intake/Output from previous day: 09/22 0701 - 09/23 0700 In: 2589.1 [P.O.:960; I.V.:1187.1; IV Piggyback:442] Out: 30 [Blood:30] Intake/Output this shift: No intake/output data recorded.  General appearance: alert and cooperative Neurologic: intact Heart: regular rate and rhythm and no murmur Lungs: clear to auscultation bilaterally Extremities: extremities normal, no edema Wound: incision ok Left groin site ok  Lab Results: Recent Labs    12/21/18 1045 12/22/18 0245  WBC  --  11.9*  HGB 12.2 12.1  HCT 36.0 35.5*  PLT  --  124*   BMET:  Recent Labs    12/21/18 1045 12/22/18 0245  NA 139 135  K 4.0 4.0  CL 104 104  CO2  --  26  GLUCOSE 106* 131*  BUN 13 14  CREATININE 0.60 0.65  CALCIUM  --  8.6*    PT/INR: No results for input(s): LABPROT, INR in the last 72 hours. ABG    Component Value Date/Time   PHART 7.422 12/17/2018 1415   HCO3 24.3 12/17/2018 1415   TCO2 24 12/21/2018 1045   ACIDBASEDEF 1.0 11/24/2018 0848   O2SAT 97.7 12/17/2018 1415   CBG (last 3)  No results for input(s): GLUCAP in the last 72 hours.  Assessment/Plan: S/P Procedure(s) (LRB): TRANSCATHETER AORTIC VALVE REPLACEMENT, RIGHT SUBCLAVIAN (N/A) TRANSESOPHAGEAL ECHOCARDIOGRAM (TEE) (N/A)  POD 1 Hemodynamically stable in paced rhythm.  Plan 2D echo today.  Continue mobilization and plan  home today.  Resume Norvasc at home.  Plavix 75 and ASA 81 for valve   LOS: 1 day    Gaye Pollack 12/22/2018

## 2018-12-22 NOTE — Progress Notes (Signed)
  Echocardiogram 2D Echocardiogram has been performed.  [name] 12/22/2018, 11:28 AM

## 2018-12-22 NOTE — Discharge Summary (Signed)
Physician Discharge Summary        [address]       [place] [address]             [phone]    Patient ID: [name] MRN: [id number] DOB/AGE: 08/03/1935 83 y.o.  Admit date: 12/21/2018 Discharge date: 12/22/2018  Admission Diagnoses:  Patient Active Problem List   Diagnosis Date Noted  . Severe aortic valve stenosis   . Complete heart block ([place]) 10/20/2018  . Endometrial cancer ([place]) 06/12/2011   Discharge Diagnoses:   Patient Active Problem List   Diagnosis Date Noted  . 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 Severe aortic valve stenosis   . Complete heart block ([place]) 10/20/2018  . Endometrial cancer ([place]) 06/12/2011   Discharged Condition: good  History of Present Illness:  [name] is an 83 year old woman with history of hypertension, hypercholesterolemia, ongoing tobacco abuse, high-grade AV block status post permanent pacemaker placement on 10/20/2018, and aortic stenosis. She was admitted to [facility] on 10/20/2018 with near syncope with 2-1 AV block with intermittent complete heart block. An echocardiogram at that time showed severe aortic stenosis with a peak velocity of 4.2 m/s, mean gradient of 42 mmHg, and calculated aortic valve area of 0.48 cm. Left ventricular systolic function was normal with ejection fraction of greater than 65%. She underwent successful pacemaker placement via the left subclavian vein by Dr. Caryl Comes. She was subsequently evaluated by Dr. Angelena Form for consideration of aortic valve replacement and underwent cardiac catheterization showing mild nonobstructive coronary disease. The mean gradient was 39 mmHg with a peak to peak gradient of 47 mmHg and an aortic valve area of 0.48 cm.   She reports progressive exertional fatigue and tiredness. She denies any significant shortness of breath. She has had no dizziness since her pacemaker placement. She denies any peripheral edema. She has had no  orthopnea or PND. She denies any chest pressure pain. She has been very active mowing her grass and taking care of her yard. He does have a history of dental problems and had multiple loose teeth. She saw Dr. Enrique Sack and underwent extractions on 12/09/2018.  It was felt patient would be a candidate for TAVR procedure.  The risks and benefits of the procedure were explained to the patient and she was agreeable to proceed.  Hospital Course:   [name] presented to [facility] on 12/21/2018.  She underwent Transcatheter Aortic Valve Replacement via a subclavian approach.  She tolerated the procedure without difficulty and was taken to the progressive care unit in stable condition.  The patient remained hemodynamically stable overnight.  She is ambulating without difficulty.  She underwent post operative Echocardiogram which showed no significant AI or gradient  Her incision site shows no evidence of infection.  She is medically stable for discharge home today.  Significant Diagnostic Studies: cardiac graphics:   Echocardiogram:   Treatments: surgery:    Transcatheter Aortic Valve Replacement - Right Subclavian Approach             Edwards Sapien 3 Ultra THV (size 23 mm, model # 9750TFX, serial # [id number])  Discharge Exam: Blood pressure (!) 159/66, pulse (!) 53, temperature 98.8 F (37.1 C), temperature source Oral, resp. rate 19, height 5\' 5"  (1.651 m), weight 52.3 kg, SpO2 94 %.  General appearance: alert and cooperative Neurologic: intact Heart: regular rate and rhythm and no murmur Lungs: clear to auscultation bilaterally Extremities: extremities normal, no edema Wound: incision  ok Left groin site ok   Discharge Medications: Discharge Instructions    Discharge patient   Complete by: As directed    Discharge disposition: 01-Home or Self Care   Discharge patient date: 12/22/2018     Allergies as of 12/22/2018   No Known Allergies     Medication List    STOP taking  these medications   acetaminophen-codeine 120-12 MG/5ML solution     TAKE these medications   acetaminophen 500 MG tablet Commonly known as: TYLENOL Take 500 mg by mouth at bedtime.   ALPRAZolam 0.25 MG tablet Commonly known as: Xanax Take 1 tablet (0.25 mg total) by mouth 3 (three) times daily as needed for anxiety.   amLODipine 5 MG tablet Commonly known as: NORVASC Take 5 mg by mouth 2 (two) times daily.   aspirin 81 MG chewable tablet Chew 1 tablet (81 mg total) by mouth daily. Start taking on: December 23, 2018   carboxymethylcellulose 0.5 % Soln Commonly known as: REFRESH PLUS Place 1 drop into both eyes at bedtime.   chlorhexidine 0.12 % solution Commonly known as: PERIDEX Rinse with 15 mls twice daily for 30 seconds. Use after breakfast and at bedtime. Spit out excess. Do not swallow.   citalopram 40 MG tablet Commonly known as: CELEXA Take 20 mg by mouth daily at 2 PM.   clopidogrel 75 MG tablet Commonly known as: PLAVIX Take 1 tablet (75 mg total) by mouth daily with breakfast. Start taking on: December 23, 2018   Fish Oil 1200 MG Caps Take 1,200 mg by mouth daily at 2 PM.   hydrOXYzine 50 MG capsule Commonly known as: VISTARIL Take 1 capsule (50 mg total) by mouth 3 (three) times daily as needed for anxiety.   MAGNESIUM PO Take 2,000 mg by mouth at bedtime.   Melatonin 5 MG Tabs Take 5 mg by mouth at bedtime.   oxymetazoline 0.05 % nasal spray Commonly known as: AFRIN Place 1 spray into both nostrils 2 (two) times daily.   simvastatin 20 MG tablet Commonly known as: ZOCOR Take 20 mg by mouth daily at 6 PM.   vitamin B-12 500 MCG tablet Commonly known as: CYANOCOBALAMIN Take 500 mcg by mouth daily at 2 PM.   Vitamin D-3 125 MCG (5000 UT) Tabs Take 5,000 Units by mouth daily at 2 PM.      Follow-up Information    Gina Stanford, PA-C. Go on 12/29/2018.   Specialties: Cardiology, Radiology Why: @ 1:30pm, please arrive at least 10  minutes early. Contact information: [address] [name] [id number] [phone]           Signed: John Giovanni PA-C 12/22/2018, 1:09 PM

## 2018-12-23 ENCOUNTER — Telehealth: Payer: Self-pay | Admitting: Physician Assistant

## 2018-12-23 ENCOUNTER — Encounter (HOSPITAL_COMMUNITY): Payer: Self-pay | Admitting: Cardiovascular Disease

## 2018-12-23 NOTE — Telephone Encounter (Signed)
  [place] VALVE TEAM   Patient contacted regarding discharge from [facility] on 9/23  Patient understands to follow up with provider Nell Range on 9/30 at [place].  Patient understands discharge instructions? yes Patient understands medications and regimen? yes Patient understands to bring all medications to this visit? yes  Angelena Form PA-C  [facility]

## 2018-12-28 NOTE — Progress Notes (Addendum)
[facility]                                       Cardiology Office Note    Date:  12/29/2018   ID:  [name], DOB 28-Nov-1935, MRN [id number]  PCP:  Leonard Downing, MD  Cardiologist: Dr. Caryl Comes / Dr. Angelena Form & Dr. Cyndia Bent (TAVR)  CC: [name] s/p TAVR  History of Present Illness:  [name] is a 83 y.o. 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   She was admitted to [facility] on 10/20/2018 with near syncope with 2-1 AV block with intermittent complete heart block. An echocardiogram at that time showed severe aortic stenosis with a peak velocity of 4.2 m/s, mean gradient of 42 mmHg, and calculated aortic valve area of 0.48 cm. Left ventricular systolic function was normal with ejection fraction of greater than 65%. She underwent successful pacemaker placement via the left subclavian vein by Dr. Caryl Comes. She was subsequently evaluated by Dr. Angelena Form for consideration of aortic valve replacement and underwent cardiac catheterization showing mild nonobstructive coronary disease. The mean gradient was 39 mmHg with a peak to peak gradient of 47 mmHg and an aortic valve area of 0.48 cm.   The patient was evaluated by the multidisciplinary valve team and underwent successful TAVR with a 23 mm Edwards Sapien 3 Ultra THV via the subclavian approach on 12/21/18. Post operative echo showed EF 60-65%, normally functioning TAVR with mean gradient of 10 mm Hg and no PVL. She was discharged home on aspirin and plavix.   Today she presents to clinic for follow up. She has a pruritic, diffuse erythematous rash all over her trunk as well as pruritis in her scalp which is new since TAVR. Also has a tender lymph node in her neck. No CP or SOB. No LE edema, orthopnea or  PND. No dizziness or syncope. No blood in stool or urine. No palpitations. BP has also been elevated. Feels like she doesn't have the energy she thought she would have. She has been having debilitating anxiety. I had given her a short course of Xanax to get her through the TAVR process which was causing her panic attacks and GI upset and she requests more.    Past Medical History:  Diagnosis Date   Anxiety    Stable   ASCUS on Pap smear 11/15/09   Carotid artery disease (HCC)    1-39% RICA stenosis and [id number] LICA stenosis on pre TAVR dopplers    Depression    Endometrial polyp 11/15/2009   Endometrioid adenocarcinoma    H/O varicella    Hemorrhoids    History of measles, mumps, or rubella    Hypercholesterolemia    Hypertension    Menopausal symptoms 06/13/10   Ovarian cyst 11/15/09   PMB (postmenopausal bleeding) 11/15/09   Presence of permanent cardiac pacemaker 10/20/2018   Vaginal bleeding     Past Surgical History:  Procedure Laterality Date   BREAST SURGERY     Over twenty - five  years ago   CATARACT EXTRACTION W/ INTRAOCULAR LENS IMPLANT Bilateral    CERVICAL BIOPSY     EYE SURGERY     INSERT / REPLACE /  REMOVE PACEMAKER  10/20/2018   LAPAROSCOPIC ASSISTED VAGINAL HYSTERECTOMY  12/2009   BSO   MULTIPLE EXTRACTIONS WITH ALVEOLOPLASTY N/A 12/09/2018   Procedure: EXTRACTION OF TOOTH #'S [id number] AND 32 WITH ALVEOLOPLASTY AND BILATERAL MANDIBULAR TORI REDUCTIONS;  Surgeon: Lenn Cal, DDS;  Location: [place];  Service: Oral Surgery;  Laterality: N/A;   PACEMAKER IMPLANT N/A 10/20/2018   Procedure: PACEMAKER IMPLANT;  Surgeon: Deboraha Sprang, MD;  Location: [place] [facility];  Service: Cardiovascular;  Laterality: N/A;   RIGHT/LEFT HEART CATH AND CORONARY ANGIOGRAPHY N/A 11/24/2018   Procedure: RIGHT/LEFT HEART CATH AND CORONARY ANGIOGRAPHY;  Surgeon: Burnell Blanks, MD;  Location: [place] [facility];  Service: Cardiovascular;  Laterality:  N/A;   TEE WITHOUT CARDIOVERSION N/A 12/21/2018   Procedure: TRANSESOPHAGEAL ECHOCARDIOGRAM (TEE);  Surgeon: Burnell Blanks, MD;  Location: [place];  Service: Open Heart Surgery;  Laterality: N/A;    Current Medications: Outpatient Medications Prior to Visit  Medication Sig Dispense Refill   acetaminophen (TYLENOL) 500 MG tablet Take 500 mg by mouth at bedtime.     ALPRAZolam (XANAX) 0.25 MG tablet Take 1 tablet (0.25 mg total) by mouth 3 (three) times daily as needed for anxiety. 10 tablet 0   aspirin 81 MG chewable tablet Chew 1 tablet (81 mg total) by mouth daily.     carboxymethylcellulose (REFRESH PLUS) 0.5 % SOLN Place 1 drop into both eyes at bedtime.     chlorhexidine (PERIDEX) 0.12 % solution Rinse with 15 mls twice daily for 30 seconds. Use after breakfast and at bedtime. Spit out excess. Do not swallow. 480 mL prn   Cholecalciferol (VITAMIN D-3) 125 MCG (5000 UT) TABS Take 5,000 Units by mouth daily at 2 PM.     citalopram (CELEXA) 40 MG tablet Take 20 mg by mouth daily at 2 PM.      hydrOXYzine (VISTARIL) 50 MG capsule Take 1 capsule (50 mg total) by mouth 3 (three) times daily as needed for anxiety.     MAGNESIUM PO Take 2,000 mg by mouth at bedtime.      Melatonin 5 MG TABS Take 5 mg by mouth at bedtime.     Omega-3 Fatty Acids (FISH OIL) 1200 MG CAPS Take 1,200 mg by mouth daily at 2 PM.     oxymetazoline (AFRIN) 0.05 % nasal spray Place 1 spray into both nostrils 2 (two) times daily.     simvastatin (ZOCOR) 20 MG tablet Take 20 mg by mouth daily at 6 PM.      vitamin B-12 (CYANOCOBALAMIN) 500 MCG tablet Take 500 mcg by mouth daily at 2 PM.     amLODipine (NORVASC) 5 MG tablet Take 5 mg by mouth 2 (two) times daily.      clopidogrel (PLAVIX) 75 MG tablet Take 1 tablet (75 mg total) by mouth daily with breakfast. 30 tablet 3   No facility-administered medications prior to visit.      Allergies:   Patient has no known allergies.   Social History    Socioeconomic History   Marital status: Legally Separated    Spouse name: Not on file   Number of children: Not on file   Years of education: Not on file   Highest education level: Not on file  Occupational History   Occupation: Retired-sales rep  Social Needs   Financial resource strain: Not on file   Food insecurity    Worry: Not on file    Inability: Not on file   Transportation  needs    Medical: Not on file    Non-medical: Not on file  Tobacco Use   Smoking status: Current Every Day Smoker    Packs/day: 0.50    Years: 55.00    Pack years: 27.50    Types: Cigarettes   Smokeless tobacco: Never Used   Tobacco comment: 3 cig/day  Substance and Sexual Activity   Alcohol use: No   Drug use: Not Currently   Sexual activity: Not on file  Lifestyle   Physical activity    Days per week: Not on file    Minutes per session: Not on file   Stress: Not on file  Relationships   Social connections    Talks on phone: Not on file    Gets together: Not on file    Attends religious service: Not on file    Active member of club or organization: Not on file    Attends meetings of clubs or organizations: Not on file    Relationship status: Not on file  Other Topics Concern   Not on file  Social History Narrative   Not on file     Family History:  The patient's family history includes Heart disease in her brother; Heart failure in her mother; Lung cancer in her brother; Parkinson's disease in her father.     ROS:   Please see the history of present illness.    ROS All other systems reviewed and are negative.   PHYSICAL EXAM:   VS:  BP (!) 150/70    Pulse 74    Ht 5\' 5"  (1.651 m)    Wt 110 lb (49.9 kg)    SpO2 99%    BMI 18.30 kg/m    GEN: Well nourished, well developed, in no acute distress HEENT: normal Neck: no JVD or masses Cardiac: RRR; soft murmur. No rubs, or gallops,no edema  Respiratory:  clear to auscultation bilaterally, normal work of  breathing GI: soft, nontender, nondistended, + BS MS: no deformity or atrophy Skin: warm and dry, no rash.  Groin site clear without hematoma or ecchymosis. Subclavian site healing well. Diffuse erythematous rash all over her trunk a Neuro:  Alert and Oriented x 3, Strength and sensation are intact Psych: euthymic mood, full affect   Wt Readings from Last 3 Encounters:  12/29/18 110 lb (49.9 kg)  12/22/18 115 lb 4.8 oz (52.3 kg)  12/17/18 110 lb 6.4 oz (50.1 kg)      Studies/Labs Reviewed:   EKG:  EKG is ordered today.  The ekg ordered today demonstrates  A sensed V paced. HR 74  Recent Labs: 10/20/2018: TSH 2.350 12/17/2018: ALT <5; B Natriuretic Peptide 142.7 12/22/2018: BUN 14; Creatinine, Ser 0.65; Hemoglobin 12.1; Magnesium 1.9; Platelets 124; Potassium 4.0; Sodium 135   Lipid Panel No results found for: CHOL, TRIG, HDL, CHOLHDL, VLDL, LDLCALC, LDLDIRECT  Additional studies/ records that were reviewed today include:  TAVR OPERATIVE NOTE   Date of Procedure:                12/21/2018  Preoperative Diagnosis:      Severe Aortic Stenosis   Postoperative Diagnosis:    Same   Procedure:        Transcatheter Aortic Valve Replacement - Right Subclavian Approach             Edwards Sapien 3 Ultra THV (size 23 mm, model # 9750TFX, serial # [id number])  Co-Surgeons:                        Gaye Pollack, MD and Lauree Chandler, MD    Anesthesiologist:                  Caryl Comes, MD  Echocardiographer:              Bertrum Sol, MD  Pre-operative Echo Findings: ? Severe aortic stenosis ? Normal left ventricular systolic function  Post-operative Echo Findings: ? No paravalvular leak ? Normal left ventricular systolic function  ____________________   Echo 12/22/2018 IMPRESSIONS  1. Left ventricular ejection fraction, by visual estimation, is 60 to 65%. The left ventricle has normal function. Normal left ventricular size. There is no left  ventricular hypertrophy.  2. Left ventricular diastolic Doppler parameters are consistent with impaired relaxation pattern of LV diastolic filling.  3. Global right ventricle has normal systolic function.The right ventricular size is normal. No increase in right ventricular wall thickness.  4. Left atrial size was mildly dilated.  5. Right atrial size was normal.  6. Mild mitral annular calcification.  7. The mitral valve is normal in structure. No evidence of mitral valve regurgitation. No evidence of mitral stenosis.  8. The tricuspid valve is normal in structure. Tricuspid valve regurgitation is mild.  9. Aortic valve area, by VTI measures 1.25 cm. 10. Aortic valve mean gradient measures 10.0 mmHg. 11. Aortic valve peak gradient measures 20.1 mmHg. 12. The aortic valve is normal in structure. Aortic valve regurgitation was not visualized by color flow Doppler. Structurally normal aortic valve, with no evidence of sclerosis or stenosis. 13. Edwards Sapien 3 Ultra THV (size 23 mm, model # 9750TFX, serial # [id number]). 14. The pulmonic valve was normal in structure. Pulmonic valve regurgitation is not visualized by color flow Doppler. 15. Normal pulmonary artery systolic pressure. 16. A pacer wire is visualized. 17. The inferior vena cava is normal in size with greater than 50% respiratory variability, suggesting right atrial pressure of 3 mmHg.   ASSESSMENT & PLAN:   Severe AS s/p TAVR: doing well. Groin and subclavian sites healing well. SBE prophylaxis discussed; I have RX'd amoxicillin. She wants to get dental implants or dentures. She was started on aspirin and plavix after TAVR. She now has a pruritic, diffuse erythematous rash all over her trunk. I think this is most likely from plavix. I have asked her to stop this and continue on aspirin alone. I discussed case with Dr. Angelena Form and he recommended that she continue aspirin alone vs adding another antiplatelet agent.   HTN: BP is  elevated today and has been at home. Continue amlodipine 10mg  daily. Will add Losartan 25mg  daily now and get a BMET in 2 weeks.  Carotid artery disease: pre TAVR dopplers showed A999333 LICA stenosis. Plan to get repeat dopplers in 10/2019.  S/p PPM: followed by Dr. Caryl Comes   Anxiety: I gave her a short term Rx for Xanax to get her through the TAVR process which was causing her panic attacks and GI upset and she requests more today. I told her that from a cardiology standpoint, I am okay with her taking this medication but I cannot Rx this for her long term. She requests that I send Dr. Welton Flakes my note letting him know she is cleared from our perspective.   Enlarged lymph node?: she has what appears to be a tender, enlarged lymph node in her neck. I have  asked her to follow up with Dr. Welton Flakes about his.    Medication Adjustments/Labs and Tests Ordered: Current medicines are reviewed at length with the patient today.  Concerns regarding medicines are outlined above.  Medication changes, Labs and Tests ordered today are listed in the Patient Instructions below. Patient Instructions  Medication Instructions:  1) STOP PLAVIX 2) START LOSARTAN 25 mg daily 3) Your provider discussed the importance of taking an antibiotic prior to all dental visits to prevent damage to the heart valves from infection. You were given a prescription for AMOXIL 2,000mg  to take one hour prior to any dental appointment.   Labwork: Your provider recommends that you return for lab work in [place].  Follow-Up: Please keep your appointments on October 22 for your echocardiogram and visit with Nell Range.    Signed, Angelena Form, PA-C  12/29/2018 2:31 PM    [facility] [place], [place], [place]  [address] Phone: [phone]; Fax: [phone]

## 2018-12-29 ENCOUNTER — Other Ambulatory Visit: Payer: Self-pay

## 2018-12-29 ENCOUNTER — Encounter: Payer: Self-pay | Admitting: Physician Assistant

## 2018-12-29 ENCOUNTER — Ambulatory Visit: Payer: Medicare HMO | Admitting: Physician Assistant

## 2018-12-29 VITALS — BP 150/70 | HR 74 | Ht 65.0 in | Wt 110.0 lb

## 2018-12-29 DIAGNOSIS — Z950 Presence of cardiac pacemaker: Secondary | ICD-10-CM

## 2018-12-29 DIAGNOSIS — I10 Essential (primary) hypertension: Secondary | ICD-10-CM | POA: Diagnosis not present

## 2018-12-29 DIAGNOSIS — I739 Peripheral vascular disease, unspecified: Secondary | ICD-10-CM

## 2018-12-29 DIAGNOSIS — Z952 Presence of prosthetic heart valve: Secondary | ICD-10-CM | POA: Diagnosis not present

## 2018-12-29 DIAGNOSIS — I779 Disorder of arteries and arterioles, unspecified: Secondary | ICD-10-CM

## 2018-12-29 MED ORDER — LOSARTAN POTASSIUM 25 MG PO TABS: 25.0000 mg | ORAL_TABLET | Freq: Every day | ORAL | 3 refills | Status: DC

## 2018-12-29 MED ORDER — AMLODIPINE BESYLATE 10 MG PO TABS: 10.0000 mg | ORAL_TABLET | Freq: Every day | ORAL | 3 refills | Status: DC

## 2018-12-29 MED ORDER — AMOXICILLIN 500 MG PO TABS: ORAL_TABLET | ORAL | 8 refills | Status: DC

## 2018-12-29 NOTE — Patient Instructions (Addendum)
Medication Instructions:  1) STOP PLAVIX 2) START LOSARTAN 25 mg daily 3) Your provider discussed the importance of taking an antibiotic prior to all dental visits to prevent damage to the heart valves from infection. You were given a prescription for AMOXIL 2,000mg  to take one hour prior to any dental appointment.   Labwork: Your provider recommends that you return for lab work in [place].  Follow-Up: Please keep your appointments on October 22 for your echocardiogram and visit with Nell Range.

## 2018-12-30 ENCOUNTER — Other Ambulatory Visit: Payer: Self-pay | Admitting: Physician Assistant

## 2019-01-07 ENCOUNTER — Ambulatory Visit: Payer: Medicare HMO | Admitting: Internal Medicine

## 2019-01-11 ENCOUNTER — Encounter (INDEPENDENT_AMBULATORY_CARE_PROVIDER_SITE_OTHER): Payer: Self-pay

## 2019-01-11 ENCOUNTER — Other Ambulatory Visit: Payer: Medicare HMO

## 2019-01-11 ENCOUNTER — Other Ambulatory Visit: Payer: Self-pay

## 2019-01-11 DIAGNOSIS — Z952 Presence of prosthetic heart valve: Secondary | ICD-10-CM

## 2019-01-11 DIAGNOSIS — I10 Essential (primary) hypertension: Secondary | ICD-10-CM | POA: Diagnosis not present

## 2019-01-11 LAB — BASIC METABOLIC PANEL
BUN/Creatinine Ratio: 18 (ref 12–28)
BUN: 13 mg/dL (ref 8–27)
CO2: 24 mmol/L (ref 20–29)
Calcium: 9.3 mg/dL (ref 8.7–10.3)
Chloride: 102 mmol/L (ref 96–106)
Creatinine, Ser: 0.74 mg/dL (ref 0.57–1.00)
GFR calc Af Amer: 88 mL/min/{1.73_m2} (ref >59)
GFR calc non Af Amer: 76 mL/min/{1.73_m2} (ref >59)
Glucose: 79 mg/dL (ref 65–99)
Potassium: 4.4 mmol/L (ref 3.5–5.2)
Sodium: 139 mmol/L (ref 134–144)

## 2019-01-18 ENCOUNTER — Other Ambulatory Visit (HOSPITAL_COMMUNITY): Payer: Medicare HMO | Admitting: Dentistry

## 2019-01-18 DIAGNOSIS — Z23 Encounter for immunization: Secondary | ICD-10-CM | POA: Diagnosis not present

## 2019-01-19 ENCOUNTER — Ambulatory Visit (HOSPITAL_COMMUNITY): Payer: Self-pay | Admitting: Dentistry

## 2019-01-19 ENCOUNTER — Other Ambulatory Visit: Payer: Self-pay

## 2019-01-19 ENCOUNTER — Encounter (HOSPITAL_COMMUNITY): Payer: Self-pay | Admitting: Dentistry

## 2019-01-19 VITALS — BP 143/55 | HR 71 | Temp 97.9°F

## 2019-01-19 DIAGNOSIS — Z952 Presence of prosthetic heart valve: Secondary | ICD-10-CM

## 2019-01-19 DIAGNOSIS — T148XXD Other injury of unspecified body region, subsequent encounter: Secondary | ICD-10-CM

## 2019-01-19 DIAGNOSIS — K0820 Unspecified atrophy of edentulous alveolar ridge: Secondary | ICD-10-CM

## 2019-01-19 DIAGNOSIS — K08109 Complete loss of teeth, unspecified cause, unspecified class: Secondary | ICD-10-CM

## 2019-01-19 NOTE — Progress Notes (Signed)
PROGRESS NOTE:  01/19/2019 TAHIA MACNEAL [id number]  COVID 19 SCREENING: The patient does not symptoms concerning for COVID-19 infection (Including fever, chills, cough, or new SHORTNESS OF BREATH).    VITALS: BP (!) 143/55 (BP Location: Left Arm)   Pulse 71   Temp 97.9 F (36.6 C)   LABS:  Lab Results  Component Value Date   WBC 11.9 (H) 12/22/2018   HGB 12.1 12/22/2018   HCT 35.5 (L) 12/22/2018   MCV 91.0 12/22/2018   PLT 124 (L) 12/22/2018   BMET    Component Value Date/Time   NA 139 01/11/2019 1014   K 4.4 01/11/2019 1014   CL 102 01/11/2019 1014   CO2 24 01/11/2019 1014   GLUCOSE 79 01/11/2019 1014   GLUCOSE 131 (H) 12/22/2018 0245   BUN 13 01/11/2019 1014   CREATININE 0.74 01/11/2019 1014   CALCIUM 9.3 01/11/2019 1014   GFRNONAA 76 01/11/2019 1014   GFRAA 88 01/11/2019 1014    Lab Results  Component Value Date   INR 1.1 12/17/2018   No results found for: PTT   MANUELLA SAHAGIAN is status post extraction of remaining teeth with alveoloplasty and bilateral mandibular lingual tori reductions in the operating room on 12/09/2018.  Patient now presents for RE-evaluation of healing.  SUBJECTIVE: Patient denies having any problems from the dental extraction sites. Patient is using the chlorhexidine rinse "but not all the time".  Patient may be interested in lower implants by report.  EXAM: There is no sign of infection.  Patient is healing in well with generalized primary closure. The lower right anterior is healing in be secondary intention. There is still some soft tissue defect associated with healing in this area, but no exposed bone.  Patient is completely edentulous.  There is atrophy of the edentulous alveolar ridges.  ASSESSMENT: Post operative course is consistent with dental procedures performed in the operating room with general anesthesia. Loss of teeth due to extraction Delayed healing involving the lower right anterior surgical site. Patient is now  completely edentulous There is atrophy of the edentulous alveolar ridges.  PLAN: 1.  Patient is to continue chlorhexidine rinses twice daily as instructed after breakfast and at bedtime in a  swish and spit manner.   2.  Continue salt water rinses in between the chlorhexidine rinses as needed.   3.  Advance diet as tolerated but still keep a soft diet only.  Avoid trauma to the lower right anterior segment. 4.  Patient is to follow-up with dental medicine in 4 to 5 weeks for reevaluation of healing. 5.  Discussed potential cost of implant therapy.  Will assist in referral to oral surgeon and prosthodontist if patient so desires upon reevaluation in 1 month.   6.  Provided copy of the dental medicine consult and operative report indicating the medical necessity of dental treatment prior to the aortic valve replacement.  Patient will also discuss with Theodosia Quay with the TAVR team for letter from Dr. Cyndia Bent or Dr. Angelena Form as indicated to assist in possible insurance reimbursement for the dental procedures.   Lenn Cal, DDS

## 2019-01-19 NOTE — Patient Instructions (Signed)
COVID-19 Education: The signs and symptoms of COVID-19 were discussed with the patient and how to seek care for testing (follow up with PCP or arrange E-visit).   The importance of social distancing was discussed today.  PLAN: 1.  Patient is to continue chlorhexidine rinses twice daily as instructed after breakfast and at bedtime in a  swish and spit manner.   2.  Continue salt water rinses in between the chlorhexidine rinses as needed.   3.  Advance diet as tolerated but still keep a soft diet only.  Avoid trauma to the lower right anterior segment. 4.  Patient is to follow-up with dental medicine in 4 to 5 weeks for reevaluation of healing. 5.  Discussed potential cost of implant therapy.  Will assist in referral to oral surgeon and prosthodontist if patient so desires upon reevaluation in 1 month.   6.  Provided copy of the dental medicine consult and operative report indicating the medical necessity of dental treatment prior to the aortic valve replacement.  Patient will also discuss with Theodosia Quay with the TAVR team for letter from Dr. Cyndia Bent or Dr. Angelena Form as indicated to assist in possible insurance reimbursement for the dental procedures.   Lenn Cal, DDS

## 2019-01-20 ENCOUNTER — Ambulatory Visit (HOSPITAL_COMMUNITY): Payer: Medicare HMO | Attending: Cardiology

## 2019-01-20 ENCOUNTER — Encounter: Payer: Self-pay | Admitting: Physician Assistant

## 2019-01-20 ENCOUNTER — Ambulatory Visit: Payer: Medicare HMO | Admitting: Physician Assistant

## 2019-01-20 ENCOUNTER — Ambulatory Visit (INDEPENDENT_AMBULATORY_CARE_PROVIDER_SITE_OTHER): Payer: Medicare HMO | Admitting: *Deleted

## 2019-01-20 VITALS — BP 128/70 | HR 70 | Ht 65.0 in | Wt 109.0 lb

## 2019-01-20 DIAGNOSIS — I779 Disorder of arteries and arterioles, unspecified: Secondary | ICD-10-CM

## 2019-01-20 DIAGNOSIS — Z950 Presence of cardiac pacemaker: Secondary | ICD-10-CM | POA: Diagnosis not present

## 2019-01-20 DIAGNOSIS — F419 Anxiety disorder, unspecified: Secondary | ICD-10-CM

## 2019-01-20 DIAGNOSIS — I442 Atrioventricular block, complete: Secondary | ICD-10-CM

## 2019-01-20 DIAGNOSIS — Z952 Presence of prosthetic heart valve: Secondary | ICD-10-CM | POA: Insufficient documentation

## 2019-01-20 DIAGNOSIS — I10 Essential (primary) hypertension: Secondary | ICD-10-CM | POA: Diagnosis not present

## 2019-01-20 DIAGNOSIS — R69 Illness, unspecified: Secondary | ICD-10-CM | POA: Diagnosis not present

## 2019-01-20 NOTE — Progress Notes (Signed)
[facility] [place]                                       Cardiology Office Note    Date:  01/20/2019   ID:  [name], DOB 1935/09/01, MRN [id number]  PCP:  Leonard Downing, MD  Cardiologist: Dr. Caryl Comes / Dr. Angelena Form & Dr. Cyndia Bent (TAVR)  CC: 1 month s/p TAVR  History of Present Illness:  [name] is a 83 y.o. 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   She was admitted to [facility] on 10/20/2018 with near syncope with 2-1 AV block with intermittent complete heart block. An echocardiogram at that time showed severe aortic stenosis with a peak velocity of 4.2 m/s, mean gradient of 42 mmHg, and calculated aortic valve area of 0.48 cm. Left ventricular systolic function was normal with ejection fraction of greater than 65%. She underwent successful pacemaker placement via the left subclavian vein by Dr. Caryl Comes. She was subsequently evaluated by Dr. Angelena Form for consideration of aortic valve replacement and underwent cardiac catheterization showing mild nonobstructive coronary disease. The mean gradient was 39 mmHg with a peak to peak gradient of 47 mmHg and an aortic valve area of 0.48 cm.  The patient was evaluated by the multidisciplinary valve team and underwent successful TAVR with a 23 mm Edwards Sapien 3 Ultra THV via the subclavian approach on 12/21/18. Post operative echo showed EF 60-65%, normally functioning TAVR with mean gradient of 10 mm Hg and no PVL. She was discharged home on aspirin and plavix.   At one week follow up she had a pruritic, diffuse erythematous rash all over her trunk as well as pruritis in her scalp which is new since TAVR. This was felt to be related to her plavix and this was discontinued. She was continued on only an  aspirin. Additionally, her BP was elevated and Losartan 25mg  daily was added. BMET 10/13 showed stable renal function and potassium.   Today she presents to clinic for follow up. She is doing really well. Subclavian site healing well. She wants to know if she is cleared to resume her normal activities. No CP. She has slight dyspnea but overall improved since TAVR. No LE edema, orthopnea or PND. No dizziness or syncope. No blood in stool or urine. No palpitations.   Past Medical History:  Diagnosis Date  . Anxiety    Stable  . ASCUS on Pap smear 11/15/09  . Carotid artery disease (HCC)    1-39% RICA stenosis and A999333 LICA stenosis on pre TAVR dopplers   . Depression   . Endometrial polyp 11/15/2009  . Endometrioid adenocarcinoma   . H/O varicella   . Hemorrhoids   . History of measles, mumps, or rubella   . Hypercholesterolemia   . Hypertension   . Menopausal symptoms 06/13/10  . Ovarian cyst 11/15/09  . PMB (postmenopausal bleeding) 11/15/09  . Presence of permanent cardiac pacemaker 10/20/2018  . Vaginal bleeding     Past Surgical History:  Procedure Laterality Date  . BREAST SURGERY     Over twenty - five  years ago  . CATARACT EXTRACTION W/ INTRAOCULAR LENS IMPLANT Bilateral   . CERVICAL BIOPSY    .  EYE SURGERY    . INSERT / REPLACE / REMOVE PACEMAKER  10/20/2018  . LAPAROSCOPIC ASSISTED VAGINAL HYSTERECTOMY  12/2009   BSO  . MULTIPLE EXTRACTIONS WITH ALVEOLOPLASTY N/A 12/09/2018   Procedure: EXTRACTION OF TOOTH #'S 6,21-29 AND 32 WITH ALVEOLOPLASTY AND BILATERAL MANDIBULAR TORI REDUCTIONS;  Surgeon: Lenn Cal, DDS;  Location: [place];  Service: Oral Surgery;  Laterality: N/A;  . PACEMAKER IMPLANT N/A 10/20/2018   Procedure: PACEMAKER IMPLANT;  Surgeon: Deboraha Sprang, MD;  Location: [place] [facility];  Service: Cardiovascular;  Laterality: N/A;  . RIGHT/LEFT HEART CATH AND CORONARY ANGIOGRAPHY N/A 11/24/2018   Procedure: RIGHT/LEFT HEART CATH AND CORONARY ANGIOGRAPHY;   Surgeon: Burnell Blanks, MD;  Location: [place] [facility];  Service: Cardiovascular;  Laterality: N/A;  . TEE WITHOUT CARDIOVERSION N/A 12/21/2018   Procedure: TRANSESOPHAGEAL ECHOCARDIOGRAM (TEE);  Surgeon: Burnell Blanks, MD;  Location: [place];  Service: Open Heart Surgery;  Laterality: N/A;    Current Medications: Outpatient Medications Prior to Visit  Medication Sig Dispense Refill  . acetaminophen (TYLENOL) 500 MG tablet Take 500 mg by mouth at bedtime.    . ALPRAZolam (XANAX) 0.25 MG tablet Take 1 tablet (0.25 mg total) by mouth 3 (three) times daily as needed for anxiety. 10 tablet 0  . amLODipine (NORVASC) 10 MG tablet Take 1 tablet (10 mg total) by mouth daily. 90 tablet 3  . amoxicillin (AMOXIL) 500 MG tablet Take 4 capsules (2,000 mg) one hour prior to all dental visits. 12 tablet 8  . aspirin 81 MG chewable tablet Chew 1 tablet (81 mg total) by mouth daily.    . carboxymethylcellulose (REFRESH PLUS) 0.5 % SOLN Place 1 drop into both eyes daily as needed (dry eyes).     . chlorhexidine (PERIDEX) 0.12 % solution Rinse with 15 mls twice daily for 30 seconds. Use after breakfast and at bedtime. Spit out excess. Do not swallow. 480 mL prn  . Cholecalciferol (VITAMIN D-3) 125 MCG (5000 UT) TABS Take 5,000 Units by mouth daily at 2 PM.    . citalopram (CELEXA) 40 MG tablet Take 20 mg by mouth daily at 2 PM.     . hydrOXYzine (VISTARIL) 50 MG capsule Take 1 capsule (50 mg total) by mouth 3 (three) times daily as needed for anxiety.    Marland Kitchen losartan (COZAAR) 25 MG tablet Take 1 tablet (25 mg total) by mouth daily. 90 tablet 3  . MAGNESIUM PO Take 2,000 mg by mouth as needed (sleep).     . Melatonin 5 MG TABS Take 5 mg by mouth at bedtime.    . Omega-3 Fatty Acids (FISH OIL) 1200 MG CAPS Take 1,200 mg by mouth daily at 2 PM.    . simvastatin (ZOCOR) 20 MG tablet Take 20 mg by mouth daily at 6 PM.     . vitamin B-12 (CYANOCOBALAMIN) 500 MCG tablet Take 500 mcg by mouth daily at  2 PM.    . oxymetazoline (AFRIN) 0.05 % nasal spray Place 1 spray into both nostrils 2 (two) times daily.     No facility-administered medications prior to visit.      Allergies:   Patient has no known allergies.   Social History   Socioeconomic History  . Marital status: Legally Separated    Spouse name: Not on file  . Number of children: Not on file  . Years of education: Not on file  . Highest education level: Not on file  Occupational History  .  Occupation: Retired-sales rep  Social Needs  . Financial resource strain: Not on file  . Food insecurity    Worry: Not on file    Inability: Not on file  . Transportation needs    Medical: Not on file    Non-medical: Not on file  Tobacco Use  . Smoking status: Current Every Day Smoker    Packs/day: 0.50    Years: 55.00    Pack years: 27.50    Types: Cigarettes  . Smokeless tobacco: Never Used  . Tobacco comment: 3 cig/day  Substance and Sexual Activity  . Alcohol use: No  . Drug use: Not Currently  . Sexual activity: Not on file  Lifestyle  . Physical activity    Days per week: Not on file    Minutes per session: Not on file  . Stress: Not on file  Relationships  . Social Herbalist on phone: Not on file    Gets together: Not on file    Attends religious service: Not on file    Active member of club or organization: Not on file    Attends meetings of clubs or organizations: Not on file    Relationship status: Not on file  Other Topics Concern  . Not on file  Social History Narrative  . Not on file     Family History:  The patient's family history includes Heart disease in her brother; Heart failure in her mother; Lung cancer in her brother; Parkinson's disease in her father.     ROS:   Please see the history of present illness.    ROS All other systems reviewed and are negative.   PHYSICAL EXAM:   VS:  BP 128/70   Pulse 70   Ht 5\' 5"  (1.651 m)   Wt 109 lb (49.4 kg)   SpO2 98%   BMI 18.14  kg/m    GEN: Well nourished, well developed, in no acute distress HEENT: normal Neck: no JVD or masses Cardiac: RRR; soft murmur. No rubs, or gallops,no edema  Respiratory:  clear to auscultation bilaterally, normal work of breathing GI: soft, nontender, nondistended, + BS MS: no deformity or atrophy Skin: warm and dry, no rash. Subclavian site healing well.  Neuro:  Alert and Oriented x 3, Strength and sensation are intact Psych: euthymic mood, full affect   Wt Readings from Last 3 Encounters:  01/20/19 109 lb (49.4 kg)  12/29/18 110 lb (49.9 kg)  12/22/18 115 lb 4.8 oz (52.3 kg)      Studies/Labs Reviewed:   EKG:  EKG is NOT ordered today.    Recent Labs: 10/20/2018: TSH 2.350 12/17/2018: ALT <5; B Natriuretic Peptide 142.7 12/22/2018: Hemoglobin 12.1; Magnesium 1.9; Platelets 124 01/11/2019: BUN 13; Creatinine, Ser 0.74; Potassium 4.4; Sodium 139   Lipid Panel No results found for: CHOL, TRIG, HDL, CHOLHDL, VLDL, LDLCALC, LDLDIRECT  Additional studies/ records that were reviewed today include:  TAVR OPERATIVE NOTE   Date of Procedure:                12/21/2018  Preoperative Diagnosis:      Severe Aortic Stenosis   Postoperative Diagnosis:    Same   Procedure:        Transcatheter Aortic Valve Replacement - Right Subclavian Approach             Edwards Sapien 3 Ultra THV (size 23 mm, model # 9750TFX, serial # [id number])  Co-Surgeons:                        Gaye Pollack, MD and Lauree Chandler, MD    Anesthesiologist:                  Caryl Comes, MD  Echocardiographer:              Bertrum Sol, MD  Pre-operative Echo Findings: ? Severe aortic stenosis ? Normal left ventricular systolic function  Post-operative Echo Findings: ? No paravalvular leak ? Normal left ventricular systolic function  ____________________   Echo 12/22/2018 IMPRESSIONS  1. Left ventricular ejection fraction, by visual estimation, is 60 to 65%. The  left ventricle has normal function. Normal left ventricular size. There is no left ventricular hypertrophy.  2. Left ventricular diastolic Doppler parameters are consistent with impaired relaxation pattern of LV diastolic filling.  3. Global right ventricle has normal systolic function.The right ventricular size is normal. No increase in right ventricular wall thickness.  4. Left atrial size was mildly dilated.  5. Right atrial size was normal.  6. Mild mitral annular calcification.  7. The mitral valve is normal in structure. No evidence of mitral valve regurgitation. No evidence of mitral stenosis.  8. The tricuspid valve is normal in structure. Tricuspid valve regurgitation is mild.  9. Aortic valve area, by VTI measures 1.25 cm. 10. Aortic valve mean gradient measures 10.0 mmHg. 11. Aortic valve peak gradient measures 20.1 mmHg. 12. The aortic valve is normal in structure. Aortic valve regurgitation was not visualized by color flow Doppler. Structurally normal aortic valve, with no evidence of sclerosis or stenosis. 13. Edwards Sapien 3 Ultra THV (size 23 mm, model # 9750TFX, serial # [id number]). 14. The pulmonic valve was normal in structure. Pulmonic valve regurgitation is not visualized by color flow Doppler. 15. Normal pulmonary artery systolic pressure. 16. A pacer wire is visualized. 17. The inferior vena cava is normal in size with greater than 50% respiratory variability, suggesting right atrial pressure of 3 mmHg.   ____________________   Echo 01/20/19 IMPRESSIONS  1. Left ventricular ejection fraction, by visual estimation, is 60 to 65%. The left ventricle has normal function. Normal left ventricular size. There is no left ventricular hypertrophy.  2. Left ventricular diastolic Doppler parameters are consistent with impaired relaxation pattern of LV diastolic filling.  3. Global right ventricle has normal systolic function.The right ventricular size is normal. No increase in  right ventricular wall thickness.  4. Left atrial size was severely dilated  5. Right atrial size was normal.  6. Moderate mitral annular calcification.  7. The mitral valve is normal in structure. Trace mitral valve regurgitation. No evidence of mitral stenosis.  8. The tricuspid valve is normal in structure. Tricuspid valve regurgitation is mild.  9. Aortic valve regurgitation was not visualized by color flow Doppler. Structurally normal aortic valve, with no evidence of sclerosis or stenosis. 10. Peak transaortic velocity 1.29m/s, mean gradient 62mmHg. 11. The pulmonic valve was normal in structure. Pulmonic valve regurgitation is not visualized by color flow Doppler. 12. Mildly elevated pulmonary artery systolic pressure. 13. The tricuspid regurgitant velocity is 2.48 m/s, and with an assumed right atrial pressure of 8 mmHg, the estimated right ventricular systolic pressure is mildly elevated at 32.6 mmHg. 14. A pacer wire is visualized. 15. The inferior vena cava is dilated in size with >50% respiratory variability, suggesting right atrial pressure of 8 mmHg.  ASSESSMENT & PLAN:  Severe AS s/p TAVR: echo today shows EF 60-65%, normally functioning TAVR with mean gradient 6.3 mm Hg and no PVL. She has NYHA class II symptoms. SBE prophylaxis discussed; she has amoxicillin. Continue on aspirin 81mg  daily alone. She had a pruritic rash on Plavix.  HTN: BP better controlled on Losartan 25mg  daily. Follow up labs were okay.   Carotid artery disease: pre TAVR dopplers showed A999333 LICA stenosis. Plan to get repeat dopplers in 10/2019.   S/p PPM: followed by Dr. Caryl Comes   Anxiety: PCP did not want to Rx Xanax. She will try hydroxyzine again.   Medication Adjustments/Labs and Tests Ordered: Current medicines are reviewed at length with the patient today.  Concerns regarding medicines are outlined above.  Medication changes, Labs and Tests ordered today are listed in the Patient Instructions  below. Patient Instructions  Medication Instructions:  Your physician recommends that you continue on your current medications as directed. Please refer to the Current Medication list given to you today.  *If you need a refill on your cardiac medications before your next appointment, please call your pharmacy*  Lab Work: None If you have labs (blood work) drawn today and your tests are completely normal, you will receive your results only by: Marland Kitchen MyChart Message (if you have MyChart) OR . A paper copy in the mail If you have any lab test that is abnormal or we need to change your treatment, we will call you to review the results.  Testing/Procedures: None  Follow-Up: At [facility], you and your health needs are our priority.  As part of our continuing mission to provide you with exceptional heart care, we have created designated Provider Care Teams.  These Care Teams include your primary Cardiologist (physician) and Advanced Practice Providers (APPs -  Physician Assistants and Nurse Practitioners) who all work together to provide you with the care you need, when you need it.  Your next appointment:   12 months  The format for your next appointment:   In Person  Provider:   Nell Range, PA-C  Other Instructions     Signed, Angelena Form, PA-C  01/20/2019 4:45 PM    [facility] [address], [place], [place]  [address] Phone: [phone]; Fax: [phone]

## 2019-01-20 NOTE — Patient Instructions (Signed)
Medication Instructions:  Your physician recommends that you continue on your current medications as directed. Please refer to the Current Medication list given to you today.  *If you need a refill on your cardiac medications before your next appointment, please call your pharmacy*  Lab Work: None If you have labs (blood work) drawn today and your tests are completely normal, you will receive your results only by: Marland Kitchen MyChart Message (if you have MyChart) OR . A paper copy in the mail If you have any lab test that is abnormal or we need to change your treatment, we will call you to review the results.  Testing/Procedures: None  Follow-Up: At [facility], you and your health needs are our priority.  As part of our continuing mission to provide you with exceptional heart care, we have created designated Provider Care Teams.  These Care Teams include your primary Cardiologist (physician) and Advanced Practice Providers (APPs -  Physician Assistants and Nurse Practitioners) who all work together to provide you with the care you need, when you need it.  Your next appointment:   12 months  The format for your next appointment:   In Person  Provider:   Nell Range, PA-C  Other Instructions

## 2019-01-21 ENCOUNTER — Encounter: Payer: Self-pay | Admitting: Internal Medicine

## 2019-01-21 ENCOUNTER — Ambulatory Visit (INDEPENDENT_AMBULATORY_CARE_PROVIDER_SITE_OTHER): Payer: Medicare HMO | Admitting: Internal Medicine

## 2019-01-21 ENCOUNTER — Other Ambulatory Visit: Payer: Self-pay

## 2019-01-21 VITALS — BP 128/78 | HR 68 | Ht 65.0 in | Wt 110.2 lb

## 2019-01-21 DIAGNOSIS — I442 Atrioventricular block, complete: Secondary | ICD-10-CM

## 2019-01-21 DIAGNOSIS — I10 Essential (primary) hypertension: Secondary | ICD-10-CM

## 2019-01-21 DIAGNOSIS — I350 Nonrheumatic aortic (valve) stenosis: Secondary | ICD-10-CM

## 2019-01-21 LAB — CUP PACEART INCLINIC DEVICE CHECK
Battery Remaining Longevity: 151 mo
Battery Voltage: 3.14 V
Brady Statistic AP VP Percent: 0.84 %
Brady Statistic AP VS Percent: 0.04 %
Brady Statistic AS VP Percent: 88.01 %
Brady Statistic AS VS Percent: 11.1 %
Brady Statistic RA Percent Paced: 0.9 %
Brady Statistic RV Percent Paced: 88.86 %
Date Time Interrogation Session: 20201023193510
Implantable Lead Implant Date: 20200722
Implantable Lead Implant Date: 20200722
Implantable Lead Location: 753859
Implantable Lead Location: 753860
Implantable Lead Model: 1944
Implantable Lead Model: 1948
Implantable Pulse Generator Implant Date: 20200722
Lead Channel Impedance Value: 380 Ohm
Lead Channel Impedance Value: 437 Ohm
Lead Channel Impedance Value: 646 Ohm
Lead Channel Impedance Value: 779 Ohm
Lead Channel Pacing Threshold Amplitude: 0.5 V
Lead Channel Pacing Threshold Amplitude: 0.75 V
Lead Channel Pacing Threshold Pulse Width: 0.4 ms
Lead Channel Pacing Threshold Pulse Width: 0.4 ms
Lead Channel Sensing Intrinsic Amplitude: 4.1 mV
Lead Channel Setting Pacing Amplitude: 2 V
Lead Channel Setting Pacing Amplitude: 2.5 V
Lead Channel Setting Pacing Pulse Width: 0.4 ms
Lead Channel Setting Sensing Sensitivity: 2 mV

## 2019-01-21 LAB — CUP PACEART REMOTE DEVICE CHECK
Battery Remaining Longevity: 134 mo
Battery Voltage: 3.14 V
Brady Statistic AP VP Percent: 0.85 %
Brady Statistic AP VS Percent: 0.04 %
Brady Statistic AS VP Percent: 87.79 %
Brady Statistic AS VS Percent: 11.32 %
Brady Statistic RA Percent Paced: 0.91 %
Brady Statistic RV Percent Paced: 88.64 %
Date Time Interrogation Session: 20201022080615
Implantable Lead Implant Date: 20200722
Implantable Lead Implant Date: 20200722
Implantable Lead Location: 753859
Implantable Lead Location: 753860
Implantable Lead Model: 1944
Implantable Lead Model: 1948
Implantable Pulse Generator Implant Date: 20200722
Lead Channel Impedance Value: 361 Ohm
Lead Channel Impedance Value: 399 Ohm
Lead Channel Impedance Value: 570 Ohm
Lead Channel Impedance Value: 722 Ohm
Lead Channel Pacing Threshold Amplitude: 0.375 V
Lead Channel Pacing Threshold Amplitude: 0.75 V
Lead Channel Pacing Threshold Pulse Width: 0.4 ms
Lead Channel Pacing Threshold Pulse Width: 0.4 ms
Lead Channel Sensing Intrinsic Amplitude: 4.375 mV
Lead Channel Sensing Intrinsic Amplitude: 4.375 mV
Lead Channel Sensing Intrinsic Amplitude: 9.75 mV
Lead Channel Sensing Intrinsic Amplitude: 9.75 mV
Lead Channel Setting Pacing Amplitude: 3.25 V
Lead Channel Setting Pacing Amplitude: 3.25 V
Lead Channel Setting Pacing Pulse Width: 0.4 ms
Lead Channel Setting Sensing Sensitivity: 1.2 mV

## 2019-01-21 NOTE — Patient Instructions (Signed)
Medication Instructions:  Your physician recommends that you continue on your current medications as directed. Please refer to the Current Medication list given to you today.  Labwork: None ordered.  Testing/Procedures: None ordered.  Follow-Up: Your physician recommends that you schedule a follow-up appointment in:   9 months with Dr. Klein  Any Other Special Instructions Will Be Listed Below (If Applicable).     If you need a refill on your cardiac medications before your next appointment, please call your pharmacy.  

## 2019-01-21 NOTE — Progress Notes (Signed)
Patient Care Team: Leonard Downing, MD as PCP - General (Family Medicine) Fay Records, MD as PCP - Cardiology (Cardiology) Deboraha Sprang, MD as PCP - Electrophysiology (Cardiology)   HPI  [name] is a 83 y.o. female Seen in followup for pacemaker implanted 7/20 for syncope and high grade heart block  Also with Severe AS and s/p TAVR 9/20   DATE TEST EF   10/20 Echo   60-65% %         Much improved following pacing and TAVR no other syncope  No chest pain  No shortness of breath  Still smoking     Past Medical History:  Diagnosis Date  . Anxiety    Stable  . ASCUS on Pap smear 11/15/09  . Carotid artery disease (HCC)    1-39% RICA stenosis and A999333 LICA stenosis on pre TAVR dopplers   . Depression   . Endometrial polyp 11/15/2009  . Endometrioid adenocarcinoma   . H/O varicella   . Hemorrhoids   . History of measles, mumps, or rubella   . Hypercholesterolemia   . Hypertension   . Menopausal symptoms 06/13/10  . Ovarian cyst 11/15/09  . PMB (postmenopausal bleeding) 11/15/09  . Presence of permanent cardiac pacemaker 10/20/2018  . Vaginal bleeding     Past Surgical History:  Procedure Laterality Date  . BREAST SURGERY     Over twenty - five  years ago  . CATARACT EXTRACTION W/ INTRAOCULAR LENS IMPLANT Bilateral   . CERVICAL BIOPSY    . EYE SURGERY    . INSERT / REPLACE / REMOVE PACEMAKER  10/20/2018  . LAPAROSCOPIC ASSISTED VAGINAL HYSTERECTOMY  12/2009   BSO  . MULTIPLE EXTRACTIONS WITH ALVEOLOPLASTY N/A 12/09/2018   Procedure: EXTRACTION OF TOOTH #'S 6,21-29 AND 32 WITH ALVEOLOPLASTY AND BILATERAL MANDIBULAR TORI REDUCTIONS;  Surgeon: Lenn Cal, DDS;  Location: [place];  Service: Oral Surgery;  Laterality: N/A;  . PACEMAKER IMPLANT N/A 10/20/2018   Procedure: PACEMAKER IMPLANT;  Surgeon: Deboraha Sprang, MD;  Location: [place] [facility];  Service: Cardiovascular;  Laterality: N/A;  . RIGHT/LEFT HEART CATH AND CORONARY  ANGIOGRAPHY N/A 11/24/2018   Procedure: RIGHT/LEFT HEART CATH AND CORONARY ANGIOGRAPHY;  Surgeon: Burnell Blanks, MD;  Location: [place] [facility];  Service: Cardiovascular;  Laterality: N/A;  . TEE WITHOUT CARDIOVERSION N/A 12/21/2018   Procedure: TRANSESOPHAGEAL ECHOCARDIOGRAM (TEE);  Surgeon: Burnell Blanks, MD;  Location: [place];  Service: Open Heart Surgery;  Laterality: N/A;    Current Meds  Medication Sig  . acetaminophen (TYLENOL) 500 MG tablet Take 500 mg by mouth at bedtime.  . ALPRAZolam (XANAX) 0.25 MG tablet Take 1 tablet (0.25 mg total) by mouth 3 (three) times daily as needed for anxiety.  Marland Kitchen amLODipine (NORVASC) 10 MG tablet Take 1 tablet (10 mg total) by mouth daily.  Marland Kitchen amoxicillin (AMOXIL) 500 MG tablet Take 4 capsules (2,000 mg) one hour prior to all dental visits.  Marland Kitchen aspirin 81 MG chewable tablet Chew 1 tablet (81 mg total) by mouth daily.  . carboxymethylcellulose (REFRESH PLUS) 0.5 % SOLN Place 1 drop into both eyes daily as needed (dry eyes).   . chlorhexidine (PERIDEX) 0.12 % solution Rinse with 15 mls twice daily for 30 seconds. Use after breakfast and at bedtime. Spit out excess. Do not swallow.  . Cholecalciferol (VITAMIN D-3) 125 MCG (5000 UT) TABS Take 5,000 Units by mouth daily at 2 PM.  . citalopram (  CELEXA) 40 MG tablet Take 20 mg by mouth daily at 2 PM.   . hydrOXYzine (VISTARIL) 50 MG capsule Take 1 capsule (50 mg total) by mouth 3 (three) times daily as needed for anxiety.  Marland Kitchen losartan (COZAAR) 25 MG tablet Take 1 tablet (25 mg total) by mouth daily.  Marland Kitchen MAGNESIUM PO Take 2,000 mg by mouth as needed (sleep).   . Melatonin 5 MG TABS Take 5 mg by mouth at bedtime.  . Omega-3 Fatty Acids (FISH OIL) 1200 MG CAPS Take 1,200 mg by mouth daily at 2 PM.  . simvastatin (ZOCOR) 20 MG tablet Take 20 mg by mouth daily at 6 PM.   . vitamin B-12 (CYANOCOBALAMIN) 500 MCG tablet Take 500 mcg by mouth daily at 2 PM.    No Known Allergies    Review of  Systems negative except from HPI and PMH  Physical Exam BP 128/78   Pulse 68   Ht 5\' 5"  (1.651 m)   Wt 110 lb 3.2 oz (50 kg)   SpO2 97%   BMI 18.34 kg/m  Well developed and well nourished in no acute distress HENT normal E scleral and icterus clear Neck Supple JVP flat; carotids brisk and full Clear to ausculation Device pocket well healed; without hematoma or erythema.  There is no tethering  Regular rate and rhythm, no murmurs gallops or rub Soft with active bowel sounds No clubbing cyanosis Edema Alert and oriented, grossly normal motor and sensory function Skin Warm and Dry  ECG P-synchronous/ AV  pacing   Assessment and  Plan  Complete heart block  AS  S/p TAVR  Pacemaker Medtronic The patient's device was interrogated and the information was fully reviewed.  The device was reprogrammed to for long term energy conservation  \  Ventricular tachycardia  Nonsustained  During TAVR procedure   Encouraged smoking cessation  Euvolemic continue current meds    Current medicines are reviewed at length with the patient today .  The patient does not have concerns regarding medicines.

## 2019-02-01 NOTE — Progress Notes (Signed)
Remote pacemaker transmission.   

## 2019-02-17 ENCOUNTER — Other Ambulatory Visit: Payer: Self-pay

## 2019-02-17 ENCOUNTER — Ambulatory Visit (HOSPITAL_COMMUNITY): Payer: Medicare HMO | Admitting: Dentistry

## 2019-02-17 ENCOUNTER — Encounter (HOSPITAL_COMMUNITY): Payer: Self-pay | Admitting: Dentistry

## 2019-02-17 VITALS — BP 132/54 | HR 79 | Temp 98.9°F

## 2019-02-17 DIAGNOSIS — K08109 Complete loss of teeth, unspecified cause, unspecified class: Secondary | ICD-10-CM

## 2019-02-17 DIAGNOSIS — Z952 Presence of prosthetic heart valve: Secondary | ICD-10-CM

## 2019-02-17 DIAGNOSIS — K0820 Unspecified atrophy of edentulous alveolar ridge: Secondary | ICD-10-CM

## 2019-02-17 NOTE — Progress Notes (Signed)
PROGRESS NOTE:  02/17/2019 [name] [id number]  COVID 19 SCREENING: The patient does not symptoms concerning for COVID-19 infection (Including fever, chills, cough, or new SHORTNESS OF BREATH).    VITALS: BP (!) 132/54 (BP Location: Left Arm)   Pulse 79   Temp 98.9 F (37.2 C)    LABS:  Lab Results  Component Value Date   WBC 11.9 (H) 12/22/2018   HGB 12.1 12/22/2018   HCT 35.5 (L) 12/22/2018   MCV 91.0 12/22/2018   PLT 124 (L) 12/22/2018   BMET    Component Value Date/Time   NA 139 01/11/2019 1014   K 4.4 01/11/2019 1014   CL 102 01/11/2019 1014   CO2 24 01/11/2019 1014   GLUCOSE 79 01/11/2019 1014   GLUCOSE 131 (H) 12/22/2018 0245   BUN 13 01/11/2019 1014   CREATININE 0.74 01/11/2019 1014   CALCIUM 9.3 01/11/2019 1014   GFRNONAA 76 01/11/2019 1014   GFRAA 88 01/11/2019 1014    Lab Results  Component Value Date   INR 1.1 12/17/2018   No results found for: PTT   [name] is status post extraction of remaining teeth with alveoloplasty and bilateral mandibular lingual tori reductions in the operating room on 12/09/2018.  Patient now presents for RE-evaluation of healing.  SUBJECTIVE: Patient denies having any problems from the dental extraction sites. Patient is using the chlorhexidine rinse as directed.   EXAM: There is no sign of infection.  Patient is healing in well with generalized primary closure. The lower right anterior is healing in be secondary intention. There is still some soft tissue defect associated with healing in this area, but no exposed bone.  Patient is completely edentulous.  There is atrophy of the edentulous alveolar ridges.  ASSESSMENT: Post operative course is consistent with dental procedures performed in the operating room with general anesthesia. Loss of teeth due to extraction History of Delayed healing involving the lower right anterior surgical site. Patient is now completely edentulous There is atrophy of the  edentulous alveolar ridges.  PLAN: 1.  Patient is to continue chlorhexidine rinses twice daily as instructed after breakfast and at bedtime in a  swish and spit manner.   2.  Continue salt water rinses in between the chlorhexidine rinses as needed.   3.  Advance diet as tolerated but still keep a soft diet only.  Avoid trauma to the lower right anterior segment. 4.  Patient is to follow-up with the Dentist of her choice for fabrication of upper and lower complete dentures with or without implants. We discussed potential cost of implant therapy.  Will assist in referral to oral surgeon and prosthodontist if patient so desires.   Patient is currently considering following up with the [facility] in [place], [place] or the [facility] in [place].     Lenn Cal, DDS

## 2019-02-17 NOTE — Patient Instructions (Signed)
.  COVID-19 Education: The signs and symptoms of COVID-19 were discussed with the patient and how to seek care for testing (follow up with PCP or arrange E-visit).   The importance of social distancing was discussed today.   PLAN: 1.  Patient is to continue chlorhexidine rinses twice daily as instructed after breakfast and at bedtime in a  swish and spit manner.   2.  Continue salt water rinses in between the chlorhexidine rinses as needed.   3.  Advance diet as tolerated but still keep a soft diet only.  Avoid trauma to the lower right anterior segment. 4.  Patient is to follow-up with the Dentist of her choice for fabrication of upper and lower complete dentures with or without implants. We discussed potential cost of implant therapy.  Will assist in referral to oral surgeon and prosthodontist if patient so desires.   Patient is currently considering following up with the [facility] in [place], [place] or the [facility] in [place].     Lenn Cal, DDS

## 2019-03-04 DIAGNOSIS — R69 Illness, unspecified: Secondary | ICD-10-CM | POA: Diagnosis not present

## 2019-04-21 LAB — CUP PACEART REMOTE DEVICE CHECK
Battery Remaining Longevity: 144 mo
Battery Voltage: 3.09 V
Brady Statistic AP VP Percent: 0.14 %
Brady Statistic AP VS Percent: 0 %
Brady Statistic AS VP Percent: 99.85 %
Brady Statistic AS VS Percent: 0.01 %
Brady Statistic RA Percent Paced: 0.14 %
Brady Statistic RV Percent Paced: 99.99 %
Date Time Interrogation Session: 20210121034012
Implantable Lead Implant Date: 20200722
Implantable Lead Implant Date: 20200722
Implantable Lead Location: 753859
Implantable Lead Location: 753860
Implantable Lead Model: 1944
Implantable Lead Model: 1948
Implantable Pulse Generator Implant Date: 20200722
Lead Channel Impedance Value: 380 Ohm
Lead Channel Impedance Value: 437 Ohm
Lead Channel Impedance Value: 570 Ohm
Lead Channel Impedance Value: 703 Ohm
Lead Channel Pacing Threshold Amplitude: 0.375 V
Lead Channel Pacing Threshold Amplitude: 0.625 V
Lead Channel Pacing Threshold Pulse Width: 0.4 ms
Lead Channel Pacing Threshold Pulse Width: 0.4 ms
Lead Channel Sensing Intrinsic Amplitude: 3 mV
Lead Channel Sensing Intrinsic Amplitude: 3 mV
Lead Channel Sensing Intrinsic Amplitude: 9.75 mV
Lead Channel Sensing Intrinsic Amplitude: 9.75 mV
Lead Channel Setting Pacing Amplitude: 1.5 V
Lead Channel Setting Pacing Amplitude: 2.5 V
Lead Channel Setting Pacing Pulse Width: 0.4 ms
Lead Channel Setting Sensing Sensitivity: 2 mV

## 2019-04-25 ENCOUNTER — Ambulatory Visit (INDEPENDENT_AMBULATORY_CARE_PROVIDER_SITE_OTHER): Payer: Medicare HMO | Admitting: *Deleted

## 2019-04-25 DIAGNOSIS — I442 Atrioventricular block, complete: Secondary | ICD-10-CM | POA: Diagnosis not present

## 2019-04-25 LAB — CUP PACEART REMOTE DEVICE CHECK
Battery Remaining Longevity: 143 mo
Battery Voltage: 3.09 V
Brady Statistic AP VP Percent: 0.17 %
Brady Statistic AP VS Percent: 0 %
Brady Statistic AS VP Percent: 99.81 %
Brady Statistic AS VS Percent: 0.02 %
Brady Statistic RA Percent Paced: 0.17 %
Brady Statistic RV Percent Paced: 99.98 %
Date Time Interrogation Session: 20210125034011
Implantable Lead Implant Date: 20200722
Implantable Lead Implant Date: 20200722
Implantable Lead Location: 753859
Implantable Lead Location: 753860
Implantable Lead Model: 1944
Implantable Lead Model: 1948
Implantable Pulse Generator Implant Date: 20200722
Lead Channel Impedance Value: 361 Ohm
Lead Channel Impedance Value: 418 Ohm
Lead Channel Impedance Value: 570 Ohm
Lead Channel Impedance Value: 684 Ohm
Lead Channel Pacing Threshold Amplitude: 0.5 V
Lead Channel Pacing Threshold Amplitude: 0.5 V
Lead Channel Pacing Threshold Pulse Width: 0.4 ms
Lead Channel Pacing Threshold Pulse Width: 0.4 ms
Lead Channel Sensing Intrinsic Amplitude: 1.75 mV
Lead Channel Sensing Intrinsic Amplitude: 1.75 mV
Lead Channel Sensing Intrinsic Amplitude: 9.75 mV
Lead Channel Sensing Intrinsic Amplitude: 9.75 mV
Lead Channel Setting Pacing Amplitude: 1.5 V
Lead Channel Setting Pacing Amplitude: 2.5 V
Lead Channel Setting Pacing Pulse Width: 0.4 ms
Lead Channel Setting Sensing Sensitivity: 2 mV

## 2019-06-16 DIAGNOSIS — R69 Illness, unspecified: Secondary | ICD-10-CM | POA: Diagnosis not present

## 2019-07-26 ENCOUNTER — Ambulatory Visit (INDEPENDENT_AMBULATORY_CARE_PROVIDER_SITE_OTHER): Payer: Medicare HMO | Admitting: *Deleted

## 2019-07-26 DIAGNOSIS — I442 Atrioventricular block, complete: Secondary | ICD-10-CM

## 2019-07-26 LAB — CUP PACEART REMOTE DEVICE CHECK
Battery Remaining Longevity: 137 mo
Battery Voltage: 3.05 V
Brady Statistic AP VP Percent: 0.28 %
Brady Statistic AP VS Percent: 0 %
Brady Statistic AS VP Percent: 99.66 %
Brady Statistic AS VS Percent: 0.07 %
Brady Statistic RA Percent Paced: 0.27 %
Brady Statistic RV Percent Paced: 99.93 %
Date Time Interrogation Session: 20210427074829
Implantable Lead Implant Date: 20200722
Implantable Lead Implant Date: 20200722
Implantable Lead Location: 753859
Implantable Lead Location: 753860
Implantable Lead Model: 1944
Implantable Lead Model: 1948
Implantable Pulse Generator Implant Date: 20200722
Lead Channel Impedance Value: 380 Ohm
Lead Channel Impedance Value: 418 Ohm
Lead Channel Impedance Value: 532 Ohm
Lead Channel Impedance Value: 646 Ohm
Lead Channel Pacing Threshold Amplitude: 0.5 V
Lead Channel Pacing Threshold Amplitude: 0.625 V
Lead Channel Pacing Threshold Pulse Width: 0.4 ms
Lead Channel Pacing Threshold Pulse Width: 0.4 ms
Lead Channel Sensing Intrinsic Amplitude: 2 mV
Lead Channel Sensing Intrinsic Amplitude: 2 mV
Lead Channel Sensing Intrinsic Amplitude: 9.75 mV
Lead Channel Sensing Intrinsic Amplitude: 9.75 mV
Lead Channel Setting Pacing Amplitude: 1.5 V
Lead Channel Setting Pacing Amplitude: 2.5 V
Lead Channel Setting Pacing Pulse Width: 0.4 ms
Lead Channel Setting Sensing Sensitivity: 2 mV

## 2019-07-27 NOTE — Progress Notes (Signed)
PPM Remote  

## 2019-10-10 ENCOUNTER — Other Ambulatory Visit: Payer: Self-pay | Admitting: Physician Assistant

## 2019-10-10 DIAGNOSIS — Z952 Presence of prosthetic heart valve: Secondary | ICD-10-CM

## 2019-10-25 ENCOUNTER — Ambulatory Visit (INDEPENDENT_AMBULATORY_CARE_PROVIDER_SITE_OTHER): Payer: Medicare HMO | Admitting: *Deleted

## 2019-10-25 DIAGNOSIS — I442 Atrioventricular block, complete: Secondary | ICD-10-CM

## 2019-10-26 LAB — CUP PACEART REMOTE DEVICE CHECK
Battery Remaining Longevity: 134 mo
Battery Voltage: 3.03 V
Brady Statistic AP VP Percent: 0.16 %
Brady Statistic AP VS Percent: 0 %
Brady Statistic AS VP Percent: 99.81 %
Brady Statistic AS VS Percent: 0.03 %
Brady Statistic RA Percent Paced: 0.16 %
Brady Statistic RV Percent Paced: 99.97 %
Date Time Interrogation Session: 20210728012415
Implantable Lead Implant Date: 20200722
Implantable Lead Implant Date: 20200722
Implantable Lead Location: 753859
Implantable Lead Location: 753860
Implantable Lead Model: 1944
Implantable Lead Model: 1948
Implantable Pulse Generator Implant Date: 20200722
Lead Channel Impedance Value: 361 Ohm
Lead Channel Impedance Value: 418 Ohm
Lead Channel Impedance Value: 532 Ohm
Lead Channel Impedance Value: 646 Ohm
Lead Channel Pacing Threshold Amplitude: 0.5 V
Lead Channel Pacing Threshold Amplitude: 0.625 V
Lead Channel Pacing Threshold Pulse Width: 0.4 ms
Lead Channel Pacing Threshold Pulse Width: 0.4 ms
Lead Channel Sensing Intrinsic Amplitude: 1.625 mV
Lead Channel Sensing Intrinsic Amplitude: 1.625 mV
Lead Channel Sensing Intrinsic Amplitude: 9.75 mV
Lead Channel Sensing Intrinsic Amplitude: 9.75 mV
Lead Channel Setting Pacing Amplitude: 1.5 V
Lead Channel Setting Pacing Amplitude: 2.5 V
Lead Channel Setting Pacing Pulse Width: 0.4 ms
Lead Channel Setting Sensing Sensitivity: 2 mV

## 2019-10-31 NOTE — Progress Notes (Signed)
Remote pacemaker transmission.   

## 2019-11-10 ENCOUNTER — Other Ambulatory Visit (HOSPITAL_COMMUNITY): Payer: Medicare HMO

## 2019-11-18 ENCOUNTER — Other Ambulatory Visit: Payer: Self-pay | Admitting: Physician Assistant

## 2019-11-18 DIAGNOSIS — I10 Essential (primary) hypertension: Secondary | ICD-10-CM | POA: Diagnosis not present

## 2019-11-18 DIAGNOSIS — R69 Illness, unspecified: Secondary | ICD-10-CM | POA: Diagnosis not present

## 2019-11-18 DIAGNOSIS — N3090 Cystitis, unspecified without hematuria: Secondary | ICD-10-CM | POA: Diagnosis not present

## 2019-12-08 ENCOUNTER — Encounter: Payer: Self-pay | Admitting: Physician Assistant

## 2019-12-08 ENCOUNTER — Ambulatory Visit (HOSPITAL_COMMUNITY): Payer: Medicare HMO | Attending: Cardiology

## 2019-12-08 ENCOUNTER — Ambulatory Visit (INDEPENDENT_AMBULATORY_CARE_PROVIDER_SITE_OTHER): Payer: Medicare HMO | Admitting: Physician Assistant

## 2019-12-08 ENCOUNTER — Other Ambulatory Visit: Payer: Self-pay

## 2019-12-08 VITALS — BP 112/60 | HR 70 | Ht 65.0 in | Wt 104.6 lb

## 2019-12-08 DIAGNOSIS — I10 Essential (primary) hypertension: Secondary | ICD-10-CM

## 2019-12-08 DIAGNOSIS — Z950 Presence of cardiac pacemaker: Secondary | ICD-10-CM

## 2019-12-08 DIAGNOSIS — I779 Disorder of arteries and arterioles, unspecified: Secondary | ICD-10-CM | POA: Diagnosis not present

## 2019-12-08 DIAGNOSIS — Z952 Presence of prosthetic heart valve: Secondary | ICD-10-CM | POA: Insufficient documentation

## 2019-12-08 LAB — ECHOCARDIOGRAM COMPLETE
AR max vel: 0.44 cm2
AV Area VTI: 0.51 cm2
AV Area mean vel: 0.44 cm2
AV Mean grad: 10 mmHg
AV Peak grad: 18.3 mmHg
Ao pk vel: 2.14 m/s
Area-P 1/2: 6.32 cm2
S' Lateral: 1.9 cm

## 2019-12-08 NOTE — Patient Instructions (Signed)
Medication Instructions:  1) STOP LOSARTAN *If you need a refill on your cardiac medications before your next appointment, please call your pharmacy*  Testing/Procedures: Your physician has requested that you have a carotid duplex at [facility]. This test is an ultrasound of the carotid arteries in your neck. It looks at blood flow through these arteries that supply the brain with blood. Allow one hour for this exam. There are no restrictions or special instructions.  Follow-Up: At [facility], you and your health needs are our priority.  As part of our continuing mission to provide you with exceptional heart care, we have created designated Provider Care Teams.  These Care Teams include your primary Cardiologist (physician) and Advanced Practice Providers (APPs -  Physician Assistants and Nurse Practitioners) who all work together to provide you with the care you need, when you need it. Your next appointment:   9 month(s) The format for your next appointment:   In Person Provider:   Virl Axe, MD

## 2019-12-08 NOTE — Progress Notes (Addendum)
[facility] [place]                                       Cardiology Office Note    Date:  12/09/2019   ID:  [name], DOB 1935-11-11, MRN [id number]  PCP:  Leonard Downing, MD  Cardiologist: Dr. Caryl Comes / Dr. Angelena Form & Dr. Cyndia Bent (TAVR)  CC: 1 year s/p TAVR  History of Present Illness:  [name] is a 84 y.o. 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   She was admitted to [facility] on 10/20/2018 with near syncope with 2-1 AV block with intermittent complete heart block. An echocardiogram at that time showed severe aortic stenosis with a peak velocity of 4.2 m/s, mean gradient of 42 mmHg, and calculated aortic valve area of 0.48 cm. Left ventricular systolic function was normal with ejection fraction of greater than 65%. She underwent successful pacemaker placement via the left subclavian vein by Dr. Caryl Comes. She was subsequently evaluated by Dr. Angelena Form for consideration of aortic valve replacement and underwent cardiac catheterization showing mild nonobstructive coronary disease. The mean gradient was 39 mmHg with a peak to peak gradient of 47 mmHg and an aortic valve area of 0.48 cm.  The patient was evaluated by the multidisciplinary valve team and underwent successful TAVR with a 23 mm Edwards Sapien 3 Ultra THV via the subclavian approach on 12/21/18. Post operative echo showed EF 60-65%, normally functioning TAVR with mean gradient of 10 mm Hg and no PVL. She was discharged home on aspirin and plavix.   At one week follow up she had a pruritic, diffuse erythematous rash all over her trunk as well as pruritis in her scalp which is new since TAVR. This was felt to be related to her plavix and this was discontinued. She was continued on only an aspirin.  1 month echo showed EF 60-65%, normally functioning TAVR with mean gradient 6.3 mm Hg and no PVL.   Today she presents to clinic for follow up. Here with daughter. Doing well. Still smoking but has cut back. Still has dyspnea with activity but overall better than before TAVR. No CP.Marland Kitchen No LE edema, orthopnea or PND. She has occasional dizziness but no syncope. No blood in stool or urine. No palpitations. BP has been running lower which makes her feel fatigued and wants to know if she can stop taking some of her BP meds.      Past Medical History:  Diagnosis Date  . Anxiety    Stable  . ASCUS on Pap smear 11/15/09  . Carotid artery disease (HCC)    1-39% RICA stenosis and 50-53% LICA stenosis on pre TAVR dopplers   . Depression   . Endometrial polyp 11/15/2009  . Endometrioid adenocarcinoma   . H/O varicella   . Hemorrhoids   . History of measles, mumps, or rubella   . Hypercholesterolemia   . Hypertension   . Menopausal symptoms 06/13/10  . Ovarian cyst 11/15/09  . PMB (postmenopausal bleeding) 11/15/09  . Presence of permanent cardiac pacemaker 10/20/2018  . Vaginal bleeding     Past Surgical History:  Procedure Laterality Date  . BREAST SURGERY     Over  twenty - five  years ago  . CATARACT EXTRACTION W/ INTRAOCULAR LENS IMPLANT Bilateral   . CERVICAL BIOPSY    . EYE SURGERY    . INSERT / REPLACE / REMOVE PACEMAKER  10/20/2018  . LAPAROSCOPIC ASSISTED VAGINAL HYSTERECTOMY  12/2009   BSO  . MULTIPLE EXTRACTIONS WITH ALVEOLOPLASTY N/A 12/09/2018   Procedure: EXTRACTION OF TOOTH #'S 6,21-29 AND 32 WITH ALVEOLOPLASTY AND BILATERAL MANDIBULAR TORI REDUCTIONS;  Surgeon: Lenn Cal, DDS;  Location: [facility];  Service: Oral Surgery;  Laterality: N/A;  . PACEMAKER IMPLANT N/A 10/20/2018   Procedure: PACEMAKER IMPLANT;  Surgeon: Deboraha Sprang, MD;  Location: [place] CV [facility];  Service: Cardiovascular;  Laterality: N/A;  . RIGHT/LEFT HEART CATH AND CORONARY ANGIOGRAPHY N/A 11/24/2018     Procedure: RIGHT/LEFT HEART CATH AND CORONARY ANGIOGRAPHY;  Surgeon: Burnell Blanks, MD;  Location: [place] [facility];  Service: Cardiovascular;  Laterality: N/A;  . TEE WITHOUT CARDIOVERSION N/A 12/21/2018   Procedure: TRANSESOPHAGEAL ECHOCARDIOGRAM (TEE);  Surgeon: Burnell Blanks, MD;  Location: [facility];  Service: Open Heart Surgery;  Laterality: N/A;    Current Medications: Outpatient Medications Prior to Visit  Medication Sig Dispense Refill  . acetaminophen (TYLENOL) 500 MG tablet Take 500 mg by mouth at bedtime.    Marland Kitchen amLODipine (NORVASC) 10 MG tablet Take 1 tablet by mouth once daily 90 tablet 0  . amoxicillin (AMOXIL) 500 MG tablet Take 4 capsules (2,000 mg) one hour prior to all dental visits. 12 tablet 8  . aspirin 81 MG chewable tablet Chew 1 tablet (81 mg total) by mouth daily.    . carboxymethylcellulose (REFRESH PLUS) 0.5 % SOLN Place 1 drop into both eyes daily as needed (dry eyes).     . Cholecalciferol (VITAMIN D-3) 125 MCG (5000 UT) TABS Take 5,000 Units by mouth daily at 2 PM.    . citalopram (CELEXA) 40 MG tablet Take 20 mg by mouth daily at 2 PM.     . hydrOXYzine (VISTARIL) 50 MG capsule Take 1 capsule (50 mg total) by mouth 3 (three) times daily as needed for anxiety.    . simvastatin (ZOCOR) 20 MG tablet Take 20 mg by mouth daily at 6 PM.     . vitamin B-12 (CYANOCOBALAMIN) 500 MCG tablet Take 500 mcg by mouth daily at 2 PM.    . vitamin E (VITAMIN E) 180 MG (400 UNITS) capsule Take 400 Units by mouth daily.    Marland Kitchen losartan (COZAAR) 25 MG tablet Take 1 tablet (25 mg total) by mouth daily. FOLLOW UP APPT NEEDED FOR FUTURE REFILLS 90 tablet 0  . MAGNESIUM PO Take 2,000 mg by mouth as needed (sleep).     . Melatonin 5 MG TABS Take 5 mg by mouth at bedtime.    . Omega-3 Fatty Acids (FISH OIL) 1200 MG CAPS Take 1,200 mg by mouth daily at 2 PM.     No facility-administered medications prior to visit.     Allergies:   Patient has no known allergies.    Social History   Socioeconomic History  . Marital status: Legally Separated    Spouse name: Not on file  . Number of children: Not on file  . Years of education: Not on file  . Highest education level: Not on file  Occupational History  . Occupation: Retired-sales rep  Tobacco Use  . Smoking status: Current Every Day Smoker    Packs/day: 0.50    Years: 55.00    Pack years:  27.50    Types: Cigarettes  . Smokeless tobacco: Never Used  . Tobacco comment: 3 cig/day  Vaping Use  . Vaping Use: Never used  Substance and Sexual Activity  . Alcohol use: No  . Drug use: Not Currently  . Sexual activity: Not on file  Other Topics Concern  . Not on file  Social History Narrative  . Not on file   Social Determinants of Health   Financial Resource Strain:   . Difficulty of Paying Living Expenses: Not on file  Food Insecurity:   . Worried About Charity fundraiser in the Last Year: Not on file  . Ran Out of Food in the Last Year: Not on file  Transportation Needs:   . Lack of Transportation (Medical): Not on file  . Lack of Transportation (Non-Medical): Not on file  Physical Activity:   . Days of Exercise per Week: Not on file  . Minutes of Exercise per Session: Not on file  Stress:   . Feeling of Stress : Not on file  Social Connections:   . Frequency of Communication with Friends and Family: Not on file  . Frequency of Social Gatherings with Friends and Family: Not on file  . Attends Religious Services: Not on file  . Active Member of Clubs or Organizations: Not on file  . Attends Archivist Meetings: Not on file  . Marital Status: Not on file     Family History:  The patient's family history includes Heart disease in her brother; Heart failure in her mother; Lung cancer in her brother; Parkinson's disease in her father.     ROS:   Please see the history of present illness.    ROS All other systems reviewed and are negative.   PHYSICAL EXAM:   VS:  BP  112/60   Pulse 70   Ht 5\' 5"  (1.651 m)   Wt 104 lb 9.6 oz (47.4 kg)   SpO2 93%   BMI 17.41 kg/m    GEN: Well nourished, well developed, in no acute distress HEENT: normal Neck: no JVD or masses, + left bruit Cardiac: RRR; soft murmur. No rubs, or gallops,no edema  Respiratory:  clear to auscultation bilaterally, normal work of breathing GI: soft, nontender, nondistended, + BS MS: no deformity or atrophy Skin: warm and dry, no rash.  Neuro:  Alert and Oriented x 3, Strength and sensation are intact Psych: euthymic mood, full affect   Wt Readings from Last 3 Encounters:  12/08/19 104 lb 9.6 oz (47.4 kg)  01/21/19 110 lb 3.2 oz (50 kg)  01/20/19 109 lb (49.4 kg)      Studies/Labs Reviewed:   EKG:  EKG is NOT ordered today.    Recent Labs: 12/17/2018: ALT <5; B Natriuretic Peptide 142.7 12/22/2018: Hemoglobin 12.1; Magnesium 1.9; Platelets 124 01/11/2019: BUN 13; Creatinine, Ser 0.74; Potassium 4.4; Sodium 139   Lipid Panel No results found for: CHOL, TRIG, HDL, CHOLHDL, VLDL, LDLCALC, LDLDIRECT  Additional studies/ records that were reviewed today include:  TAVR OPERATIVE NOTE   Date of Procedure:                12/21/2018  Preoperative Diagnosis:      Severe Aortic Stenosis   Postoperative Diagnosis:    Same   Procedure:        Transcatheter Aortic Valve Replacement - Right Subclavian Approach             Edwards Sapien 3 Ultra THV (size 23 mm,  model # [id number], serial # [id number])              Co-Surgeons:                        Gaye Pollack, MD and Lauree Chandler, MD    Anesthesiologist:                  Caryl Comes, MD  Echocardiographer:              Bertrum Sol, MD  Pre-operative Echo Findings: ? Severe aortic stenosis ? Normal left ventricular systolic function  Post-operative Echo Findings: ? No paravalvular leak ? Normal left ventricular systolic function  ____________________   Echo 12/22/2018 IMPRESSIONS  1. Left  ventricular ejection fraction, by visual estimation, is 60 to 65%. The left ventricle has normal function. Normal left ventricular size. There is no left ventricular hypertrophy.  2. Left ventricular diastolic Doppler parameters are consistent with impaired relaxation pattern of LV diastolic filling.  3. Global right ventricle has normal systolic function.The right ventricular size is normal. No increase in right ventricular wall thickness.  4. Left atrial size was mildly dilated.  5. Right atrial size was normal.  6. Mild mitral annular calcification.  7. The mitral valve is normal in structure. No evidence of mitral valve regurgitation. No evidence of mitral stenosis.  8. The tricuspid valve is normal in structure. Tricuspid valve regurgitation is mild.  9. Aortic valve area, by VTI measures 1.25 cm. 10. Aortic valve mean gradient measures 10.0 mmHg. 11. Aortic valve peak gradient measures 20.1 mmHg. 12. The aortic valve is normal in structure. Aortic valve regurgitation was not visualized by color flow Doppler. Structurally normal aortic valve, with no evidence of sclerosis or stenosis. 13. Edwards Sapien 3 Ultra THV (size 23 mm, model # [id number], serial # [id number]). 14. The pulmonic valve was normal in structure. Pulmonic valve regurgitation is not visualized by color flow Doppler. 15. Normal pulmonary artery systolic pressure. 16. A pacer wire is visualized. 17. The inferior vena cava is normal in size with greater than 50% respiratory variability, suggesting right atrial pressure of 3 mmHg.   ____________________   Echo 01/20/19 IMPRESSIONS  1. Left ventricular ejection fraction, by visual estimation, is 60 to 65%. The left ventricle has normal function. Normal left ventricular size. There is no left ventricular hypertrophy.  2. Left ventricular diastolic Doppler parameters are consistent with impaired relaxation pattern of LV diastolic filling.  3. Global right ventricle has normal  systolic function.The right ventricular size is normal. No increase in right ventricular wall thickness.  4. Left atrial size was severely dilated  5. Right atrial size was normal.  6. Moderate mitral annular calcification.  7. The mitral valve is normal in structure. Trace mitral valve regurgitation. No evidence of mitral stenosis.  8. The tricuspid valve is normal in structure. Tricuspid valve regurgitation is mild.  9. Aortic valve regurgitation was not visualized by color flow Doppler. Structurally normal aortic valve, with no evidence of sclerosis or stenosis. 10. Peak transaortic velocity 1.25m/s, mean gradient 16mmHg. 11. The pulmonic valve was normal in structure. Pulmonic valve regurgitation is not visualized by color flow Doppler. 12. Mildly elevated pulmonary artery systolic pressure. 13. The tricuspid regurgitant velocity is 2.48 m/s, and with an assumed right atrial pressure of 8 mmHg, the estimated right ventricular systolic pressure is mildly elevated at 32.6 mmHg. 14. A pacer wire is visualized. 15. The inferior vena cava is  dilated in size with >50% respiratory variability, suggesting right atrial pressure of 8 mmHg.   ___________________  Echo 12/08/19 IMPRESSIONS 1. Left ventricular ejection fraction, by estimation, is 55 to 60%. The  left ventricle has normal function. The left ventricle has no regional  wall motion abnormalities. Left ventricular diastolic parameters are  consistent with Grade I diastolic  dysfunction (impaired relaxation).  2. Peak RV-RA gradient 29 mmHg. Right ventricular systolic function is  normal. The right ventricular size is normal.  3. Bioprosthetic aortic valve s/p TAVR, 23 mm Edwards Sapien valve. Mean  gradient 10 mmHg. No significant bioprosthetic valve stenosis or  regurgitation.  4. The mitral valve is normal in structure. Trivial mitral valve  regurgitation. No evidence of mitral stenosis.  5. The IVC was not visualized.  6.  Technically difficult study with poor acoustic windows.    ASSESSMENT & PLAN:   Severe AS s/p TAVR: echo today shows EF 55%, normally functioning TAVR with mean gradient 10 mm Hg and no PVL. She has NYHA class II symptoms; mostly of fatigue. SBE prophylaxis discussed; she has amoxicillin. Continue on aspirin alone indefinitely.   HTN: BP well controlled. She says she feels better with her BP a little higher. We have decided to stop losartan today.   Carotid artery disease: pre TAVR dopplers showed 62-94% LICA stenosis. Plan to get repeat dopplers in 10/2019. Will have this ordered today.  S/p PPM: followed by Dr. Caryl Comes. Continue regular follow up.     Medication Adjustments/Labs and Tests Ordered: Current medicines are reviewed at length with the patient today.  Concerns regarding medicines are outlined above.  Medication changes, Labs and Tests ordered today are listed in the Patient Instructions below. Patient Instructions  Medication Instructions:  1) STOP LOSARTAN *If you need a refill on your cardiac medications before your next appointment, please call your pharmacy*  Testing/Procedures: Your physician has requested that you have a carotid duplex at [facility]. This test is an ultrasound of the carotid arteries in your neck. It looks at blood flow through these arteries that supply the brain with blood. Allow one hour for this exam. There are no restrictions or special instructions.  Follow-Up: At [facility], you and your health needs are our priority.  As part of our continuing mission to provide you with exceptional heart care, we have created designated Provider Care Teams.  These Care Teams include your primary Cardiologist (physician) and Advanced Practice Providers (APPs -  Physician Assistants and Nurse Practitioners) who all work together to provide you with the care you need, when you need it. Your next appointment:   9 month(s) The format for your next appointment:    In Person Provider:   Virl Axe, MD     Signed, Angelena Form, PA-C  12/09/2019 9:55 AM    [address], [place], [place]  [address] Phone: [phone]; Fax: [phone]

## 2019-12-26 ENCOUNTER — Ambulatory Visit (HOSPITAL_COMMUNITY)
Admission: RE | Admit: 2019-12-26 | Discharge: 2019-12-26 | Disposition: A | Discharge status: Home / Self Care | Payer: Medicare HMO | Source: Ambulatory Visit | Attending: Physician Assistant | Admitting: Physician Assistant

## 2019-12-26 ENCOUNTER — Other Ambulatory Visit: Payer: Self-pay

## 2019-12-26 DIAGNOSIS — I779 Disorder of arteries and arterioles, unspecified: Secondary | ICD-10-CM | POA: Insufficient documentation

## 2019-12-26 NOTE — Progress Notes (Signed)
VASCULAR LAB    Carotid duplex has been performed.  See CV proc for preliminary results.   Nikeshia Keetch, RVT 12/26/2019, 2:20 PM

## 2019-12-27 ENCOUNTER — Other Ambulatory Visit: Payer: Self-pay | Admitting: *Deleted

## 2019-12-27 DIAGNOSIS — I779 Disorder of arteries and arterioles, unspecified: Secondary | ICD-10-CM

## 2019-12-29 DIAGNOSIS — Z23 Encounter for immunization: Secondary | ICD-10-CM | POA: Diagnosis not present

## 2020-01-24 ENCOUNTER — Ambulatory Visit (INDEPENDENT_AMBULATORY_CARE_PROVIDER_SITE_OTHER): Payer: Medicare HMO

## 2020-01-24 DIAGNOSIS — I442 Atrioventricular block, complete: Secondary | ICD-10-CM | POA: Diagnosis not present

## 2020-01-24 LAB — CUP PACEART REMOTE DEVICE CHECK
Battery Remaining Longevity: 131 mo
Battery Voltage: 3.02 V
Brady Statistic AP VP Percent: 0.23 %
Brady Statistic AP VS Percent: 0 %
Brady Statistic AS VP Percent: 99.73 %
Brady Statistic AS VS Percent: 0.04 %
Brady Statistic RA Percent Paced: 0.23 %
Brady Statistic RV Percent Paced: 99.96 %
Date Time Interrogation Session: 20211026023839
Implantable Lead Implant Date: 20200722
Implantable Lead Implant Date: 20200722
Implantable Lead Location: 753859
Implantable Lead Location: 753860
Implantable Lead Model: 1944
Implantable Lead Model: 1948
Implantable Pulse Generator Implant Date: 20200722
Lead Channel Impedance Value: 380 Ohm
Lead Channel Impedance Value: 418 Ohm
Lead Channel Impedance Value: 551 Ohm
Lead Channel Impedance Value: 665 Ohm
Lead Channel Pacing Threshold Amplitude: 0.625 V
Lead Channel Pacing Threshold Amplitude: 0.625 V
Lead Channel Pacing Threshold Pulse Width: 0.4 ms
Lead Channel Pacing Threshold Pulse Width: 0.4 ms
Lead Channel Sensing Intrinsic Amplitude: 1.375 mV
Lead Channel Sensing Intrinsic Amplitude: 1.375 mV
Lead Channel Sensing Intrinsic Amplitude: 9.75 mV
Lead Channel Sensing Intrinsic Amplitude: 9.75 mV
Lead Channel Setting Pacing Amplitude: 1.5 V
Lead Channel Setting Pacing Amplitude: 2.5 V
Lead Channel Setting Pacing Pulse Width: 0.4 ms
Lead Channel Setting Sensing Sensitivity: 2 mV

## 2020-01-30 NOTE — Progress Notes (Signed)
Remote pacemaker transmission.   

## 2020-03-31 ENCOUNTER — Other Ambulatory Visit: Payer: Self-pay | Admitting: Internal Medicine

## 2020-04-24 ENCOUNTER — Ambulatory Visit (INDEPENDENT_AMBULATORY_CARE_PROVIDER_SITE_OTHER): Payer: Medicare HMO

## 2020-04-24 DIAGNOSIS — I442 Atrioventricular block, complete: Secondary | ICD-10-CM | POA: Diagnosis not present

## 2020-04-24 LAB — CUP PACEART REMOTE DEVICE CHECK
Battery Remaining Longevity: 126 mo
Battery Voltage: 3.02 V
Brady Statistic AP VP Percent: 0.41 %
Brady Statistic AP VS Percent: 0 %
Brady Statistic AS VP Percent: 99.48 %
Brady Statistic AS VS Percent: 0.11 %
Brady Statistic RA Percent Paced: 0.41 %
Brady Statistic RV Percent Paced: 99.89 %
Date Time Interrogation Session: 20220125040600
Implantable Lead Implant Date: 20200722
Implantable Lead Implant Date: 20200722
Implantable Lead Location: 753859
Implantable Lead Location: 753860
Implantable Lead Model: 1944
Implantable Lead Model: 1948
Implantable Pulse Generator Implant Date: 20200722
Lead Channel Impedance Value: 361 Ohm
Lead Channel Impedance Value: 418 Ohm
Lead Channel Impedance Value: 513 Ohm
Lead Channel Impedance Value: 627 Ohm
Lead Channel Pacing Threshold Amplitude: 0.5 V
Lead Channel Pacing Threshold Amplitude: 0.625 V
Lead Channel Pacing Threshold Pulse Width: 0.4 ms
Lead Channel Pacing Threshold Pulse Width: 0.4 ms
Lead Channel Sensing Intrinsic Amplitude: 2.625 mV
Lead Channel Sensing Intrinsic Amplitude: 2.625 mV
Lead Channel Sensing Intrinsic Amplitude: 9.75 mV
Lead Channel Sensing Intrinsic Amplitude: 9.75 mV
Lead Channel Setting Pacing Amplitude: 1.5 V
Lead Channel Setting Pacing Amplitude: 2.5 V
Lead Channel Setting Pacing Pulse Width: 0.4 ms
Lead Channel Setting Sensing Sensitivity: 2 mV

## 2020-05-05 NOTE — Progress Notes (Signed)
Remote pacemaker transmission.   

## 2020-06-18 ENCOUNTER — Observation Stay (HOSPITAL_COMMUNITY)
Admission: EM | Admit: 2020-06-18 | Discharge: 2020-06-20 | Disposition: A | Discharge status: Home / Self Care | Payer: Medicare HMO | Attending: Internal Medicine | Admitting: Internal Medicine

## 2020-06-18 ENCOUNTER — Encounter (HOSPITAL_COMMUNITY): Payer: Self-pay | Admitting: Emergency Medicine

## 2020-06-18 DIAGNOSIS — Z20822 Contact with and (suspected) exposure to covid-19: Secondary | ICD-10-CM | POA: Insufficient documentation

## 2020-06-18 DIAGNOSIS — I2510 Atherosclerotic heart disease of native coronary artery without angina pectoris: Secondary | ICD-10-CM | POA: Insufficient documentation

## 2020-06-18 DIAGNOSIS — F17210 Nicotine dependence, cigarettes, uncomplicated: Secondary | ICD-10-CM | POA: Insufficient documentation

## 2020-06-18 DIAGNOSIS — Z79899 Other long term (current) drug therapy: Secondary | ICD-10-CM | POA: Diagnosis not present

## 2020-06-18 DIAGNOSIS — K922 Gastrointestinal hemorrhage, unspecified: Secondary | ICD-10-CM | POA: Diagnosis present

## 2020-06-18 DIAGNOSIS — K264 Chronic or unspecified duodenal ulcer with hemorrhage: Secondary | ICD-10-CM | POA: Diagnosis not present

## 2020-06-18 DIAGNOSIS — Z7982 Long term (current) use of aspirin: Secondary | ICD-10-CM | POA: Diagnosis not present

## 2020-06-18 DIAGNOSIS — K2951 Unspecified chronic gastritis with bleeding: Secondary | ICD-10-CM | POA: Diagnosis not present

## 2020-06-18 DIAGNOSIS — R059 Cough, unspecified: Secondary | ICD-10-CM

## 2020-06-18 DIAGNOSIS — D649 Anemia, unspecified: Secondary | ICD-10-CM

## 2020-06-18 DIAGNOSIS — I10 Essential (primary) hypertension: Secondary | ICD-10-CM

## 2020-06-18 DIAGNOSIS — K449 Diaphragmatic hernia without obstruction or gangrene: Secondary | ICD-10-CM | POA: Diagnosis not present

## 2020-06-18 DIAGNOSIS — I779 Disorder of arteries and arterioles, unspecified: Secondary | ICD-10-CM

## 2020-06-18 DIAGNOSIS — K2289 Other specified disease of esophagus: Secondary | ICD-10-CM | POA: Diagnosis not present

## 2020-06-18 DIAGNOSIS — E785 Hyperlipidemia, unspecified: Secondary | ICD-10-CM

## 2020-06-18 DIAGNOSIS — Z8542 Personal history of malignant neoplasm of other parts of uterus: Secondary | ICD-10-CM | POA: Insufficient documentation

## 2020-06-18 DIAGNOSIS — D62 Acute posthemorrhagic anemia: Secondary | ICD-10-CM | POA: Insufficient documentation

## 2020-06-18 DIAGNOSIS — Z950 Presence of cardiac pacemaker: Secondary | ICD-10-CM | POA: Insufficient documentation

## 2020-06-18 LAB — COMPREHENSIVE METABOLIC PANEL
ALT: 11 U/L (ref 0–44)
AST: 19 U/L (ref 15–41)
Albumin: 3.4 g/dL — ABNORMAL LOW (ref 3.5–5.0)
Alkaline Phosphatase: 58 U/L (ref 38–126)
Anion gap: 6 (ref 5–15)
BUN: 49 mg/dL — ABNORMAL HIGH (ref 8–23)
CO2: 25 mmol/L (ref 22–32)
Calcium: 9 mg/dL (ref 8.9–10.3)
Chloride: 105 mmol/L (ref 98–111)
Creatinine, Ser: 0.77 mg/dL (ref 0.44–1.00)
GFR, Estimated: >60 mL/min (ref >60)
Glucose, Bld: 126 mg/dL — ABNORMAL HIGH (ref 70–99)
Potassium: 3.9 mmol/L (ref 3.5–5.1)
Sodium: 136 mmol/L (ref 135–145)
Total Bilirubin: 0.7 mg/dL (ref 0.3–1.2)
Total Protein: 5.8 g/dL — ABNORMAL LOW (ref 6.5–8.1)

## 2020-06-18 LAB — CBC
HCT: 33.6 % — ABNORMAL LOW (ref 36.0–46.0)
Hemoglobin: 11.3 g/dL — ABNORMAL LOW (ref 12.0–15.0)
MCH: 30.7 pg (ref 26.0–34.0)
MCHC: 33.6 g/dL (ref 30.0–36.0)
MCV: 91.3 fL (ref 80.0–100.0)
Platelets: 195 10*3/uL (ref 150–400)
RBC: 3.68 MIL/uL — ABNORMAL LOW (ref 3.87–5.11)
RDW: 13.8 % (ref 11.5–15.5)
WBC: 10.6 10*3/uL — ABNORMAL HIGH (ref 4.0–10.5)
nRBC: 0 % (ref 0.0–0.2)

## 2020-06-18 LAB — POC OCCULT BLOOD, ED: Fecal Occult Bld: POSITIVE — AB

## 2020-06-18 LAB — TYPE AND SCREEN
ABO/RH(D): A POS
Antibody Screen: NEGATIVE

## 2020-06-18 MED ORDER — PANTOPRAZOLE SODIUM 40 MG IV SOLR
40.0000 mg | INTRAVENOUS | Status: AC
  Administered 2020-06-18: 40 mg via INTRAVENOUS
  Filled 2020-06-18: qty 40

## 2020-06-18 MED ORDER — PANTOPRAZOLE SODIUM 40 MG IV SOLR
40.0000 mg | Freq: Two times a day (BID) | INTRAVENOUS | Status: DC
  Administered 2020-06-19 – 2020-06-20 (×3): 40 mg via INTRAVENOUS
  Filled 2020-06-18 (×3): qty 40

## 2020-06-18 MED ORDER — HYDROXYZINE HCL 25 MG PO TABS
50.0000 mg | ORAL_TABLET | Freq: Three times a day (TID) | ORAL | Status: DC | PRN
  Administered 2020-06-19 – 2020-06-20 (×2): 50 mg via ORAL
  Filled 2020-06-18 (×2): qty 2

## 2020-06-18 MED ORDER — ACETAMINOPHEN 650 MG RE SUPP: 650.0000 mg | Freq: Four times a day (QID) | RECTAL | Status: DC | PRN

## 2020-06-18 MED ORDER — SODIUM CHLORIDE 0.9 % IV SOLN: INTRAVENOUS | Status: AC

## 2020-06-18 MED ORDER — SIMVASTATIN 20 MG PO TABS
20.0000 mg | ORAL_TABLET | Freq: Every day | ORAL | Status: DC
Start: 2020-06-19 — End: 2020-06-20
  Administered 2020-06-19: 20 mg via ORAL
  Filled 2020-06-18: qty 1

## 2020-06-18 MED ORDER — ACETAMINOPHEN 325 MG PO TABS
650.0000 mg | ORAL_TABLET | Freq: Four times a day (QID) | ORAL | Status: DC | PRN
  Administered 2020-06-19 – 2020-06-20 (×2): 650 mg via ORAL
  Filled 2020-06-18 (×2): qty 2

## 2020-06-18 NOTE — ED Notes (Signed)
Pt called 3x no answer  

## 2020-06-18 NOTE — ED Triage Notes (Signed)
Pt arrives to ED with chief complaint of black tarry stools that started yesterday. She has been feeling fatigue and weak for 2 weeks.

## 2020-06-18 NOTE — H&P (Signed)
History and Physical    [name]:[id number] DOB: 1935-05-12 DOA: 06/18/2020  PCP: Leonard Downing, MD Patient coming from: Home  Chief Complaint: Black stools  HPI: [name] is a 85 y.o. female with medical history significant of hypertension, hyperlipidemia, carotid artery disease, high-grade AV block status post PPM, severe aortic stenosis status post TAVR, history of endometrial adenocarcinoma in 2011 presenting with a chief complaint of black stools.  Patient states for the past 2 weeks she is feeling weak and having chills.  Since yesterday her stools are black tarry in color.  She has not vomited blood.  Denies abdominal pain.  Denies history of GI bleed.  Denies ethanol or NSAID use.  States she had a colonoscopy done about 10 or 15 years ago but has never had endoscopy done in the past.  Reports having chronic nasal congestion and cough, no recent change.  Denies shortness of breath or chest pain.  She is vaccinated against COVID.  ED Course: Hemodynamically stable.  Rectal exam notable for melenic stool.  FOBT positive.  Labs showing WBC 10.6, hemoglobin 11.3, platelet count 195K.  Sodium 136, potassium 3.9, chloride 105, bicarb 25, BUN 49, creatinine 0.7, glucose 126.  LFTs normal. Patient was given IV Protonix 40 mg.  Review of Systems:  All systems reviewed and apart from history of presenting illness, are negative.  Past Medical History:  Diagnosis Date  . Anxiety    Stable  . ASCUS on Pap smear 11/15/09  . Carotid artery disease (HCC)    1-39% RICA stenosis and 93-81% LICA stenosis on pre TAVR dopplers   . Depression   . Endometrial polyp 11/15/2009  . Endometrioid adenocarcinoma   . H/O varicella   . Hemorrhoids   . History of measles, mumps, or rubella   . Hypercholesterolemia   . Hypertension   . Menopausal symptoms 06/13/10  . Ovarian cyst 11/15/09  . PMB (postmenopausal bleeding) 11/15/09  . Presence of permanent cardiac pacemaker 10/20/2018  .  Vaginal bleeding     Past Surgical History:  Procedure Laterality Date  . BREAST SURGERY     Over twenty - five  years ago  . CATARACT EXTRACTION W/ INTRAOCULAR LENS IMPLANT Bilateral   . CERVICAL BIOPSY    . EYE SURGERY    . INSERT / REPLACE / REMOVE PACEMAKER  10/20/2018  . LAPAROSCOPIC ASSISTED VAGINAL HYSTERECTOMY  12/2009   BSO  . MULTIPLE EXTRACTIONS WITH ALVEOLOPLASTY N/A 12/09/2018   Procedure: EXTRACTION OF TOOTH #'S 6,21-29 AND 32 WITH ALVEOLOPLASTY AND BILATERAL MANDIBULAR TORI REDUCTIONS;  Surgeon: Lenn Cal, DDS;  Location: [place];  Service: Oral Surgery;  Laterality: N/A;  . PACEMAKER IMPLANT N/A 10/20/2018   Procedure: PACEMAKER IMPLANT;  Surgeon: Deboraha Sprang, MD;  Location: [place] [facility];  Service: Cardiovascular;  Laterality: N/A;  . RIGHT/LEFT HEART CATH AND CORONARY ANGIOGRAPHY N/A 11/24/2018   Procedure: RIGHT/LEFT HEART CATH AND CORONARY ANGIOGRAPHY;  Surgeon: Burnell Blanks, MD;  Location: [place] [facility];  Service: Cardiovascular;  Laterality: N/A;  . TEE WITHOUT CARDIOVERSION N/A 12/21/2018   Procedure: TRANSESOPHAGEAL ECHOCARDIOGRAM (TEE);  Surgeon: Burnell Blanks, MD;  Location: [place];  Service: Open Heart Surgery;  Laterality: N/A;     reports that she has been smoking cigarettes. She has a 27.50 pack-year smoking history. She has never used smokeless tobacco. She reports previous drug use. She reports that she does not drink alcohol.  No Known Allergies  Family History  Problem Relation Age of Onset  . Lung cancer Brother   . Heart disease Brother   . Heart failure Mother   . Parkinson's disease Father     Prior to Admission medications   Medication Sig Start Date End Date Taking? Authorizing Provider  acetaminophen (TYLENOL) 500 MG tablet Take 500 mg by mouth at bedtime.    [provider]  amLODipine (NORVASC) 10 MG tablet Take 1 tablet by mouth once daily 04/02/20   Deboraha Sprang, MD  amoxicillin (AMOXIL)  500 MG tablet Take 4 capsules (2,000 mg) one hour prior to all dental visits. 12/29/18   Eileen Stanford, PA-C  aspirin 81 MG chewable tablet Chew 1 tablet (81 mg total) by mouth daily. 12/23/18   Barrett, Lodema Hong, PA-C  carboxymethylcellulose (REFRESH PLUS) 0.5 % SOLN Place 1 drop into both eyes daily as needed (dry eyes).     [provider]  Cholecalciferol (VITAMIN D-3) 125 MCG (5000 UT) TABS Take 5,000 Units by mouth daily at 2 PM.    [provider]  citalopram (CELEXA) 40 MG tablet Take 20 mg by mouth daily at 2 PM.     [provider]  hydrOXYzine (VISTARIL) 50 MG capsule Take 1 capsule (50 mg total) by mouth 3 (three) times daily as needed for anxiety. 11/25/18   Eileen Stanford, PA-C  simvastatin (ZOCOR) 20 MG tablet Take 20 mg by mouth daily at 6 PM.     [provider]  vitamin B-12 (CYANOCOBALAMIN) 500 MCG tablet Take 500 mcg by mouth daily at 2 PM.    [provider]  vitamin E (VITAMIN E) 180 MG (400 UNITS) capsule Take 400 Units by mouth daily.    [provider]    Physical Exam: Vitals:   06/18/20 2200 06/18/20 2215 06/18/20 2230 06/18/20 2245  BP: (!) 116/51 132/62 139/60 115/63  Pulse: 94 69 97 91  Resp: 17 18 (!) 22 18  SpO2: 95% 95% 92% 95%    Physical Exam Constitutional:      General: She is not in acute distress. HENT:     Head: Normocephalic and atraumatic.  Eyes:     Extraocular Movements: Extraocular movements intact.     Conjunctiva/sclera: Conjunctivae normal.  Cardiovascular:     Rate and Rhythm: Normal rate and regular rhythm.     Pulses: Normal pulses.  Pulmonary:     Effort: Pulmonary effort is normal. No respiratory distress.     Breath sounds: Normal breath sounds. No wheezing or rales.  Abdominal:     General: Bowel sounds are normal. There is no distension.     Palpations: Abdomen is soft.     Tenderness: There is no abdominal tenderness.  Musculoskeletal:        General: No swelling  or tenderness.     Cervical back: Normal range of motion and neck supple.  Skin:    General: Skin is warm and dry.  Neurological:     General: No focal deficit present.     Mental Status: She is alert and oriented to person, place, and time.     Labs on Admission: I have personally reviewed following labs and imaging studies  CBC: Recent Labs  Lab 06/18/20 1750  WBC 10.6*  HGB 11.3*  HCT 33.6*  MCV 91.3  PLT 916   Basic Metabolic Panel: Recent Labs  Lab 06/18/20 1750  NA 136  K 3.9  CL 105  CO2 25  GLUCOSE 126*  BUN 49*  CREATININE 0.77  CALCIUM 9.0   GFR: CrCl cannot be calculated (Unknown ideal weight.). Liver Function Tests: Recent Labs  Lab 06/18/20 1750  AST 19  ALT 11  ALKPHOS 58  BILITOT 0.7  PROT 5.8*  ALBUMIN 3.4*   No results for input(s): LIPASE, AMYLASE in the last 168 hours. No results for input(s): AMMONIA in the last 168 hours. Coagulation Profile: No results for input(s): INR, PROTIME in the last 168 hours. Cardiac Enzymes: No results for input(s): CKTOTAL, CKMB, CKMBINDEX, TROPONINI in the last 168 hours. BNP (last 3 results) No results for input(s): PROBNP in the last 8760 hours. HbA1C: No results for input(s): HGBA1C in the last 72 hours. CBG: No results for input(s): GLUCAP in the last 168 hours. Lipid Profile: No results for input(s): CHOL, HDL, LDLCALC, TRIG, CHOLHDL, LDLDIRECT in the last 72 hours. Thyroid Function Tests: No results for input(s): TSH, T4TOTAL, FREET4, T3FREE, THYROIDAB in the last 72 hours. Anemia Panel: No results for input(s): VITAMINB12, FOLATE, FERRITIN, TIBC, IRON, RETICCTPCT in the last 72 hours. Urine analysis:    Component Value Date/Time   COLORURINE YELLOW 12/17/2018 [place] 12/17/2018 1422   LABSPEC 1.018 12/17/2018 1422   PHURINE 5.0 12/17/2018 [place] 12/17/2018 1422   HGBUR NEGATIVE 12/17/2018 1422   BILIRUBINUR NEGATIVE 12/17/2018 1422   KETONESUR 5 (A)  12/17/2018 1422   PROTEINUR NEGATIVE 12/17/2018 1422   NITRITE NEGATIVE 12/17/2018 1422   LEUKOCYTESUR NEGATIVE 12/17/2018 1422    Radiological Exams on Admission: No results found.  EKG: Independently reviewed.  Interpretation limited secondary to paced rhythm.  Assessment/Plan Principal Problem:   GI bleed Active Problems:   Acute blood loss anemia   HTN (hypertension)   HLD (hyperlipidemia)   Carotid arterial disease (HCC)   Acute blood loss anemia secondary to suspected acute upper GI bleed: Rectal exam notable for melenic stool.  FOBT positive.  Hemoglobin 11.3, was ranging between 12-13 in September 2020; no recent labs for comparison.  No hematemesis.  Abdominal exam benign.  Hemodynamically stable.  Patient denies ethanol or NSAID use.  Denies history of prior GI bleed.  No prior EGD results in the chart.  She takes aspirin 81 mg daily at home but is not on any anticoagulants.  -IV fluid hydration.  Continue IV Protonix 40 mg every 12 hours.  Type and screen, monitor H&H.  If there is significant drop in hemoglobin on repeat labs, give blood transfusion -discussed with the patient and she has given verbal consent.  She is endorsing generalized weakness but no chest pain or shortness of breath.  EGD needs to be done for further evaluation. I have sent a secure chat message to Dr. Collene Mares and Dr. Benson Norway who are on-call for GI requesting consultation in the morning.  Keep n.p.o. after midnight.  Hold home aspirin.  Mild leukocytosis, chills: Patient reports having chills for the past 2 weeks.  She is nontoxic-appearing.  No temperature recorded in the ED.  WBC count only mildly elevated at 11.3.  No tachycardia or hypotension to suggest sepsis.  History of ongoing cigarette smoking and reports chronic cough.  Lungs clear on exam.  No dyspnea or hypoxia.  Not endorsing any UTI symptoms. -Check temperature.  Check lactate, procalcitonin, and TSH levels.  Repeat labs in the morning to check WBC  count.  Hypertension -Not hypertensive.  Holding antihypertensives given concern for acute GI bleed.  Hyperlipidemia -Continue statin  Carotid artery disease -Hold  aspirin given acute GI bleed.  Continue statin.  DVT prophylaxis: SCDs Code Status: Patient wishes to be DNR. Family Communication: Patient's adopted daughter was at bedside.  Patient and daughter requested that I also update patient's niece Lattie Haw over the phone which has been done. Disposition Plan: Status is: Observation  The patient remains OBS appropriate and will d/c before 2 midnights.  Dispo: The patient is from: Home              Anticipated d/c is to: Home              Patient currently is not medically stable to d/c.   Difficult to place patient No  Level of care: Level of care: Telemetry Medical   The medical decision making on this patient was of high complexity and the patient is at high risk for clinical deterioration, therefore this is a level 3 visit.  Shela Leff MD [facility]  If 7PM-7AM, please contact night-coverage [email or web address]  06/18/2020, 11:12 PM

## 2020-06-18 NOTE — ED Provider Notes (Signed)
[facility] EMERGENCY DEPARTMENT Provider Note   CSN: [id number] Arrival date & time: 06/18/20  1649     History Chief Complaint  Patient presents with   GI Bleeding    [name] is a 85 y.o. female.  HPI   This patient is an 85 year old female, she has a known history of hypertension and hypercholesterolemia, she states that she has never had any problem with gastrointestinal bleeding or bleeding complications.  She does not take any anticoagulants and a review of her medical history shows that she does take amlodipine, a baby aspirin and simvastatin.  The patient reports that approximately 2 days ago she started to develop some black stools, there was been a little bit of abdominal discomfort but totally relieved with bowel movements.  She has also been dizzy and feeling increasingly weak today.  She has no upper abdominal pain, no history of alcohol intake, no history of anti-inflammatory use other than the baby aspirin.  She does take Tylenol when she needs something for pain.  She does not recall ever having an upper endoscopy, she has no history of significant lower GI bleeding and has not had any nosebleeds.  She is not using Pepto-Bismol, not taking iron supplements and has never had black stools.  She is accompanied by a friend who also adds additional history that she has been extremely weak today much more than usual and is concerned about the black stools.  Review of the medical record shows that the patient does have a history of a pacemaker in the left upper chest secondary to a complete heart block, this was placed about a year and a half ago.  Review of the lab work shows that the patient's hemoglobin is approximately 1 g lower than it was a year ago  Past Medical History:  Diagnosis Date   Anxiety    Stable   ASCUS on Pap smear 11/15/09   Carotid artery disease (HCC)    1-39% RICA stenosis and 59-93% LICA stenosis on pre TAVR dopplers     Depression    Endometrial polyp 11/15/2009   Endometrioid adenocarcinoma    H/O varicella    Hemorrhoids    History of measles, mumps, or rubella    Hypercholesterolemia    Hypertension    Menopausal symptoms 06/13/10   Ovarian cyst 11/15/09   PMB (postmenopausal bleeding) 11/15/09   Presence of permanent cardiac pacemaker 10/20/2018   Vaginal bleeding     Patient Active Problem List   Diagnosis Date Noted   GI bleed 06/18/2020   Acute blood loss anemia 06/18/2020   HTN (hypertension) 06/18/2020   HLD (hyperlipidemia) 06/18/2020   Carotid arterial disease ([place]) 06/18/2020   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   Severe aortic valve stenosis    Complete heart block ([place]) 10/20/2018   Endometrial cancer ([place]) 06/12/2011    Past Surgical History:  Procedure Laterality Date   BIOPSY  06/19/2020   Procedure: BIOPSY;  Surgeon: Irving Copas., MD;  Location: [facility] ENDOSCOPY;  Service: Gastroenterology;;   BREAST SURGERY     Over twenty - five  years ago   CATARACT EXTRACTION W/ INTRAOCULAR LENS IMPLANT Bilateral    CERVICAL BIOPSY     ESOPHAGOGASTRODUODENOSCOPY (EGD) WITH PROPOFOL N/A 06/19/2020   Procedure: ESOPHAGOGASTRODUODENOSCOPY (EGD) WITH PROPOFOL;  Surgeon: Irving Copas., MD;  Location: [facility] ENDOSCOPY;  Service: Gastroenterology;  Laterality: N/A;   EYE SURGERY     INSERT / REPLACE / REMOVE PACEMAKER  10/20/2018   LAPAROSCOPIC ASSISTED VAGINAL HYSTERECTOMY  12/2009   BSO   MULTIPLE EXTRACTIONS WITH ALVEOLOPLASTY N/A 12/09/2018   Procedure: EXTRACTION OF TOOTH #'S 7,37-10 AND 32 WITH ALVEOLOPLASTY AND BILATERAL MANDIBULAR TORI REDUCTIONS;  Surgeon: Lenn Cal, DDS;  Location: [place];  Service: Oral Surgery;  Laterality: N/A;   PACEMAKER IMPLANT N/A 10/20/2018   Procedure: PACEMAKER IMPLANT;  Surgeon: Deboraha Sprang, MD;  Location: [place] [facility];  Service: Cardiovascular;  Laterality: N/A;    RIGHT/LEFT HEART CATH AND CORONARY ANGIOGRAPHY N/A 11/24/2018   Procedure: RIGHT/LEFT HEART CATH AND CORONARY ANGIOGRAPHY;  Surgeon: Burnell Blanks, MD;  Location: [place] [facility];  Service: Cardiovascular;  Laterality: N/A;   TEE WITHOUT CARDIOVERSION N/A 12/21/2018   Procedure: TRANSESOPHAGEAL ECHOCARDIOGRAM (TEE);  Surgeon: Burnell Blanks, MD;  Location: [place];  Service: Open Heart Surgery;  Laterality: N/A;     OB History   No obstetric history on file.     Family History  Problem Relation Age of Onset   Lung cancer Brother    Heart disease Brother    Heart failure Mother    Parkinson's disease Father     Social History   Tobacco Use   Smoking status: Current Every Day Smoker    Packs/day: 0.50    Years: 55.00    Pack years: 27.50    Types: Cigarettes   Smokeless tobacco: Never Used   Tobacco comment: 3 cig/day  Vaping Use   Vaping Use: Never used  Substance Use Topics   Alcohol use: No   Drug use: Not Currently    Home Medications Prior to Admission medications   Medication Sig Start Date End Date Taking? Authorizing Provider  acetaminophen (TYLENOL) 500 MG tablet Take 500 mg by mouth at bedtime.   Yes [provider]  amoxicillin (AMOXIL) 500 MG tablet Take 4 capsules (2,000 mg) one hour prior to all dental visits. 12/29/18  Yes Eileen Stanford, PA-C  aspirin 81 MG chewable tablet Chew 1 tablet (81 mg total) by mouth daily. 12/23/18  Yes Barrett, Erin R, PA-C  carboxymethylcellulose (REFRESH PLUS) 0.5 % SOLN Place 1 drop into both eyes daily as needed (dry eyes).    Yes [provider]  Cholecalciferol (VITAMIN D-3) 125 MCG (5000 UT) TABS Take 5,000 Units by mouth daily.   Yes [provider]  hydrOXYzine (VISTARIL) 50 MG capsule Take 1 capsule (50 mg total) by mouth 3 (three) times daily as needed for anxiety. 11/25/18  Yes Eileen Stanford, PA-C  OVER THE COUNTER MEDICATION Apply 1 application topically  daily. Cortisone 10 to scalp   Yes [provider]  Salicylic Acid (GOLD BOND PSORIASIS RELIEF EX) Apply 1 application topically daily. scalp   Yes [provider]  simvastatin (ZOCOR) 20 MG tablet Take 20 mg by mouth daily.   Yes [provider]  vitamin B-12 (CYANOCOBALAMIN) 500 MCG tablet Take 500 mcg by mouth daily.   Yes [provider]  vitamin E 180 MG (400 UNITS) capsule Take 400 Units by mouth daily.   Yes [provider]  pantoprazole (PROTONIX) 40 MG tablet 40 mg BID x 4 weeks then daily 06/20/20   Eulogio Bear U, DO  sucralfate (CARAFATE) 1 GM/10ML suspension Take 10 mLs (1 g total) by mouth 4 (four) times daily -  with meals and at bedtime. 06/20/20   Geradine Girt, DO    Allergies    Patient has no known allergies.  Review of Systems   Review of Systems  All other systems reviewed and are negative.   Physical Exam Updated Vital Signs BP 137/60 (BP Location: Right Arm)    Pulse 75    Temp 98 F (36.7 C) (Oral)    Resp 20    Ht 1.651 m (5\' 5" )    Wt 47.6 kg    SpO2 93%    BMI 17.47 kg/m   Physical Exam Vitals and nursing note reviewed.  Constitutional:      General: She is not in acute distress.    Appearance: She is well-developed.  HENT:     Head: Normocephalic and atraumatic.     Mouth/Throat:     Pharynx: No oropharyngeal exudate.  Eyes:     General: No scleral icterus.       Right eye: No discharge.        Left eye: No discharge.     Conjunctiva/sclera: Conjunctivae normal.     Pupils: Pupils are equal, round, and reactive to light.  Neck:     Thyroid: No thyromegaly.     Vascular: No JVD.  Cardiovascular:     Rate and Rhythm: Normal rate and regular rhythm.     Heart sounds: Normal heart sounds. No murmur heard. No friction rub. No gallop.   Pulmonary:     Effort: Pulmonary effort is normal. No respiratory distress.     Breath sounds: Normal breath sounds. No wheezing or rales.  Abdominal:     General:  Bowel sounds are normal. There is no distension.     Palpations: Abdomen is soft. There is no mass.     Tenderness: There is no abdominal tenderness.  Musculoskeletal:        General: No tenderness. Normal range of motion.     Cervical back: Normal range of motion and neck supple.  Lymphadenopathy:     Cervical: No cervical adenopathy.  Skin:    General: Skin is warm and dry.     Findings: No erythema or rash.  Neurological:     Mental Status: She is alert.     Coordination: Coordination normal.  Psychiatric:        Behavior: Behavior normal.     ED Results / Procedures / Treatments   Labs (all labs ordered are listed, but only abnormal results are displayed) Labs Reviewed  COMPREHENSIVE METABOLIC PANEL - Abnormal; Notable for the following components:      Result Value   Glucose, Bld 126 (*)    BUN 49 (*)    Total Protein 5.8 (*)    Albumin 3.4 (*)    All other components within normal limits  CBC - Abnormal; Notable for the following components:   WBC 10.6 (*)    RBC 3.68 (*)    Hemoglobin 11.3 (*)    HCT 33.6 (*)    All other components within normal limits  CBC - Abnormal; Notable for the following components:   RBC 3.40 (*)    Hemoglobin 10.3 (*)    HCT 31.3 (*)    All other components within normal limits  HEMOGLOBIN AND HEMATOCRIT, BLOOD - Abnormal; Notable for the following components:   Hemoglobin 9.2 (*)    HCT 28.0 (*)    All other components within normal limits  CBC - Abnormal; Notable for the following components:   RBC 2.96 (*)    Hemoglobin 9.1 (*)    HCT 26.8 (*)    Platelets 139 (*)  All other components within normal limits  BASIC METABOLIC PANEL - Abnormal; Notable for the following components:   Potassium 3.2 (*)    Calcium 8.5 (*)    All other components within normal limits  POC OCCULT BLOOD, ED - Abnormal; Notable for the following components:   Fecal Occult Bld POSITIVE (*)    All other components within normal limits  SARS  CORONAVIRUS 2 (TAT 6-24 HRS)  TSH  PROCALCITONIN  LACTIC ACID, PLASMA  MAGNESIUM  TYPE AND SCREEN  SURGICAL PATHOLOGY    EKG EKG Interpretation  Date/Time:  Monday June 18 2020 21:02:31 EDT Ventricular Rate:  91 PR Interval:    QRS Duration: 156 QT Interval:  418 QTC Calculation: 515 R Axis:   -78 Text Interpretation: VENTRICULAR PACED RHYTHM since last tracing no significant change Confirmed by Noemi Chapel [phone]) on 06/18/2020 9:32:53 PM   Radiology No results found.  Procedures Procedures   Medications Ordered in ED Medications  0.9 %  sodium chloride infusion (0 mLs Intravenous Stopped 06/19/20 1900)  pantoprazole (PROTONIX) injection 40 mg (40 mg Intravenous Given 06/18/20 2201)    ED Course  I have reviewed the triage vital signs and the nursing notes.  Pertinent labs & imaging results that were available during my care of the patient were reviewed by me and considered in my medical decision making (see chart for details).    MDM Rules/Calculators/A&P                          This patient's exam is unremarkable, she does have some dark-colored stool which is Hemoccult positive.  Labs do show some anemia, I suspect that she has an upper GI bleed, she likely would benefit from an upper endoscopy, will start with Protonix and have the patient admitted to the hospital for endoscopy tomorrow.  Otherwise patient's blood pressure is 132/49 with a pulse of 90.  With chaperone present rectal exam was performed, there was an excessive amount of soft melanotic stool in the rectal vault which was immediately Hemoccult positive.  BUN elevated compared with Cr also c/w UGI bleed  We will page hospitalist for admission  Final Clinical Impression(s) / ED Diagnoses Final diagnoses:  Upper GI bleed  Anemia, unspecified type    Rx / DC Orders ED Discharge Orders         Ordered    pantoprazole (PROTONIX) 40 MG tablet        06/20/20 0938    sucralfate (CARAFATE) 1  GM/10ML suspension  3 times daily with meals & bedtime        06/20/20 0938    Increase activity slowly        06/20/20 4627    Diet - low sodium heart healthy        06/20/20 0350    Discharge instructions       Comments: Carafate can be constipating so please be sure to use a bowel regimen to avoid this such as miralax CBC Monday Hold norvasc for now-- check BP at home and resume if SBP (top #) is >140   06/20/20 0938           Noemi Chapel, MD 06/21/20 1459

## 2020-06-19 ENCOUNTER — Other Ambulatory Visit: Payer: Self-pay

## 2020-06-19 ENCOUNTER — Encounter (HOSPITAL_COMMUNITY)
Admission: EM | Disposition: A | Discharge status: Home / Self Care | Payer: Self-pay | Source: Home / Self Care | Attending: Emergency Medicine

## 2020-06-19 ENCOUNTER — Observation Stay (HOSPITAL_COMMUNITY): Payer: Medicare HMO

## 2020-06-19 ENCOUNTER — Observation Stay (HOSPITAL_COMMUNITY): Payer: Medicare HMO | Admitting: Anesthesiology

## 2020-06-19 ENCOUNTER — Encounter (HOSPITAL_COMMUNITY): Payer: Self-pay | Admitting: Internal Medicine

## 2020-06-19 DIAGNOSIS — D62 Acute posthemorrhagic anemia: Secondary | ICD-10-CM

## 2020-06-19 DIAGNOSIS — B9681 Helicobacter pylori [H. pylori] as the cause of diseases classified elsewhere: Secondary | ICD-10-CM | POA: Insufficient documentation

## 2020-06-19 DIAGNOSIS — K922 Gastrointestinal hemorrhage, unspecified: Secondary | ICD-10-CM | POA: Diagnosis not present

## 2020-06-19 DIAGNOSIS — R195 Other fecal abnormalities: Secondary | ICD-10-CM

## 2020-06-19 DIAGNOSIS — Z20822 Contact with and (suspected) exposure to covid-19: Secondary | ICD-10-CM | POA: Diagnosis not present

## 2020-06-19 DIAGNOSIS — R799 Abnormal finding of blood chemistry, unspecified: Secondary | ICD-10-CM | POA: Diagnosis not present

## 2020-06-19 DIAGNOSIS — K269 Duodenal ulcer, unspecified as acute or chronic, without hemorrhage or perforation: Secondary | ICD-10-CM

## 2020-06-19 DIAGNOSIS — K921 Melena: Secondary | ICD-10-CM | POA: Diagnosis not present

## 2020-06-19 DIAGNOSIS — K264 Chronic or unspecified duodenal ulcer with hemorrhage: Secondary | ICD-10-CM | POA: Diagnosis not present

## 2020-06-19 DIAGNOSIS — K2951 Unspecified chronic gastritis with bleeding: Secondary | ICD-10-CM | POA: Diagnosis not present

## 2020-06-19 HISTORY — PX: BIOPSY: SHX5522

## 2020-06-19 HISTORY — PX: ESOPHAGOGASTRODUODENOSCOPY (EGD) WITH PROPOFOL: SHX5813

## 2020-06-19 LAB — CBC
HCT: 31.3 % — ABNORMAL LOW (ref 36.0–46.0)
Hemoglobin: 10.3 g/dL — ABNORMAL LOW (ref 12.0–15.0)
MCH: 30.3 pg (ref 26.0–34.0)
MCHC: 32.9 g/dL (ref 30.0–36.0)
MCV: 92.1 fL (ref 80.0–100.0)
Platelets: 159 10*3/uL (ref 150–400)
RBC: 3.4 MIL/uL — ABNORMAL LOW (ref 3.87–5.11)
RDW: 13.7 % (ref 11.5–15.5)
WBC: 6.7 10*3/uL (ref 4.0–10.5)
nRBC: 0 % (ref 0.0–0.2)

## 2020-06-19 LAB — PROCALCITONIN: Procalcitonin: <0.1 ng/mL

## 2020-06-19 LAB — TSH: TSH: 1.932 u[IU]/mL (ref 0.350–4.500)

## 2020-06-19 LAB — LACTIC ACID, PLASMA: Lactic Acid, Venous: 1.2 mmol/L (ref 0.5–1.9)

## 2020-06-19 LAB — HEMOGLOBIN AND HEMATOCRIT, BLOOD
HCT: 28 % — ABNORMAL LOW (ref 36.0–46.0)
Hemoglobin: 9.2 g/dL — ABNORMAL LOW (ref 12.0–15.0)

## 2020-06-19 LAB — SARS CORONAVIRUS 2 (TAT 6-24 HRS): SARS Coronavirus 2: NEGATIVE

## 2020-06-19 SURGERY — ESOPHAGOGASTRODUODENOSCOPY (EGD) WITH PROPOFOL
Anesthesia: Monitor Anesthesia Care

## 2020-06-19 MED ORDER — SUCRALFATE 1 GM/10ML PO SUSP
1.0000 g | Freq: Three times a day (TID) | ORAL | Status: DC
  Administered 2020-06-19 – 2020-06-20 (×4): 1 g via ORAL
  Filled 2020-06-19 (×4): qty 10

## 2020-06-19 MED ORDER — LIDOCAINE 2% (20 MG/ML) 5 ML SYRINGE
INTRAMUSCULAR | Status: DC | PRN
  Administered 2020-06-19: 50 mg via INTRAVENOUS

## 2020-06-19 MED ORDER — LACTATED RINGERS IV SOLN: INTRAVENOUS | Status: DC | PRN

## 2020-06-19 MED ORDER — PROPOFOL 10 MG/ML IV BOLUS
INTRAVENOUS | Status: DC | PRN
  Administered 2020-06-19: 50 mg via INTRAVENOUS
  Administered 2020-06-19: 25 mg via INTRAVENOUS

## 2020-06-19 MED ORDER — PROPOFOL 500 MG/50ML IV EMUL
INTRAVENOUS | Status: DC | PRN
  Administered 2020-06-19: 150 ug/kg/min via INTRAVENOUS

## 2020-06-19 MED ORDER — SODIUM CHLORIDE 0.9 % IV SOLN: INTRAVENOUS | Status: DC

## 2020-06-19 SURGICAL SUPPLY — 15 items

## 2020-06-19 NOTE — ED Notes (Signed)
Consent signed for procedure at bedside.

## 2020-06-19 NOTE — Anesthesia Procedure Notes (Addendum)
Procedure Name: MAC Date/Time: 06/19/2020 3:41 PM Performed by: Renato Shin, CRNA Pre-anesthesia Checklist: Patient identified, Emergency Drugs available, Suction available and Patient being monitored Patient Re-evaluated:Patient Re-evaluated prior to induction Oxygen Delivery Method: Nasal cannula Preoxygenation: Pre-oxygenation with 100% oxygen Induction Type: IV induction Airway Equipment and Method: Bite block Placement Confirmation: positive ETCO2 and breath sounds checked- equal and bilateral Dental Injury: Teeth and Oropharynx as per pre-operative assessment

## 2020-06-19 NOTE — ED Notes (Signed)
Admitting at bedside, then pt will be transported upstairs to floor.

## 2020-06-19 NOTE — ED Notes (Addendum)
Valli Glance (friend) would like update when patient is gets bed upstairs. [phone]

## 2020-06-19 NOTE — Transfer of Care (Signed)
Immediate Anesthesia Transfer of Care Note  Patient: [name]  Procedure(s) Performed: ESOPHAGOGASTRODUODENOSCOPY (EGD) WITH PROPOFOL (N/A ) BIOPSY  Patient Location: PACU and Endoscopy Unit  Anesthesia Type:MAC  Level of Consciousness: drowsy and patient cooperative  Airway & Oxygen Therapy: Patient Spontanous Breathing and Patient connected to nasal cannula oxygen  Post-op Assessment: Report given to RN and Post -op Vital signs reviewed and stable  Post vital signs: Reviewed and stable  Last Vitals:  Vitals Value Taken Time  BP 104/45 06/19/20 1552  Temp    Pulse 86 06/19/20 1552  Resp 24 06/19/20 1552  SpO2 99 % 06/19/20 1552  Vitals shown include unvalidated device data.  Last Pain:  Vitals:   06/19/20 1500  TempSrc: Temporal  PainSc: 0-No pain         Complications: No complications documented.

## 2020-06-19 NOTE — Consult Note (Signed)
Referring Provider: Dr. Shela Leff Primary Care Physician:  Leonard Downing, MD Primary Gastroenterologist:  Dr. Silvano Rusk   Reason for Consultation:  UGI bleed, melena   HPI: [name] is a 85 y.o. female with a past medical history of anxiety, depression, hypertension, hyperlipidemia, complete heart block s/p pacemaker, severe aortic stenosis s/p TAVR 11/2018, carotid artery disease and endometrial adeno carcinoma in 2011.  She presented to [facility] ED on 06/18/2020 with complaints of generalized weakness with chills for 2 weeks then started passing black tarry stools on Sunday 06/17/2020.  She described passing small volumes of soft to loose black tarry stools x 6 - 7 episodes on 3/20  and x 2  episodes on 06/18/2020. She felt light headed and weak in the knees. No associated nausea or vomiting.  No upper or lower abdominal pain.  No prior history of GI bleed.  She has intermittent diarrhea which occurs once or twice monthly without specific food or stress triggers.  She underwent a colonoscopy in 2012 which showed severe diverticulosis in the sigmoid colon and external hemorrhoids.  She is on ASA 81mg  once daily. No other NSAID use.  She complains of fatigue with intermittent shortness of breath for the past 2 to 3 months.  She has a productive cough consisting of white sputum for the past few weeks.  No hemoptysis.  No chest pain or palpitations.  She smokes 1 pack of cigarettes every other day for 40+ years.  No alcohol use.  Labs in the ED showed a hemoglobin level of 11.3 (baseline Hg 12.1 on 12/22/2018).  BUN 49 (baseline BUN 13).  Hematocrit 33.6.  Platelet 195.  FOBT positive.  SARS coronavirus 2 pending.  A chest x-ray has not been done.  She had a small smear of melena in her depend since arriving to the ED. Her friend is at the bedside.  She has 2 nieces who live nearby.   ECHO 12/08/2019: 1. Left ventricular ejection fraction, by estimation, is 55 to 60%. The  left ventricle has normal function. The left ventricle has no regional wall motion abnormalities. Left ventricular diastolic parameters are consistent with Grade I diastolic dysfunction (impaired relaxation). 2. Peak RV-RA gradient 29 mmHg. Right ventricular systolic function is normal. The right ventricular size is normal. 3. Bioprosthetic aortic valve s/p TAVR, 23 mm Edwards Sapien valve. Mean gradient 10 mmHg. No significant bioprosthetic valve stenosis or regurgitation. 4. The mitral valve is normal in structure. Trivial mitral valve regurgitation. No evidence of mitral stenosis. 5. The IVC was not visualized. 6. Technically difficult study with poor acoustic windows.  Pacemaker implant 10/20/2018:  Colonoscopy by Dr. Carlean Purl 05/03/2010: 1) Severe diverticulosis in the sigmoid colon - with angulation and stenosis 2) External hemorrhoids/hypertrophied anal papillae 3) Otherwise normal examination, excellent prep   Past Medical History:  Diagnosis Date  . Anxiety    Stable  . ASCUS on Pap smear 11/15/09  . Carotid artery disease (HCC)    1-39% RICA stenosis and 16-96% LICA stenosis on pre TAVR dopplers   . Depression   . Endometrial polyp 11/15/2009  . Endometrioid adenocarcinoma   . H/O varicella   . Hemorrhoids   . History of measles, mumps, or rubella   . Hypercholesterolemia   . Hypertension   . Menopausal symptoms 06/13/10  . Ovarian cyst 11/15/09  . PMB (postmenopausal bleeding) 11/15/09  . Presence of permanent cardiac pacemaker 10/20/2018  . Vaginal bleeding     Past Surgical History:  Procedure Laterality Date  . BREAST SURGERY     Over twenty - five  years ago  . CATARACT EXTRACTION W/ INTRAOCULAR LENS IMPLANT Bilateral   . CERVICAL BIOPSY    . EYE SURGERY    . INSERT / REPLACE / REMOVE PACEMAKER  10/20/2018  . LAPAROSCOPIC ASSISTED VAGINAL HYSTERECTOMY  12/2009   BSO  . MULTIPLE EXTRACTIONS WITH ALVEOLOPLASTY N/A 12/09/2018   Procedure: EXTRACTION OF TOOTH  #'S 6,21-29 AND 32 WITH ALVEOLOPLASTY AND BILATERAL MANDIBULAR TORI REDUCTIONS;  Surgeon: Lenn Cal, DDS;  Location: [place];  Service: Oral Surgery;  Laterality: N/A;  . PACEMAKER IMPLANT N/A 10/20/2018   Procedure: PACEMAKER IMPLANT;  Surgeon: Deboraha Sprang, MD;  Location: [place] [facility];  Service: Cardiovascular;  Laterality: N/A;  . RIGHT/LEFT HEART CATH AND CORONARY ANGIOGRAPHY N/A 11/24/2018   Procedure: RIGHT/LEFT HEART CATH AND CORONARY ANGIOGRAPHY;  Surgeon: Burnell Blanks, MD;  Location: [place] [facility];  Service: Cardiovascular;  Laterality: N/A;  . TEE WITHOUT CARDIOVERSION N/A 12/21/2018   Procedure: TRANSESOPHAGEAL ECHOCARDIOGRAM (TEE);  Surgeon: Burnell Blanks, MD;  Location: [place];  Service: Open Heart Surgery;  Laterality: N/A;    Prior to Admission medications   Medication Sig Start Date End Date Taking? Authorizing Provider  acetaminophen (TYLENOL) 500 MG tablet Take 500 mg by mouth at bedtime.   Yes [provider]  amLODipine (NORVASC) 10 MG tablet Take 1 tablet by mouth once daily 04/02/20  Yes Deboraha Sprang, MD  amoxicillin (AMOXIL) 500 MG tablet Take 4 capsules (2,000 mg) one hour prior to all dental visits. 12/29/18  Yes Eileen Stanford, PA-C  aspirin 81 MG chewable tablet Chew 1 tablet (81 mg total) by mouth daily. 12/23/18  Yes Barrett, Erin R, PA-C  carboxymethylcellulose (REFRESH PLUS) 0.5 % SOLN Place 1 drop into both eyes daily as needed (dry eyes).    Yes [provider]  Cholecalciferol (VITAMIN D-3) 125 MCG (5000 UT) TABS Take 5,000 Units by mouth daily.   Yes [provider]  hydrOXYzine (VISTARIL) 50 MG capsule Take 1 capsule (50 mg total) by mouth 3 (three) times daily as needed for anxiety. 11/25/18  Yes Eileen Stanford, PA-C  OVER THE COUNTER MEDICATION Apply 1 application topically daily. Cortisone 10 to scalp   Yes [provider]  Salicylic Acid (GOLD BOND PSORIASIS RELIEF EX) Apply 1  application topically daily. scalp   Yes [provider]  simvastatin (ZOCOR) 20 MG tablet Take 20 mg by mouth daily.   Yes [provider]  vitamin B-12 (CYANOCOBALAMIN) 500 MCG tablet Take 500 mcg by mouth daily.   Yes [provider]  vitamin E 180 MG (400 UNITS) capsule Take 400 Units by mouth daily.   Yes [provider]    Current Facility-Administered Medications  Medication Dose Route Frequency Provider Last Rate Last Admin  . 0.9 %  sodium chloride infusion   Intravenous Continuous Shela Leff, MD 125 mL/hr at 06/19/20 0055 New Bag at 06/19/20 0055  . acetaminophen (TYLENOL) tablet 650 mg  650 mg Oral Q6H PRN Shela Leff, MD   650 mg at 06/19/20 0140   Or  . acetaminophen (TYLENOL) suppository 650 mg  650 mg Rectal Q6H PRN Shela Leff, MD      . hydrOXYzine (ATARAX/VISTARIL) tablet 50 mg  50 mg Oral TID PRN Shela Leff, MD   50 mg at 06/19/20 0048  . pantoprazole (PROTONIX) injection 40 mg  40 mg Intravenous Q12H  Shela Leff, MD      . simvastatin (ZOCOR) tablet 20 mg  20 mg Oral q1800 Shela Leff, MD       Current Outpatient Medications  Medication Sig Dispense Refill  . acetaminophen (TYLENOL) 500 MG tablet Take 500 mg by mouth at bedtime.    Marland Kitchen amLODipine (NORVASC) 10 MG tablet Take 1 tablet by mouth once daily 90 tablet 3  . amoxicillin (AMOXIL) 500 MG tablet Take 4 capsules (2,000 mg) one hour prior to all dental visits. 12 tablet 8  . aspirin 81 MG chewable tablet Chew 1 tablet (81 mg total) by mouth daily.    . carboxymethylcellulose (REFRESH PLUS) 0.5 % SOLN Place 1 drop into both eyes daily as needed (dry eyes).     . Cholecalciferol (VITAMIN D-3) 125 MCG (5000 UT) TABS Take 5,000 Units by mouth daily.    . hydrOXYzine (VISTARIL) 50 MG capsule Take 1 capsule (50 mg total) by mouth 3 (three) times daily as needed for anxiety.    Marland Kitchen OVER THE COUNTER MEDICATION Apply 1 application topically daily.  Cortisone 10 to scalp    . Salicylic Acid (GOLD BOND PSORIASIS RELIEF EX) Apply 1 application topically daily. scalp    . simvastatin (ZOCOR) 20 MG tablet Take 20 mg by mouth daily.    . vitamin B-12 (CYANOCOBALAMIN) 500 MCG tablet Take 500 mcg by mouth daily.    . vitamin E 180 MG (400 UNITS) capsule Take 400 Units by mouth daily.      Allergies as of 06/18/2020  . (No Known Allergies)    Family History  Problem Relation Age of Onset  . Lung cancer Brother   . Heart disease Brother   . Heart failure Mother   . Parkinson's disease Father     Social History   Socioeconomic History  . Marital status: Legally Separated    Spouse name: Not on file  . Number of children: Not on file  . Years of education: Not on file  . Highest education level: Not on file  Occupational History  . Occupation: Retired-sales rep  Tobacco Use  . Smoking status: Current Every Day Smoker    Packs/day: 0.50    Years: 55.00    Pack years: 27.50    Types: Cigarettes  . Smokeless tobacco: Never Used  . Tobacco comment: 3 cig/day  Vaping Use  . Vaping Use: Never used  Substance and Sexual Activity  . Alcohol use: No  . Drug use: Not Currently  . Sexual activity: Not on file  Other Topics Concern  . Not on file  Social History Narrative  . Not on file   Social Determinants of Health   Financial Resource Strain: Not on file  Food Insecurity: Not on file  Transportation Needs: Not on file  Physical Activity: Not on file  Stress: Not on file  Social Connections: Not on file  Intimate Partner Violence: Not on file    Review of Systems: Gen: + Chills.  CV: Denies chest pain, palpitations or edema. Resp: See HPI. GI: See HPI. GU : + Urinary incontinence.  No dysuria or hematuria. MS: + Generalized weakness. Derm: Denies rash, itchiness, skin lesions or unhealing ulcers. Psych: Denies depression, anxiety or memory loss. Heme: Denies easy bruising, bleeding. Neuro:  + light headedness.   Endo:  Denies any problems with DM, thyroid or adrenal function.  Physical Exam: Vital signs in last 24 hours: Temp:  [98.8 F (37.1 C)] 98.8 F (37.1 C) (03/21 2326)  Pulse Rate:  [69-119] 85 (03/22 0730) Resp:  [17-29] 21 (03/22 0730) BP: (97-148)/(46-122) 120/61 (03/22 0730) SpO2:  [91 %-96 %] 95 % (03/22 0730)   General: Alert 85 year old female in no acute distress. Head:  Normocephalic and atraumatic. Eyes:  No scleral icterus. Conjunctiva pink. Ears:  Normal auditory acuity. Nose:  No deformity, discharge or lesions. Mouth:  Dentition intact. No ulcers or lesions.  Neck:  Supple. No lymphadenopathy or thyromegaly.  Lungs: Breath sounds diminished throughout. Chest: Pacemaker generator to left subclavian area.  Heart: Regular rate and rhythm, no murmurs. Abdomen: Soft, nondistended.  Nontender.  Positive bowel sounds all 4 quadrants. Rectal: Melenic stool as documented by the ED physician. Musculoskeletal:  Symmetrical without gross deformities.  Pulses:  Normal pulses noted. Extremities:  Without clubbing or edema. Neurologic:  Alert and  oriented x4. No focal deficits.  Skin:  Intact without significant lesions or rashes. Psych:  Alert and cooperative. Normal mood and affect.  Intake/Output from previous day: No intake/output data recorded. Intake/Output this shift: No intake/output data recorded.  Lab Results: Recent Labs    06/18/20 1750 06/19/20 0202  WBC 10.6* 6.7  HGB 11.3* 10.3*  HCT 33.6* 31.3*  PLT 195 159   BMET Recent Labs    06/18/20 1750  NA 136  K 3.9  CL 105  CO2 25  GLUCOSE 126*  BUN 49*  CREATININE 0.77  CALCIUM 9.0   LFT Recent Labs    06/18/20 1750  PROT 5.8*  ALBUMIN 3.4*  AST 19  ALT 11  ALKPHOS 58  BILITOT 0.7   PT/INR No results for input(s): LABPROT, INR in the last 72 hours. Hepatitis Panel No results for input(s): HEPBSAG, HCVAB, HEPAIGM, HEPBIGM in the last 72 hours.    Studies/Results: No results  found.  IMPRESSION/PLAN:  68.  85 year old female with upper GI bleed/melena and anemia. No abdominal pain. Hg 11.3 (baseline Hg 12.1) -> 10.3.  BUN 49.  -NPO -EGD benefits and risks discussed including risk with sedation, risk of bleeding, perforation and infection.  EGD this afternoon if time available in Endo otherwise will need to be scheduled tomorrow. Await [facility] Coronavirus 2 test result.  -IV fluids per the hospitalist  -Repeat H/H at noon -Monitory the patient closely for active GI bleeding  -Continue Pantoprazole IV 40 mg IV every 12 hours, change to PPI infusion if she demonstrates active melena -Further recommendations per Dr. Rush Landmark  2. History of severe AS s/pTAVR 11/2018  3. History of complete heart block s/p pacemaker   4.  History of carotid artery disease on ASA  5. History of endometrial cancer   6. Cough. Sars Coronavirus 2 test pending.  -Stat chest xray   Patrecia Pour [facility]  06/19/2020, 7:51 AM

## 2020-06-19 NOTE — Op Note (Signed)
[facility] Patient Name: [name] Procedure Date : 06/19/2020 MRN: [id number] Attending MD: Justice Britain , MD Date of Birth: [birth year] CSN: [id number] Age: 85 Admit Type: Inpatient Procedure:                Upper GI endoscopy Indications:              Diagnostic procedure, Acute post hemorrhagic                            anemia, Melena, Occult blood in stool, Recent                            gastrointestinal bleeding, Exclusion of ulcer of                            the GI tract Providers:                Justice Britain, MD, Vista Lawman, RN, Tyna Jaksch Technician Referring MD:             Noralyn Pick, [facility], Gatha Mayer, MD Medicines:                Monitored Anesthesia Care Complications:            No immediate complications. Estimated Blood Loss:     Estimated blood loss was minimal. Procedure:                Pre-Anesthesia Assessment:                           - Prior to the procedure, a History and Physical                            was performed, and patient medications and                            allergies were reviewed. The patient's tolerance of                            previous anesthesia was also reviewed. The risks                            and benefits of the procedure and the sedation                            options and risks were discussed with the patient.                            All questions were answered, and informed consent                            was  obtained. Prior Anticoagulants: The patient has                            taken no previous anticoagulant or antiplatelet                            agents except for aspirin. ASA Grade Assessment:                            III - A patient with severe systemic disease. After                            reviewing the risks and benefits, the patient was                             deemed in satisfactory condition to undergo the                            procedure.                           After obtaining informed consent, the endoscope was                            passed under direct vision. Throughout the                            procedure, the patient's blood pressure, pulse, and                            oxygen saturations were monitored continuously. The                            GIF-H190 ([id number] Olympus gastroscope was                            introduced through the mouth, and advanced to the                            second part of duodenum. The upper GI endoscopy was                            accomplished without difficulty. The patient                            tolerated the procedure. Scope In: Scope Out: Findings:      No gross lesions were noted in the entire esophagus.      The Z-line was irregular and was found 38 cm from the incisors.      A 3 cm hiatal hernia was present.      Patchy moderate inflammation characterized by erosions, erythema,       friability and granularity was found in the entire examined stomach       (mostly in the body and antrum). Biopsies were taken with a cold forceps  for histology and Helicobacter pylori testing.      One non-bleeding cratered duodenal ulcer with a clean ulcer base       (Forrest Class III) was found in the duodenal bulb on the posterior       wall. The lesion was 15 mm in largest dimension.      No other gross lesions were noted in the duodenal bulb, in the first       portion of the duodenum and in the second portion of the duodenum. Impression:               - No gross lesions in esophagus. Z-line irregular,                            38 cm from the incisors.                           - 3 cm hiatal hernia.                           - Gastritis. Biopsied.                           - Non-bleeding duodenal ulcer with a clean ulcer                            base (Forrest Class III) in  the bulb.                           - No other gross lesions in the duodenal bulb, in                            the first portion of the duodenum and in the second                            portion of the duodenum. Recommendation:           - The patient will be observed post-procedure,                            until all discharge criteria are met.                           - Return patient to hospital ward for ongoing care.                           - Continue IV PPI BID x48 hours total. Then may                            transition to PO PPI BID 40 mg.                           - Carafate QAC + QHS for 61-monthto aid in healing                            of PUD.                           -  Observe patient's clinical course.                           - Trend Hgb/Hct.                           - OK to maintain Aspirin with restart tomorrow for                            heart health in setting of TAVR and underlying CHF.                            However, she should not be on Aspirin without PPI                            therapy in future to decrease risk of recurrence.                           - No other NSAIDs at this time.                           - Await pathology results. Treat HP if positive.                           - Return to GI clinic as needed.                           - Clear liquid diet today and if does well into the                            evening then tomorrow morning can be advanced to a                            regular diet.                           - Repeat upper endoscopy in 4 months to check                            healing.                           - If concern for transfusion dependent anemia                            develops then repeat EGD and evaluate for any                            active sources of bleeding.                           - The findings and recommendations were discussed                            with  the patient.                            - The findings and recommendations were discussed                            with the referring physician. Procedure Code(s):        --- Professional ---                           [phone], Esophagogastroduodenoscopy, flexible,                            transoral; with biopsy, single or multiple Diagnosis Code(s):        --- Professional ---                           K22.8, Other specified diseases of esophagus                           K29.70, Gastritis, unspecified, without bleeding                           K26.9, Duodenal ulcer, unspecified as acute or                            chronic, without hemorrhage or perforation                           D62, Acute posthemorrhagic anemia                           K92.1, Melena (includes Hematochezia)                           R19.5, Other fecal abnormalities                           K92.2, Gastrointestinal hemorrhage, unspecified CPT copyright 2019 American Medical Association. All rights reserved. The codes documented in this report are preliminary and upon coder review may  be revised to meet current compliance requirements. Justice Britain, MD 06/19/2020 3:56:48 PM Number of Addenda: 0

## 2020-06-19 NOTE — Anesthesia Postprocedure Evaluation (Signed)
Anesthesia Post Note  Patient: [name]  Procedure(s) Performed: ESOPHAGOGASTRODUODENOSCOPY (EGD) WITH PROPOFOL (N/A ) BIOPSY     Patient location during evaluation: Endoscopy Anesthesia Type: MAC Level of consciousness: awake and alert Pain management: pain level controlled Vital Signs Assessment: post-procedure vital signs reviewed and stable Respiratory status: spontaneous breathing, nonlabored ventilation and respiratory function stable Cardiovascular status: stable and blood pressure returned to baseline Postop Assessment: no apparent nausea or vomiting Anesthetic complications: no   No complications documented.  Last Vitals:  Vitals:   06/19/20 1600 06/19/20 1610  BP: (!) 124/50 102/66  Pulse: 82 84  Resp: (!) 23 19  Temp:    SpO2: 97% 98%    Last Pain:  Vitals:   06/19/20 1610  TempSrc:   PainSc: 0-No pain                 Reilley Valentine,W. EDMOND

## 2020-06-19 NOTE — Progress Notes (Addendum)
PROGRESS NOTE    [name]  HDQ:[id number] DOB: May 01, 1935 DOA: 06/18/2020 PCP: Leonard Downing, MD   Brief Narrative:  [name] is a 85 y.o. female with medical history significant of hypertension, hyperlipidemia, carotid artery disease, high-grade AV block status post PPM, severe aortic stenosis status post TAVR, history of endometrial adenocarcinoma in 2011 presenting with a chief complaint of black stools.  Patient was admitted with acute blood loss anemia secondary to suspected acute upper GI bleed.  GI planning for endoscopy later this afternoon.  Plan to continue on PPI IV twice daily.  Assessment & Plan:   Principal Problem:   GI bleed Active Problems:   Acute blood loss anemia   HTN (hypertension)   HLD (hyperlipidemia)   Carotid arterial disease (HCC)  Acute blood loss anemia secondary to suspected acute upper GI bleed: Rectal exam notable for melenic stool.  FOBT positive.  Hemoglobin 11.3, was ranging between 12-13 in September 2020; no recent labs for comparison.  No hematemesis.  Abdominal exam benign.  Hemodynamically stable.  Patient denies ethanol or NSAID use.  Denies history of prior GI bleed.  No prior EGD results in the chart.  She takes aspirin 81 mg daily at home but is not on any anticoagulants.  -IV fluid hydration.  Continue IV Protonix 40 mg every 12 hours.  Type and screen, monitor H&H.  If there is significant drop in hemoglobin on repeat labs, give blood transfusion -discussed with the patient and she has given verbal consent.  She is endorsing generalized weakness but no chest pain or shortness of breath.  -GI following with plans for EGD today versus tomorrow -Continue IV PPI twice daily  Mild leukocytosis, chills: Patient reports having chills for the past 2 weeks.  She is nontoxic-appearing.  No temperature recorded in the ED.  WBC count only mildly elevated at 11.3.  No tachycardia or hypotension to suggest sepsis.  History of ongoing  cigarette smoking and reports chronic cough.  Lungs clear on exam.  No dyspnea or hypoxia.  Not endorsing any UTI symptoms. -Patient currently afebrile and leukocytosis appears to be resolved.  TSH 1.932.  Procalcitonin low. -No concern for active infection at this time.  Hypertension -Not hypertensive.  Holding antihypertensives given concern for acute GI bleed.  Hyperlipidemia -Continue statin  Carotid artery disease -Hold aspirin given acute GI bleed.  Continue statin.   DVT prophylaxis: SCDs Code Status: Full Family Communication: Discussed with adopted daughter at bedside Disposition Plan:  Status is: Observation  The patient will require care spanning > 2 midnights and should be moved to inpatient because: Ongoing diagnostic testing needed not appropriate for outpatient work up, IV treatments appropriate due to intensity of illness or inability to take PO and Inpatient level of care appropriate due to severity of illness  Dispo: The patient is from: Home              Anticipated d/c is to: Home              Patient currently is not medically stable to d/c.   Difficult to place patient No  Consultants:   GI  Procedures:   See below  Antimicrobials:   None   Subjective: Patient seen and evaluated today with no new acute complaints or concerns. No acute concerns or events noted overnight.  She denies abdominal pain, nausea, or vomiting.  States that her last bowel movement was yesterday.  Objective: Vitals:   06/19/20 0930 06/19/20 0945  06/19/20 1015 06/19/20 1045  BP: 125/74 134/65 126/74 118/79  Pulse: 84 81 84 84  Resp: 18 (!) 22 19 (!) 22  Temp:      SpO2: 97% 97% 94% 96%  Weight:      Height:       No intake or output data in the 24 hours ending 06/19/20 1301 Filed Weights   06/19/20 0858  Weight: 47.6 kg    Examination:  General exam: Appears calm and comfortable  Respiratory system: Clear to auscultation. Respiratory effort  normal. Cardiovascular system: S1 & S2 heard, RRR.  Gastrointestinal system: Abdomen is soft Central nervous system: Alert and awake Extremities: No edema Skin: No significant lesions noted Psychiatry: Flat affect.    Data Reviewed: I have personally reviewed following labs and imaging studies  CBC: Recent Labs  Lab 06/18/20 1750 06/19/20 0202  WBC 10.6* 6.7  HGB 11.3* 10.3*  HCT 33.6* 31.3*  MCV 91.3 92.1  PLT 195 818   Basic Metabolic Panel: Recent Labs  Lab 06/18/20 1750  NA 136  K 3.9  CL 105  CO2 25  GLUCOSE 126*  BUN 49*  CREATININE 0.77  CALCIUM 9.0   GFR: Estimated Creatinine Clearance: 40.7 mL/min (by C-G formula based on SCr of 0.77 mg/dL). Liver Function Tests: Recent Labs  Lab 06/18/20 1750  AST 19  ALT 11  ALKPHOS 58  BILITOT 0.7  PROT 5.8*  ALBUMIN 3.4*   No results for input(s): LIPASE, AMYLASE in the last 168 hours. No results for input(s): AMMONIA in the last 168 hours. Coagulation Profile: No results for input(s): INR, PROTIME in the last 168 hours. Cardiac Enzymes: No results for input(s): CKTOTAL, CKMB, CKMBINDEX, TROPONINI in the last 168 hours. BNP (last 3 results) No results for input(s): PROBNP in the last 8760 hours. HbA1C: No results for input(s): HGBA1C in the last 72 hours. CBG: No results for input(s): GLUCAP in the last 168 hours. Lipid Profile: No results for input(s): CHOL, HDL, LDLCALC, TRIG, CHOLHDL, LDLDIRECT in the last 72 hours. Thyroid Function Tests: Recent Labs    06/19/20 0202  TSH 1.932   Anemia Panel: No results for input(s): VITAMINB12, FOLATE, FERRITIN, TIBC, IRON, RETICCTPCT in the last 72 hours. Sepsis Labs: Recent Labs  Lab 06/18/20 2343  PROCALCITON <0.10  LATICACIDVEN 1.2    Recent Results (from the past 240 hour(s))  SARS CORONAVIRUS 2 (TAT 6-24 HRS) Nasopharyngeal Nasopharyngeal Swab     Status: None   Collection Time: 06/19/20  4:21 AM   Specimen: Nasopharyngeal Swab  Result Value  Ref Range Status   SARS Coronavirus 2 NEGATIVE NEGATIVE Final    Comment: (NOTE) SARS-CoV-2 target nucleic acids are NOT DETECTED.  The SARS-CoV-2 RNA is generally detectable in upper and lower respiratory specimens during the acute phase of infection. Negative results do not preclude SARS-CoV-2 infection, do not rule out co-infections with other pathogens, and should not be used as the sole basis for treatment or other patient management decisions. Negative results must be combined with clinical observations, patient history, and epidemiological information. The expected result is Negative.  Fact Sheet for Patients: SugarRoll.be  Fact Sheet for Healthcare Providers: https://www.woods-mathews.com/  This test is not yet approved or cleared by the [place] FDA and  has been authorized for detection and/or diagnosis of SARS-CoV-2 by FDA under an Emergency Use Authorization (EUA). This EUA will remain  in effect (meaning this test can be used) for the duration of the COVID-19 declaration under Se ction 564(b)(1)  of the Act, 21 U.S.C. section 360bbb-3(b)(1), unless the authorization is terminated or revoked sooner.  Performed at [facility], [place] [address] [place], [place] [address]          Radiology Studies: DG Chest 1 View  Result Date: 06/19/2020 CLINICAL DATA:  Black tarry stool. EXAM: CHEST  1 VIEW COMPARISON:  12/21/2018 FINDINGS: Left chest wall pacer device is noted with leads in the right atrial appendage and right ventricle. Heart size appears normal. Aortic atherosclerosis. No pleural effusion or edema. No airspace opacities identified. The visualized osseous structures are unremarkable. IMPRESSION: No active cardiopulmonary abnormalities. Electronically Signed   By: Kerby Moors M.D.   On: 06/19/2020 10:09        Scheduled Meds: . pantoprazole (PROTONIX) IV  40 mg Intravenous Q12H  . simvastatin  20 mg  Oral q1800   Continuous Infusions:   LOS: 0 days    Time spent: 35 minutes    Elih Mooney Darleen Crocker, DO [facility]  If 7PM-7AM, please contact night-coverage [email or web address] 06/19/2020, 1:01 PM

## 2020-06-19 NOTE — Anesthesia Preprocedure Evaluation (Signed)
Anesthesia Evaluation  Patient identified by MRN, date of birth, ID band Patient awake    Reviewed: Allergy & Precautions, H&P , NPO status , Patient's Chart, lab work & pertinent test results  Airway Mallampati: II  TM Distance: >3 FB Neck ROM: Full    Dental no notable dental hx. (+) Edentulous Upper, Edentulous Lower, Dental Advisory Given   Pulmonary Current Smoker and Patient abstained from smoking.,    Pulmonary exam normal breath sounds clear to auscultation       Cardiovascular hypertension, Pt. on medications + dysrhythmias + pacemaker  Rhythm:Regular Rate:Normal     Neuro/Psych Anxiety Depression negative neurological ROS     GI/Hepatic negative GI ROS, Neg liver ROS,   Endo/Other  negative endocrine ROS  Renal/GU negative Renal ROS  negative genitourinary   Musculoskeletal   Abdominal   Peds  Hematology  (+) Blood dyscrasia, anemia ,   Anesthesia Other Findings   Reproductive/Obstetrics negative OB ROS                             Anesthesia Physical Anesthesia Plan  ASA: III  Anesthesia Plan: MAC   Post-op Pain Management:    Induction: Intravenous  PONV Risk Score and Plan: 1 and Propofol infusion  Airway Management Planned: Simple Face Mask  Additional Equipment:   Intra-op Plan:   Post-operative Plan:   Informed Consent: I have reviewed the patients History and Physical, chart, labs and discussed the procedure including the risks, benefits and alternatives for the proposed anesthesia with the patient or authorized representative who has indicated his/her understanding and acceptance.   Patient has DNR.  Discussed DNR with patient and Suspend DNR.   Dental advisory given  Plan Discussed with: CRNA  Anesthesia Plan Comments:         Anesthesia Quick Evaluation

## 2020-06-19 NOTE — H&P (View-Only) (Signed)
PROGRESS NOTE    [name]  KWI:[id number] DOB: December 01, 1935 DOA: 06/18/2020 PCP: Leonard Downing, MD   Brief Narrative:  [name] is a 85 y.o. female with medical history significant of hypertension, hyperlipidemia, carotid artery disease, high-grade AV block status post PPM, severe aortic stenosis status post TAVR, history of endometrial adenocarcinoma in 2011 presenting with a chief complaint of black stools.  Patient was admitted with acute blood loss anemia secondary to suspected acute upper GI bleed.  GI planning for endoscopy later this afternoon.  Plan to continue on PPI IV twice daily.  Assessment & Plan:   Principal Problem:   GI bleed Active Problems:   Acute blood loss anemia   HTN (hypertension)   HLD (hyperlipidemia)   Carotid arterial disease (HCC)  Acute blood loss anemia secondary to suspected acute upper GI bleed: Rectal exam notable for melenic stool.  FOBT positive.  Hemoglobin 11.3, was ranging between 12-13 in September 2020; no recent labs for comparison.  No hematemesis.  Abdominal exam benign.  Hemodynamically stable.  Patient denies ethanol or NSAID use.  Denies history of prior GI bleed.  No prior EGD results in the chart.  She takes aspirin 81 mg daily at home but is not on any anticoagulants.  -IV fluid hydration.  Continue IV Protonix 40 mg every 12 hours.  Type and screen, monitor H&H.  If there is significant drop in hemoglobin on repeat labs, give blood transfusion -discussed with the patient and she has given verbal consent.  She is endorsing generalized weakness but no chest pain or shortness of breath.  -GI following with plans for EGD today versus tomorrow -Continue IV PPI twice daily  Mild leukocytosis, chills: Patient reports having chills for the past 2 weeks.  She is nontoxic-appearing.  No temperature recorded in the ED.  WBC count only mildly elevated at 11.3.  No tachycardia or hypotension to suggest sepsis.  History of ongoing  cigarette smoking and reports chronic cough.  Lungs clear on exam.  No dyspnea or hypoxia.  Not endorsing any UTI symptoms. -Patient currently afebrile and leukocytosis appears to be resolved.  TSH 1.932.  Procalcitonin low. -No concern for active infection at this time.  Hypertension -Not hypertensive.  Holding antihypertensives given concern for acute GI bleed.  Hyperlipidemia -Continue statin  Carotid artery disease -Hold aspirin given acute GI bleed.  Continue statin.   DVT prophylaxis: SCDs Code Status: DNR Family Communication: Discussed with adopted daughter at bedside Disposition Plan:  Status is: Observation  The patient will require care spanning > 2 midnights and should be moved to inpatient because: Ongoing diagnostic testing needed not appropriate for outpatient work up, IV treatments appropriate due to intensity of illness or inability to take PO and Inpatient level of care appropriate due to severity of illness  Dispo: The patient is from: Home              Anticipated d/c is to: Home              Patient currently is not medically stable to d/c.   Difficult to place patient No  Consultants:   GI  Procedures:   See below  Antimicrobials:   None   Subjective: Patient seen and evaluated today with no new acute complaints or concerns. No acute concerns or events noted overnight.  She denies abdominal pain, nausea, or vomiting.  States that her last bowel movement was yesterday.  Objective: Vitals:   06/19/20 0930 06/19/20 0945  06/19/20 1015 06/19/20 1045  BP: 125/74 134/65 126/74 118/79  Pulse: 84 81 84 84  Resp: 18 (!) 22 19 (!) 22  Temp:      SpO2: 97% 97% 94% 96%  Weight:      Height:       No intake or output data in the 24 hours ending 06/19/20 1301 Filed Weights   06/19/20 0858  Weight: 47.6 kg    Examination:  General exam: Appears calm and comfortable  Respiratory system: Clear to auscultation. Respiratory effort  normal. Cardiovascular system: S1 & S2 heard, RRR.  Gastrointestinal system: Abdomen is soft Central nervous system: Alert and awake Extremities: No edema Skin: No significant lesions noted Psychiatry: Flat affect.    Data Reviewed: I have personally reviewed following labs and imaging studies  CBC: Recent Labs  Lab 06/18/20 1750 06/19/20 0202  WBC 10.6* 6.7  HGB 11.3* 10.3*  HCT 33.6* 31.3*  MCV 91.3 92.1  PLT 195 295   Basic Metabolic Panel: Recent Labs  Lab 06/18/20 1750  NA 136  K 3.9  CL 105  CO2 25  GLUCOSE 126*  BUN 49*  CREATININE 0.77  CALCIUM 9.0   GFR: Estimated Creatinine Clearance: 40.7 mL/min (by C-G formula based on SCr of 0.77 mg/dL). Liver Function Tests: Recent Labs  Lab 06/18/20 1750  AST 19  ALT 11  ALKPHOS 58  BILITOT 0.7  PROT 5.8*  ALBUMIN 3.4*   No results for input(s): LIPASE, AMYLASE in the last 168 hours. No results for input(s): AMMONIA in the last 168 hours. Coagulation Profile: No results for input(s): INR, PROTIME in the last 168 hours. Cardiac Enzymes: No results for input(s): CKTOTAL, CKMB, CKMBINDEX, TROPONINI in the last 168 hours. BNP (last 3 results) No results for input(s): PROBNP in the last 8760 hours. HbA1C: No results for input(s): HGBA1C in the last 72 hours. CBG: No results for input(s): GLUCAP in the last 168 hours. Lipid Profile: No results for input(s): CHOL, HDL, LDLCALC, TRIG, CHOLHDL, LDLDIRECT in the last 72 hours. Thyroid Function Tests: Recent Labs    06/19/20 0202  TSH 1.932   Anemia Panel: No results for input(s): VITAMINB12, FOLATE, FERRITIN, TIBC, IRON, RETICCTPCT in the last 72 hours. Sepsis Labs: Recent Labs  Lab 06/18/20 2343  PROCALCITON <0.10  LATICACIDVEN 1.2    Recent Results (from the past 240 hour(s))  SARS CORONAVIRUS 2 (TAT 6-24 HRS) Nasopharyngeal Nasopharyngeal Swab     Status: None   Collection Time: 06/19/20  4:21 AM   Specimen: Nasopharyngeal Swab  Result Value  Ref Range Status   SARS Coronavirus 2 NEGATIVE NEGATIVE Final    Comment: (NOTE) SARS-CoV-2 target nucleic acids are NOT DETECTED.  The SARS-CoV-2 RNA is generally detectable in upper and lower respiratory specimens during the acute phase of infection. Negative results do not preclude SARS-CoV-2 infection, do not rule out co-infections with other pathogens, and should not be used as the sole basis for treatment or other patient management decisions. Negative results must be combined with clinical observations, patient history, and epidemiological information. The expected result is Negative.  Fact Sheet for Patients: SugarRoll.be  Fact Sheet for Healthcare Providers: https://www.woods-mathews.com/  This test is not yet approved or cleared by the [place] FDA and  has been authorized for detection and/or diagnosis of SARS-CoV-2 by FDA under an Emergency Use Authorization (EUA). This EUA will remain  in effect (meaning this test can be used) for the duration of the COVID-19 declaration under Se ction 564(b)(1)  of the Act, 21 U.S.C. section 360bbb-3(b)(1), unless the authorization is terminated or revoked sooner.  Performed at [facility], [place] [address] [place], [place] [address]          Radiology Studies: DG Chest 1 View  Result Date: 06/19/2020 CLINICAL DATA:  Black tarry stool. EXAM: CHEST  1 VIEW COMPARISON:  12/21/2018 FINDINGS: Left chest wall pacer device is noted with leads in the right atrial appendage and right ventricle. Heart size appears normal. Aortic atherosclerosis. No pleural effusion or edema. No airspace opacities identified. The visualized osseous structures are unremarkable. IMPRESSION: No active cardiopulmonary abnormalities. Electronically Signed   By: Kerby Moors M.D.   On: 06/19/2020 10:09        Scheduled Meds: . pantoprazole (PROTONIX) IV  40 mg Intravenous Q12H  . simvastatin  20 mg  Oral q1800   Continuous Infusions:   LOS: 0 days    Time spent: 35 minutes    Annett Boxwell Darleen Crocker, DO [facility]  If 7PM-7AM, please contact night-coverage [email or web address] 06/19/2020, 1:01 PM

## 2020-06-19 NOTE — Interval H&P Note (Signed)
History and Physical Interval Note:  06/19/2020 2:58 PM  [name]  has presented today for surgery, with the diagnosis of Upper GI bleed, melena, anemia.  The various methods of treatment have been discussed with the patient and family. After consideration of risks, benefits and other options for treatment, the patient has consented to  Procedure(s): ESOPHAGOGASTRODUODENOSCOPY (EGD) WITH PROPOFOL (N/A) as a surgical intervention.  The patient's history has been reviewed, patient examined, no change in status, stable for surgery.  I have reviewed the patient's chart and labs.  Questions were answered to the patient's satisfaction.     [employer]

## 2020-06-20 ENCOUNTER — Telehealth: Payer: Self-pay | Admitting: Gastroenterology

## 2020-06-20 DIAGNOSIS — K922 Gastrointestinal hemorrhage, unspecified: Secondary | ICD-10-CM | POA: Diagnosis not present

## 2020-06-20 DIAGNOSIS — K269 Duodenal ulcer, unspecified as acute or chronic, without hemorrhage or perforation: Secondary | ICD-10-CM | POA: Diagnosis not present

## 2020-06-20 DIAGNOSIS — K921 Melena: Secondary | ICD-10-CM | POA: Diagnosis not present

## 2020-06-20 LAB — CBC
HCT: 26.8 % — ABNORMAL LOW (ref 36.0–46.0)
Hemoglobin: 9.1 g/dL — ABNORMAL LOW (ref 12.0–15.0)
MCH: 30.7 pg (ref 26.0–34.0)
MCHC: 34 g/dL (ref 30.0–36.0)
MCV: 90.5 fL (ref 80.0–100.0)
Platelets: 139 10*3/uL — ABNORMAL LOW (ref 150–400)
RBC: 2.96 MIL/uL — ABNORMAL LOW (ref 3.87–5.11)
RDW: 13.8 % (ref 11.5–15.5)
WBC: 4.2 10*3/uL (ref 4.0–10.5)
nRBC: 0 % (ref 0.0–0.2)

## 2020-06-20 LAB — BASIC METABOLIC PANEL
Anion gap: 5 (ref 5–15)
BUN: 15 mg/dL (ref 8–23)
CO2: 24 mmol/L (ref 22–32)
Calcium: 8.5 mg/dL — ABNORMAL LOW (ref 8.9–10.3)
Chloride: 109 mmol/L (ref 98–111)
Creatinine, Ser: 0.66 mg/dL (ref 0.44–1.00)
GFR, Estimated: >60 mL/min (ref >60)
Glucose, Bld: 80 mg/dL (ref 70–99)
Potassium: 3.2 mmol/L — ABNORMAL LOW (ref 3.5–5.1)
Sodium: 138 mmol/L (ref 135–145)

## 2020-06-20 LAB — MAGNESIUM: Magnesium: 1.8 mg/dL (ref 1.7–2.4)

## 2020-06-20 MED ORDER — SUCRALFATE 1 GM/10ML PO SUSP: 1.0000 g | Freq: Three times a day (TID) | ORAL | 0 refills | Status: DC

## 2020-06-20 MED ORDER — PANTOPRAZOLE SODIUM 40 MG PO TBEC: 40.0000 mg | DELAYED_RELEASE_TABLET | Freq: Two times a day (BID) | ORAL | Status: DC

## 2020-06-20 MED ORDER — PANTOPRAZOLE SODIUM 40 MG PO TBEC: DELAYED_RELEASE_TABLET | ORAL | 0 refills | Status: DC

## 2020-06-20 NOTE — Progress Notes (Signed)
Nsg Discharge Note  Admit Date:  06/18/2020 Discharge date: 06/20/2020   [name] to be D/C'd Home per MD order.  AVS completed.  Copy for chart, and copy for patient signed, and dated. Patient/caregiver able to verbalize understanding.  Discharge Medication: Allergies as of 06/20/2020   No Known Allergies     Medication List    STOP taking these medications   amLODipine 10 MG tablet Commonly known as: NORVASC     TAKE these medications   acetaminophen 500 MG tablet Commonly known as: TYLENOL Take 500 mg by mouth at bedtime.   amoxicillin 500 MG tablet Commonly known as: AMOXIL Take 4 capsules (2,000 mg) one hour prior to all dental visits.   aspirin 81 MG chewable tablet Chew 1 tablet (81 mg total) by mouth daily.   carboxymethylcellulose 0.5 % Soln Commonly known as: REFRESH PLUS Place 1 drop into both eyes daily as needed (dry eyes).   GOLD BOND PSORIASIS RELIEF EX Apply 1 application topically daily. scalp   hydrOXYzine 50 MG capsule Commonly known as: VISTARIL Take 1 capsule (50 mg total) by mouth 3 (three) times daily as needed for anxiety.   OVER THE COUNTER MEDICATION Apply 1 application topically daily. Cortisone 10 to scalp   pantoprazole 40 MG tablet Commonly known as: PROTONIX 40 mg BID x 4 weeks then daily   simvastatin 20 MG tablet Commonly known as: ZOCOR Take 20 mg by mouth daily.   sucralfate 1 GM/10ML suspension Commonly known as: CARAFATE Take 10 mLs (1 g total) by mouth 4 (four) times daily -  with meals and at bedtime.   vitamin B-12 500 MCG tablet Commonly known as: CYANOCOBALAMIN Take 500 mcg by mouth daily.   Vitamin D-3 125 MCG (5000 UT) Tabs Take 5,000 Units by mouth daily.   vitamin E 180 MG (400 UNITS) capsule Take 400 Units by mouth daily.       Discharge Assessment: Vitals:   06/19/20 2337 06/20/20 0416  BP: (!) 128/54 137/60  Pulse: 82 75  Resp: 20 20  Temp: 98.4 F (36.9 C) 98 F (36.7 C)  SpO2: 94% 93%    Skin clean, dry and intact without evidence of skin break down, no evidence of skin tears noted. IV catheter discontinued intact. Site without signs and symptoms of complications - no redness or edema noted at insertion site, patient denies c/o pain - only slight tenderness at site.  Dressing with slight pressure applied.  D/c Instructions-Education: Discharge instructions given to patient/family with verbalized understanding. D/c education completed with patient/family including follow up instructions, medication list, d/c activities limitations if indicated, with other d/c instructions as indicated by MD - patient able to verbalize understanding, all questions fully answered. Patient instructed to return to ED, call 911, or call MD for any changes in condition.  Patient escorted via [place], and D/C home via private auto.  Erasmo Leventhal, RN 06/20/2020 1:18 PM

## 2020-06-20 NOTE — H&P (Signed)
GASTROENTEROLOGY PROCEDURE H&P NOTE   Primary Care Physician: Leonard Downing, MD  HPI: [name] is a 85 y.o. female who presents for EGD for evaluation of melena/anemia/dark stools.  Past Medical History:  Diagnosis Date  . Anxiety    Stable  . ASCUS on Pap smear 11/15/09  . Carotid artery disease (HCC)    1-39% RICA stenosis and 78-29% LICA stenosis on pre TAVR dopplers   . Depression   . Endometrial polyp 11/15/2009  . Endometrioid adenocarcinoma   . H/O varicella   . Hemorrhoids   . History of measles, mumps, or rubella   . Hypercholesterolemia   . Hypertension   . Menopausal symptoms 06/13/10  . Ovarian cyst 11/15/09  . PMB (postmenopausal bleeding) 11/15/09  . Presence of permanent cardiac pacemaker 10/20/2018  . Vaginal bleeding    Past Surgical History:  Procedure Laterality Date  . BREAST SURGERY     Over twenty - five  years ago  . CATARACT EXTRACTION W/ INTRAOCULAR LENS IMPLANT Bilateral   . CERVICAL BIOPSY    . EYE SURGERY    . INSERT / REPLACE / REMOVE PACEMAKER  10/20/2018  . LAPAROSCOPIC ASSISTED VAGINAL HYSTERECTOMY  12/2009   BSO  . MULTIPLE EXTRACTIONS WITH ALVEOLOPLASTY N/A 12/09/2018   Procedure: EXTRACTION OF TOOTH #'S 6,21-29 AND 32 WITH ALVEOLOPLASTY AND BILATERAL MANDIBULAR TORI REDUCTIONS;  Surgeon: Lenn Cal, DDS;  Location: [facility];  Service: Oral Surgery;  Laterality: N/A;  . PACEMAKER IMPLANT N/A 10/20/2018   Procedure: PACEMAKER IMPLANT;  Surgeon: Deboraha Sprang, MD;  Location: [facility];  Service: Cardiovascular;  Laterality: N/A;  . RIGHT/LEFT HEART CATH AND CORONARY ANGIOGRAPHY N/A 11/24/2018   Procedure: RIGHT/LEFT HEART CATH AND CORONARY ANGIOGRAPHY;  Surgeon: Burnell Blanks, MD;  Location: [facility];  Service: Cardiovascular;  Laterality: N/A;  . TEE WITHOUT CARDIOVERSION N/A 12/21/2018   Procedure: TRANSESOPHAGEAL ECHOCARDIOGRAM (TEE);  Surgeon: Burnell Blanks, MD;  Location: [facility];   Service: Open Heart Surgery;  Laterality: N/A;   Current Facility-Administered Medications  Medication Dose Route Frequency Provider Last Rate Last Admin  . acetaminophen (TYLENOL) tablet 650 mg  650 mg Oral Q6H PRN Shela Leff, MD   650 mg at 06/20/20 0028   Or  . acetaminophen (TYLENOL) suppository 650 mg  650 mg Rectal Q6H PRN Shela Leff, MD      . hydrOXYzine (ATARAX/VISTARIL) tablet 50 mg  50 mg Oral TID PRN Shela Leff, MD   50 mg at 06/20/20 0028  . pantoprazole (PROTONIX) injection 40 mg  40 mg Intravenous Q12H Shela Leff, MD   40 mg at 06/20/20 0836  . simvastatin (ZOCOR) tablet 20 mg  20 mg Oral q1800 Shela Leff, MD   20 mg at 06/19/20 1654  . sucralfate (CARAFATE) 1 GM/10ML suspension 1 g  1 g Oral TID WC & HS Mansouraty, Telford Nab., MD   1 g at 06/20/20 0725   No Known Allergies Family History  Problem Relation Age of Onset  . Lung cancer Brother   . Heart disease Brother   . Heart failure Mother   . Parkinson's disease Father    Social History   Socioeconomic History  . Marital status: Legally Separated    Spouse name: Not on file  . Number of children: Not on file  . Years of education: Not on file  . Highest education level: Not on file  Occupational History  . Occupation: Retired-sales rep  Tobacco  Use  . Smoking status: Current Every Day Smoker    Packs/day: 0.50    Years: 55.00    Pack years: 27.50    Types: Cigarettes  . Smokeless tobacco: Never Used  . Tobacco comment: 3 cig/day  Vaping Use  . Vaping Use: Never used  Substance and Sexual Activity  . Alcohol use: No  . Drug use: Not Currently  . Sexual activity: Not on file  Other Topics Concern  . Not on file  Social History Narrative  . Not on file   Social Determinants of Health   Financial Resource Strain: Not on file  Food Insecurity: Not on file  Transportation Needs: Not on file  Physical Activity: Not on file  Stress: Not on file  Social  Connections: Not on file  Intimate Partner Violence: Not on file    Physical Exam: Vital signs in last 24 hours: Temp:  [97.9 F (36.6 C)-98.7 F (37.1 C)] 98 F (36.7 C) (03/23 0416) Pulse Rate:  [75-94] 75 (03/23 0416) Resp:  [18-24] 20 (03/23 0416) BP: (102-138)/(45-89) 137/60 (03/23 0416) SpO2:  [93 %-98 %] 93 % (03/23 0416) Weight:  [47.6 kg] 47.6 kg (03/22 0858) Last BM Date: 06/19/20 GEN: NAD EYE: Sclerae anicteric ENT: MMM CV: Non-tachycardic GI: Soft, NT/ND NEURO:  Alert & Oriented x 3  Lab Results: Recent Labs    06/18/20 1750 06/19/20 0202 06/19/20 1200 06/20/20 0048  WBC 10.6* 6.7  --  4.2  HGB 11.3* 10.3* 9.2* 9.1*  HCT 33.6* 31.3* 28.0* 26.8*  PLT 195 159  --  139*   BMET Recent Labs    06/18/20 1750 06/20/20 0048  NA 136 138  K 3.9 3.2*  CL 105 109  CO2 25 24  GLUCOSE 126* 80  BUN 49* 15  CREATININE 0.77 0.66  CALCIUM 9.0 8.5*   LFT Recent Labs    06/18/20 1750  PROT 5.8*  ALBUMIN 3.4*  AST 19  ALT 11  ALKPHOS 58  BILITOT 0.7   PT/INR No results for input(s): LABPROT, INR in the last 72 hours.   Impression / Plan: This is a 85 y.o.female who presents for EGD for evaluation of melena/anemia/dark stools.  The risks and benefits of endoscopic evaluation were discussed with the patient; these include but are not limited to the risk of perforation, infection, bleeding, missed lesions, lack of diagnosis, severe illness requiring hospitalization, as well as anesthesia and sedation related illnesses.  The patient is agreeable to proceed.    Justice Britain, MD [facility] Office # [phone]

## 2020-06-20 NOTE — Telephone Encounter (Signed)
Dr.Mansouraty please advise as the pt has never been seen in office, but looks like you did EGD at hospital.

## 2020-06-20 NOTE — Progress Notes (Addendum)
Daily Rounding Note  06/20/2020, 8:40 AM  LOS: 0 days   SUBJECTIVE:   Chief complaint: GI bleed.  Duodenal bulb ulcer, gastritis.  Small black stool yesterday.  No nausea, vomiting, no abdominal pain.  Weakness present at admission has resolved.  She is able to walk around without problems.  Eating solid diet.  Overall feels well and much better than at admission  OBJECTIVE:         Vital signs in last 24 hours:    Temp:  [97.9 F (36.6 C)-98.7 F (37.1 C)] 98 F (36.7 C) (03/23 0416) Pulse Rate:  [75-94] 75 (03/23 0416) Resp:  [18-24] 20 (03/23 0416) BP: (102-138)/(45-89) 137/60 (03/23 0416) SpO2:  [93 %-98 %] 93 % (03/23 0416) Weight:  [47.6 kg] 47.6 kg (03/22 0858) Last BM Date: 06/19/20 Filed Weights   06/19/20 0858  Weight: 47.6 kg   General: Pleasant, comfortable.  Appears younger than stated age. Heart: RRR Chest: Clear bilaterally.  No cough or labored breathing. Abdomen: Soft without tenderness.  Active bowel sounds.  No distention. Extremities: No CCE. Neuro/Psych: Pleasant, comfortable, calm, fluid speech.  No tremors or gross weakness.  Intake/Output from previous day: 03/22 0701 - 03/23 0700 In: 390 [P.O.:240; I.V.:150] Out: 750 [Urine:750]  Intake/Output this shift: No intake/output data recorded.  Lab Results: Recent Labs    06/18/20 1750 06/19/20 0202 06/19/20 1200 06/20/20 0048  WBC 10.6* 6.7  --  4.2  HGB 11.3* 10.3* 9.2* 9.1*  HCT 33.6* 31.3* 28.0* 26.8*  PLT 195 159  --  139*   BMET Recent Labs    06/18/20 1750 06/20/20 0048  NA 136 138  K 3.9 3.2*  CL 105 109  CO2 25 24  GLUCOSE 126* 80  BUN 49* 15  CREATININE 0.77 0.66  CALCIUM 9.0 8.5*   LFT Recent Labs    06/18/20 1750  PROT 5.8*  ALBUMIN 3.4*  AST 19  ALT 11  ALKPHOS 58  BILITOT 0.7   PT/INR No results for input(s): LABPROT, INR in the last 72 hours. Hepatitis Panel No results for input(s): HEPBSAG,  HCVAB, HEPAIGM, HEPBIGM in the last 72 hours.  Studies/Results: DG Chest 1 View  Result Date: 06/19/2020 CLINICAL DATA:  Black tarry stool. EXAM: CHEST  1 VIEW COMPARISON:  12/21/2018 FINDINGS: Left chest wall pacer device is noted with leads in the right atrial appendage and right ventricle. Heart size appears normal. Aortic atherosclerosis. No pleural effusion or edema. No airspace opacities identified. The visualized osseous structures are unremarkable. IMPRESSION: No active cardiopulmonary abnormalities. Electronically Signed   By: Kerby Moors M.D.   On: 06/19/2020 10:09    ASSESMENT:   *  UGI bleed w melena.   06/19/20 EGD: HH.  Clean-based, non-bleeding DU at bulb.  Gastritis.   81 ASA PTA.    *   [place] anemia.  Hgb 11.3 >> 9.2 >> 9.1.  No PRBCs to date.    *   Thrombocytopenia at 139.    *   TAVR 11/2018.     PLAN   *   Carafate AC/HS for 4 weeks.  Protonix 40 mg po bid for 4 weeks, then drop to daily.   If H Pylori is positive, add abx. Resume ASA 81 mg/day on 06/29/20.    *    check CBC within the next 10 days to assure it is heading in the right direction.  *   Darci Current is GI  MD. if H. pylori returns positive after she discharges, GI will plan to call antibiotic prescription to her pharmacy.  *   Encouraged patient to stop smoking, she is aware and optimistic that she can not restart smoking when she leaves the hospital.  At arrival her consumption was 1 pack over 3 days.    Azucena Freed  06/20/2020, 8:40 AM Phone [phone]

## 2020-06-20 NOTE — Discharge Summary (Signed)
Physician Discharge Summary  [name] ERD:[id number] DOB: 07-11-1935 DOA: 06/18/2020  PCP: Leonard Downing, MD  Admit date: 06/18/2020 Discharge date: 06/20/2020  Admitted From: home Discharge disposition: home   Recommendations for Outpatient Follow-Up:   1. Cbc/BMP  1 week 2. h-pylori pending    Discharge Diagnosis:   Principal Problem:   GI bleed Active Problems:   Acute blood loss anemia   HTN (hypertension)   HLD (hyperlipidemia)   Carotid arterial disease ([place])    Discharge Condition: Improved.  Diet recommendation: Low sodium, heart healthy.  Carbohydrate-modified.  Wound care: None.  Code status: Full.   History of Present Illness:   [name] is a 85 y.o. female with medical history significant of hypertension, hyperlipidemia, carotid artery disease, high-grade AV block status post PPM, severe aortic stenosis status post TAVR, history of endometrial adenocarcinoma in 2011 presenting with a chief complaint of black stools.  Patient states for the past 2 weeks she is feeling weak and having chills.  Since yesterday her stools are black tarry in color.  She has not vomited blood.  Denies abdominal pain.  Denies history of GI bleed.  Denies ethanol or NSAID use.  States she had a colonoscopy done about 10 or 15 years ago but has never had endoscopy done in the past.  Reports having chronic nasal congestion and cough, no recent change.  Denies shortness of breath or chest pain.  She is vaccinated against COVID.   Hospital Course by Problem:   Acute blood loss anemia secondary to suspected acute upper GI bleed: Rectal exam notable for melenicstool. FOBT positive. Hemoglobin 11.3, was ranging between 12-13 in September 2020; no recent labs for comparison. No hematemesis. Abdominal exam benign. Hemodynamically stable. Patient denies ethanol or NSAID use. Denies history of prior GI bleed. No prior EGD results in the chart.She takes aspirin  81 mg daily at home but is not on any anticoagulants. -s/p EGD:    - OK to maintain Aspirin with restart tomorrow for                            heart health in setting of TAVR and underlying CHF.                            However, she should not be on Aspirin without PPI                            therapy in future to decrease risk of recurrence.                           - No other NSAIDs at this time.                           - Await pathology results. Treat HP if positive.   - OK to maintain Aspirin with restart tomorrow for                            heart health in setting of TAVR and underlying CHF.                            However, she  should not be on Aspirin without PPI                            therapy in future to decrease risk of recurrence.                           - No other NSAIDs at this time.                           - Await pathology results. Treat HP if positive.  Mild leukocytosis,chills:  -Patient currently afebrile and leukocytosis appears to be resolved.  TSH 1.932.  Procalcitonin low. -No concern for active infection at this time.  Hypertension -Not hypertensive. Holding antihypertensives until PCP follo wup  Hyperlipidemia -Continue statin  Carotid artery disease -per op note can restart her ASA along with protonix  Hypokalemia -replerte   Medical Consultants:   GI   Discharge Exam:   Vitals:   06/19/20 2337 06/20/20 0416  BP: (!) 128/54 137/60  Pulse: 82 75  Resp: 20 20  Temp: 98.4 F (36.9 C) 98 F (36.7 C)  SpO2: 94% 93%   Vitals:   06/19/20 1714 06/19/20 1958 06/19/20 2337 06/20/20 0416  BP: (!) 131/57 (!) 123/56 (!) 128/54 137/60  Pulse: 75 77 82 75  Resp: 19 20 20 20   Temp: 97.9 F (36.6 C) 98.3 F (36.8 C) 98.4 F (36.9 C) 98 F (36.7 C)  TempSrc: Oral Oral Oral Oral  SpO2: 98% 96% 94% 93%  Weight:      Height:        General exam: Appears calm and comfortable.  The results of significant diagnostics  from this hospitalization (including imaging, microbiology, ancillary and laboratory) are listed below for reference.     Procedures and Diagnostic Studies:   DG Chest 1 View  Result Date: 06/19/2020 CLINICAL DATA:  Black tarry stool. EXAM: CHEST  1 VIEW COMPARISON:  12/21/2018 FINDINGS: Left chest wall pacer device is noted with leads in the right atrial appendage and right ventricle. Heart size appears normal. Aortic atherosclerosis. No pleural effusion or edema. No airspace opacities identified. The visualized osseous structures are unremarkable. IMPRESSION: No active cardiopulmonary abnormalities. Electronically Signed   By: Kerby Moors M.D.   On: 06/19/2020 10:09     Labs:   Basic Metabolic Panel: Recent Labs  Lab 06/18/20 1750 06/20/20 0048  NA 136 138  K 3.9 3.2*  CL 105 109  CO2 25 24  GLUCOSE 126* 80  BUN 49* 15  CREATININE 0.77 0.66  CALCIUM 9.0 8.5*  MG  --  1.8   GFR Estimated Creatinine Clearance: 40.7 mL/min (by C-G formula based on SCr of 0.66 mg/dL). Liver Function Tests: Recent Labs  Lab 06/18/20 1750  AST 19  ALT 11  ALKPHOS 58  BILITOT 0.7  PROT 5.8*  ALBUMIN 3.4*   No results for input(s): LIPASE, AMYLASE in the last 168 hours. No results for input(s): AMMONIA in the last 168 hours. Coagulation profile No results for input(s): INR, PROTIME in the last 168 hours.  CBC: Recent Labs  Lab 06/18/20 1750 06/19/20 0202 06/19/20 1200 06/20/20 0048  WBC 10.6* 6.7  --  4.2  HGB 11.3* 10.3* 9.2* 9.1*  HCT 33.6* 31.3* 28.0* 26.8*  MCV 91.3 92.1  --  90.5  PLT 195 159  --  139*   Cardiac Enzymes:  No results for input(s): CKTOTAL, CKMB, CKMBINDEX, TROPONINI in the last 168 hours. BNP: Invalid input(s): POCBNP CBG: No results for input(s): GLUCAP in the last 168 hours. D-Dimer No results for input(s): DDIMER in the last 72 hours. Hgb A1c No results for input(s): HGBA1C in the last 72 hours. Lipid Profile No results for input(s): CHOL, HDL,  LDLCALC, TRIG, CHOLHDL, LDLDIRECT in the last 72 hours. Thyroid function studies Recent Labs    06/19/20 0202  TSH 1.932   Anemia work up No results for input(s): VITAMINB12, FOLATE, FERRITIN, TIBC, IRON, RETICCTPCT in the last 72 hours. Microbiology Recent Results (from the past 240 hour(s))  SARS CORONAVIRUS 2 (TAT 6-24 HRS) Nasopharyngeal Nasopharyngeal Swab     Status: None   Collection Time: 06/19/20  4:21 AM   Specimen: Nasopharyngeal Swab  Result Value Ref Range Status   SARS Coronavirus 2 NEGATIVE NEGATIVE Final    Comment: (NOTE) SARS-CoV-2 target nucleic acids are NOT DETECTED.  The SARS-CoV-2 RNA is generally detectable in upper and lower respiratory specimens during the acute phase of infection. Negative results do not preclude SARS-CoV-2 infection, do not rule out co-infections with other pathogens, and should not be used as the sole basis for treatment or other patient management decisions. Negative results must be combined with clinical observations, patient history, and epidemiological information. The expected result is Negative.  Fact Sheet for Patients: SugarRoll.be  Fact Sheet for Healthcare Providers: https://www.woods-mathews.com/  This test is not yet approved or cleared by the [place] FDA and  has been authorized for detection and/or diagnosis of SARS-CoV-2 by FDA under an Emergency Use Authorization (EUA). This EUA will remain  in effect (meaning this test can be used) for the duration of the COVID-19 declaration under Se ction 564(b)(1) of the Act, 21 U.S.C. section 360bbb-3(b)(1), unless the authorization is terminated or revoked sooner.  Performed at [facility], [place] [address], [place], [place] [address]      Discharge Instructions:   Discharge Instructions    Diet - low sodium heart healthy   Complete by: As directed    Discharge instructions   Complete by: As directed    Carafate  can be constipating so please be sure to use a bowel regimen to avoid this such as miralax CBC Monday Hold norvasc for now-- check BP at home and resume if SBP (top #) is >140   Increase activity slowly   Complete by: As directed      Allergies as of 06/20/2020   No Known Allergies     Medication List    STOP taking these medications   amLODipine 10 MG tablet Commonly known as: NORVASC     TAKE these medications   acetaminophen 500 MG tablet Commonly known as: TYLENOL Take 500 mg by mouth at bedtime.   amoxicillin 500 MG tablet Commonly known as: AMOXIL Take 4 capsules (2,000 mg) one hour prior to all dental visits.   aspirin 81 MG chewable tablet Chew 1 tablet (81 mg total) by mouth daily.   carboxymethylcellulose 0.5 % Soln Commonly known as: REFRESH PLUS Place 1 drop into both eyes daily as needed (dry eyes).   GOLD BOND PSORIASIS RELIEF EX Apply 1 application topically daily. scalp   hydrOXYzine 50 MG capsule Commonly known as: VISTARIL Take 1 capsule (50 mg total) by mouth 3 (three) times daily as needed for anxiety.   OVER THE COUNTER MEDICATION Apply 1 application topically daily. Cortisone 10 to scalp   pantoprazole 40  MG tablet Commonly known as: PROTONIX 40 mg BID x 4 weeks then daily   simvastatin 20 MG tablet Commonly known as: ZOCOR Take 20 mg by mouth daily.   sucralfate 1 GM/10ML suspension Commonly known as: CARAFATE Take 10 mLs (1 g total) by mouth 4 (four) times daily -  with meals and at bedtime.   vitamin B-12 500 MCG tablet Commonly known as: CYANOCOBALAMIN Take 500 mcg by mouth daily.   Vitamin D-3 125 MCG (5000 UT) Tabs Take 5,000 Units by mouth daily.   vitamin E 180 MG (400 UNITS) capsule Take 400 Units by mouth daily.         Time coordinating discharge: 25 min  Signed:  Geradine Girt DO  [facility] 06/20/2020, 9:39 AM

## 2020-06-20 NOTE — Progress Notes (Signed)
   06/20/20 1015  Clinical Encounter Type  Visited With Patient  Visit Type Spiritual support  Referral From Nurse  Consult/Referral To Chaplain  Spiritual Encounters  Spiritual Needs Prayer  Stress Factors  Patient Stress Factors Major life changes  Chaplain responded to the Consult for Ms. Wrightsman.  She had requested prayer. I enjoyed my visit with Ms. Carrara, she was so pleasant, kind and grateful for the visit.  We talked on her being discarded today hopefully and her niece will be staying with her.  I was happy to know this information.           Ms. Capasso and I had prayer and she stated "God Bless You"  Madelynn Done [phone]

## 2020-06-21 ENCOUNTER — Encounter (HOSPITAL_COMMUNITY): Payer: Self-pay | Admitting: Gastroenterology

## 2020-06-21 ENCOUNTER — Encounter: Payer: Self-pay | Admitting: Gastroenterology

## 2020-06-21 NOTE — Telephone Encounter (Signed)
[name], Patient is actually a Dr. Carlean Purl patient from years prior but he was not aware of this recent hospitalization and finding. Please find out what PPI will be covered by her insurance she can be placed on that (Omeprazole 40 twice daily or Nexium 40 twice daily Dexilant 60 mg daily or AcipHex twice daily). Thanks. GM

## 2020-06-21 NOTE — Telephone Encounter (Signed)
Returned call to pt. She actually pick up Pantoprazole. She will take Pantoprazole for now. I offered to make a follow-up appointment with Dr. Carlean Purl. But pt states that she will call back if she decides to schedule an appointment.

## 2020-06-22 LAB — SURGICAL PATHOLOGY

## 2020-06-28 ENCOUNTER — Encounter: Payer: Self-pay | Admitting: Internal Medicine

## 2020-06-28 DIAGNOSIS — B9681 Helicobacter pylori [H. pylori] as the cause of diseases classified elsewhere: Secondary | ICD-10-CM

## 2020-06-28 DIAGNOSIS — K269 Duodenal ulcer, unspecified as acute or chronic, without hemorrhage or perforation: Secondary | ICD-10-CM

## 2020-06-28 HISTORY — DX: Helicobacter pylori (H. pylori) as the cause of diseases classified elsewhere: B96.81

## 2020-06-28 HISTORY — DX: Duodenal ulcer, unspecified as acute or chronic, without hemorrhage or perforation: K26.9

## 2020-06-28 NOTE — Progress Notes (Signed)
[name],  Explain she need treatment for the infection with H pylori   1) Pantoprazole 40 mg 2 times a day x 14 d (already has) 2) Pepto Bismol 2 tabs (262 mg each) 4 times a day x 14 d 3) Metronidazole 250 mg 4 times a day x 14 d 4) doxycycline 100 mg 2 times a day x 14 d  After 14 d stop pantoprazole also  She can stop sucralfate now  In 4 weeks after treatment completed do H. Pylori stool antigen - dx H. Pylori gastritis

## 2020-06-29 ENCOUNTER — Other Ambulatory Visit: Payer: Self-pay

## 2020-06-29 DIAGNOSIS — A048 Other specified bacterial intestinal infections: Secondary | ICD-10-CM

## 2020-06-29 NOTE — Progress Notes (Signed)
Have her add pepto bismol qid for whatever days are left  And do the f/u test as planned

## 2020-07-04 ENCOUNTER — Observation Stay (HOSPITAL_COMMUNITY)
Admission: EM | Admit: 2020-07-04 | Discharge: 2020-07-06 | Disposition: A | Discharge status: Home / Self Care | Payer: Medicare HMO | Attending: Emergency Medicine | Admitting: Emergency Medicine

## 2020-07-04 ENCOUNTER — Emergency Department (HOSPITAL_COMMUNITY): Payer: Medicare HMO

## 2020-07-04 ENCOUNTER — Encounter (HOSPITAL_COMMUNITY): Payer: Self-pay

## 2020-07-04 DIAGNOSIS — I2510 Atherosclerotic heart disease of native coronary artery without angina pectoris: Secondary | ICD-10-CM | POA: Insufficient documentation

## 2020-07-04 DIAGNOSIS — E785 Hyperlipidemia, unspecified: Secondary | ICD-10-CM | POA: Diagnosis present

## 2020-07-04 DIAGNOSIS — Z20822 Contact with and (suspected) exposure to covid-19: Secondary | ICD-10-CM | POA: Diagnosis not present

## 2020-07-04 DIAGNOSIS — Z950 Presence of cardiac pacemaker: Secondary | ICD-10-CM | POA: Diagnosis not present

## 2020-07-04 DIAGNOSIS — R195 Other fecal abnormalities: Secondary | ICD-10-CM

## 2020-07-04 DIAGNOSIS — F17210 Nicotine dependence, cigarettes, uncomplicated: Secondary | ICD-10-CM | POA: Diagnosis not present

## 2020-07-04 DIAGNOSIS — Z7982 Long term (current) use of aspirin: Secondary | ICD-10-CM | POA: Insufficient documentation

## 2020-07-04 DIAGNOSIS — Z8542 Personal history of malignant neoplasm of other parts of uterus: Secondary | ICD-10-CM | POA: Diagnosis not present

## 2020-07-04 DIAGNOSIS — R778 Other specified abnormalities of plasma proteins: Secondary | ICD-10-CM

## 2020-07-04 DIAGNOSIS — Z79899 Other long term (current) drug therapy: Secondary | ICD-10-CM | POA: Diagnosis not present

## 2020-07-04 DIAGNOSIS — K921 Melena: Secondary | ICD-10-CM | POA: Diagnosis not present

## 2020-07-04 DIAGNOSIS — R0600 Dyspnea, unspecified: Secondary | ICD-10-CM

## 2020-07-04 DIAGNOSIS — I10 Essential (primary) hypertension: Secondary | ICD-10-CM | POA: Insufficient documentation

## 2020-07-04 DIAGNOSIS — R0609 Other forms of dyspnea: Secondary | ICD-10-CM | POA: Diagnosis not present

## 2020-07-04 DIAGNOSIS — R531 Weakness: Secondary | ICD-10-CM

## 2020-07-04 DIAGNOSIS — R0602 Shortness of breath: Secondary | ICD-10-CM | POA: Diagnosis present

## 2020-07-04 LAB — POC OCCULT BLOOD, ED: Fecal Occult Bld: POSITIVE — AB

## 2020-07-04 LAB — BASIC METABOLIC PANEL
Anion gap: 5 (ref 5–15)
BUN: 18 mg/dL (ref 8–23)
CO2: 27 mmol/L (ref 22–32)
Calcium: 9 mg/dL (ref 8.9–10.3)
Chloride: 105 mmol/L (ref 98–111)
Creatinine, Ser: 0.73 mg/dL (ref 0.44–1.00)
GFR, Estimated: >60 mL/min (ref >60)
Glucose, Bld: 118 mg/dL — ABNORMAL HIGH (ref 70–99)
Potassium: 3.5 mmol/L (ref 3.5–5.1)
Sodium: 137 mmol/L (ref 135–145)

## 2020-07-04 LAB — CBC
HCT: 36.6 % (ref 36.0–46.0)
Hemoglobin: 11.9 g/dL — ABNORMAL LOW (ref 12.0–15.0)
MCH: 30.8 pg (ref 26.0–34.0)
MCHC: 32.5 g/dL (ref 30.0–36.0)
MCV: 94.8 fL (ref 80.0–100.0)
Platelets: 224 10*3/uL (ref 150–400)
RBC: 3.86 MIL/uL — ABNORMAL LOW (ref 3.87–5.11)
RDW: 15.7 % — ABNORMAL HIGH (ref 11.5–15.5)
WBC: 6.1 10*3/uL (ref 4.0–10.5)
nRBC: 0 % (ref 0.0–0.2)

## 2020-07-04 LAB — RESP PANEL BY RT-PCR (FLU A&B, COVID) ARPGX2
Influenza A by PCR: NEGATIVE
Influenza B by PCR: NEGATIVE
SARS Coronavirus 2 by RT PCR: NEGATIVE

## 2020-07-04 LAB — TROPONIN I (HIGH SENSITIVITY)
Troponin I (High Sensitivity): 33 ng/L — ABNORMAL HIGH (ref <18)
Troponin I (High Sensitivity): 36 ng/L — ABNORMAL HIGH (ref <18)

## 2020-07-04 LAB — D-DIMER, QUANTITATIVE: D-Dimer, Quant: 1.74 ug/mL-FEU — ABNORMAL HIGH (ref 0.00–0.50)

## 2020-07-04 MED ORDER — HYDROXYZINE HCL 50 MG PO TABS
50.0000 mg | ORAL_TABLET | Freq: Three times a day (TID) | ORAL | Status: DC | PRN
  Administered 2020-07-06: 50 mg via ORAL
  Filled 2020-07-04 (×4): qty 1

## 2020-07-04 MED ORDER — SUCRALFATE 1 GM/10ML PO SUSP
1.0000 g | Freq: Three times a day (TID) | ORAL | Status: DC
  Administered 2020-07-05 – 2020-07-06 (×7): 1 g via ORAL
  Filled 2020-07-04 (×10): qty 10

## 2020-07-04 MED ORDER — BISMUTH SUBSALICYLATE 262 MG PO CHEW
524.0000 mg | CHEWABLE_TABLET | Freq: Four times a day (QID) | ORAL | Status: DC | PRN
  Filled 2020-07-04: qty 2

## 2020-07-04 MED ORDER — ACETAMINOPHEN 650 MG RE SUPP: 650.0000 mg | Freq: Four times a day (QID) | RECTAL | Status: DC | PRN

## 2020-07-04 MED ORDER — ALBUTEROL SULFATE HFA 108 (90 BASE) MCG/ACT IN AERS
2.0000 | INHALATION_SPRAY | RESPIRATORY_TRACT | Status: DC | PRN
  Administered 2020-07-05: 2 via RESPIRATORY_TRACT
  Filled 2020-07-04: qty 6.7

## 2020-07-04 MED ORDER — TETRACYCLINE HCL 250 MG PO CAPS
500.0000 mg | ORAL_CAPSULE | Freq: Four times a day (QID) | ORAL | Status: DC
  Administered 2020-07-05: 500 mg via ORAL
  Filled 2020-07-04 (×4): qty 2

## 2020-07-04 MED ORDER — SIMVASTATIN 20 MG PO TABS
20.0000 mg | ORAL_TABLET | Freq: Every day | ORAL | Status: DC
  Administered 2020-07-05 – 2020-07-06 (×3): 20 mg via ORAL
  Filled 2020-07-04 (×3): qty 1

## 2020-07-04 MED ORDER — IOHEXOL 350 MG/ML SOLN
75.0000 mL | Freq: Once | INTRAVENOUS | Status: AC | PRN
  Administered 2020-07-04: 50 mL via INTRAVENOUS

## 2020-07-04 MED ORDER — AMLODIPINE BESYLATE 10 MG PO TABS
10.0000 mg | ORAL_TABLET | Freq: Every day | ORAL | Status: DC
  Administered 2020-07-05 – 2020-07-06 (×3): 10 mg via ORAL
  Filled 2020-07-04: qty 1
  Filled 2020-07-04: qty 2
  Filled 2020-07-04: qty 1

## 2020-07-04 MED ORDER — METRONIDAZOLE 500 MG PO TABS
500.0000 mg | ORAL_TABLET | Freq: Three times a day (TID) | ORAL | Status: DC
  Administered 2020-07-05 (×2): 500 mg via ORAL
  Filled 2020-07-04 (×2): qty 1

## 2020-07-04 MED ORDER — ACETAMINOPHEN 325 MG PO TABS
650.0000 mg | ORAL_TABLET | Freq: Four times a day (QID) | ORAL | Status: DC | PRN
  Administered 2020-07-06: 650 mg via ORAL
  Filled 2020-07-04: qty 2

## 2020-07-04 MED ORDER — CITALOPRAM HYDROBROMIDE 20 MG PO TABS
40.0000 mg | ORAL_TABLET | Freq: Every day | ORAL | Status: DC
  Administered 2020-07-05 (×2): 40 mg via ORAL
  Filled 2020-07-04: qty 2
  Filled 2020-07-04: qty 4

## 2020-07-04 MED ORDER — PANTOPRAZOLE SODIUM 40 MG PO TBEC
40.0000 mg | DELAYED_RELEASE_TABLET | Freq: Every day | ORAL | Status: DC
  Administered 2020-07-05: 40 mg via ORAL
  Filled 2020-07-04: qty 1

## 2020-07-04 NOTE — H&P (Signed)
History and Physical    [name] GEZ:[id number] DOB: 1936-01-25 DOA: 07/04/2020  PCP: Leonard Downing, MD Patient coming from: Home  Chief Complaint: Fatigue  HPI: [name] is a 85 y.o. 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  She was recently admitted to the hospital 06/18/2020-06/20/2020 for melena/upper GI bleed and underwent EGD which revealed gastritis and a nonbleeding duodenal ulcer.  Stomach biopsy showing chronic active gastritis and no intestinal metaplasia; H. pylori immunohistochemistry was positive.  She was started on treatment for H. pylori.  Home aspirin was resumed.  In the ED, hemodynamically stable.  Hemoglobin 11.9, improved compared to prior labs.  High-sensitivity troponin mildly elevated but stable 33 >36.  EKG showing paced rhythm.  D-dimer elevated at 1.74.  Covid and influenza PCR pending.  Chest x-ray showing no active disease.  CT angiogram chest negative for PE.  Showing no acute intrathoracic abnormality other than trace bilateral pleural effusions.  Patient states she started taking the medications prescribed to her by gastroenterology a week ago.  Initially her stool was very yellow in color but for the past few days it has turned black.  Reports multiple episodes of black-colored loose stool/diarrhea for the past few days.  Denies abdominal pain.  She has not vomited blood.  States she feels very weak and gets short of breath just walking within her house.  Reports having a cough but denies fevers.  Denies alcohol or NSAID use.  She takes a baby aspirin daily.  Reports some tingling sensation at the site of her pacemaker on her chest wall.  Denies chest pain.  Review of Systems:  All systems  reviewed and apart from history of presenting illness, are negative.  Past Medical History:  Diagnosis Date  . Anxiety    Stable  . ASCUS on Pap smear 11/15/09  . Carotid artery disease (HCC)    1-39% RICA stenosis and 65-03% LICA stenosis on pre TAVR dopplers   . Depression   . Duodenal ulcer due to Helicobacter pylori 5/46/5681  . Endometrial polyp 11/15/2009  . Endometrioid adenocarcinoma   . H/O varicella   . Hemorrhoids   . History of measles, mumps, or rubella   . Hypercholesterolemia   . Hypertension   . Menopausal symptoms 06/13/10  . Ovarian cyst 11/15/09  . PMB (postmenopausal bleeding) 11/15/09  . Presence of permanent cardiac pacemaker 10/20/2018  . Vaginal bleeding     Past Surgical History:  Procedure Laterality Date  . BIOPSY  06/19/2020   Procedure: BIOPSY;  Surgeon: Rush Landmark Telford Nab., MD;  Location: [place];  Service: Gastroenterology;;  . BREAST SURGERY     Over twenty - five  years ago  . CATARACT EXTRACTION W/ INTRAOCULAR LENS IMPLANT Bilateral   . CERVICAL BIOPSY    . ESOPHAGOGASTRODUODENOSCOPY (EGD) WITH PROPOFOL N/A 06/19/2020   Procedure: ESOPHAGOGASTRODUODENOSCOPY (EGD) WITH PROPOFOL;  Surgeon: Rush Landmark Telford Nab., MD;  Location: [place];  Service: Gastroenterology;  Laterality: N/A;  . EYE SURGERY    . INSERT / REPLACE / REMOVE PACEMAKER  10/20/2018  . LAPAROSCOPIC ASSISTED VAGINAL HYSTERECTOMY  12/2009   BSO  . MULTIPLE EXTRACTIONS WITH ALVEOLOPLASTY N/A 12/09/2018   Procedure: EXTRACTION OF TOOTH #'S 6,21-29 AND 32 WITH ALVEOLOPLASTY AND BILATERAL MANDIBULAR TORI REDUCTIONS;  Surgeon: Lenn Cal, DDS;  Location: [place];  Service: Oral Surgery;  Laterality: N/A;  . PACEMAKER IMPLANT N/A 10/20/2018   Procedure: PACEMAKER IMPLANT;  Surgeon: Deboraha Sprang, MD;  Location: [place];  Service: Cardiovascular;  Laterality: N/A;  . RIGHT/LEFT HEART CATH AND CORONARY ANGIOGRAPHY N/A 11/24/2018   Procedure: RIGHT/LEFT HEART  CATH AND CORONARY ANGIOGRAPHY;  Surgeon: Burnell Blanks, MD;  Location: [place];  Service: Cardiovascular;  Laterality: N/A;  . TEE WITHOUT CARDIOVERSION N/A 12/21/2018   Procedure: TRANSESOPHAGEAL ECHOCARDIOGRAM (TEE);  Surgeon: Burnell Blanks, MD;  Location: [place];  Service: Open Heart Surgery;  Laterality: N/A;     reports that she has been smoking cigarettes. She has a 27.50 pack-year smoking history. She has never used smokeless tobacco. She reports previous drug use. She reports that she does not drink alcohol.  No Known Allergies  Family History  Problem Relation Age of Onset  . Lung cancer Brother   . Heart disease Brother   . Heart failure Mother   . Parkinson's disease Father     Prior to Admission medications   Medication Sig Start Date End Date Taking? Authorizing Provider  acetaminophen (TYLENOL) 500 MG tablet Take 500 mg by mouth at bedtime.    [provider]  amoxicillin (AMOXIL) 500 MG tablet Take 4 capsules (2,000 mg) one hour prior to all dental visits. 12/29/18   Eileen Stanford, PA-C  aspirin 81 MG chewable tablet Chew 1 tablet (81 mg total) by mouth daily. 12/23/18   Barrett, Lodema Hong, PA-C  carboxymethylcellulose (REFRESH PLUS) 0.5 % SOLN Place 1 drop into both eyes daily as needed (dry eyes).     [provider]  Cholecalciferol (VITAMIN D-3) 125 MCG (5000 UT) TABS Take 5,000 Units by mouth daily.    [provider]  hydrOXYzine (VISTARIL) 50 MG capsule Take 1 capsule (50 mg total) by mouth 3 (three) times daily as needed for anxiety. 11/25/18   Eileen Stanford, PA-C  OVER THE COUNTER MEDICATION Apply 1 application topically daily. Cortisone 10 to scalp    [provider]  pantoprazole (PROTONIX) 40 MG tablet 40 mg BID x 4 weeks then daily 06/20/20   Eulogio Bear U, DO  Salicylic Acid (GOLD BOND PSORIASIS RELIEF EX) Apply 1 application topically daily. scalp    [provider]  simvastatin  (ZOCOR) 20 MG tablet Take 20 mg by mouth daily.    [provider]  sucralfate (CARAFATE) 1 GM/10ML suspension Take 10 mLs (1 g total) by mouth 4 (four) times daily -  with meals and at bedtime. 06/20/20   Geradine Girt, DO  vitamin B-12 (CYANOCOBALAMIN) 500 MCG tablet Take 500 mcg by mouth daily.    [provider]  vitamin E 180 MG (400 UNITS) capsule Take 400 Units by mouth daily.    [provider]    Physical Exam: Vitals:   07/04/20 1615 07/04/20 1700 07/04/20 1715 07/04/20 1906  BP: 126/77 132/75 (!) 142/77 (!) 162/78  Pulse: 100 92 61 68  Resp: 20 (!) 21 (!) 22 (!) 23  Temp:      TempSrc:      SpO2: 95% 96% 95% 94%    Physical Exam Constitutional:      General: She is not in acute distress. HENT:     Head: Normocephalic and atraumatic.  Eyes:     Extraocular Movements: Extraocular movements intact.     Conjunctiva/sclera: Conjunctivae normal.  Cardiovascular:     Rate and Rhythm: Normal rate and regular rhythm.     Pulses: Normal pulses.  Pulmonary:     Effort: Pulmonary effort is normal. No respiratory distress.     Breath sounds: Normal breath sounds. No wheezing or rales.  Abdominal:     General: Bowel sounds are normal. There is no distension.     Palpations: Abdomen is soft.     Tenderness: There is no abdominal tenderness. There is no guarding or rebound.  Musculoskeletal:        General: No swelling or tenderness.     Cervical back: Normal range of motion and neck supple.  Skin:    General: Skin is warm and dry.  Neurological:     General: No focal deficit present.     Mental Status: She is alert and oriented to person, place, and time.     Labs on Admission: I have personally reviewed following labs and imaging studies  CBC: Recent Labs  Lab 07/04/20 1319  WBC 6.1  HGB 11.9*  HCT 36.6  MCV 94.8  PLT 941   Basic Metabolic Panel: Recent Labs  Lab 07/04/20 1319  NA 137  K 3.5  CL 105  CO2 27  GLUCOSE 118*  BUN  18  CREATININE 0.73  CALCIUM 9.0   GFR: CrCl cannot be calculated (Unknown ideal weight.). Liver Function Tests: No results for input(s): AST, ALT, ALKPHOS, BILITOT, PROT, ALBUMIN in the last 168 hours. No results for input(s): LIPASE, AMYLASE in the last 168 hours. No results for input(s): AMMONIA in the last 168 hours. Coagulation Profile: No results for input(s): INR, PROTIME in the last 168 hours. Cardiac Enzymes: No results for input(s): CKTOTAL, CKMB, CKMBINDEX, TROPONINI in the last 168 hours. BNP (last 3 results) No results for input(s): PROBNP in the last 8760 hours. HbA1C: No results for input(s): HGBA1C in the last 72 hours. CBG: No results for input(s): GLUCAP in the last 168 hours. Lipid Profile: No results for input(s): CHOL, HDL, LDLCALC, TRIG, CHOLHDL, LDLDIRECT in the last 72 hours. Thyroid Function Tests: No results for input(s): TSH, T4TOTAL, FREET4, T3FREE, THYROIDAB in the last 72 hours. Anemia Panel: No results for input(s): VITAMINB12, FOLATE, FERRITIN, TIBC, IRON, RETICCTPCT in the last 72 hours. Urine analysis:    Component Value Date/Time   COLORURINE YELLOW 12/17/2018 [place] 12/17/2018 1422   LABSPEC 1.018 12/17/2018 1422   PHURINE 5.0 12/17/2018 [place] 12/17/2018 1422   HGBUR NEGATIVE 12/17/2018 1422   BILIRUBINUR NEGATIVE 12/17/2018 1422   KETONESUR 5 (A) 12/17/2018 1422   PROTEINUR NEGATIVE 12/17/2018 1422   NITRITE NEGATIVE 12/17/2018 1422   LEUKOCYTESUR NEGATIVE 12/17/2018 1422    Radiological Exams on Admission: DG Chest 2 View  Result Date: 07/04/2020 CLINICAL DATA:  Shortness of breath and chest pain. EXAM: CHEST - 2 VIEW COMPARISON:  06/19/2020 FINDINGS: Left chest wall pacer device is noted with lead in the right atrial appendage and right ventricle. Normal heart size. Aortic atherosclerosis. No pleural effusion. No airspace consolidation, atelectasis or pneumothorax. IMPRESSION: 1. No acute  cardiopulmonary abnormalities. 2.  Aortic Atherosclerosis (ICD10-I70.0). Electronically Signed   By: Kerby Moors M.D.   On: 07/04/2020 13:57   CT Angio Chest PE W and/or Wo Contrast  Result Date: 07/04/2020 CLINICAL DATA:  Shortness of breath.  Pulmonary embolus suspected. EXAM: CT ANGIOGRAPHY CHEST WITH CONTRAST TECHNIQUE: Multidetector CT imaging of the chest was performed using the standard protocol during bolus  administration of intravenous contrast. Multiplanar CT image reconstructions and MIPs were obtained to evaluate the vascular anatomy. CONTRAST:  71mL OMNIPAQUE IOHEXOL 350 MG/ML SOLN COMPARISON:  Chest x-ray 07/04/2020, CT angio chest 11/18/2018 FINDINGS: Cardiovascular: Left chest wall cardiac pacemaker with 2 leads in grossly appropriate position. Satisfactory opacification of the pulmonary arteries to the segmental level. No evidence of pulmonary embolism. Normal heart size. No significant pericardial effusion. The thoracic aorta is normal in caliber. Moderate to severe atherosclerotic plaque of the thoracic aorta. Aortic valve replacement. At least 3 vessel moderate coronary artery calcifications. Mediastinum/Nodes: No enlarged mediastinal, hilar, or axillary lymph nodes. Thyroid gland, trachea, and esophagus demonstrate no significant findings. Lungs/Pleura: Bilateral lower lobe subsegmental atelectasis. No focal consolidation. No pulmonary nodule or mass. Trace bilateral pleural effusions. No pneumothorax. Upper Abdomen: No acute abnormality. Musculoskeletal: No chest wall abnormality. No suspicious lytic or blastic osseous lesions. No acute displaced fracture. Multilevel mild degenerative changes of the spine. Review of the MIP images confirms the above findings. IMPRESSION: 1. No pulmonary embolus. 2. No acute intrathoracic abnormality other than trace bilateral pleural effusions. Electronically Signed   By: Iven Finn M.D.   On: 07/04/2020 18:35    EKG: Independently reviewed.   Interpretation limited secondary to paced rhythm.  Assessment/Plan Principal Problem:   Dark stools Active Problems:   HTN (hypertension)   HLD (hyperlipidemia)   Generalized weakness   DOE (dyspnea on exertion)   Dark stools History of upper GI bleed She was recently admitted to the hospital 06/18/2020-06/20/2020 for melena/upper GI bleed and underwent EGD which revealed gastritis and a nonbleeding duodenal ulcer.  Stomach biopsy showing chronic active gastritis and no intestinal metaplasia; H. pylori immunohistochemistry was positive.  She was started on treatment for H. pylori.  Home aspirin was resumed.  Patient started taking H. pylori treatment a week ago and reports black-colored stools.  Denies hematemesis or abdominal pain.  Denies ethanol or NSAID use.  Her hemoglobin is 11.9, improved compared to prior labs.  Abdominal exam benign.  She is hemodynamically stable.  Dark stools could possibly be due to Pepto-Bismol use. -Stool guaiac pending.  If positive, consult gastroenterology in the morning.  Currently on 14-day treatment course for H. Pylori -continue Protonix, bismuth, metronidazole, and tetracycline.  Continue Carafate.  Given complaints of loose stools/diarrhea, C. difficile PCR ordered.  Follow enteric precautions.  Dyspnea on exertion Generalized weakness/fatigue Patient reports dyspnea on minimal exertion and fatigue.  No hypoxia or signs of respiratory distress.  Lungs clear on exam. CT angiogram chest negative for PE.  Showing no acute intrathoracic abnormality other than trace bilateral pleural effusions.  High-sensitivity troponin is mildly elevated but stable 33 >36.  EKG with paced rhythm.  Patient reports intermittent episodes of tingling sensation at the site of pacemaker on her chest wall but denies any chest pain. Echo done September 2021 showing LVEF of 55 to 60%, no regional wall motion abnormalities, grade 1 diastolic dysfunction, and no stenosis or regurgitation of  bioprosthetic aortic valve.  -Repeat echocardiogram, check TSH level, PT/OT eval  Elevated D-dimer CT angiogram negative for PE. -Bilateral lower extremity Dopplers ordered to rule out DVT  Hypertension -Continue home amlodipine  Hyperlipidemia -Continue home Zocor   CAD Not endorsing any anginal symptoms at present.  Troponin mildly elevated but stable. -Hold home aspirin stool guaiac is pending.  Depression, anxiety -Continue home Celexa, hydroxyzine PRN.   DVT prophylaxis: No chemical DVT prophylaxis a stool guaiac is pending.  No SCDs until Dopplers are  done to rule out DVT given elevated D-dimer. Code Status: Patient wishes to be full code. Family Communication: No family available. Disposition Plan: Status is: Observation  The patient remains OBS appropriate and will d/c before 2 midnights.  Dispo: The patient is from: Home              Anticipated d/c is to: Home              Patient currently is not medically stable to d/c.   Difficult to place patient No  Level of care: Level of care: Telemetry Medical   The medical decision making on this patient was of high complexity and the patient is at high risk for clinical deterioration, therefore this is a level 3 visit.  Shela Leff MD [facility]  If 7PM-7AM, please contact night-coverage [email or web address]  07/04/2020, 8:41 PM

## 2020-07-04 NOTE — ED Triage Notes (Signed)
Pt present with sob, cp, black tarry stools,. Pt has pacemaker. Pt reports she was seen here last week for same,.

## 2020-07-04 NOTE — ED Triage Notes (Signed)
Emergency Medicine Provider Triage Evaluation Note  [name] , a 85 y.o. female  was evaluated in triage.  Pt complains of SHOB, states recently mid to the ER with GI bleed, black stools and low hemoglobin.  States she continues to have black stools and notes a yellow seepage from her rectum, unsure where this is coming from.  Denies chest pain.  Review of Systems  Positive: Shortness of breath Negative: Chest pain  Physical Exam  BP (!) 149/80 (BP Location: Right Arm)   Pulse 66   Temp 98.2 F (36.8 C) (Oral)   Resp 16   SpO2 96%  Gen:   Awake, no distress   HEENT:  Atraumatic  Resp:  Normal effort  Cardiac:  Normal rate  Abd:   Nondistended, nontender  MSK:   Moves extremities without difficulty  Neuro:  Speech clear   Medical Decision Making  Medically screening exam initiated at 1:14 PM.  Appropriate orders placed.  [name] was informed that the remainder of the evaluation will be completed by another provider, this initial triage assessment does not replace that evaluation, and the importance of remaining in the ED until their evaluation is complete.  Clinical Impression     Gina Learn, PA-C 07/04/20 1316

## 2020-07-04 NOTE — ED Provider Notes (Signed)
[name]   CSN: [id number] Arrival date & time: 07/04/20  1301     History Chief Complaint  Patient presents with  . Shortness of Breath    [name] is a 85 y.o. female.  HPI Patient presents with shortness of breath.  Began for the last few days.  Recently admitted for GI bleed.  States she began to have black stool stools again.  Recently started on a couple medicines for H. pylori.  Reviewing records appears she may have been started on Pepto-Bismol.  States she has had to use a diaper because she is having loose stools.  Appears to have been started on pantoprazole Pepto-Bismol metronidazole and doxycycline.  States she has been more short of breath.  States she been having trouble walking around.  States at times will get a little bit of chest pain.  Has had a cough with no real sputum production.  Patient is a smoker but states she has no history of COPD.    Past Medical History:  Diagnosis Date  . Anxiety    Stable  . ASCUS on Pap smear 11/15/09  . Carotid artery disease (HCC)    1-39% RICA stenosis and 72-53% LICA stenosis on pre TAVR dopplers   . Depression   . Duodenal ulcer due to Helicobacter pylori 6/64/4034  . Endometrial polyp 11/15/2009  . Endometrioid adenocarcinoma   . H/O varicella   . Hemorrhoids   . History of measles, mumps, or rubella   . Hypercholesterolemia   . Hypertension   . Menopausal symptoms 06/13/10  . Ovarian cyst 11/15/09  . PMB (postmenopausal bleeding) 11/15/09  . Presence of permanent cardiac pacemaker 10/20/2018  . Vaginal bleeding     Patient Active Problem List   Diagnosis Date Noted  . Duodenal ulcer due to Helicobacter pylori 74/25/9563  . Acute blood loss anemia 06/18/2020  . HTN (hypertension) 06/18/2020  . HLD (hyperlipidemia) 06/18/2020  . Carotid arterial disease ([place]) 06/18/2020  . 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 Severe aortic valve  stenosis   . Complete heart block ([place]) 10/20/2018  . Endometrial cancer ([place]) 06/12/2011    Past Surgical History:  Procedure Laterality Date  . BIOPSY  06/19/2020   Procedure: BIOPSY;  Surgeon: Rush Landmark Telford Nab., MD;  Location: [facility];  Service: Gastroenterology;;  . BREAST SURGERY     Over twenty - five  years ago  . CATARACT EXTRACTION W/ INTRAOCULAR LENS IMPLANT Bilateral   . CERVICAL BIOPSY    . ESOPHAGOGASTRODUODENOSCOPY (EGD) WITH PROPOFOL N/A 06/19/2020   Procedure: ESOPHAGOGASTRODUODENOSCOPY (EGD) WITH PROPOFOL;  Surgeon: Rush Landmark Telford Nab., MD;  Location: [facility];  Service: Gastroenterology;  Laterality: N/A;  . EYE SURGERY    . INSERT / REPLACE / REMOVE PACEMAKER  10/20/2018  . LAPAROSCOPIC ASSISTED VAGINAL HYSTERECTOMY  12/2009   BSO  . MULTIPLE EXTRACTIONS WITH ALVEOLOPLASTY N/A 12/09/2018   Procedure: EXTRACTION OF TOOTH #'S 6,21-29 AND 32 WITH ALVEOLOPLASTY AND BILATERAL MANDIBULAR TORI REDUCTIONS;  Surgeon: Lenn Cal, DDS;  Location: [facility];  Service: Oral Surgery;  Laterality: N/A;  . PACEMAKER IMPLANT N/A 10/20/2018   Procedure: PACEMAKER IMPLANT;  Surgeon: Deboraha Sprang, MD;  Location: [facility];  Service: Cardiovascular;  Laterality: N/A;  . RIGHT/LEFT HEART CATH AND CORONARY ANGIOGRAPHY N/A 11/24/2018   Procedure: RIGHT/LEFT HEART CATH AND CORONARY ANGIOGRAPHY;  Surgeon: Burnell Blanks, MD;  Location: [facility];  Service: Cardiovascular;  Laterality: N/A;  . TEE WITHOUT CARDIOVERSION N/A 12/21/2018   Procedure: TRANSESOPHAGEAL ECHOCARDIOGRAM (TEE);  Surgeon: Burnell Blanks, MD;  Location: [facility];  Service: Open Heart Surgery;  Laterality: N/A;     OB History   No obstetric history on file.     Family History  Problem Relation Age of Onset  . Lung cancer Brother   . Heart disease Brother   . Heart failure Mother   . Parkinson's disease Father     Social History   Tobacco Use  . Smoking  status: Current Every Day Smoker    Packs/day: 0.50    Years: 55.00    Pack years: 27.50    Types: Cigarettes  . Smokeless tobacco: Never Used  . Tobacco comment: 3 cig/day  Vaping Use  . Vaping Use: Never used  Substance Use Topics  . Alcohol use: No  . Drug use: Not Currently    Home Medications Prior to Admission medications   Medication Sig Start Date End Date Taking? Authorizing Provider  acetaminophen (TYLENOL) 500 MG tablet Take 500 mg by mouth at bedtime.    [provider]  amoxicillin (AMOXIL) 500 MG tablet Take 4 capsules (2,000 mg) one hour prior to all dental visits. 12/29/18   Eileen Stanford, PA-C  aspirin 81 MG chewable tablet Chew 1 tablet (81 mg total) by mouth daily. 12/23/18   Barrett, Lodema Hong, PA-C  carboxymethylcellulose (REFRESH PLUS) 0.5 % SOLN Place 1 drop into both eyes daily as needed (dry eyes).     [provider]  Cholecalciferol (VITAMIN D-3) 125 MCG (5000 UT) TABS Take 5,000 Units by mouth daily.    [provider]  hydrOXYzine (VISTARIL) 50 MG capsule Take 1 capsule (50 mg total) by mouth 3 (three) times daily as needed for anxiety. 11/25/18   Eileen Stanford, PA-C  OVER THE COUNTER MEDICATION Apply 1 application topically daily. Cortisone 10 to scalp    [provider]  pantoprazole (PROTONIX) 40 MG tablet 40 mg BID x 4 weeks then daily 06/20/20   Eulogio Bear U, DO  Salicylic Acid (GOLD BOND PSORIASIS RELIEF EX) Apply 1 application topically daily. scalp    [provider]  simvastatin (ZOCOR) 20 MG tablet Take 20 mg by mouth daily.    [provider]  sucralfate (CARAFATE) 1 GM/10ML suspension Take 10 mLs (1 g total) by mouth 4 (four) times daily -  with meals and at bedtime. 06/20/20   Geradine Girt, DO  vitamin B-12 (CYANOCOBALAMIN) 500 MCG tablet Take 500 mcg by mouth daily.    [provider]  vitamin E 180 MG (400 UNITS) capsule Take 400 Units by mouth daily.    [provider]    Allergies    Patient has no known allergies.  Review of Systems   Review of Systems  Constitutional: Negative for appetite change.  HENT: Negative for congestion.   Respiratory: Positive for shortness of breath. Negative for wheezing.   Cardiovascular: Positive for chest pain.  Gastrointestinal: Positive for blood in stool and diarrhea.  Genitourinary: Negative for pelvic pain.  Skin: Negative for rash.  Neurological: Negative for weakness.  Psychiatric/Behavioral: Negative for confusion.    Physical Exam Updated Vital Signs BP (!) 162/78   Pulse 68   Temp 98.4 F (36.9 C) (Oral)   Resp (!) 23   SpO2 94%   Physical Exam Vitals and nursing Jimenez reviewed.  HENT:     Head: Atraumatic.  Eyes:  Extraocular Movements: Extraocular movements intact.  Cardiovascular:     Rate and Rhythm: Normal rate and regular rhythm.  Pulmonary:     Breath sounds: Wheezing present.     Comments: Diffuse wheezes mildly prolonged breath sounds. Chest:     Chest wall: No tenderness.  Abdominal:     Tenderness: There is no abdominal tenderness.  Musculoskeletal:     Cervical back: Normal range of motion.     Right lower leg: No edema.     Left lower leg: No edema.  Skin:    Coloration: Skin is not cyanotic.  Neurological:     Mental Status: She is alert and oriented to person, place, and time.     ED Results / Procedures / Treatments   Labs (all labs ordered are listed, but only abnormal results are displayed) Labs Reviewed  BASIC METABOLIC PANEL - Abnormal; Notable for the following components:      Result Value   Glucose, Bld 118 (*)    All other components within normal limits  CBC - Abnormal; Notable for the following components:   RBC 3.86 (*)    Hemoglobin 11.9 (*)    RDW 15.7 (*)    All other components within normal limits  D-DIMER, QUANTITATIVE - Abnormal; Notable for the following components:   D-Dimer, Quant 1.74 (*)    All other components  within normal limits  TROPONIN I (HIGH SENSITIVITY) - Abnormal; Notable for the following components:   Troponin I (High Sensitivity) 33 (*)    All other components within normal limits  TROPONIN I (HIGH SENSITIVITY) - Abnormal; Notable for the following components:   Troponin I (High Sensitivity) 36 (*)    All other components within normal limits  POC OCCULT BLOOD, ED    EKG EKG Interpretation  Date/Time:  Wednesday July 04 2020 13:12:52 EDT Ventricular Rate:  71 PR Interval:  228 QRS Duration: 158 QT Interval:  460 QTC Calculation: 499 R Axis:   -85 Text Interpretation: Atrial-sensed ventricular-paced rhythm with prolonged AV conduction Abnormal ECG Confirmed by Davonna Belling [phone]) on 07/04/2020 3:27:32 PM   Radiology DG Chest 2 View  Result Date: 07/04/2020 CLINICAL DATA:  Shortness of breath and chest pain. EXAM: CHEST - 2 VIEW COMPARISON:  06/19/2020 FINDINGS: Left chest wall pacer device is noted with lead in the right atrial appendage and right ventricle. Normal heart size. Aortic atherosclerosis. No pleural effusion. No airspace consolidation, atelectasis or pneumothorax. IMPRESSION: 1. No acute cardiopulmonary abnormalities. 2.  Aortic Atherosclerosis (ICD10-I70.0). Electronically Signed   By: Kerby Moors M.D.   On: 07/04/2020 13:57   CT Angio Chest PE W and/or Wo Contrast  Result Date: 07/04/2020 CLINICAL DATA:  Shortness of breath.  Pulmonary embolus suspected. EXAM: CT ANGIOGRAPHY CHEST WITH CONTRAST TECHNIQUE: Multidetector CT imaging of the chest was performed using the standard protocol during bolus administration of intravenous contrast. Multiplanar CT image reconstructions and MIPs were obtained to evaluate the vascular anatomy. CONTRAST:  28mL OMNIPAQUE IOHEXOL 350 MG/ML SOLN COMPARISON:  Chest x-ray 07/04/2020, CT angio chest 11/18/2018 FINDINGS: Cardiovascular: Left chest wall cardiac pacemaker with 2 leads in grossly appropriate position. Satisfactory  opacification of the pulmonary arteries to the segmental level. No evidence of pulmonary embolism. Normal heart size. No significant pericardial effusion. The thoracic aorta is normal in caliber. Moderate to severe atherosclerotic plaque of the thoracic aorta. Aortic valve replacement. At least 3 vessel moderate coronary artery calcifications. Mediastinum/Nodes: No enlarged mediastinal, hilar, or axillary lymph nodes. Thyroid gland, trachea,  and esophagus demonstrate no significant findings. Lungs/Pleura: Bilateral lower lobe subsegmental atelectasis. No focal consolidation. No pulmonary nodule or mass. Trace bilateral pleural effusions. No pneumothorax. Upper Abdomen: No acute abnormality. Musculoskeletal: No chest wall abnormality. No suspicious lytic or blastic osseous lesions. No acute displaced fracture. Multilevel mild degenerative changes of the spine. Review of the MIP images confirms the above findings. IMPRESSION: 1. No pulmonary embolus. 2. No acute intrathoracic abnormality other than trace bilateral pleural effusions. Electronically Signed   By: Iven Finn M.D.   On: 07/04/2020 18:35    Procedures Procedures   Medications Ordered in ED Medications  albuterol (VENTOLIN HFA) 108 (90 Base) MCG/ACT inhaler 2 puff (has no administration in time range)  iohexol (OMNIPAQUE) 350 MG/ML injection 75 mL (50 mLs Intravenous Contrast Given 07/04/20 1750)    ED Course  I have reviewed the triage vital signs and the nursing notes.  Pertinent labs & imaging results that were available during my care of the patient were reviewed by me and considered in my medical decision making (see chart for details).    MDM Rules/Calculators/A&P                         Patient presents with shortness of breath fatigue.  Has had last few days.  Reportedly has difficulty walking even 10 steps because she gets more short of breath.  Recent admission for GI bleeds.  Is on treatment for H. pylori at this time.   States stools have gotten black and she has more diarrhea.  However hemoglobin is increased.  D-dimer done and is elevated.  CT scan done and did not show pulmonary embolism.  Troponin mildly elevated.  EKG is paced.  Troponin stable but mildly elevated.  With increasing fatigue shortness of breath will admit to hospitalist.  No pulmonary embolism.  Hemoglobin decreased but will check guaiac.    Final Clinical Impression(s) / ED Diagnoses Final diagnoses:  Dyspnea, unspecified type  Elevated troponin    Rx / DC Orders ED Discharge Orders    None       Davonna Belling, MD 07/04/20 1920

## 2020-07-04 NOTE — ED Notes (Signed)
Patient transported to CT 

## 2020-07-05 ENCOUNTER — Observation Stay (HOSPITAL_COMMUNITY): Payer: Medicare HMO

## 2020-07-05 ENCOUNTER — Other Ambulatory Visit: Payer: Self-pay

## 2020-07-05 ENCOUNTER — Observation Stay (HOSPITAL_BASED_OUTPATIENT_CLINIC_OR_DEPARTMENT_OTHER): Payer: Medicare HMO

## 2020-07-05 DIAGNOSIS — B9681 Helicobacter pylori [H. pylori] as the cause of diseases classified elsewhere: Secondary | ICD-10-CM | POA: Diagnosis not present

## 2020-07-05 DIAGNOSIS — R7989 Other specified abnormal findings of blood chemistry: Secondary | ICD-10-CM

## 2020-07-05 DIAGNOSIS — R0602 Shortness of breath: Secondary | ICD-10-CM

## 2020-07-05 DIAGNOSIS — K269 Duodenal ulcer, unspecified as acute or chronic, without hemorrhage or perforation: Secondary | ICD-10-CM

## 2020-07-05 DIAGNOSIS — R195 Other fecal abnormalities: Secondary | ICD-10-CM | POA: Diagnosis not present

## 2020-07-05 LAB — CBC
HCT: 35.4 % — ABNORMAL LOW (ref 36.0–46.0)
Hemoglobin: 11.4 g/dL — ABNORMAL LOW (ref 12.0–15.0)
MCH: 30.4 pg (ref 26.0–34.0)
MCHC: 32.2 g/dL (ref 30.0–36.0)
MCV: 94.4 fL (ref 80.0–100.0)
Platelets: 198 10*3/uL (ref 150–400)
RBC: 3.75 MIL/uL — ABNORMAL LOW (ref 3.87–5.11)
RDW: 15.9 % — ABNORMAL HIGH (ref 11.5–15.5)
WBC: 4.6 10*3/uL (ref 4.0–10.5)
nRBC: 0 % (ref 0.0–0.2)

## 2020-07-05 LAB — TSH: TSH: 2.249 u[IU]/mL (ref 0.350–4.500)

## 2020-07-05 LAB — C DIFFICILE QUICK SCREEN W PCR REFLEX
C Diff antigen: NEGATIVE
C Diff interpretation: NOT DETECTED
C Diff toxin: NEGATIVE

## 2020-07-05 MED ORDER — PANTOPRAZOLE SODIUM 40 MG PO TBEC: 40.0000 mg | DELAYED_RELEASE_TABLET | Freq: Every day | ORAL | Status: DC

## 2020-07-05 MED ORDER — CITALOPRAM HYDROBROMIDE 20 MG PO TABS
20.0000 mg | ORAL_TABLET | Freq: Every day | ORAL | Status: DC
  Administered 2020-07-06: 20 mg via ORAL
  Filled 2020-07-05: qty 1

## 2020-07-05 MED ORDER — PANTOPRAZOLE SODIUM 40 MG PO TBEC
40.0000 mg | DELAYED_RELEASE_TABLET | Freq: Two times a day (BID) | ORAL | Status: DC
  Administered 2020-07-05 – 2020-07-06 (×2): 40 mg via ORAL
  Filled 2020-07-05 (×2): qty 1

## 2020-07-05 NOTE — Consult Note (Addendum)
Referring Provider: Dr. Shelly Coss  Primary Care Physician:  Leonard Downing, MD Primary Gastroenterologist:  Dr. Carlean Purl   Reason for Consultation:  Melena  HPI: [name] is a 85 y.o. female [name] is a 85 y.o. female with a past medical history of anxiety, depression, hypertension, hyperlipidemia, complete heart block s/p pacemaker, severe aortic stenosis s/p TAVR 11/2018, carotid artery disease and endometrial adeno carcinoma in 2011. She was admitted to [facility] 06/18/2020 with a upper GI bleed and melena.  Hemoglobin level was 11.3 with a baseline hemoglobin 12.1.  Underwent an EGD 3/22 by Dr. Rush Landmark which identified a 3 cm hiatal hernia, gastritis and a nonbleeding duodenal ulcer.  She was discharged home on 3/23.  She was contacted as an outpatient regarding EGD biopsy result which was positive for Helicobacter pylori. She was prescribed Tetracycline 500 mg 4 times daily, Metronidazole 500 mg 4 times daily, Pantoprazole 40 mg twice daily on 3/31.  Pepto bismol qid was started a few days later, she stated she took Pepto for only one day which she thinks was on Tuesday 4/5. She presented to the ED 07/04/2020 with recurrence of black tarry stools. She reported passing black tarry loose stools with associated SOB x 2 days.  Labs in the ED showed a stable hemoglobin level of 11.9.  FOBT positive.  D-dimer was elevated.  CTA without evidence of a PE.  Troponin level was mildly elevated.  EKG consistent with a pacemaker.  Currently, she is sitting in the recliner.  No chest pain or shortness of breath.  No nausea or vomiting.  No abdominal pain.  No bowel movement overnight or thus far today.  Rectal exam at this time showed a small amount of dark or greenish liquid stool in the rectal vault which was guaiac negative.  She is hemodynamically stable.  EGD 06/19/2020 by Dr. Rush Landmark: No gross lesions in esophagus. Z-line irregular, 38 cm from the incisors. - 3 cm  hiatal hernia. - Gastritis. Biopsied. - Non-bleeding duodenal ulcer with a clean ulcer base (Forrest Class III) in the bulb. - No other gross lesions in the duodenal bulb, in the first portion of the duodenum and in the second portion of the duodenum. Biopsy Report: A. STOMACH, BIOPSY:  - Chronic active gastritis  - No intestinal metaplasia identified H. pylori immunohistochemistry is POSITIVE for microorganisms.   Colonoscopy in 2012: showed severe diverticulosis in the sigmoid colon and external hemorrhoids.     Past Medical History:  Diagnosis Date  . Anxiety    Stable  . ASCUS on Pap smear 11/15/09  . Carotid artery disease (HCC)    1-39% RICA stenosis and 10-25% LICA stenosis on pre TAVR dopplers   . Depression   . Duodenal ulcer due to Helicobacter pylori 8/52/7782  . Endometrial polyp 11/15/2009  . Endometrioid adenocarcinoma   . H/O varicella   . Hemorrhoids   . History of measles, mumps, or rubella   . Hypercholesterolemia   . Hypertension   . Menopausal symptoms 06/13/10  . Ovarian cyst 11/15/09  . PMB (postmenopausal bleeding) 11/15/09  . Presence of permanent cardiac pacemaker 10/20/2018  . Vaginal bleeding     Past Surgical History:  Procedure Laterality Date  . BIOPSY  06/19/2020   Procedure: BIOPSY;  Surgeon: Rush Landmark Telford Nab., MD;  Location: [place];  Service: Gastroenterology;;  . BREAST SURGERY     Over twenty - five  years ago  . CATARACT EXTRACTION W/ INTRAOCULAR LENS  IMPLANT Bilateral   . CERVICAL BIOPSY    . ESOPHAGOGASTRODUODENOSCOPY (EGD) WITH PROPOFOL N/A 06/19/2020   Procedure: ESOPHAGOGASTRODUODENOSCOPY (EGD) WITH PROPOFOL;  Surgeon: Rush Landmark Telford Nab., MD;  Location: [place];  Service: Gastroenterology;  Laterality: N/A;  . EYE SURGERY    . INSERT / REPLACE / REMOVE PACEMAKER  10/20/2018  . LAPAROSCOPIC ASSISTED VAGINAL HYSTERECTOMY  12/2009   BSO  . MULTIPLE EXTRACTIONS WITH ALVEOLOPLASTY N/A 12/09/2018   Procedure:  EXTRACTION OF TOOTH #'S 6,21-29 AND 32 WITH ALVEOLOPLASTY AND BILATERAL MANDIBULAR TORI REDUCTIONS;  Surgeon: Lenn Cal, DDS;  Location: [place];  Service: Oral Surgery;  Laterality: N/A;  . PACEMAKER IMPLANT N/A 10/20/2018   Procedure: PACEMAKER IMPLANT;  Surgeon: Deboraha Sprang, MD;  Location: [place] [facility];  Service: Cardiovascular;  Laterality: N/A;  . RIGHT/LEFT HEART CATH AND CORONARY ANGIOGRAPHY N/A 11/24/2018   Procedure: RIGHT/LEFT HEART CATH AND CORONARY ANGIOGRAPHY;  Surgeon: Burnell Blanks, MD;  Location: [place] [facility];  Service: Cardiovascular;  Laterality: N/A;  . TEE WITHOUT CARDIOVERSION N/A 12/21/2018   Procedure: TRANSESOPHAGEAL ECHOCARDIOGRAM (TEE);  Surgeon: Burnell Blanks, MD;  Location: [place];  Service: Open Heart Surgery;  Laterality: N/A;    Prior to Admission medications   Medication Sig Start Date End Date Taking? Authorizing Provider  acetaminophen (TYLENOL) 500 MG tablet Take 500 mg by mouth at bedtime.   Yes [provider]  amLODipine (NORVASC) 10 MG tablet Take 10 mg by mouth daily.   Yes [provider]  aspirin 81 MG chewable tablet Chew 1 tablet (81 mg total) by mouth daily. 12/23/18  Yes Barrett, Erin R, PA-C  Bismuth Subsalicylate (PEPTO-BISMOL PO) Take 8 tablets by mouth daily.   Yes [provider]  carboxymethylcellulose (REFRESH PLUS) 0.5 % SOLN Place 1 drop into both eyes daily as needed (dry eyes).    Yes [provider]  Cholecalciferol (VITAMIN D-3) 125 MCG (5000 UT) TABS Take 5,000 Units by mouth daily.   Yes [provider]  citalopram (CELEXA) 40 MG tablet Take 40 mg by mouth daily.   Yes [provider]  hydrOXYzine (VISTARIL) 50 MG capsule Take 1 capsule (50 mg total) by mouth 3 (three) times daily as needed for anxiety. 11/25/18  Yes Eileen Stanford, PA-C  metroNIDAZOLE (FLAGYL) 500 MG tablet Take 500 mg by mouth every 8 (eight) hours. Start date:06/25/20  06/25/20  Yes [provider]  Omega-3 Fatty Acids (FISH OIL) 1000 MG CAPS Take 1,000 mg by mouth daily.   Yes [provider]  OVER THE COUNTER MEDICATION Apply 1 application topically daily. Cortisone 10 to scalp   Yes [provider]  simvastatin (ZOCOR) 20 MG tablet Take 20 mg by mouth daily.   Yes [provider]  sucralfate (CARAFATE) 1 GM/10ML suspension Take 10 mLs (1 g total) by mouth 4 (four) times daily -  with meals and at bedtime. 06/20/20  Yes Geradine Girt, DO  tetracycline (SUMYCIN) 500 MG capsule Take 500 mg by mouth every 6 (six) hours. Start date : 06/25/20 06/26/20  Yes [provider]  vitamin B-12 (CYANOCOBALAMIN) 500 MCG tablet Take 500 mcg by mouth daily.   Yes [provider]  vitamin E 180 MG (400 UNITS) capsule Take 400 Units by mouth daily.   Yes [provider]  amoxicillin (AMOXIL) 500 MG tablet Take 4 capsules (2,000 mg) one hour prior to all dental visits. Patient not taking: Reported on 07/04/2020 12/29/18  Eileen Stanford, PA-C  pantoprazole (PROTONIX) 40 MG tablet 40 mg BID x 4 weeks then daily Patient not taking: No sig reported 06/20/20   Geradine Girt, DO    Current Facility-Administered Medications  Medication Dose Route Frequency Provider Last Rate Last Admin  . acetaminophen (TYLENOL) tablet 650 mg  650 mg Oral Q6H PRN Shela Leff, MD       Or  . acetaminophen (TYLENOL) suppository 650 mg  650 mg Rectal Q6H PRN Shela Leff, MD      . albuterol (VENTOLIN HFA) 108 (90 Base) MCG/ACT inhaler 2 puff  2 puff Inhalation Q4H PRN Shela Leff, MD      . amLODipine (NORVASC) tablet 10 mg  10 mg Oral Daily Shela Leff, MD   10 mg at 07/05/20 0045  . bismuth subsalicylate (PEPTO BISMOL) chewable tablet 524 mg  524 mg Oral QID PRN Shela Leff, MD      . citalopram (CELEXA) tablet 40 mg  40 mg Oral Daily Shela Leff, MD   40 mg at 07/05/20 0045  . hydrOXYzine  (ATARAX/VISTARIL) tablet 50 mg  50 mg Oral TID PRN Shela Leff, MD      . metroNIDAZOLE (FLAGYL) tablet 500 mg  500 mg Oral Q8H Shela Leff, MD   500 mg at 07/05/20 2947  . pantoprazole (PROTONIX) EC tablet 40 mg  40 mg Oral Daily Shela Leff, MD      . simvastatin (ZOCOR) tablet 20 mg  20 mg Oral Daily Shela Leff, MD   20 mg at 07/05/20 0046  . sucralfate (CARAFATE) 1 GM/10ML suspension 1 g  1 g Oral TID WC & HS Shela Leff, MD   1 g at 07/05/20 0048  . tetracycline (SUMYCIN) capsule 500 mg  500 mg Oral Q6H Shela Leff, MD   500 mg at 07/05/20 0046    Allergies as of 07/04/2020  . (No Known Allergies)    Family History  Problem Relation Age of Onset  . Lung cancer Brother   . Heart disease Brother   . Heart failure Mother   . Parkinson's disease Father     Social History   Socioeconomic History  . Marital status: Legally Separated    Spouse name: Not on file  . Number of children: Not on file  . Years of education: Not on file  . Highest education level: Not on file  Occupational History  . Occupation: Retired-sales rep  Tobacco Use  . Smoking status: Current Every Day Smoker    Packs/day: 0.50    Years: 55.00    Pack years: 27.50    Types: Cigarettes  . Smokeless tobacco: Never Used  . Tobacco comment: 3 cig/day  Vaping Use  . Vaping Use: Never used  Substance and Sexual Activity  . Alcohol use: No  . Drug use: Not Currently  . Sexual activity: Not on file  Other Topics Concern  . Not on file  Social History Narrative  . Not on file   Social Determinants of Health   Financial Resource Strain: Not on file  Food Insecurity: Not on file  Transportation Needs: Not on file  Physical Activity: Not on file  Stress: Not on file  Social Connections: Not on file  Intimate Partner Violence: Not on file    Review of Systems: See HPI, all other systems reviewed and are negative . Physical Exam: Vital signs in last 24  hours: Temp:  [98.1 F (36.7 C)-98.4 F (36.9 C)] 98.1 F (36.7 C) (  04/07 0820) Pulse Rate:  [58-100] 80 (04/07 0820) Resp:  [16-23] 17 (04/07 0820) BP: (126-162)/(75-93) 138/84 (04/07 0820) SpO2:  [91 %-97 %] 91 % (04/07 0820) Weight:  [49.2 kg] 49.2 kg (04/07 0129) Last BM Date: 07/04/20 General:  Alert,  well-developed, well-nourished, pleasant and cooperative in NAD. Head:  Normocephalic and atraumatic. Eyes:  No scleral icterus. Conjunctiva pink. Ears:  Normal auditory acuity. Nose:  No deformity, discharge or lesions. Mouth:  Dentition intact. No ulcers or lesions.  Neck:  Supple. No lymphadenopathy or thyromegaly.  Lungs: Breath sounds clear throughout. Heart: Rate and rhythm, no murmurs.  Pacemaker generator to the left subclavian area. Abdomen:   Rectal: Inflamed external hemorrhoids.  Internal rectal exam showed loose dark greenish stool guaiac negative. Musculoskeletal:  Symmetrical without gross deformities.  Pulses:  Normal pulses noted. Extremities:  Without clubbing or edema. Neurologic:  Alert and  oriented x4. No focal deficits.  Skin:  Intact without significant lesions or rashes. Psych:  Alert and cooperative. Normal mood and affect.  Intake/Output from previous day: No intake/output data recorded. Intake/Output this shift: No intake/output data recorded.  Lab Results: Recent Labs    07/04/20 1319  WBC 6.1  HGB 11.9*  HCT 36.6  PLT 224   BMET Recent Labs    07/04/20 1319  NA 137  K 3.5  CL 105  CO2 27  GLUCOSE 118*  BUN 18  CREATININE 0.73  CALCIUM 9.0    Studies/Results: DG Chest 2 View  Result Date: 07/04/2020 CLINICAL DATA:  Shortness of breath and chest pain. EXAM: CHEST - 2 VIEW COMPARISON:  06/19/2020 FINDINGS: Left chest wall pacer device is noted with lead in the right atrial appendage and right ventricle. Normal heart size. Aortic atherosclerosis. No pleural effusion. No airspace consolidation, atelectasis or pneumothorax.  IMPRESSION: 1. No acute cardiopulmonary abnormalities. 2.  Aortic Atherosclerosis (ICD10-I70.0). Electronically Signed   By: Kerby Moors M.D.   On: 07/04/2020 13:57   CT Angio Chest PE W and/or Wo Contrast  Result Date: 07/04/2020 CLINICAL DATA:  Shortness of breath.  Pulmonary embolus suspected. EXAM: CT ANGIOGRAPHY CHEST WITH CONTRAST TECHNIQUE: Multidetector CT imaging of the chest was performed using the standard protocol during bolus administration of intravenous contrast. Multiplanar CT image reconstructions and MIPs were obtained to evaluate the vascular anatomy. CONTRAST:  32mL OMNIPAQUE IOHEXOL 350 MG/ML SOLN COMPARISON:  Chest x-ray 07/04/2020, CT angio chest 11/18/2018 FINDINGS: Cardiovascular: Left chest wall cardiac pacemaker with 2 leads in grossly appropriate position. Satisfactory opacification of the pulmonary arteries to the segmental level. No evidence of pulmonary embolism. Normal heart size. No significant pericardial effusion. The thoracic aorta is normal in caliber. Moderate to severe atherosclerotic plaque of the thoracic aorta. Aortic valve replacement. At least 3 vessel moderate coronary artery calcifications. Mediastinum/Nodes: No enlarged mediastinal, hilar, or axillary lymph nodes. Thyroid gland, trachea, and esophagus demonstrate no significant findings. Lungs/Pleura: Bilateral lower lobe subsegmental atelectasis. No focal consolidation. No pulmonary nodule or mass. Trace bilateral pleural effusions. No pneumothorax. Upper Abdomen: No acute abnormality. Musculoskeletal: No chest wall abnormality. No suspicious lytic or blastic osseous lesions. No acute displaced fracture. Multilevel mild degenerative changes of the spine. Review of the MIP images confirms the above findings. IMPRESSION: 1. No pulmonary embolus. 2. No acute intrathoracic abnormality other than trace bilateral pleural effusions. Electronically Signed   By: Iven Finn M.D.   On: 07/04/2020 18:35     IMPRESSION/PLAN:  50.  85 year old female with recently admitted to the hospital with UGI  bleed/melena and anemia. S/P EGD 06/19/2020 which identified a 3 cm hiatal hernia, gastritis and a nonbleeding duodenal ulcer.  She was discharged home on 3/23.  Biopsies were positive for H. pylori and she was prescribed Tetracycline/Metronidazole/Pantoprazole/Pepto qid on 3/31. She was readmitted to the hospital 4/6 with recurrent melena. + FOBT the ED. rectal exam at this time showed dark green liquid stool guaiac negative.  Stable Hg 11.9.  -CBC -Continue Pantoprazole 40mg  IV bid -Monitor patient closely for active GI bleeding  -No Pepto bismol  -Clear liquid diet now, may advance later today as tolerated  -No plans for EGD at this juncture as her hemoglobin is stable on repeat rectal exam showed guaiac negative stool -She will need a GI follow-up in our office and H. pylori stool antigen testing in 2 to 4 weeks to verify H. pylori eradication -Further recommendations per Dr. Fuller Plan  2. Diarrhea -Await C.diff PCR results, no specimen yet available to test  -Enteric precautions   3. History of severe AS s/pTAVR 11/2018  4. History of complete heart block s/p pacemaker   5. History of carotid artery disease on ASA  6. History of endometrial cancer    Noralyn Pick  07/05/2020, 9:38 AM     Attending Physician Note   I have taken a history, examined the patient and reviewed the chart. I agree with the Advanced Practitioner's note, impression and recommendations.  Recently hospitalized with an UGI bleed due to a clean based duodenal ulcer. H. pylori found on gastric biopsies and treatment was started several days ago. She developed diarrhea with dark stools. Hgb has increased since discharge to 11.9. Initially dark, heme pos stool noted however today stool is dark green, heme negative on DRE. Diarrhea has stopped today.   I suspect she has an antibiotic associated diarrhea with  dark stools from PeptoBismol. Intermittent occult blood in stool is likely from her duodenal ulcer however this does not appear to be an active GI bleed.    * Hold antibiotics for H pylori and retreat with a different regimen as an outpatient in a few weeks. Her primary GI physician will decide an another regimen as an outpatient.  * Check for C diff and treat if present  * Continue PPI bid now and at discharge * No plans for repeat EGD at this time * Clear liquid for now and advance as tolerated later today * GI signing off. Please call if needed.  * Outpatient GI follow up with Dr. Carlean Purl in 1 month.   Lucio Edward, MD FACG [phone]

## 2020-07-05 NOTE — Progress Notes (Signed)
Lower extremity venous bilateral study completed.   Please see CV Proc for preliminary results.   Cashton Hosley, RDMS, RVT  

## 2020-07-05 NOTE — Progress Notes (Signed)
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 Biopsy showing chronic active gastritis and no intestinal metaplasia; H. pylori immunohistochemistry was positive.  She was started on treatment for H. pylori.  On presentation hemoglobin was found to be stable in the range of 11.  Lab work showed mild elevated troponin, flat trend.  D-dimer mildly elevated.  Chest x-ray did not show pneumonia.  CT chest negative for PE.  Patient reported having black tarry stools at home and weakness.  Denies any alcohol or NSAID use.  FOBT positive.  Assessment & Plan:   Principal Problem:   Dark stools Active Problems:   HTN (hypertension)   HLD (hyperlipidemia)   Generalized weakness   DOE (dyspnea on exertion)   Melena/black tarry stools: Currently hemoglobin stable.She was recently admitted to the hospital 06/18/2020-06/20/2020 for melena/upper GI bleed and underwent EGD which revealed gastritis and a nonbleeding duodenal ulcer. Biopsy showing chronic active gastritis and no intestinal metaplasia; H. pylori immunohistochemistry was positive.  Takes aspirin at home.  Abdominal examination is benign.  FOBT found to be positive.  Currently on 14 days course of H. pylori treatment: Protonix, bismuth, metronidazole, tetracycline. Also reported loose stools/diarrhea: Checking C. Difficile GI recommended  to hold antibiotics for H. pylori and treated with a different regimen as an outpatient in a few weeks.  Her primary GI physician will decide on the regimen as an outpatient. Since hemoglobin has remained stable and there is no active bleeding, GI holding on any procedures for now.  Diet to be advanced from clear later today. Continue PPI twice daily.  Dyspnea on exertion/fatigue: No hypoxia or signs of respiratory distress.  Lungs are clear on auscultation.  CT angios negative for PE or pneumonia.  Imaging showed bilateral trace pleural effusion.  Checking echocardiogram.  PT/OT evaluation requested  Elevated D-dimer: CT angio negative for PE.  Checking lower extremity Doppler for ruling out DVT  Hypertension: On amlodipine  Hyperlipidemia: On Zocor  Coronary artery disease: Minimally elevated troponin, flat trend.  EKG did not show any ischemic changes.  Depression/anxiety: On Celexa, hydroxyzine at home           DVT prophylaxis:None Code Status: Full Family Communication: None at bedside Status is: Observation  The patient remains OBS appropriate and will d/c before 2 midnights.  Dispo: The patient is from: Home              Anticipated d/c is to: Home              Patient currently is not medically stable to d/c.   Difficult to place patient No     Consultants: GI  Procedures:None  Antimicrobials:  Anti-infectives (From admission, onward)   Start     Dose/Rate Route Frequency Ordered Stop   07/04/20 2200  metroNIDAZOLE (FLAGYL) tablet 500 mg       Note to Pharmacy: Start date:06/25/20     500 mg Oral Every 8 hours 07/04/20 2039  07/04/20 2045  tetracycline (SUMYCIN) capsule 500 mg       Note to Pharmacy: Start date : 06/25/20     500 mg Oral Every 6 hours 07/04/20 2039        Subjective: Patient seen and examined the bedside this morning.  Hemodynamically stable.  She was working with occupational therapy.  She was standing on her feet and was not  dyspneic.  Lungs are clear on auscultation.  She was not coughing or complaining of any shortness of breath but she is more concerned about dyspnea on exertion.  No evidence of peripheral edema  Objective: Vitals:   07/05/20 0030 07/05/20 0106 07/05/20 0127 07/05/20 0129  BP:  (!) 160/85 (!) 150/93   Pulse: 88 (!) 58 97   Resp: (!) 22 20 19    Temp:      TempSrc:      SpO2: 95% 97% 96%   Weight:    49.2 kg  Height:    5\' 4"  (1.626 m)   No intake or output data in the 24 hours ending 07/05/20 0750 Filed Weights   07/05/20 0129  Weight: 49.2 kg    Examination:  General exam: Overall comfortable, not in distress, pleasant elderly female HEENT: PERRL Respiratory system:  no wheezes or crackles  Cardiovascular system: S1 & S2 heard, RRR.  Gastrointestinal system: Abdomen is nondistended, soft and nontender. Central nervous system: Alert and oriented Extremities: No edema, no clubbing ,no cyanosis Skin: No rashes, no ulcers,no icterus   Data Reviewed: I have personally reviewed following labs and imaging studies  CBC: Recent Labs  Lab 07/04/20 1319  WBC 6.1  HGB 11.9*  HCT 36.6  MCV 94.8  PLT 283   Basic Metabolic Panel: Recent Labs  Lab 07/04/20 1319  NA 137  K 3.5  CL 105  CO2 27  GLUCOSE 118*  BUN 18  CREATININE 0.73  CALCIUM 9.0   GFR: Estimated Creatinine Clearance: 42.1 mL/min (by C-G formula based on SCr of 0.73 mg/dL). Liver Function Tests: No results for input(s): AST, ALT, ALKPHOS, BILITOT, PROT, ALBUMIN in the last 168 hours. No results for input(s): LIPASE, AMYLASE in the last 168 hours. No results for input(s): AMMONIA in the last 168 hours. Coagulation Profile: No results for input(s): INR, PROTIME in the last 168 hours. Cardiac Enzymes: No results for input(s): CKTOTAL, CKMB, CKMBINDEX, TROPONINI in the last 168 hours. BNP (last 3 results) No results for input(s): PROBNP in the last 8760 hours. HbA1C: No results for input(s): HGBA1C in the  last 72 hours. CBG: No results for input(s): GLUCAP in the last 168 hours. Lipid Profile: No results for input(s): CHOL, HDL, LDLCALC, TRIG, CHOLHDL, LDLDIRECT in the last 72 hours. Thyroid Function Tests: Recent Labs    07/05/20 0416  TSH 2.249   Anemia Panel: No results for input(s): VITAMINB12, FOLATE, FERRITIN, TIBC, IRON, RETICCTPCT in the last 72 hours. Sepsis Labs: No results for input(s): PROCALCITON, LATICACIDVEN in the last 168 hours.  Recent Results (from the past 240 hour(s))  Resp Panel by RT-PCR (Flu A&B, Covid) Nasopharyngeal Swab     Status: None   Collection Time: 07/04/20  7:33 PM   Specimen: Nasopharyngeal Swab; Nasopharyngeal(NP) swabs in vial transport medium  Result Value Ref Range Status   SARS Coronavirus 2 by RT PCR NEGATIVE NEGATIVE Final    Comment: (NOTE) SARS-CoV-2 target nucleic acids are NOT DETECTED.  The SARS-CoV-2 RNA is generally detectable in upper respiratory specimens during the acute phase of infection.  The lowest concentration of SARS-CoV-2 viral copies this assay can detect is 138 copies/mL. A negative result does not preclude SARS-Cov-2 infection and should not be used as the sole basis for treatment or other patient management decisions. A negative result may occur with  improper specimen collection/handling, submission of specimen other than nasopharyngeal swab, presence of viral mutation(s) within the areas targeted by this assay, and inadequate number of viral copies(<138 copies/mL). A negative result must be combined with clinical observations, patient history, and epidemiological information. The expected result is Negative.  Fact Sheet for Patients:  EntrepreneurPulse.com.au  Fact Sheet for Healthcare Providers:  IncredibleEmployment.be  This test is no t yet approved or cleared by the [place] FDA and  has been authorized for detection and/or diagnosis of SARS-CoV-2 by FDA under an  Emergency Use Authorization (EUA). This EUA will remain  in effect (meaning this test can be used) for the duration of the COVID-19 declaration under Section 564(b)(1) of the Act, 21 U.S.C.section 360bbb-3(b)(1), unless the authorization is terminated  or revoked sooner.       Influenza A by PCR NEGATIVE NEGATIVE Final   Influenza B by PCR NEGATIVE NEGATIVE Final    Comment: (NOTE) The Xpert Xpress SARS-CoV-2/FLU/RSV plus assay is intended as an aid in the diagnosis of influenza from Nasopharyngeal swab specimens and should not be used as a sole basis for treatment. Nasal washings and aspirates are unacceptable for Xpert Xpress SARS-CoV-2/FLU/RSV testing.  Fact Sheet for Patients: EntrepreneurPulse.com.au  Fact Sheet for Healthcare Providers: IncredibleEmployment.be  This test is not yet approved or cleared by the [place] FDA and has been authorized for detection and/or diagnosis of SARS-CoV-2 by FDA under an Emergency Use Authorization (EUA). This EUA will remain in effect (meaning this test can be used) for the duration of the COVID-19 declaration under Section 564(b)(1) of the Act, 21 U.S.C. section 360bbb-3(b)(1), unless the authorization is terminated or revoked.  Performed at [facility], [place] [address], [place], [place] [address]          Radiology Studies: DG Chest 2 View  Result Date: 07/04/2020 CLINICAL DATA:  Shortness of breath and chest pain. EXAM: CHEST - 2 VIEW COMPARISON:  06/19/2020 FINDINGS: Left chest wall pacer device is noted with lead in the right atrial appendage and right ventricle. Normal heart size. Aortic atherosclerosis. No pleural effusion. No airspace consolidation, atelectasis or pneumothorax. IMPRESSION: 1. No acute cardiopulmonary abnormalities. 2.  Aortic Atherosclerosis (ICD10-I70.0). Electronically Signed   By: Kerby Moors M.D.   On: 07/04/2020 13:57   CT Angio Chest PE W and/or Wo  Contrast  Result Date: 07/04/2020 CLINICAL DATA:  Shortness of breath.  Pulmonary embolus suspected. EXAM: CT ANGIOGRAPHY CHEST WITH CONTRAST TECHNIQUE: Multidetector CT imaging of the chest was performed using the standard protocol during bolus administration of intravenous contrast. Multiplanar CT image reconstructions and MIPs were obtained to evaluate the vascular anatomy. CONTRAST:  87mL OMNIPAQUE IOHEXOL 350 MG/ML SOLN COMPARISON:  Chest x-ray 07/04/2020, CT angio chest 11/18/2018 FINDINGS: Cardiovascular: Left chest wall cardiac pacemaker with 2 leads in grossly appropriate position. Satisfactory opacification of the pulmonary arteries to the segmental level. No evidence of pulmonary embolism. Normal heart size. No significant pericardial effusion. The thoracic aorta is normal in caliber. Moderate to severe atherosclerotic plaque of the thoracic aorta. Aortic valve replacement. At least 3 vessel moderate coronary artery calcifications. Mediastinum/Nodes: No enlarged mediastinal, hilar, or axillary lymph nodes. Thyroid gland, trachea, and esophagus demonstrate no significant findings. Lungs/Pleura: Bilateral  lower lobe subsegmental atelectasis. No focal consolidation. No pulmonary nodule or mass. Trace bilateral pleural effusions. No pneumothorax. Upper Abdomen: No acute abnormality. Musculoskeletal: No chest wall abnormality. No suspicious lytic or blastic osseous lesions. No acute displaced fracture. Multilevel mild degenerative changes of the spine. Review of the MIP images confirms the above findings. IMPRESSION: 1. No pulmonary embolus. 2. No acute intrathoracic abnormality other than trace bilateral pleural effusions. Electronically Signed   By: Iven Finn M.D.   On: 07/04/2020 18:35        Scheduled Meds: . amLODipine  10 mg Oral Daily  . citalopram  40 mg Oral Daily  . metroNIDAZOLE  500 mg Oral Q8H  . pantoprazole  40 mg Oral Daily  . simvastatin  20 mg Oral Daily  . sucralfate  1 g  Oral TID WC & HS  . tetracycline  500 mg Oral Q6H   Continuous Infusions:   LOS: 0 days    Time spent: 25 mins.More than 50% of that time was spent in counseling and/or coordination of care.      Shelly Coss, MD [facility] P4/09/2020, 7:50 AM

## 2020-07-05 NOTE — Progress Notes (Signed)
PT Cancellation Note  Patient Details Name: [name] MRN: [id number] DOB: [birth year]   Cancelled Treatment:    Reason Eval/Treat Not Completed: PT screened, no needs identified, will sign off pt seen by OT and was mod I, pt states she feels like she did before and stated no need for PT. Education to re consult of anything changes  Lyanne Co, DPT Acute Rehabilitation Services [phone]   [place] 07/05/2020, 10:35 AM

## 2020-07-05 NOTE — Progress Notes (Addendum)
Occupational Therapy Evaluation Patient Details Name: [name] MRN: [id number] DOB: 01-14-1936 Today's Date: 07/05/2020    History of Present Illness [name] is a 85 y.o. 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  She was recently admitted to the hospital 06/18/2020-06/20/2020 for melena/upper GI bleed and underwent EGD which revealed gastritis and a nonbleeding duodenal ulcer.  Stomach biopsy showing chronic active gastritis and no intestinal metaplasia; H. pylori immunohistochemistry was positive.  She was started on treatment for H. pylori.  Home aspirin was resumed.   Clinical Impression   Christy reported that she lived alone in her home independently prior to admission. She has two neighbors that check on her frequently, and help her do things around the house. She uses a meals on wheels service. [name] is currently completing all functional mobility and ADLs at baseline. During the session [name]'s O2 sat stayed between 91-95 on room air with activity. [name] competed all functional mobility and ADLs with supervision for safety. Pt educated on energy conservation strategies to prevent falls upon discharge home, pt was receptive. OT will discharge from acute services. Recommending tub seat for safe discharge back into the home. No OT follow up recommended.     Follow Up Recommendations  No OT follow up    Equipment Recommendations  Tub/shower seat       Precautions / Restrictions Precautions Precautions: None Restrictions Weight Bearing Restrictions: No      Mobility Bed Mobility Overal bed mobility: Independent                  Transfers Overall transfer level: Modified independent Equipment used:  Rolling walker (2 wheeled)             General transfer comment: Encouraged pt to use RW this session for safety. Pt marched in place and stood for several minutes without RW. Supervision for fucntional ambulation this session for safety.    Balance Overall balance assessment: Modified Independent (RW placed in front for support, pt stood fro several minutes without external device)                  ADL either performed or assessed with clinical judgement   ADL Overall ADL's : At baseline                   General ADL Comments: Pt was able to complete all ADLs with supervision only for safety, pt reported she feels as if she is at baseline.     Vision Baseline Vision/History: No visual deficits              Pertinent Vitals/Pain Pain Assessment: No/denies pain     Hand Dominance Right   Extremity/Trunk Assessment Upper Extremity Assessment Upper Extremity Assessment: Overall WFL for tasks assessed   Lower Extremity Assessment Lower Extremity Assessment: Overall WFL for tasks assessed       Communication Communication Communication: No difficulties   Cognition Arousal/Alertness: Awake/alert                 General Comments  Pts rear perineal area was red, pt complained of burning sensation around rectum, RN notified. Pt without skin issues on extremities.            Home Living Family/patient expects  to be discharged to:: Private residence Living Arrangements: Alone Available Help at Discharge: Neighbor Type of Home: House Home Access: Stairs to enter [employer] of Steps: 1 step Entrance Stairs-Rails: None Home Layout: One level     Bathroom Shower/Tub: Tub/shower unit (Pt reports she usually takes a bath in the tub)   Bathroom Toilet: Standard Bathroom Accessibility:  (No grab bars, pt reported she is able to get RW into bathroom)   Home Equipment: Walker - 2 wheels;Cane - single point          Prior  Functioning/Environment Level of Independence: Independent with assistive device(s)        Comments: Pt reported that her neighbors help her if needed, and check on her often.        OT Problem List: Decreased activity tolerance;Pain         OT Goals(Current goals can be found in the care plan section) Acute Rehab OT Goals Patient Stated Goal: To go back home  OT Frequency:      AM-PAC OT "6 Clicks" Daily Activity     Outcome Measure Help from another person eating meals?: A Little (Pt reports using meals on wheels service at baseline) Help from another person taking care of personal grooming?: None Help from another person toileting, which includes using toliet, bedpan, or urinal?: None Help from another person bathing (including washing, rinsing, drying)?: A Little Help from another person to put on and taking off regular upper body clothing?: None Help from another person to put on and taking off regular lower body clothing?: None 6 Click Score: 22   End of Session Equipment Utilized During Treatment: Rolling walker Nurse Communication:  (Funcational ambulation encouraged, real perineal check, cough present at base line. O2 stats stayed between 91-95 the entire session)  Activity Tolerance: Patient tolerated treatment well Patient left:    OT Visit Diagnosis: Muscle weakness (generalized) (M62.81);Pain;Unsteadiness on feet (R26.81) Pain - Right/Left:  (Rear perineal pain)                Time: 2549-8264 OT Time Calculation (min): 24 min Charges:  OT General Charges $OT Visit: 1 Visit OT Evaluation $OT Eval Low Complexity: 1 Low OT Treatments $Self Care/Home Management : 8-22 mins   [name] 07/05/2020, 10:32 AM

## 2020-07-06 ENCOUNTER — Telehealth: Payer: Self-pay | Admitting: *Deleted

## 2020-07-06 ENCOUNTER — Observation Stay (HOSPITAL_BASED_OUTPATIENT_CLINIC_OR_DEPARTMENT_OTHER): Payer: Medicare HMO

## 2020-07-06 DIAGNOSIS — R195 Other fecal abnormalities: Secondary | ICD-10-CM

## 2020-07-06 DIAGNOSIS — I2510 Atherosclerotic heart disease of native coronary artery without angina pectoris: Secondary | ICD-10-CM

## 2020-07-06 DIAGNOSIS — I351 Nonrheumatic aortic (valve) insufficiency: Secondary | ICD-10-CM | POA: Diagnosis not present

## 2020-07-06 DIAGNOSIS — R0602 Shortness of breath: Secondary | ICD-10-CM | POA: Diagnosis not present

## 2020-07-06 DIAGNOSIS — I5033 Acute on chronic diastolic (congestive) heart failure: Secondary | ICD-10-CM

## 2020-07-06 LAB — ECHOCARDIOGRAM COMPLETE
AR max vel: 2.14 cm2
AV Area VTI: 2.07 cm2
AV Area mean vel: 2.08 cm2
AV Mean grad: 7.3 mmHg
AV Peak grad: 15.7 mmHg
Ao pk vel: 1.98 m/s
Area-P 1/2: 5.84 cm2
Height: 64 in
P 1/2 time: 479 msec
S' Lateral: 2.7 cm
Weight: 1735.46 oz

## 2020-07-06 LAB — CBC WITH DIFFERENTIAL/PLATELET
Abs Immature Granulocytes: 0.01 10*3/uL (ref 0.00–0.07)
Basophils Absolute: 0 10*3/uL (ref 0.0–0.1)
Basophils Relative: 1 %
Eosinophils Absolute: 0.1 10*3/uL (ref 0.0–0.5)
Eosinophils Relative: 3 %
HCT: 33.6 % — ABNORMAL LOW (ref 36.0–46.0)
Hemoglobin: 10.9 g/dL — ABNORMAL LOW (ref 12.0–15.0)
Immature Granulocytes: 0 %
Lymphocytes Relative: 23 %
Lymphs Abs: 1.2 10*3/uL (ref 0.7–4.0)
MCH: 30.1 pg (ref 26.0–34.0)
MCHC: 32.4 g/dL (ref 30.0–36.0)
MCV: 92.8 fL (ref 80.0–100.0)
Monocytes Absolute: 0.6 10*3/uL (ref 0.1–1.0)
Monocytes Relative: 12 %
Neutro Abs: 3.1 10*3/uL (ref 1.7–7.7)
Neutrophils Relative %: 61 %
Platelets: 177 10*3/uL (ref 150–400)
RBC: 3.62 MIL/uL — ABNORMAL LOW (ref 3.87–5.11)
RDW: 15.5 % (ref 11.5–15.5)
WBC: 5 10*3/uL (ref 4.0–10.5)
nRBC: 0 % (ref 0.0–0.2)

## 2020-07-06 LAB — GLUCOSE, CAPILLARY: Glucose-Capillary: 111 mg/dL — ABNORMAL HIGH (ref 70–99)

## 2020-07-06 MED ORDER — ALBUTEROL SULFATE HFA 108 (90 BASE) MCG/ACT IN AERS
2.0000 | INHALATION_SPRAY | RESPIRATORY_TRACT | 1 refills | Status: DC | PRN
Start: 2020-07-06 — End: 2021-09-12

## 2020-07-06 MED ORDER — PANTOPRAZOLE SODIUM 40 MG PO TBEC: 40.0000 mg | DELAYED_RELEASE_TABLET | Freq: Two times a day (BID) | ORAL | 1 refills | Status: DC

## 2020-07-06 MED ORDER — FUROSEMIDE 10 MG/ML IJ SOLN
20.0000 mg | Freq: Once | INTRAMUSCULAR | Status: AC
  Administered 2020-07-06: 20 mg via INTRAVENOUS
  Filled 2020-07-06: qty 2

## 2020-07-06 MED ORDER — FUROSEMIDE 20 MG PO TABS: 20.0000 mg | ORAL_TABLET | Freq: Every day | ORAL | 0 refills | Status: DC

## 2020-07-06 NOTE — Progress Notes (Signed)
Patient discharged home. Requested walker and bedside commode. Supplies given. Discharge instructions given, all questions answered.

## 2020-07-06 NOTE — Progress Notes (Signed)
Tele Tech informed this RN patient V-Paced with Bundle Branch first degree Heart Block. Went in the room to check on the patient . ECHO test in progress. Attending aware. Will  Monitor.

## 2020-07-06 NOTE — Telephone Encounter (Signed)
Pt discharged from Hospital today.  Nursing to place [facility] to the pt on next business day on 07/09/20.  TOC appt is scheduled for 4/14 with Dayna Dunn PA-C at 1045.

## 2020-07-06 NOTE — Discharge Summary (Signed)
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 Biopsy showing chronic active gastritis and no intestinal metaplasia; H. pylori immunohistochemistry was positive. She was started on treatment for H. pylori.  On presentation hemoglobin was found to be stable in the range of 11.  Lab work showed mild elevated troponin, flat trend.  D-dimer mildly elevated.  Chest x-ray did not show pneumonia.  CT chest negative for PE.  Patient reported having black tarry stools at home and weakness.  Denies any alcohol or NSAID use.  FOBT positive.  GI consulted after admission.  Since her hemoglobin remained stable, GI did not recommend any further work-up.  Patient seen by PT/OT and did not recommend follow-up.  Echocardiogram showed EF of 50 to 55%, regional wall motion abnormality.  Cardiology consulted.  She was given a dose of IV Lasix 20 mg.  Cardiology recommended outpatient follow-up.  She is hemodynamically stable for discharge home today.  Following problems were addressed during hospitalization:  Melena/black tarry stools: Currently hemoglobin stable.She was recently admitted to the hospital 06/18/2020-06/20/2020 for melena/upper GI bleed  and underwent EGD which revealed gastritis and a nonbleeding duodenal ulcer. Biopsy showing chronic active gastritis and no intestinal metaplasia; H. pylori immunohistochemistry was positive.  Takes aspirin at home.  Abdominal examination is benign.  FOBT found to be positive.  She was on  on 14 days course of H. pylori treatment: Protonix, bismuth, metronidazole, tetracycline. GI recommended to hold antibiotics for H. pylori and treated with a different regimen as an outpatient in a few weeks.  Her primary GI physician will decide on the regimen as an outpatient. Since hemoglobin has remained stable and there is no active bleeding, GI holding on any procedures for now. Continue PPI twice daily.  Dyspnea on exertion/fatigue: No hypoxia or signs of respiratory distress.  Lungs are clear on auscultation.  CT angios negative for PE or pneumonia.  Imaging showed bilateral trace pleural effusion. .  Echocardiogram showed EF of 50 to 55%, regional wall motion abnormality, and also showed moderately elevated pulmonary artery systolic pressure.  Cardiology consulted.  She was given a dose of IV Lasix 20 mg.  Cardiology recommended outpatient follow-up.    PT/OT evaluation done, no follow up recommended  Elevated D-dimer: CT angio negative for PE. negative lower extremity Doppler for  DVT  Hypertension: On amlodipine  Hyperlipidemia: On Zocor  Coronary artery disease: Minimally elevated troponin, flat trend.  EKG did not show any ischemic changes.  Depression/anxiety: On Celexa, hydroxyzine at home  Smoker: Has been smoking for several years.  We recommend to do pulmonary function test as an outpatient.  Counseled for cessation  Discharge Diagnoses:  Principal Problem:   Dark stools Active Problems:   HTN (hypertension)   HLD (hyperlipidemia)   Generalized weakness   DOE (dyspnea on exertion)    Discharge Instructions  Discharge Instructions  Diet - low sodium heart healthy   Complete  by: As directed    Discharge instructions   Complete by: As directed    1)Please take prescribed medications as instructed 2)Follow up with cardiology as an outpatient on 07/12/20 3)Follow up with your PCP in 2 weeks 4)Follow up with your gastroenterologist as an outpatient   Increase activity slowly   Complete by: As directed    No wound care   Complete by: As directed      Allergies as of 07/06/2020   No Known Allergies     Medication List    STOP taking these medications   amoxicillin 500 MG tablet Commonly known as: AMOXIL   metroNIDAZOLE 500 MG tablet Commonly known as: FLAGYL   PEPTO-BISMOL PO   tetracycline 500 MG capsule Commonly known as: SUMYCIN     TAKE these medications   acetaminophen 500 MG tablet Commonly known as: TYLENOL Take 500 mg by mouth at bedtime.   albuterol 108 (90 Base) MCG/ACT inhaler Commonly known as: VENTOLIN HFA Inhale 2 puffs into the lungs every 4 (four) hours as needed for wheezing or shortness of breath.   amLODipine 10 MG tablet Commonly known as: NORVASC Take 10 mg by mouth daily.   aspirin 81 MG chewable tablet Chew 1 tablet (81 mg total) by mouth daily.   carboxymethylcellulose 0.5 % Soln Commonly known as: REFRESH PLUS Place 1 drop into both eyes daily as needed (dry eyes).   citalopram 40 MG tablet Commonly known as: CELEXA Take 40 mg by mouth daily.   Fish Oil 1000 MG Caps Take 1,000 mg by mouth daily.   furosemide 20 MG tablet Commonly known as: Lasix Take 1 tablet (20 mg total) by mouth daily for 4 days. Start taking on: July 07, 2020   hydrOXYzine 50 MG capsule Commonly known as: VISTARIL Take 1 capsule (50 mg total) by mouth 3 (three) times daily as needed for anxiety.   OVER THE COUNTER MEDICATION Apply 1 application topically daily. Cortisone 10 to scalp   pantoprazole 40 MG tablet Commonly known as: PROTONIX Take 1 tablet (40 mg total) by mouth 2 (two) times daily. What changed:   how much to  take  how to take this  when to take this  additional instructions   simvastatin 20 MG tablet Commonly known as: ZOCOR Take 20 mg by mouth daily.   sucralfate 1 GM/10ML suspension Commonly known as: CARAFATE Take 10 mLs (1 g total) by mouth 4 (four) times daily -  with meals and at bedtime.   vitamin B-12 500 MCG tablet Commonly known as: CYANOCOBALAMIN Take 500 mcg by mouth daily.   Vitamin D-3 125 MCG (5000 UT) Tabs Take 5,000 Units by mouth daily.   vitamin E 180 MG (400 UNITS) capsule Take 400 Units by mouth daily.       Follow-up Information    [facility] Follow up on 07/12/2020.   Specialty: Cardiology Why: Please arrive at your 15 min early to your appointment at 1045 AM, post hospital follow up with cardiology  Contact information: [address], [place] [address] [phone]       Leonard Downing, MD. Schedule an appointment as soon as possible for a visit in 2 week(s).   Specialty: Family Medicine Contact information: [place] [address] [phone]        Fay Records, MD .   Specialty: Cardiology Contact information: [phone]  [address] [place] [address] [phone]        Deboraha Sprang, MD .   Specialty: Cardiology Contact information: [phone] [address] [place] [address] [phone]              No Known Allergies  Consultations:  Cardiology ,GI   Procedures/Studies: DG Chest 1 View  Result Date: 06/19/2020 CLINICAL DATA:  Black tarry stool. EXAM: CHEST  1 VIEW COMPARISON:  12/21/2018 FINDINGS: Left chest wall pacer device is noted with leads in the right atrial appendage and right ventricle. Heart size appears normal. Aortic atherosclerosis. No pleural effusion or edema. No airspace opacities identified. The visualized osseous structures are unremarkable. IMPRESSION: No active cardiopulmonary abnormalities.  Electronically Signed   By: Kerby Moors M.D.   On: 06/19/2020 10:09   DG Chest 2 View  Result Date: 07/04/2020 CLINICAL DATA:  Shortness of breath and chest pain. EXAM: CHEST - 2 VIEW COMPARISON:  06/19/2020 FINDINGS: Left chest wall pacer device is noted with lead in the right atrial appendage and right ventricle. Normal heart size. Aortic atherosclerosis. No pleural effusion. No airspace consolidation, atelectasis or pneumothorax. IMPRESSION: 1. No acute cardiopulmonary abnormalities. 2.  Aortic Atherosclerosis (ICD10-I70.0). Electronically Signed   By: Kerby Moors M.D.   On: 07/04/2020 13:57   CT Angio Chest PE W and/or Wo Contrast  Result Date: 07/04/2020 CLINICAL DATA:  Shortness of breath.  Pulmonary embolus suspected. EXAM: CT ANGIOGRAPHY CHEST WITH CONTRAST TECHNIQUE: Multidetector CT imaging of the chest was performed using the standard protocol during bolus administration of intravenous contrast. Multiplanar CT image reconstructions and MIPs were obtained to evaluate the vascular anatomy. CONTRAST:  18mL OMNIPAQUE IOHEXOL 350 MG/ML SOLN COMPARISON:  Chest x-ray 07/04/2020, CT angio chest 11/18/2018 FINDINGS: Cardiovascular: Left chest wall cardiac pacemaker with 2 leads in grossly appropriate position. Satisfactory opacification of the pulmonary arteries to the segmental level. No evidence of pulmonary embolism. Normal heart size. No significant pericardial effusion. The thoracic aorta is normal in caliber. Moderate to severe atherosclerotic plaque of the thoracic aorta. Aortic valve replacement. At least 3 vessel moderate coronary artery calcifications. Mediastinum/Nodes: No enlarged mediastinal, hilar, or axillary lymph nodes. Thyroid gland, trachea, and esophagus demonstrate no significant findings. Lungs/Pleura: Bilateral lower lobe subsegmental atelectasis. No focal consolidation. No pulmonary nodule or mass. Trace bilateral pleural effusions. No pneumothorax. Upper Abdomen: No acute  abnormality. Musculoskeletal: No chest wall abnormality. No suspicious lytic or blastic osseous lesions. No acute displaced fracture. Multilevel mild degenerative changes of the spine. Review of the MIP images confirms the above findings. IMPRESSION: 1. No pulmonary embolus. 2. No acute intrathoracic abnormality other than trace bilateral pleural effusions. Electronically Signed   By: Iven Finn M.D.   On: 07/04/2020 18:35   ECHOCARDIOGRAM COMPLETE  Result Date: 07/06/2020    ECHOCARDIOGRAM REPORT   Patient Name:   [name] Date of Exam: 07/06/2020 Medical Rec #:  [id number]      Height:       64.0 in Accession #:    [id number]     Weight:       108.5 lb Date of Birth:  08/21/1935       BSA:          1.508 m Patient Age:    3 years       BP:           167/72 mmHg Patient Gender: F  HR:           97 bpm. Exam Location:  Inpatient Procedure: 2D Echo, Cardiac Doppler and Color Doppler Indications:    SOB  History:        Patient has prior history of Echocardiogram examinations, most                 recent 12/08/2019. Pacemaker; Risk Factors:Current Smoker,                 Dyslipidemia and Hypertension. 23 mm Edwards Sapien                 bioprosthetic, stented (TAVR) valve present. Procedure Date:                 12/21/18.  Sonographer:    Clayton Lefort RDCS (AE) Referring Phys: [id number] [facility]  Sonographer Comments: Technically difficult study due to poor echo windows and Technically challenging study due to limited acoustic windows. Attempts at apical images were made from subcostal window. IMPRESSIONS  1. Left ventricular ejection fraction, by estimation, is 50 to 55%. The left ventricle has low normal function. The left ventricle demonstrates regional wall motion abnormalities (see scoring diagram/findings for description). Apical hypokinesis. There is mild left ventricular hypertrophy. Left ventricular diastolic parameters are indeterminate.  2. Right ventricular systolic function is  normal. The right ventricular size is mildly enlarged. There is moderately elevated pulmonary artery systolic pressure. The estimated right ventricular systolic pressure is 78.9 mmHg.  3. The mitral valve is abnormal. Trivial mitral valve regurgitation. Severe mitral annular calcification.  4. The aortic valve has been repaired/replaced. There is a 23 mm Edwards Sapien prosthetic, stented (TAVR) valve present in the aortic position. Aortic valve regurgitation is mild to moderate. Mean gradient 8 mmHg, stable from prior echo 12/08/19  5. The inferior vena cava is dilated in size with <50% respiratory variability, suggesting right atrial pressure of 15 mmHg. FINDINGS  Left Ventricle: Left ventricular ejection fraction, by estimation, is 50 to 55%. The left ventricle has low normal function. The left ventricle demonstrates regional wall motion abnormalities. The left ventricular internal cavity size was normal in size. There is mild left ventricular hypertrophy. Left ventricular diastolic parameters are indeterminate.  LV Wall Scoring: The apical septal segment, apical anterior segment, and apex are akinetic. The mid anterior segment is hypokinetic. The entire lateral wall, anterior septum, entire inferior wall, mid inferoseptal segment, basal anterior segment, and basal inferoseptal segment are normal. Right Ventricle: The right ventricular size is mildly enlarged. No increase in right ventricular wall thickness. Right ventricular systolic function is normal. There is moderately elevated pulmonary artery systolic pressure. The tricuspid regurgitant velocity is 3.19 m/s, and with an assumed right atrial pressure of 15 mmHg, the estimated right ventricular systolic pressure is 38.1 mmHg. Left Atrium: Left atrial size was not well visualized. Right Atrium: Right atrial size was normal in size. Pericardium: There is no evidence of pericardial effusion. Mitral Valve: The mitral valve is abnormal. Severe mitral annular  calcification. Trivial mitral valve regurgitation. Tricuspid Valve: The tricuspid valve is normal in structure. Tricuspid valve regurgitation is trivial. Aortic Valve: The aortic valve has been repaired/replaced. Aortic valve regurgitation is mild to moderate. Aortic regurgitation PHT measures 479 msec. Aortic valve mean gradient measures 7.3 mmHg. Aortic valve peak gradient measures 15.7 mmHg. Aortic valve area, by VTI measures 2.07 cm. There is a 23 mm Edwards Sapien prosthetic, stented (TAVR) valve present in the aortic position. Pulmonic Valve: The pulmonic valve  was not well visualized. Pulmonic valve regurgitation is not visualized. Aorta: The aortic root is normal in size and structure. Venous: The inferior vena cava is dilated in size with less than 50% respiratory variability, suggesting right atrial pressure of 15 mmHg. IAS/Shunts: The interatrial septum was not well visualized.  LEFT VENTRICLE PLAX 2D LVIDd:         4.30 cm LVIDs:         2.70 cm LV PW:         1.10 cm LV IVS:        0.80 cm LVOT diam:     2.30 cm LV SV:         74 LV SV Index:   49 LVOT Area:     4.15 cm  RIGHT VENTRICLE             IVC RV S prime:     18.10 cm/s  IVC diam: 2.30 cm TAPSE (M-mode): 2.3 cm LEFT ATRIUM           Index       RIGHT ATRIUM          Index LA diam:      2.60 cm 1.72 cm/m  RA Area:     5.20 cm LA Vol (A4C): 41.4 ml 27.45 ml/m RA Volume:   8.14 ml  5.40 ml/m  AORTIC VALVE AV Area (Vmax):    2.14 cm AV Area (Vmean):   2.08 cm AV Area (VTI):     2.07 cm AV Vmax:           198.33 cm/s AV Vmean:          124.667 cm/s AV VTI:            0.356 m AV Peak Grad:      15.7 mmHg AV Mean Grad:      7.3 mmHg LVOT Vmax:         102.00 cm/s LVOT Vmean:        62.500 cm/s LVOT VTI:          0.177 m LVOT/AV VTI ratio: 0.50 AI PHT:            479 msec  AORTA Ao Root diam: 3.40 cm MITRAL VALVE                TRICUSPID VALVE MV Area (PHT): 5.84 cm     TR Peak grad:   40.7 mmHg MV Decel Time: 130 msec     TR Vmax:         319.00 cm/s MV E velocity: 149.00 cm/s                             SHUNTS                             Systemic VTI:  0.18 m                             Systemic Diam: 2.30 cm Oswaldo Milian MD Electronically signed by Oswaldo Milian MD Signature Date/Time: 07/06/2020/10:33:32 AM    Final    VAS [place] LOWER EXTREMITY VENOUS (DVT)  Result Date: 07/05/2020  Lower Venous DVT Study Indications: Elevated d-dimer, SOB.  Comparison Study: No prior studies. Performing Technologist: Darlin Coco RDMS,RVT  Examination Guidelines: A complete evaluation includes B-mode imaging, spectral  Doppler, color Doppler, and power Doppler as needed of all accessible portions of each vessel. Bilateral testing is considered an integral part of a complete examination. Limited examinations for reoccurring indications may be performed as noted. The reflux portion of the exam is performed with the patient in reverse Trendelenburg.  +---------+---------------+---------+-----------+----------+--------------+ RIGHT    CompressibilityPhasicitySpontaneityPropertiesThrombus Aging +---------+---------------+---------+-----------+----------+--------------+ CFV      Full           Yes      Yes                                 +---------+---------------+---------+-----------+----------+--------------+ SFJ      Full                                                        +---------+---------------+---------+-----------+----------+--------------+ FV Prox  Full                                                        +---------+---------------+---------+-----------+----------+--------------+ FV Mid   Full                                                        +---------+---------------+---------+-----------+----------+--------------+ FV DistalFull                                                        +---------+---------------+---------+-----------+----------+--------------+ PFV      Full                                                         +---------+---------------+---------+-----------+----------+--------------+ POP      Full           Yes      Yes                                 +---------+---------------+---------+-----------+----------+--------------+ PTV      Full                                                        +---------+---------------+---------+-----------+----------+--------------+ PERO     Full                                                        +---------+---------------+---------+-----------+----------+--------------+   +---------+---------------+---------+-----------+----------+--------------+  LEFT     CompressibilityPhasicitySpontaneityPropertiesThrombus Aging +---------+---------------+---------+-----------+----------+--------------+ CFV      Full           Yes      Yes                                 +---------+---------------+---------+-----------+----------+--------------+ SFJ      Full                                                        +---------+---------------+---------+-----------+----------+--------------+ FV Prox  Full                                                        +---------+---------------+---------+-----------+----------+--------------+ FV Mid   Full                                                        +---------+---------------+---------+-----------+----------+--------------+ FV DistalFull                                                        +---------+---------------+---------+-----------+----------+--------------+ PFV      Full                                                        +---------+---------------+---------+-----------+----------+--------------+ POP      Full           Yes      Yes                                 +---------+---------------+---------+-----------+----------+--------------+ PTV      Full                                                         +---------+---------------+---------+-----------+----------+--------------+ PERO     Full                                                        +---------+---------------+---------+-----------+----------+--------------+     Summary: RIGHT: - There is no evidence of deep vein thrombosis in the lower extremity.  - No cystic structure found in the popliteal fossa.  LEFT: - There is no evidence of deep vein thrombosis in the lower extremity.  - No  cystic structure found in the popliteal fossa.  *See table(s) above for measurements and observations. Electronically signed by Servando Snare MD on 07/05/2020 at 3:17:54 PM.    Final        Subjective: Patient seen and examined at the bedside this morning.  Hemodynamically stable for discharge.  Discharge Exam: Vitals:   07/06/20 0608 07/06/20 1401  BP: (!) 167/72 (!) 135/50  Pulse: 97 81  Resp: 18 17  Temp: 98.4 F (36.9 C) 98.1 F (36.7 C)  SpO2: 90% 92%   Vitals:   07/05/20 2112 07/05/20 2306 07/06/20 0608 07/06/20 1401  BP: 137/68 (!) 131/59 (!) 167/72 (!) 135/50  Pulse: 87 89 97 81  Resp: 18 18 18 17   Temp: 98.4 F (36.9 C) 98.2 F (36.8 C) 98.4 F (36.9 C) 98.1 F (36.7 C)  TempSrc: Oral Oral Oral Oral  SpO2: 93% 93% 90% 92%  Weight:      Height:        General: Pt is alert, awake, not in acute distress Cardiovascular: RRR, S1/S2 +, no rubs, no gallops Respiratory: CTA bilaterally, no wheezing, no rhonchi Abdominal: Soft, NT, ND, bowel sounds + Extremities: no edema, no cyanosis    The results of significant diagnostics from this hospitalization (including imaging, microbiology, ancillary and laboratory) are listed below for reference.     Microbiology: Recent Results (from the past 240 hour(s))  Resp Panel by RT-PCR (Flu A&B, Covid) Nasopharyngeal Swab     Status: None   Collection Time: 07/04/20  7:33 PM   Specimen: Nasopharyngeal Swab; Nasopharyngeal(NP) swabs in vial transport medium  Result Value Ref Range  Status   SARS Coronavirus 2 by RT PCR NEGATIVE NEGATIVE Final    Comment: (NOTE) SARS-CoV-2 target nucleic acids are NOT DETECTED.  The SARS-CoV-2 RNA is generally detectable in upper respiratory specimens during the acute phase of infection. The lowest concentration of SARS-CoV-2 viral copies this assay can detect is 138 copies/mL. A negative result does not preclude SARS-Cov-2 infection and should not be used as the sole basis for treatment or other patient management decisions. A negative result may occur with  improper specimen collection/handling, submission of specimen other than nasopharyngeal swab, presence of viral mutation(s) within the areas targeted by this assay, and inadequate number of viral copies(<138 copies/mL). A negative result must be combined with clinical observations, patient history, and epidemiological information. The expected result is Negative.  Fact Sheet for Patients:  EntrepreneurPulse.com.au  Fact Sheet for Healthcare Providers:  IncredibleEmployment.be  This test is no t yet approved or cleared by the [place] FDA and  has been authorized for detection and/or diagnosis of SARS-CoV-2 by FDA under an Emergency Use Authorization (EUA). This EUA will remain  in effect (meaning this test can be used) for the duration of the COVID-19 declaration under Section 564(b)(1) of the Act, 21 U.S.C.section 360bbb-3(b)(1), unless the authorization is terminated  or revoked sooner.       Influenza A by PCR NEGATIVE NEGATIVE Final   Influenza B by PCR NEGATIVE NEGATIVE Final    Comment: (NOTE) The Xpert Xpress SARS-CoV-2/FLU/RSV plus assay is intended as an aid in the diagnosis of influenza from Nasopharyngeal swab specimens and should not be used as a sole basis for treatment. Nasal washings and aspirates are unacceptable for Xpert Xpress SARS-CoV-2/FLU/RSV testing.  Fact Sheet for  Patients: EntrepreneurPulse.com.au  Fact Sheet for Healthcare Providers: IncredibleEmployment.be  This test is not yet approved or cleared by the [place] FDA and has been  authorized for detection and/or diagnosis of SARS-CoV-2 by FDA under an Emergency Use Authorization (EUA). This EUA will remain in effect (meaning this test can be used) for the duration of the COVID-19 declaration under Section 564(b)(1) of the Act, 21 U.S.C. section 360bbb-3(b)(1), unless the authorization is terminated or revoked.  Performed at [facility], [place] [address] [place], [place] [address]   C Difficile Quick Screen w PCR reflex     Status: None   Collection Time: 07/05/20  2:00 PM   Specimen: STOOL  Result Value Ref Range Status   C Diff antigen NEGATIVE NEGATIVE Final   C Diff toxin NEGATIVE NEGATIVE Final   C Diff interpretation No C. difficile detected.  Final    Comment: Performed at [facility], [place] [address] [place], [place] [address]     Labs: BNP (last 3 results) No results for input(s): BNP in the last 8760 hours. Basic Metabolic Panel: Recent Labs  Lab 07/04/20 1319  NA 137  K 3.5  CL 105  CO2 27  GLUCOSE 118*  BUN 18  CREATININE 0.73  CALCIUM 9.0   Liver Function Tests: No results for input(s): AST, ALT, ALKPHOS, BILITOT, PROT, ALBUMIN in the last 168 hours. No results for input(s): LIPASE, AMYLASE in the last 168 hours. No results for input(s): AMMONIA in the last 168 hours. CBC: Recent Labs  Lab 07/04/20 1319 07/05/20 1111 07/06/20 0308  WBC 6.1 4.6 5.0  NEUTROABS  --   --  3.1  HGB 11.9* 11.4* 10.9*  HCT 36.6 35.4* 33.6*  MCV 94.8 94.4 92.8  PLT 224 198 177   Cardiac Enzymes: No results for input(s): CKTOTAL, CKMB, CKMBINDEX, TROPONINI in the last 168 hours. BNP: Invalid input(s): POCBNP CBG: Recent Labs  Lab 07/06/20 0612  GLUCAP 111*   D-Dimer Recent Labs    07/04/20 1641  DDIMER 1.74*    Hgb A1c No results for input(s): HGBA1C in the last 72 hours. Lipid Profile No results for input(s): CHOL, HDL, LDLCALC, TRIG, CHOLHDL, LDLDIRECT in the last 72 hours. Thyroid function studies Recent Labs    07/05/20 0416  TSH 2.249   Anemia work up No results for input(s): VITAMINB12, FOLATE, FERRITIN, TIBC, IRON, RETICCTPCT in the last 72 hours. Urinalysis    Component Value Date/Time   COLORURINE YELLOW 12/17/2018 1422   APPEARANCEUR CLEAR 12/17/2018 1422   LABSPEC 1.018 12/17/2018 1422   PHURINE 5.0 12/17/2018 1422   GLUCOSEU NEGATIVE 12/17/2018 1422   HGBUR NEGATIVE 12/17/2018 1422   BILIRUBINUR NEGATIVE 12/17/2018 1422   KETONESUR 5 (A) 12/17/2018 1422   PROTEINUR NEGATIVE 12/17/2018 1422   NITRITE NEGATIVE 12/17/2018 1422   LEUKOCYTESUR NEGATIVE 12/17/2018 1422   Sepsis Labs Invalid input(s): PROCALCITONIN,  WBC,  LACTICIDVEN Microbiology Recent Results (from the past 240 hour(s))  Resp Panel by RT-PCR (Flu A&B, Covid) Nasopharyngeal Swab     Status: None   Collection Time: 07/04/20  7:33 PM   Specimen: Nasopharyngeal Swab; Nasopharyngeal(NP) swabs in vial transport medium  Result Value Ref Range Status   SARS Coronavirus 2 by RT PCR NEGATIVE NEGATIVE Final    Comment: (NOTE) SARS-CoV-2 target nucleic acids are NOT DETECTED.  The SARS-CoV-2 RNA is generally detectable in upper respiratory specimens during the acute phase of infection. The lowest concentration of SARS-CoV-2 viral copies this assay can detect is 138 copies/mL. A negative result does not preclude SARS-Cov-2 infection and should not be used as the sole basis for treatment or other patient management  decisions. A negative result may occur with  improper specimen collection/handling, submission of specimen other than nasopharyngeal swab, presence of viral mutation(s) within the areas targeted by this assay, and inadequate number of viral copies(<138 copies/mL). A negative result must be combined  with clinical observations, patient history, and epidemiological information. The expected result is Negative.  Fact Sheet for Patients:  EntrepreneurPulse.com.au  Fact Sheet for Healthcare Providers:  IncredibleEmployment.be  This test is no t yet approved or cleared by the [place] FDA and  has been authorized for detection and/or diagnosis of SARS-CoV-2 by FDA under an Emergency Use Authorization (EUA). This EUA will remain  in effect (meaning this test can be used) for the duration of the COVID-19 declaration under Section 564(b)(1) of the Act, 21 U.S.C.section 360bbb-3(b)(1), unless the authorization is terminated  or revoked sooner.       Influenza A by PCR NEGATIVE NEGATIVE Final   Influenza B by PCR NEGATIVE NEGATIVE Final    Comment: (NOTE) The Xpert Xpress SARS-CoV-2/FLU/RSV plus assay is intended as an aid in the diagnosis of influenza from Nasopharyngeal swab specimens and should not be used as a sole basis for treatment. Nasal washings and aspirates are unacceptable for Xpert Xpress SARS-CoV-2/FLU/RSV testing.  Fact Sheet for Patients: EntrepreneurPulse.com.au  Fact Sheet for Healthcare Providers: IncredibleEmployment.be  This test is not yet approved or cleared by the [place] FDA and has been authorized for detection and/or diagnosis of SARS-CoV-2 by FDA under an Emergency Use Authorization (EUA). This EUA will remain in effect (meaning this test can be used) for the duration of the COVID-19 declaration under Section 564(b)(1) of the Act, 21 U.S.C. section 360bbb-3(b)(1), unless the authorization is terminated or revoked.  Performed at [facility], [place] [address], [place], [place] [address]   C Difficile Quick Screen w PCR reflex     Status: None   Collection Time: 07/05/20  2:00 PM   Specimen: STOOL  Result Value Ref Range Status   C Diff antigen NEGATIVE NEGATIVE  Final   C Diff toxin NEGATIVE NEGATIVE Final   C Diff interpretation No C. difficile detected.  Final    Comment: Performed at [facility], [place] [address], [place], [place] [address]    Please note: You were cared for by a hospitalist during your hospital stay. Once you are discharged, your primary care physician will handle any further medical issues. Please note that NO REFILLS for any discharge medications will be authorized once you are discharged, as it is imperative that you return to your primary care physician (or establish a relationship with a primary care physician if you do not have one) for your post hospital discharge needs so that they can reassess your need for medications and monitor your lab values.    Time coordinating discharge: 40 minutes  SIGNED:   Shelly Coss, MD  [facility] 07/06/2020, 4:44 PM Pager [phone]  If 7PM-7AM, please contact night-coverage [email or web address] Password [email or web address]

## 2020-07-06 NOTE — Progress Notes (Signed)
  Echocardiogram 2D Echocardiogram has been performed.  [name] 07/06/2020, 9:51 AM

## 2020-07-06 NOTE — Telephone Encounter (Signed)
-----   Message from Margie Billet, NP sent at 07/06/2020  5:09 PM EDT ----- Regarding: TOC Call Hi, please place a TOC call for hospital follow up on 07/12/20 10:45AM with Dayna. Thank you.

## 2020-07-06 NOTE — Consult Note (Addendum)
Cardiology Consultation:   Patient ID: [name] MRN: [id number]; DOB: [birth year]  Admit date: 07/04/2020 Date of Consult: 07/06/2020  PCP:  Gina Downing, MD   [place]  Cardiologist:  Dr. Caryl Jimenez, Dr [name], and Dr [name]    Patient Profile:   [name] is a 85 y.o. female with a PMH of hypertension, hyperlipidemia, anxiety, depression, mild nonobstructive CAD, high grade AV block s/p PPM 10/20/2018, severe aortic stenosis s/p TAVR 12/21/2018, cartoid artery disease, hx of endometrial adenocarcinoma in 2011, tobacco abuse, who is currently admitted for melena. Cardiology consult is requested by hospitalist Dr. Shelly Jimenez.   History of Present Illness:   [name] follows with cardiologist  Dr. Caryl Jimenez, Dr [name], and Dr [name] primarily. She was hospitalized in July 2020 for near syncope due to 2:1 AV block with intermittent complete heart block. Echo 10/20/18 showed LV hyperdynamic systolic function, EF >93%. Severe aortic stenosis with a peak velocity of 4.2 m/s, mean gradient of 42 mmHg, and calculated aortic valve area of 0.48 cm. She underwent successful dual chamber St Jude PPM implant by Dr. Caryl Jimenez on 10/20/18. Subsequently she underwent right and left heart cath on 11/24/18 which showed mild non-obstructive CAD and severe AS with mean gradient 38.7 mmHg, peak to peak gradient 47 mmHg, AVA 0.48 cm2. On 12/21/2018 she underwent TAVR with a 5mm Edwards Sapien 3 Ultra THV via  right subclavian approach by Dr [name]. Post-op Echo 12/22/18 showed Aortic valve mean gradient measures 10.0 mmHg. She was discharged on DAPT with ASA and plavix. She did develop diffuse pruritic erythematous rash of her chest in one week following TAVR, which was felt due to allergy to Plavix. This was discontinued and she was continued on ASA indefinitely. She was last seen in the office on 12/08/19 for follow up, Echo repeat from 12/08/19 showed LVEF 55-60%, no RWMA, grade  I DD, peak RV-RA gradient 3mmHg, Bioprosthetic aortic valve s/p TAVR, 23 mm Edwards Sapien valve. Mean gradient 10 mmHg. No significant bioprosthetic valve stenosis or regurgitation.  She has been monitored routinely for her CAS, last duplex study was done on 12/26/19 showed right ICA 1-39% stenosis and left ICA 60-79% stenosis. Left systolic and diastolic velocities have increased comparing to pre-TAVR duplex done on 11/06/2018. Her PPM device check was last done on 04/24/20 which showed normal device function and 10 years 6 month battery life.   Patient had a recent hospitalization here from 06/18/20 to 06/20/20 for melena 2/2 upper GI bleed. Hgb dropped to 11 from baseline 12-13 on 11/2018. FOBT +. EGD was performed on 06/19/20 showed 3cm hiatal hernia, gastritis, non-bleeding duodenal ulcer with a Forrest Class III ulcer base in the bulb. She was treated with 48 hours duration of IV PPI before transition to PO PPI and Carafate. Subsequent H pylori biopsy was positive. She was started on PPI/Pepto Bismol/Metronidazole/doxycycline on 06/28/20 by GI. She was cleared for resuming her ASA at the time.   Patient returned to ED 07/04/20 with c/o fatigue, SOB, chest pain, black tarry stool. Diagnostic work up thus far showed Hgb 11.9 > 11.4 > 10.9, PLT WNL. BMP grossly unreamrkable. Hs Trop plataeu at 33 >36. Elevated D dimer 1.74. TSH WNL. Flu and COVID negative. FOBT +. C diff negative. CXR showed no acute finding. CTA showed no pulmonary embolus and no acute intrathoracic abnormality otherthan trace bilateral pleural effusions. EKG with paced rhythm. Doppler for BLE negative for DVT. Echo repeated today showed LVEF 50-55%,  apical hypokinesis, LV diastolic parameters are indeterminate, RV systolic function normal, moderately elevated pulmonary artery systolic pressure. The estimated RV systolic pressure is 76.1 mmHg. Trivial MR, severe mitral annular calcification, 23 mm Edwards Sapien prosthetic stented (TAVR) valve  present in the aortic position. Aortic valve regurgitation is mild to moderate. Mean gradient 8 mmHg, stable from prior echo 12/08/19. Cardiology is consulted for dyspnea on exertion and CHF. Furthermore, she was seen by GI again and started on IV Protonix 40mg   BID without plan for repeating EGD given stable Hgb.   Upon encounter, she states she has been feeling fatigued and weak since her last hospitalization in March 2022. She continue to have intermittent dark stools. She reports SOB with exertion. She endorse orthopnea and need to elevated her head while sleeping, but does not feel this is changed from her baseline. She reports occasional dizziness with position change. She reports some cough with phlegm production. She denied any chest pain, chest pressure, syncope, weight gain, or leg edema. She does not take any diuretic at baseline.  She is asking when can she be discharged home.   Past Medical History:  Diagnosis Date  . Anxiety    Stable  . ASCUS on Pap smear 11/15/09  . Carotid artery disease (HCC)    1-39% RICA stenosis and 95-09% LICA stenosis on pre TAVR dopplers   . Depression   . Duodenal ulcer due to Helicobacter pylori 06/24/7122  . Endometrial polyp 11/15/2009  . Endometrioid adenocarcinoma   . H/O varicella   . Hemorrhoids   . History of measles, mumps, or rubella   . Hypercholesterolemia   . Hypertension   . Menopausal symptoms 06/13/10  . Ovarian cyst 11/15/09  . PMB (postmenopausal bleeding) 11/15/09  . Presence of permanent cardiac pacemaker 10/20/2018  . Vaginal bleeding     Past Surgical History:  Procedure Laterality Date  . BIOPSY  06/19/2020   Procedure: BIOPSY;  Surgeon: Rush Landmark Telford Nab., MD;  Location: [place];  Service: Gastroenterology;;  . BREAST SURGERY     Over twenty - five  years ago  . CATARACT EXTRACTION W/ INTRAOCULAR LENS IMPLANT Bilateral   . CERVICAL BIOPSY    . ESOPHAGOGASTRODUODENOSCOPY (EGD) WITH PROPOFOL N/A 06/19/2020    Procedure: ESOPHAGOGASTRODUODENOSCOPY (EGD) WITH PROPOFOL;  Surgeon: Rush Landmark Telford Nab., MD;  Location: [place];  Service: Gastroenterology;  Laterality: N/A;  . EYE SURGERY    . INSERT / REPLACE / REMOVE PACEMAKER  10/20/2018  . LAPAROSCOPIC ASSISTED VAGINAL HYSTERECTOMY  12/2009   BSO  . MULTIPLE EXTRACTIONS WITH ALVEOLOPLASTY N/A 12/09/2018   Procedure: EXTRACTION OF TOOTH #'S 6,21-29 AND 32 WITH ALVEOLOPLASTY AND BILATERAL MANDIBULAR TORI REDUCTIONS;  Surgeon: Lenn Cal, DDS;  Location: [place];  Service: Oral Surgery;  Laterality: N/A;  . PACEMAKER IMPLANT N/A 10/20/2018   Procedure: PACEMAKER IMPLANT;  Surgeon: Deboraha Sprang, MD;  Location: [place] [facility];  Service: Cardiovascular;  Laterality: N/A;  . RIGHT/LEFT HEART CATH AND CORONARY ANGIOGRAPHY N/A 11/24/2018   Procedure: RIGHT/LEFT HEART CATH AND CORONARY ANGIOGRAPHY;  Surgeon: Burnell Blanks, MD;  Location: [place] [facility];  Service: Cardiovascular;  Laterality: N/A;  . TEE WITHOUT CARDIOVERSION N/A 12/21/2018   Procedure: TRANSESOPHAGEAL ECHOCARDIOGRAM (TEE);  Surgeon: Burnell Blanks, MD;  Location: [place];  Service: Open Heart Surgery;  Laterality: N/A;     Home Medications:  Prior to Admission medications   Medication Sig Start Date End Date Taking? Authorizing Provider  acetaminophen (TYLENOL) 500 MG  tablet Take 500 mg by mouth at bedtime.   Yes [provider]  amLODipine (NORVASC) 10 MG tablet Take 10 mg by mouth daily.   Yes [provider]  aspirin 81 MG chewable tablet Chew 1 tablet (81 mg total) by mouth daily. 12/23/18  Yes Barrett, Erin R, PA-C  Bismuth Subsalicylate (PEPTO-BISMOL PO) Take 8 tablets by mouth daily.   Yes [provider]  carboxymethylcellulose (REFRESH PLUS) 0.5 % SOLN Place 1 drop into both eyes daily as needed (dry eyes).    Yes [provider]  Cholecalciferol (VITAMIN D-3) 125 MCG (5000 UT) TABS Take 5,000 Units by mouth  daily.   Yes [provider]  citalopram (CELEXA) 40 MG tablet Take 40 mg by mouth daily.   Yes [provider]  hydrOXYzine (VISTARIL) 50 MG capsule Take 1 capsule (50 mg total) by mouth 3 (three) times daily as needed for anxiety. 11/25/18  Yes Eileen Stanford, PA-C  metroNIDAZOLE (FLAGYL) 500 MG tablet Take 500 mg by mouth every 8 (eight) hours. Start date:06/25/20 06/25/20  Yes [provider]  Omega-3 Fatty Acids (FISH OIL) 1000 MG CAPS Take 1,000 mg by mouth daily.   Yes [provider]  OVER THE COUNTER MEDICATION Apply 1 application topically daily. Cortisone 10 to scalp   Yes [provider]  simvastatin (ZOCOR) 20 MG tablet Take 20 mg by mouth daily.   Yes [provider]  sucralfate (CARAFATE) 1 GM/10ML suspension Take 10 mLs (1 g total) by mouth 4 (four) times daily -  with meals and at bedtime. 06/20/20  Yes Geradine Girt, DO  tetracycline (SUMYCIN) 500 MG capsule Take 500 mg by mouth every 6 (six) hours. Start date : 06/25/20 06/26/20  Yes [provider]  vitamin B-12 (CYANOCOBALAMIN) 500 MCG tablet Take 500 mcg by mouth daily.   Yes [provider]  vitamin E 180 MG (400 UNITS) capsule Take 400 Units by mouth daily.   Yes [provider]  amoxicillin (AMOXIL) 500 MG tablet Take 4 capsules (2,000 mg) one hour prior to all dental visits. Patient not taking: Reported on 07/04/2020 12/29/18   Eileen Stanford, PA-C  pantoprazole (PROTONIX) 40 MG tablet 40 mg BID x 4 weeks then daily Patient not taking: No sig reported 06/20/20   Geradine Girt, DO    Inpatient Medications: Scheduled Meds: . amLODipine  10 mg Oral Daily  . citalopram  20 mg Oral Daily  . furosemide  20 mg Intravenous Once  . pantoprazole  40 mg Oral BID  . [START ON 08/02/2020] pantoprazole  40 mg Oral Q1200  . simvastatin  20 mg Oral Daily  . sucralfate  1 g Oral TID WC & HS   Continuous Infusions:  PRN Meds: acetaminophen  **OR** acetaminophen, albuterol, bismuth subsalicylate, hydrOXYzine  Allergies:   No Known Allergies  Social History:   Social History   Socioeconomic History  . Marital status: Legally Separated    Spouse name: Not on file  . Number of children: Not on file  . Years of education: Not on file  . Highest education level: Not on file  Occupational History  . Occupation: Retired-sales rep  Tobacco Use  . Smoking status: Current Every Day Smoker    Packs/day: 0.50    Years: 55.00    Pack years: 27.50    Types: Cigarettes  . Smokeless tobacco: Never Used  . Tobacco comment: 3 cig/day  Vaping Use  . Vaping Use: Never  used  Substance and Sexual Activity  . Alcohol use: No  . Drug use: Not Currently  . Sexual activity: Not on file  Other Topics Concern  . Not on file  Social History Narrative  . Not on file   Social Determinants of Health   Financial Resource Strain: Not on file  Food Insecurity: Not on file  Transportation Needs: Not on file  Physical Activity: Not on file  Stress: Not on file  Social Connections: Not on file  Intimate Partner Violence: Not on file     Family History  Problem Relation Age of Onset  . Lung cancer Brother   . Heart disease Brother   . Heart failure Mother   . Parkinson's disease Father      ROS:   Constitutional: fatigue, weak Eyes: Denied vision change or loss Ears/Nose/Mouth/Throat: Denied ear ache, sore throat, sinus pain Cardiovascular: see HPI  Respiratory: see HPI  Gastrointestinal: see HPI  Genital/Urinary: Denied dysuria, hematuria, urinary frequency/urgency Musculoskeletal: Denied muscle ache, joint pain, weakness Skin: Denied rash, wound Neuro: reports postional dizziness, denied syncope Psych: history of depression/anxiety  Endocrine: Denied history of diabetes  Physical Exam/Data:   Vitals:   07/05/20 2112 07/05/20 2306 07/06/20 0608 07/06/20 1401  BP: 137/68 (!) 131/59 (!) 167/72 (!) 135/50  Pulse: 87 89 97  81  Resp: 18 18 18 17   Temp: 98.4 F (36.9 C) 98.2 F (36.8 C) 98.4 F (36.9 C) 98.1 F (36.7 C)  TempSrc: Oral Oral Oral Oral  SpO2: 93% 93% 90% 92%  Weight:      Height:        Intake/Output Summary (Last 24 hours) at 07/06/2020 1654 Last data filed at 07/05/2020 2100 Gross per 24 hour  Intake 220 ml  Output --  Net 220 ml   Last 3 Weights 07/05/2020 06/19/2020 12/08/2019  Weight (lbs) 108 lb 7.5 oz 105 lb 104 lb 9.6 oz  Weight (kg) 49.2 kg 47.628 kg 47.446 kg     Body mass index is 18.62 kg/m.   Vitals:  Vitals:   07/06/20 0608 07/06/20 1401  BP: (!) 167/72 (!) 135/50  Pulse: 97 81  Resp: 18 17  Temp: 98.4 F (36.9 C) 98.1 F (36.7 C)  SpO2: 90% 92%   General Appearance: In no apparent distress, sitting in bed HEENT: Normocephalic, atraumatic. EOMs intact. Sclera anicteric.  Neck: Supple, trachea midline, + JVDs Cardiovascular: Irregularly irregular, no murmur/rub/gallop, S3/S4 Respiratory: Resting breathing unlabored, lungs sounds with fine crackles and scattered wheezing to auscultation bilaterally, no use of accessory muscles. On room air.  Gastrointestinal: Bowel sounds positive, abdomen soft, non-tender, non-distended.   Extremities: Able to move all extremities in bed without difficulty, no edema/cyanosis/clubbing Genitourinary: No CVA tenderness / genital exam not performed Musculoskeletal: Normal muscle bulk and tone, muscle strength 5/5 throughout, no limited range of motion, no swollen or erythematous joints Skin: Intact, warm, dry. No rashes or petechiae noted in exposed areas.  Neurologic: Alert, oriented to person, place and time. Fluent speech, no facial droop, no cognitive deficit, no pronator drift, no gross sensory deficit, no focal muscle weakness, no gross focal neuro deficit Psychiatric: Normal affect. Mood is appropriate.    EKG:  The EKG was personally reviewed and demonstrates:  Paced rhythm.  Telemetry:  Telemetry was personally reviewed and  demonstrates:  AV paced rhythm  Relevant CV Studies:  Right and left heart cath 11/24/18:   Prox RCA to Mid RCA lesion is 10% stenosed.  Mid LAD lesion  is 20% stenosed.   1. Mild non-obstructive CAD 2. Severe aortic stenosis (mean gradient 38.7 mmHg, peak to peak gradient 47 mmHg, AVA 0.48 cm2)  Recommendations: Will continue workup for TAVR.   Echo 12/08/2019:  Left ventricular ejection fraction, by estimation, is 55 to 60%. The left ventricle has normal function. The left ventricle has no regional wall motion abnormalities. Left ventricular diastolic parameters are consistent with Grade I diastolic dysfunction (impaired relaxation). 2. Peak RV-RA gradient 29 mmHg. Right ventricular systolic function is normal. The right ventricular size is normal. 3. Bioprosthetic aortic valve s/p TAVR, 23 mm Edwards Sapien valve. Mean gradient 10 mmHg. No significant bioprosthetic valve stenosis or regurgitation. 4. The mitral valve is normal in structure. Trivial mitral valve regurgitation. No evidence of mitral stenosis. 5. The IVC was not visualized. 6. Technically difficult study with poor acoustic windows.   Echo from 07/06/2020:  1. Left ventricular ejection fraction, by estimation, is 50 to 55%. The left ventricle has low normal function. The left ventricle demonstrates regional wall motion abnormalities (see scoring diagram/findings for description). Apical hypokinesis. There is mild left ventricular hypertrophy. Left ventricular diastolic parameters are indeterminate. 2. Right ventricular systolic function is normal. The right ventricular size is mildly enlarged. There is moderately elevated pulmonary artery systolic pressure. The estimated right ventricular systolic pressure is 63.8 mmHg. 3. The mitral valve is abnormal. Trivial mitral valve regurgitation. Severe mitral annular calcification. 4. The aortic valve has been repaired/replaced. There is a 23 mm Edwards Sapien  prosthetic, stented (TAVR) valve present in the aortic position. Aortic valve regurgitation is mild to moderate. Mean gradient 8 mmHg, stable from prior echo 12/08/19 5. The inferior vena cava is dilated in size with <50% respiratory variability, suggesting right atrial pressure of 15 mmHg.   Laboratory Data:  High Sensitivity Troponin:   Recent Labs  Lab 07/04/20 1319 07/04/20 1641  TROPONINIHS 33* 36*     Chemistry Recent Labs  Lab 07/04/20 1319  NA 137  K 3.5  CL 105  CO2 27  GLUCOSE 118*  BUN 18  CREATININE 0.73  CALCIUM 9.0  GFRNONAA >60  ANIONGAP 5    No results for input(s): PROT, ALBUMIN, AST, ALT, ALKPHOS, BILITOT in the last 168 hours. Hematology Recent Labs  Lab 07/04/20 1319 07/05/20 1111 07/06/20 0308  WBC 6.1 4.6 5.0  RBC 3.86* 3.75* 3.62*  HGB 11.9* 11.4* 10.9*  HCT 36.6 35.4* 33.6*  MCV 94.8 94.4 92.8  MCH 30.8 30.4 30.1  MCHC 32.5 32.2 32.4  RDW 15.7* 15.9* 15.5  PLT 224 198 177   BNPNo results for input(s): BNP, PROBNP in the last 168 hours.  DDimer  Recent Labs  Lab 07/04/20 1641  DDIMER 1.74*     Radiology/Studies:  DG Chest 2 View  Result Date: 07/04/2020 CLINICAL DATA:  Shortness of breath and chest pain. EXAM: CHEST - 2 VIEW COMPARISON:  06/19/2020 FINDINGS: Left chest wall pacer device is noted with lead in the right atrial appendage and right ventricle. Normal heart size. Aortic atherosclerosis. No pleural effusion. No airspace consolidation, atelectasis or pneumothorax. IMPRESSION: 1. No acute cardiopulmonary abnormalities. 2.  Aortic Atherosclerosis (ICD10-I70.0). Electronically Signed   By: Kerby Moors M.D.   On: 07/04/2020 13:57   CT Angio Chest PE W and/or Wo Contrast  Result Date: 07/04/2020 CLINICAL DATA:  Shortness of breath.  Pulmonary embolus suspected. EXAM: CT ANGIOGRAPHY CHEST WITH CONTRAST TECHNIQUE: Multidetector CT imaging of the chest was performed using the standard protocol during bolus administration of  intravenous contrast. Multiplanar CT image reconstructions and MIPs were obtained to evaluate the vascular anatomy. CONTRAST:  74mL OMNIPAQUE IOHEXOL 350 MG/ML SOLN COMPARISON:  Chest x-ray 07/04/2020, CT angio chest 11/18/2018 FINDINGS: Cardiovascular: Left chest wall cardiac pacemaker with 2 leads in grossly appropriate position. Satisfactory opacification of the pulmonary arteries to the segmental level. No evidence of pulmonary embolism. Normal heart size. No significant pericardial effusion. The thoracic aorta is normal in caliber. Moderate to severe atherosclerotic plaque of the thoracic aorta. Aortic valve replacement. At least 3 vessel moderate coronary artery calcifications. Mediastinum/Nodes: No enlarged mediastinal, hilar, or axillary lymph nodes. Thyroid gland, trachea, and esophagus demonstrate no significant findings. Lungs/Pleura: Bilateral lower lobe subsegmental atelectasis. No focal consolidation. No pulmonary nodule or mass. Trace bilateral pleural effusions. No pneumothorax. Upper Abdomen: No acute abnormality. Musculoskeletal: No chest wall abnormality. No suspicious lytic or blastic osseous lesions. No acute displaced fracture. Multilevel mild degenerative changes of the spine. Review of the MIP images confirms the above findings. IMPRESSION: 1. No pulmonary embolus. 2. No acute intrathoracic abnormality other than trace bilateral pleural effusions. Electronically Signed   By: Iven Finn M.D.   On: 07/04/2020 18:35   ECHOCARDIOGRAM COMPLETE  Result Date: 07/06/2020    ECHOCARDIOGRAM REPORT   Patient Name:   DALEAH COULSON Date of Exam: 07/06/2020 Medical Rec #:  [id number]      Height:       64.0 in Accession #:    [id number]     Weight:       108.5 lb Date of Birth:  [birth year]       BSA:          1.508 m Patient Age:    74 years       BP:           167/72 mmHg Patient Gender: F              HR:           97 bpm. Exam Location:  Inpatient Procedure: 2D Echo, Cardiac Doppler and Color  Doppler Indications:    SOB  History:        Patient has prior history of Echocardiogram examinations, most                 recent 12/08/2019. Pacemaker; Risk Factors:Current Smoker,                 Dyslipidemia and Hypertension. 23 mm Edwards Sapien                 bioprosthetic, stented (TAVR) valve present. Procedure Date:                 12/21/18.  Sonographer:    Clayton Lefort RDCS (AE) Referring Phys: [id number] [facility]  Sonographer Comments: Technically difficult study due to poor echo windows and Technically challenging study due to limited acoustic windows. Attempts at apical images were made from subcostal window. IMPRESSIONS  1. Left ventricular ejection fraction, by estimation, is 50 to 55%. The left ventricle has low normal function. The left ventricle demonstrates regional wall motion abnormalities (see scoring diagram/findings for description). Apical hypokinesis. There is mild left ventricular hypertrophy. Left ventricular diastolic parameters are indeterminate.  2. Right ventricular systolic function is normal. The right ventricular size is mildly enlarged. There is moderately elevated pulmonary artery systolic pressure. The estimated right ventricular systolic pressure is 30.8 mmHg.  3. The mitral valve is abnormal. Trivial mitral valve regurgitation. Severe mitral annular calcification.  4. The aortic valve has been repaired/replaced. There is a 23 mm Edwards Sapien prosthetic, stented (TAVR) valve present in the aortic position. Aortic valve regurgitation is mild to moderate. Mean gradient 8 mmHg, stable from prior echo 12/08/19  5. The inferior vena cava is dilated in size with <50% respiratory variability, suggesting right atrial pressure of 15 mmHg. FINDINGS  Left Ventricle: Left ventricular ejection fraction, by estimation, is 50 to 55%. The left ventricle has low normal function. The left ventricle demonstrates regional wall motion abnormalities. The left ventricular internal cavity size was  normal in size. There is mild left ventricular hypertrophy. Left ventricular diastolic parameters are indeterminate.  LV Wall Scoring: The apical septal segment, apical anterior segment, and apex are akinetic. The mid anterior segment is hypokinetic. The entire lateral wall, anterior septum, entire inferior wall, mid inferoseptal segment, basal anterior segment, and basal inferoseptal segment are normal. Right Ventricle: The right ventricular size is mildly enlarged. No increase in right ventricular wall thickness. Right ventricular systolic function is normal. There is moderately elevated pulmonary artery systolic pressure. The tricuspid regurgitant velocity is 3.19 m/s, and with an assumed right atrial pressure of 15 mmHg, the estimated right ventricular systolic pressure is 08.1 mmHg. Left Atrium: Left atrial size was not well visualized. Right Atrium: Right atrial size was normal in size. Pericardium: There is no evidence of pericardial effusion. Mitral Valve: The mitral valve is abnormal. Severe mitral annular calcification. Trivial mitral valve regurgitation. Tricuspid Valve: The tricuspid valve is normal in structure. Tricuspid valve regurgitation is trivial. Aortic Valve: The aortic valve has been repaired/replaced. Aortic valve regurgitation is mild to moderate. Aortic regurgitation PHT measures 479 msec. Aortic valve mean gradient measures 7.3 mmHg. Aortic valve peak gradient measures 15.7 mmHg. Aortic valve area, by VTI measures 2.07 cm. There is a 23 mm Edwards Sapien prosthetic, stented (TAVR) valve present in the aortic position. Pulmonic Valve: The pulmonic valve was not well visualized. Pulmonic valve regurgitation is not visualized. Aorta: The aortic root is normal in size and structure. Venous: The inferior vena cava is dilated in size with less than 50% respiratory variability, suggesting right atrial pressure of 15 mmHg. IAS/Shunts: The interatrial septum was not well visualized.  LEFT VENTRICLE  PLAX 2D LVIDd:         4.30 cm LVIDs:         2.70 cm LV PW:         1.10 cm LV IVS:        0.80 cm LVOT diam:     2.30 cm LV SV:         74 LV SV Index:   49 LVOT Area:     4.15 cm  RIGHT VENTRICLE             IVC RV S prime:     18.10 cm/s  IVC diam: 2.30 cm TAPSE (M-mode): 2.3 cm LEFT ATRIUM           Index       RIGHT ATRIUM          Index LA diam:      2.60 cm 1.72 cm/m  RA Area:     5.20 cm LA Vol (A4C): 41.4 ml 27.45 ml/m RA Volume:   8.14 ml  5.40 ml/m  AORTIC VALVE AV Area (Vmax):    2.14 cm AV Area (Vmean):   2.08 cm AV Area (VTI):     2.07 cm AV Vmax:  198.33 cm/s AV Vmean:          124.667 cm/s AV VTI:            0.356 m AV Peak Grad:      15.7 mmHg AV Mean Grad:      7.3 mmHg LVOT Vmax:         102.00 cm/s LVOT Vmean:        62.500 cm/s LVOT VTI:          0.177 m LVOT/AV VTI ratio: 0.50 AI PHT:            479 msec  AORTA Ao Root diam: 3.40 cm MITRAL VALVE                TRICUSPID VALVE MV Area (PHT): 5.84 cm     TR Peak grad:   40.7 mmHg MV Decel Time: 130 msec     TR Vmax:        319.00 cm/s MV E velocity: 149.00 cm/s                             SHUNTS                             Systemic VTI:  0.18 m                             Systemic Diam: 2.30 cm Oswaldo Milian MD Electronically signed by Oswaldo Milian MD Signature Date/Time: 07/06/2020/10:33:32 AM    Final    VAS [place] LOWER EXTREMITY VENOUS (DVT)  Result Date: 07/05/2020  Lower Venous DVT Study Indications: Elevated d-dimer, SOB.  Comparison Study: No prior studies. Performing Technologist: Darlin Coco RDMS,RVT  Examination Guidelines: A complete evaluation includes B-mode imaging, spectral Doppler, color Doppler, and power Doppler as needed of all accessible portions of each vessel. Bilateral testing is considered an integral part of a complete examination. Limited examinations for reoccurring indications may be performed as noted. The reflux portion of the exam is performed with the patient in reverse  Trendelenburg.  +---------+---------------+---------+-----------+----------+--------------+ RIGHT    CompressibilityPhasicitySpontaneityPropertiesThrombus Aging +---------+---------------+---------+-----------+----------+--------------+ CFV      Full           Yes      Yes                                 +---------+---------------+---------+-----------+----------+--------------+ SFJ      Full                                                        +---------+---------------+---------+-----------+----------+--------------+ FV Prox  Full                                                        +---------+---------------+---------+-----------+----------+--------------+ FV Mid   Full                                                        +---------+---------------+---------+-----------+----------+--------------+  FV DistalFull                                                        +---------+---------------+---------+-----------+----------+--------------+ PFV      Full                                                        +---------+---------------+---------+-----------+----------+--------------+ POP      Full           Yes      Yes                                 +---------+---------------+---------+-----------+----------+--------------+ PTV      Full                                                        +---------+---------------+---------+-----------+----------+--------------+ PERO     Full                                                        +---------+---------------+---------+-----------+----------+--------------+   +---------+---------------+---------+-----------+----------+--------------+ LEFT     CompressibilityPhasicitySpontaneityPropertiesThrombus Aging +---------+---------------+---------+-----------+----------+--------------+ CFV      Full           Yes      Yes                                  +---------+---------------+---------+-----------+----------+--------------+ SFJ      Full                                                        +---------+---------------+---------+-----------+----------+--------------+ FV Prox  Full                                                        +---------+---------------+---------+-----------+----------+--------------+ FV Mid   Full                                                        +---------+---------------+---------+-----------+----------+--------------+ FV DistalFull                                                        +---------+---------------+---------+-----------+----------+--------------+  PFV      Full                                                        +---------+---------------+---------+-----------+----------+--------------+ POP      Full           Yes      Yes                                 +---------+---------------+---------+-----------+----------+--------------+ PTV      Full                                                        +---------+---------------+---------+-----------+----------+--------------+ PERO     Full                                                        +---------+---------------+---------+-----------+----------+--------------+     Summary: RIGHT: - There is no evidence of deep vein thrombosis in the lower extremity.  - No cystic structure found in the popliteal fossa.  LEFT: - There is no evidence of deep vein thrombosis in the lower extremity.  - No cystic structure found in the popliteal fossa.  *See table(s) above for measurements and observations. Electronically signed by Servando Snare MD on 07/05/2020 at 3:17:54 PM.    Final      Assessment and Plan:    Acute on chronic diastolic heart failure Hx of severe aortic stenosis s/p TAVR Hx of high grade AV block s/p PPM  Non-obstructive CAD -Patient presented with SOB, fatigue, weakness, and black tarry stool  intermittently since end of March 2022. She denied any chest pain, chest pressure, syncope. Hs Trop plateaued 33 >36.  BNP not sent this admission. Cr 0.73 on 07/04/20. . EKG paced rhythm. CTA without PE, + bilateral lower lobe subsegmental atelectasis. Trace bilateral pleural effusions. LHC 11/24/2018 with mild non-obstructive CAD. Echo repeated 07/06/20 showed LVEF 50-55%, apical hypokinesis, LV diastolic parameters are indeterminate, RV systolic function normal, moderately elevated pulmonary artery systolic pressure. The estimated RV systolic pressure is 75.6 mmHg. Trivial MR, severe mitral annular calcification, 23 mm Edwards Sapien prosthetic stented (TAVR) valve present in the aortic position. Aortic valve regurgitation is mild to moderate. Mean gradient 8 mmHg, stable from prior echo 12/08/19.  - clinical exam today is consistent with mild volume overload, suspect due to recent volume resuscitation from hospitlaization for GI bleed. - apical hypokinesis on Echo today likely a reflection of RV pacing, not ischemic process; moderately elevated pulmonary artery systolic pressure lis consistent with volume overload; no acute issue noted for prosthetic valve  - recommend IV lasix 20mg  x1 now, and ok to discharge home (if stable from GI bleed standpoint) on PO lasix 20mg  daily for 4 days.  - would continue ASA 81mg  if no absolute contraindication  - continue statin  - she will need to follow up with cardiology post discharge for re-evaluation of volume status to  determine if chronic diuresis is needed, appointment is arranged for [address] office at 10:45AM on 07/12/20, this is communicated to hospitlaist team   Melena, recurrent   H pylori infection  Normocytotic Anemia  - EGD 06/19/2020 which identified a 3 cm hiatal hernia, gastritis and a nonbleeding duodenal ulcer. - Hgb stable 11.9 >11.4 >10.9, seem improved from last hospitalization - GI consulted, recommend hold off repeat EGD and continue PPI and H  pylori treatment  - care per GI and primary team   Hypertension - BP controlled, continue amlodipine  Carotid artery disease  - continue surveillance carotid doppler at routine follow up with cardiology   Tobacco abuse - discussed smoking cessation  - mild wheezing on exam, consider outpatient PFT    Risk Assessment/Risk Scores:     New York Heart Association (NYHA) Functional Class NYHA Class II    For questions or updates, please contact [facility] Please consult [email or web address] for contact info under    Signed, Margie Billet, NP  07/06/2020 4:54 PM  Patient seen and examined and agree with Margie Billet, NP as detailed above.  In brief, the patient is a 85 y.o. female with a PMH of hypertension, hyperlipidemia, anxiety, depression, mild nonobstructive CAD, high grade AV block s/p PPM 10/20/2018, severe aortic stenosis s/p TAVR 12/21/2018, cartoid artery disease, hx of endometrial adenocarcinoma in 2011, tobacco abuse, non-obstructive CAD on cath in 2020 and recent admission with melena in the setting of H.Pylori infection who returned with recurrent melena. Cardiology is consulted regarding apical hypokinesis on TTE.  TTE thoroughly reviewed and apical WMA likely related to RV pacing with low suspicion for ischemic etiology. Cath in 2020 with minimal CAD and no ischemic symptoms. Prosthestic TAVR valve with normal gradients, mild-to-moderate AR. PASP also elevated with RAP >78mmHg suggestive of mild volume overload. Suspect she is mildly volume up after recent IVF administration for GIB in the setting of known diastolic dysfunction.   Today, she states she feels weak and fatigued which has been ongoing with the melena. Mild SOB. No chest pain or LE edema.   GEN: No acute distress.   Neck: JVD 10cm Cardiac: RRR, 2/6 systolic murmur  Respiratory: Crackles at the bases bilaterally GI: Soft, nontender, non-distended  MS: No edema; No deformity. Neuro:  Nonfocal  Psych: Normal  affect  Plan: -Apical hypokinesis noted on TTE likely related to RV pacing with low suspicion for ischemic etiology; cath in 2020 with minimal disease -Give lasix 20mg  IV now for mild volume overload -Will continue lasix 20mg  PO x4 days following discharge as suspect she is mildly overloaded in the setting of IVF administration for GIB -Will need close cardiology follow-up for repeat BMET and CBC -Monitor weights at home  -Management of melena and h.pylori per GI -Okay to hold aspirin if needed while awaiting resolution of melena -Continue statin  Gwyndolyn Kaufman, MD

## 2020-07-09 NOTE — Telephone Encounter (Signed)
Patient contacted regarding discharge from [facility] on 07/06/20  Patient understands to follow up with provider --Melina Copa, PA on 4/14//22 at 10:45 at [facility] office Patient understands discharge instructions? yes Patient understands medications and regiment? yes Patient understands to bring all medications to this visit? yes  Ask patient:  Are you enrolled in My Chart -No.  She does not want to enroll at this time but will think about it.  Patient has all her medications at current time.  She is asking about cheaper alternative to Pantoprazole.  I asked her to contact Dr Celesta Aver office regarding this medication and to schedule follow up.

## 2020-07-10 NOTE — Progress Notes (Addendum)
Cardiology Office Note    Date:  07/12/2020   ID:  [name], DOB [birth year], MRN [id number]  PCP:  Leonard Downing, MD  Cardiologist:  Lauree Chandler, MD  Electrophysiologist:  Virl Axe, MD   Chief Complaint: follow-up shortness of breath  History of Present Illness:   [name] is a 85 y.o. female with history of HTN, HLD, mild nonobstructive CAD, severe AS s/p TAVR 11/2018 (mild-moderate AI by echo 06/2020), moderate pulmonary HTN by echo 06/2020, high grade AV block s/p PPM 09/2018, suspected chronic diastolic CHF, anxiety, depression, carotid artery disease (2-35% RICA, 57-32% LICA 05/252), Plavix allergy, endometrial adenocarcinoma 2011, longstanding tobacco abuse, recent GI bleed who presents for follow-up shortness of breath.  To recap history, she was admitted in 2020 for near-syncope due to 2:1 AVB with intermittent CHB and also was found to have severe AS. She underwent [employer] PPM implant. Cath 10/2018 showed nonobstructive CAD and she went onto have TAVR 11/2018. Post-hospital course was notable for Plavix allergy, so she has been continued on aspirin indefinitely. She was recently hospitalized 05/2020 for ABL anemia (Hgb 9-10 range) with melena 2/2 UGIB. EGD showed hiatal hernia, gastritis, and nonbleeding duodenal ulcer with + H Pylori. She was treated for this and cleared to resume her ASA. She was readmitted 4/6-07/06/20 with SOB and black stools. Her Hgb was stable so was not felt to require further procedures. GI recommended to hold antibiotics for H. Pylori with plan to revisit different regimen as outpatient. She was continued on PPI. D-dimer was elevated but CTA was negative for PE and LE venous duplex negative for DVT. Cardiology was consulted for possible contribution of CHF and mildly elevated troponin. 2D Echo 07/06/20 EF 50-55%, apical hypokinesis, mild LVH, mildly enlarged RV, moderately elevated PASP, dilated IVC, mild-moderate AI. Her apical hypokinesis  was felt to be a reflection of RV pacing, not ischemic process. Troponins were low and flat 33-36 not felt to reflect ACS. She was treated with a dose of IV Lasix and recommended for PO Lasix x 4 days for possible component of fluid overload in the setting of IV fluid administration for GI bleed.  She is seen back for follow-up today overall feeling better. She still hasn't gotten all of her strength back. She still gets somewhat SOB with higher levels of exertion but nothing compared to what brought her into the hospital recently. She feels this is improving slowly. No longer having dark stools but they are mucousy at times. She has not yet been back to see GI or primary care. She reports she had good UOP with the Lasix, now finished with course. No edema, orthopnea or angina. She continues to smoke but has cut down to 3 cigarettes a day. She has a chronic congestion-type cough but has never been diagnosed with COPD.  Labwork independently reviewed: 06/2020 Hgb 10.9, TSH wnl, hsTroponin 33-36, Cr 0.73 05/2020 albumin 3.4   Past Medical History:  Diagnosis Date  . Anxiety    Stable  . ASCUS on Pap smear 11/15/09  . Carotid artery disease (HCC)    1-39% RICA stenosis and 27-06% LICA stenosis on pre TAVR dopplers   . Depression   . Duodenal ulcer due to Helicobacter pylori 2/37/6283  . Endometrial polyp 11/15/2009  . Endometrioid adenocarcinoma   . H/O varicella   . Hemorrhoids   . History of measles, mumps, or rubella   . Hypercholesterolemia   . Hypertension   . Menopausal symptoms 06/13/10  .  Ovarian cyst 11/15/09  . PMB (postmenopausal bleeding) 11/15/09  . Presence of permanent cardiac pacemaker 10/20/2018  . Vaginal bleeding     Past Surgical History:  Procedure Laterality Date  . BIOPSY  06/19/2020   Procedure: BIOPSY;  Surgeon: Rush Landmark Telford Nab., MD;  Location: [place];  Service: Gastroenterology;;  . BREAST SURGERY     Over twenty - five  years ago  . CATARACT  EXTRACTION W/ INTRAOCULAR LENS IMPLANT Bilateral   . CERVICAL BIOPSY    . ESOPHAGOGASTRODUODENOSCOPY (EGD) WITH PROPOFOL N/A 06/19/2020   Procedure: ESOPHAGOGASTRODUODENOSCOPY (EGD) WITH PROPOFOL;  Surgeon: Rush Landmark Telford Nab., MD;  Location: [place];  Service: Gastroenterology;  Laterality: N/A;  . EYE SURGERY    . INSERT / REPLACE / REMOVE PACEMAKER  10/20/2018  . LAPAROSCOPIC ASSISTED VAGINAL HYSTERECTOMY  12/2009   BSO  . MULTIPLE EXTRACTIONS WITH ALVEOLOPLASTY N/A 12/09/2018   Procedure: EXTRACTION OF TOOTH #'S 6,21-29 AND 32 WITH ALVEOLOPLASTY AND BILATERAL MANDIBULAR TORI REDUCTIONS;  Surgeon: Lenn Cal, DDS;  Location: [facility];  Service: Oral Surgery;  Laterality: N/A;  . PACEMAKER IMPLANT N/A 10/20/2018   Procedure: PACEMAKER IMPLANT;  Surgeon: Deboraha Sprang, MD;  Location: [facility];  Service: Cardiovascular;  Laterality: N/A;  . RIGHT/LEFT HEART CATH AND CORONARY ANGIOGRAPHY N/A 11/24/2018   Procedure: RIGHT/LEFT HEART CATH AND CORONARY ANGIOGRAPHY;  Surgeon: Burnell Blanks, MD;  Location: [facility];  Service: Cardiovascular;  Laterality: N/A;  . TEE WITHOUT CARDIOVERSION N/A 12/21/2018   Procedure: TRANSESOPHAGEAL ECHOCARDIOGRAM (TEE);  Surgeon: Burnell Blanks, MD;  Location: [facility];  Service: Open Heart Surgery;  Laterality: N/A;    Current Medications: Current Meds  Medication Sig  . acetaminophen (TYLENOL) 500 MG tablet Take 500 mg by mouth at bedtime.  Marland Kitchen albuterol (VENTOLIN HFA) 108 (90 Base) MCG/ACT inhaler Inhale 2 puffs into the lungs every 4 (four) hours as needed for wheezing or shortness of breath.  Marland Kitchen amLODipine (NORVASC) 10 MG tablet Take 10 mg by mouth daily.  Marland Kitchen aspirin 81 MG chewable tablet Chew 1 tablet (81 mg total) by mouth daily.  . carboxymethylcellulose (REFRESH PLUS) 0.5 % SOLN Place 1 drop into both eyes daily as needed (dry eyes).   . Cholecalciferol (VITAMIN D-3) 125 MCG (5000 UT) TABS Take 5,000 Units by  mouth daily.  . citalopram (CELEXA) 40 MG tablet Take 20 mg by mouth daily.  . hydrOXYzine (VISTARIL) 50 MG capsule Take 1 capsule (50 mg total) by mouth 3 (three) times daily as needed for anxiety.  . Omega-3 Fatty Acids (FISH OIL) 1000 MG CAPS Take 1,000 mg by mouth daily.  . pantoprazole (PROTONIX) 40 MG tablet Take 1 tablet (40 mg total) by mouth 2 (two) times daily.  . simvastatin (ZOCOR) 20 MG tablet Take 20 mg by mouth daily.  . sucralfate (CARAFATE) 1 GM/10ML suspension Take 10 mLs (1 g total) by mouth 4 (four) times daily -  with meals and at bedtime.  . vitamin B-12 (CYANOCOBALAMIN) 500 MCG tablet Take 500 mcg by mouth daily.  . vitamin E 180 MG (400 UNITS) capsule Take 400 Units by mouth daily.     Allergies:   Patient has no known allergies.   Social History   Socioeconomic History  . Marital status: Legally Separated    Spouse name: Not on file  . Number of children: Not on file  . Years of education: Not on file  . Highest education level: Not on file  Occupational  History  . Occupation: Retired-sales rep  Tobacco Use  . Smoking status: Current Every Day Smoker    Packs/day: 0.50    Years: 55.00    Pack years: 27.50    Types: Cigarettes  . Smokeless tobacco: Never Used  . Tobacco comment: 3 cig/day  Vaping Use  . Vaping Use: Never used  Substance and Sexual Activity  . Alcohol use: No  . Drug use: Not Currently  . Sexual activity: Not on file  Other Topics Concern  . Not on file  Social History Narrative  . Not on file   Social Determinants of Health   Financial Resource Strain: Not on file  Food Insecurity: Not on file  Transportation Needs: Not on file  Physical Activity: Not on file  Stress: Not on file  Social Connections: Not on file     Family History:  The patient's family history includes Heart disease in her brother; Heart failure in her mother; Lung cancer in her brother; Parkinson's disease in her father.  ROS:   Please see the history  of present illness.  All other systems are reviewed and otherwise negative.    EKGs/Labs/Other Studies Reviewed:    Studies reviewed are outlined and summarized above. Reports included below if pertinent.  2D echo 07/06/20 1. Left ventricular ejection fraction, by estimation, is 50 to 55%. The  left ventricle has low normal function. The left ventricle demonstrates  regional wall motion abnormalities (see scoring diagram/findings for  description). Apical hypokinesis. There  is mild left ventricular hypertrophy. Left ventricular diastolic  parameters are indeterminate.  2. Right ventricular systolic function is normal. The right ventricular  size is mildly enlarged. There is moderately elevated pulmonary artery  systolic pressure. The estimated right ventricular systolic pressure is  23.3 mmHg.  3. The mitral valve is abnormal. Trivial mitral valve regurgitation.  Severe mitral annular calcification.  4. The aortic valve has been repaired/replaced. There is a 23 mm Edwards  Sapien prosthetic, stented (TAVR) valve present in the aortic position.  Aortic valve regurgitation is mild to moderate. Mean gradient 8 mmHg,  stable from prior echo 12/08/19  5. The inferior vena cava is dilated in size with <50% respiratory  variability, suggesting right atrial pressure of 15 mmHg.     EKG:  EKG is not ordered today  Recent Labs: 06/18/2020: ALT 11 06/20/2020: Magnesium 1.8 07/04/2020: BUN 18; Creatinine, Ser 0.73; Potassium 3.5; Sodium 137 07/05/2020: TSH 2.249 07/06/2020: Hemoglobin 10.9; Platelets 177  Recent Lipid Panel No results found for: CHOL, TRIG, HDL, CHOLHDL, VLDL, LDLCALC, LDLDIRECT  PHYSICAL EXAM:    VS:  BP 128/70   Pulse 73   Ht 5\' 4"  (1.626 m)   Wt 104 lb 12.8 oz (47.5 kg)   SpO2 95%   BMI 17.99 kg/m   BMI: Body mass index is 17.99 kg/m.  GEN: Frail appearing thin female in no acute distress HEENT: normocephalic, atraumatic Neck: no JVD, carotid bruits, or  masses Cardiac: RRR; no murmurs, rubs, or gallops, no edema  Respiratory: coarse BS bilaterally with generally diminished BS but no crackles, wheezing or rhonchi, normal work of breathing GI: soft, nontender, nondistended, + BS MS: no deformity or atrophy Skin: warm and dry, no rash Neuro:  Alert and Oriented x 3, Strength and sensation are intact, follows commands Psych: euthymic mood, full affect  Wt Readings from Last 3 Encounters:  07/12/20 104 lb 12.8 oz (47.5 kg)  07/05/20 108 lb 7.5 oz (49.2 kg)  06/19/20 105 lb (  47.6 kg)     ASSESSMENT & PLAN:   1. Shortness of breath - suspect multifactorial. By exam and history I suspect she has some degree of COPD that was probably worsened by her underlying anemia. She also had a possible component of volume overload/diastolic CHF that has improved with short course of Lasix. She appears euvolemic so we will hold off on continuing at this time. Will recheck CBC and BMET today. I have also reached out to Dr. Angelena Form about her echocardiogram findings with particular attention to the aortic insufficiency. He reviewed the echocardiogram with me and does feel this is a new change from prior imaging. He plans to review her case with the TAVR team next week with their conference and will relay back any further needs. I do think she would benefit from interim f/u with primary care as well given possible underlying chronic lung disease and to further monitor her anemia after today. She will call them to schedule.  2. Moderate pulm HTN - noted by echo, may have been related to increased hemodynamic stresses, volume shifts, +/- component of underlying decreased pulmonary reserve. Will need to follow clinically for now and await input of echocardiogram discussion as above. Will arrange close f/u with Dr. Angelena Form.  3. Severe AS s/p TAVR 2020 with mild-moderate aortic insufficiency - pending review of echocardiogram with TAVR team as outlined above. Continue ASA  as we are able.  4. Mild CAD by cath 2020 - no recent angina reported. She has had some dyspnea but this seems to be improving per her report. If her dyspnea recurs/worsens, would need to consider updating ischemic evaluation.  5. H/o high grade AVB s/p PPM - overdue for EP f/u, last OV 2020. Will request this get scheduled.  6. Carotid artery disease - previously recommended for duplex 05/2020. We will help get this set up. She is on statin therapy.  Disposition: F/u with Dr. Angelena Form in 3 months, sooner if necessitated by echocardiogram discussion. The patient also plans to contact GI about ongoing mucousy stools to move up follow-up.  Medication Adjustments/Labs and Tests Ordered: Current medicines are reviewed at length with the patient today.  Concerns regarding medicines are outlined above. Medication changes, Labs and Tests ordered today are summarized above and listed in the Patient Instructions accessible in Encounters.   Signed, Charlie Pitter, PA-C  07/12/2020 11:42 AM    [address], [place], [place]  [address] Phone: [phone]; Fax: [phone]

## 2020-07-12 ENCOUNTER — Encounter: Payer: Self-pay | Admitting: Physician Assistant

## 2020-07-12 ENCOUNTER — Ambulatory Visit: Payer: Medicare HMO | Admitting: Physician Assistant

## 2020-07-12 ENCOUNTER — Other Ambulatory Visit: Payer: Self-pay

## 2020-07-12 VITALS — BP 128/70 | HR 73 | Ht 64.0 in | Wt 104.8 lb

## 2020-07-12 DIAGNOSIS — I272 Pulmonary hypertension, unspecified: Secondary | ICD-10-CM | POA: Diagnosis not present

## 2020-07-12 DIAGNOSIS — I5032 Chronic diastolic (congestive) heart failure: Secondary | ICD-10-CM

## 2020-07-12 DIAGNOSIS — R0602 Shortness of breath: Secondary | ICD-10-CM

## 2020-07-12 DIAGNOSIS — I351 Nonrheumatic aortic (valve) insufficiency: Secondary | ICD-10-CM | POA: Diagnosis not present

## 2020-07-12 DIAGNOSIS — I6523 Occlusion and stenosis of bilateral carotid arteries: Secondary | ICD-10-CM

## 2020-07-12 DIAGNOSIS — Z952 Presence of prosthetic heart valve: Secondary | ICD-10-CM

## 2020-07-12 DIAGNOSIS — Z950 Presence of cardiac pacemaker: Secondary | ICD-10-CM

## 2020-07-12 DIAGNOSIS — I2510 Atherosclerotic heart disease of native coronary artery without angina pectoris: Secondary | ICD-10-CM

## 2020-07-12 NOTE — Patient Instructions (Addendum)
Medication Instructions:  Your physician recommends that you continue on your current medications as directed. Please refer to the Current Medication list given to you today.  *If you need a refill on your cardiac medications before your next appointment, please call your pharmacy*   Lab Work: TODAY:  BMET & CBC  If you have labs (blood work) drawn today and your tests are completely normal, you will receive your results only by: Marland Kitchen MyChart Message (if you have MyChart) OR . A paper copy in the mail If you have any lab test that is abnormal or we need to change your treatment, we will call you to review the results.   Testing/Procedures: Your physician has requested that you have a carotid duplex. This test is an ultrasound of the carotid arteries in your neck. It looks at blood flow through these arteries that supply the brain with blood. Allow one hour for this exam. There are no restrictions or special instructions.    Follow-Up: At [facility], you and your health needs are our priority.  As part of our continuing mission to provide you with exceptional heart care, we have created designated Provider Care Teams.  These Care Teams include your primary Cardiologist (physician) and Advanced Practice Providers (APPs -  Physician Assistants and Nurse Practitioners) who all work together to provide you with the care you need, when you need it.  We recommend signing up for the patient portal called "MyChart".  Sign up information is provided on this After Visit Summary.  MyChart is used to connect with patients for Virtual Visits (Telemedicine).  Patients are able to view lab/test results, encounter notes, upcoming appointments, etc.  Non-urgent messages can be sent to your provider as well.   To learn more about what you can do with MyChart, go to [email or web address].    Your next appointment:   3 month(s)  The format for your next appointment:   In Person  Provider:   You may  see [name], MD  or one of the following Advanced Practice Providers on your designated Care Team:    Richardson Dopp, PA-C  Robbie Lis, [place]  Your physician recommends that you schedule a follow-up appointment in: Dr. Caryl Comes 1st available for overdue appointment    Other Instructions

## 2020-07-13 LAB — BASIC METABOLIC PANEL
BUN/Creatinine Ratio: 16 (ref 12–28)
BUN: 13 mg/dL (ref 8–27)
CO2: 24 mmol/L (ref 20–29)
Calcium: 9.2 mg/dL (ref 8.7–10.3)
Chloride: 101 mmol/L (ref 96–106)
Creatinine, Ser: 0.79 mg/dL (ref 0.57–1.00)
Glucose: 74 mg/dL (ref 65–99)
Potassium: 3.7 mmol/L (ref 3.5–5.2)
Sodium: 143 mmol/L (ref 134–144)
eGFR: 75 mL/min/{1.73_m2} (ref >59)

## 2020-07-13 LAB — CBC
Hematocrit: 38.9 % (ref 34.0–46.6)
Hemoglobin: 12.3 g/dL (ref 11.1–15.9)
MCH: 29 pg (ref 26.6–33.0)
MCHC: 31.6 g/dL (ref 31.5–35.7)
MCV: 92 fL (ref 79–97)
Platelets: 186 10*3/uL (ref 150–450)
RBC: 4.24 x10E6/uL (ref 3.77–5.28)
RDW: 13.6 % (ref 11.7–15.4)
WBC: 5.1 10*3/uL (ref 3.4–10.8)

## 2020-07-15 ENCOUNTER — Telehealth: Payer: Self-pay | Admitting: Physician Assistant

## 2020-07-15 DIAGNOSIS — Z952 Presence of prosthetic heart valve: Secondary | ICD-10-CM

## 2020-07-15 DIAGNOSIS — I351 Nonrheumatic aortic (valve) insufficiency: Secondary | ICD-10-CM

## 2020-07-15 NOTE — Telephone Encounter (Signed)
   Hi [name], This is the patient I spoke with you about last week with the abnormal echo following previous TAVR in 2020. She was consulted recently in the hospital for SOB in setting of recent GI bleeding. 2D Echo 07/06/20 EF 50-55%, apical hypokinesis, mild LVH, mildly enlarged RV, moderately elevated PASP, dilated IVC, mild-moderate AI. Dr. Johney Frame felt her apical hypokinesis was felt to be a reflection of RV pacing, not ischemic process. Troponins were low and flat 33-36 not felt to reflect ACS. She was treated with a dose of IV Lasix and recommended for PO Lasix x 4 days for possible component of fluid overload in the setting of IV fluid administration for GI bleed. I saw her back in clinic 07/12/20 and she was feeling somewhat better - still a little SOB with higher levels of activity but nowhere near what she felt when she had to present to the hospital. Her weight was down 4lb and appeared euvolemic. She is a longstanding smoker so I suspect that may be contributing to her overall reserve.   I told her at the [facility] you had planned to talk to the TAVR team about her echo and case this upcoming week - the apical changes, RV enlargement and pulmonary HTN were also new in addition to the aortic insufficiency. Please let me know if I need to do anything else from my standpoint once your team reviews - I tentatively recommended 3 month follow-up with you. Will also cc to Joellen Jersey so she is in the loop too. (I put this in a patient call so it's easier to find in Epic.)  Thanks, [name]

## 2020-07-23 ENCOUNTER — Telehealth: Payer: Self-pay | Admitting: Internal Medicine

## 2020-07-23 DIAGNOSIS — R0602 Shortness of breath: Secondary | ICD-10-CM

## 2020-07-23 MED ORDER — METOPROLOL TARTRATE 100 MG PO TABS: ORAL_TABLET | ORAL | 0 refills | Status: DC

## 2020-07-23 NOTE — Telephone Encounter (Signed)
Left detailed message on patient's niece's phone that we may plan to see her back sooner than later and that we can discuss next appointment once upcoming testing is complete.  She is having CTs ordered by TAVR team 07/30/20.

## 2020-07-23 NOTE — Telephone Encounter (Signed)
[name] is calling                   eiith concerns about her aunt.She states that her health is declining and wants to tslk to someone in regards to her case.She also states her aunt has a leaking heart vlve that she called in previously to discuss but has not heard anything back about this matter.Please advise

## 2020-07-23 NOTE — Telephone Encounter (Signed)
Burnell Blanks, MD  Barkley Boards, RN Hey, It looks like her WBC was normal. No fevers. This is not likely endocarditis. Would you mind setting her up for a cardiac CT to look at the valve for thrombosis? Thanks, Gerald Stabs    I spoke with the pt and made her aware of recommendation for Cardiac CT to evaluate the function of TAVR valve with new AI seen on Echo. Pt scheduled for Cardiac CT on Monday, May 2 at 10:45 AM, arrive in Admitting at [facility] at 10:15 AM and then proceed to Radiology.   On the Day/Night Before the Test: . Drink plenty of water. . Do not consume any caffeinated/decaffeinated beverages or chocolate 12 hours prior to your test. . Do not take any antihistamines 12 hours prior to your test.  On the Day of the Test: . Drink plenty of water. Do not drink any water within one hour of the test. . Do not eat any food 4 hours prior to the test, nothing after 6:45 AM.  . You may take your regular medications prior to the test with the following exception, do not use inhaler the morning of test.  . Please take Metoprolol Tartrate 100mg  one tablet by mouth around 9:00 AM the morning of test.   After the Test: . Drink plenty of water. . After receiving IV contrast, you may experience a mild flushed feeling. This is normal. . On occasion, you may experience a mild rash up to 24 hours after the test. This is not dangerous. If this occurs, you can take Benadryl 25 mg and increase your fluid intake. . If you experience trouble breathing, this can be serious. If it is severe call 911 IMMEDIATELY. If it is mild, please call our office.

## 2020-07-24 ENCOUNTER — Other Ambulatory Visit: Payer: Self-pay

## 2020-07-24 ENCOUNTER — Ambulatory Visit (HOSPITAL_COMMUNITY)
Admission: RE | Admit: 2020-07-24 | Discharge: 2020-07-24 | Disposition: A | Discharge status: Home / Self Care | Payer: Medicare HMO | Source: Ambulatory Visit | Attending: Cardiovascular Disease | Admitting: Cardiovascular Disease

## 2020-07-24 ENCOUNTER — Other Ambulatory Visit: Payer: Self-pay | Admitting: Physician Assistant

## 2020-07-24 ENCOUNTER — Ambulatory Visit (INDEPENDENT_AMBULATORY_CARE_PROVIDER_SITE_OTHER): Payer: Medicare HMO

## 2020-07-24 DIAGNOSIS — I2510 Atherosclerotic heart disease of native coronary artery without angina pectoris: Secondary | ICD-10-CM | POA: Diagnosis present

## 2020-07-24 DIAGNOSIS — R0602 Shortness of breath: Secondary | ICD-10-CM | POA: Diagnosis present

## 2020-07-24 DIAGNOSIS — I6522 Occlusion and stenosis of left carotid artery: Secondary | ICD-10-CM | POA: Diagnosis not present

## 2020-07-24 DIAGNOSIS — I272 Pulmonary hypertension, unspecified: Secondary | ICD-10-CM

## 2020-07-24 DIAGNOSIS — Z952 Presence of prosthetic heart valve: Secondary | ICD-10-CM

## 2020-07-24 DIAGNOSIS — Z950 Presence of cardiac pacemaker: Secondary | ICD-10-CM

## 2020-07-24 DIAGNOSIS — I442 Atrioventricular block, complete: Secondary | ICD-10-CM | POA: Diagnosis not present

## 2020-07-24 DIAGNOSIS — I6523 Occlusion and stenosis of bilateral carotid arteries: Secondary | ICD-10-CM

## 2020-07-24 DIAGNOSIS — I351 Nonrheumatic aortic (valve) insufficiency: Secondary | ICD-10-CM

## 2020-07-24 NOTE — Telephone Encounter (Signed)
Her next step is the CT scans.

## 2020-07-25 LAB — CUP PACEART REMOTE DEVICE CHECK
Battery Remaining Longevity: 121 mo
Battery Voltage: 3.01 V
Brady Statistic AP VP Percent: 0.36 %
Brady Statistic AP VS Percent: 0 %
Brady Statistic AS VP Percent: 99.54 %
Brady Statistic AS VS Percent: 0.1 %
Brady Statistic RA Percent Paced: 0.38 %
Brady Statistic RV Percent Paced: 99.9 %
Date Time Interrogation Session: 20220427001412
Implantable Lead Implant Date: 20200722
Implantable Lead Implant Date: 20200722
Implantable Lead Location: 753859
Implantable Lead Location: 753860
Implantable Lead Model: 1944
Implantable Lead Model: 1948
Implantable Pulse Generator Implant Date: 20200722
Lead Channel Impedance Value: 342 Ohm
Lead Channel Impedance Value: 399 Ohm
Lead Channel Impedance Value: 456 Ohm
Lead Channel Impedance Value: 589 Ohm
Lead Channel Pacing Threshold Amplitude: 0.5 V
Lead Channel Pacing Threshold Amplitude: 0.625 V
Lead Channel Pacing Threshold Pulse Width: 0.4 ms
Lead Channel Pacing Threshold Pulse Width: 0.4 ms
Lead Channel Sensing Intrinsic Amplitude: 2.75 mV
Lead Channel Sensing Intrinsic Amplitude: 2.75 mV
Lead Channel Sensing Intrinsic Amplitude: 9.75 mV
Lead Channel Sensing Intrinsic Amplitude: 9.75 mV
Lead Channel Setting Pacing Amplitude: 1.5 V
Lead Channel Setting Pacing Amplitude: 2.5 V
Lead Channel Setting Pacing Pulse Width: 0.4 ms
Lead Channel Setting Sensing Sensitivity: 2 mV

## 2020-07-26 ENCOUNTER — Telehealth: Payer: Self-pay | Admitting: Internal Medicine

## 2020-07-26 NOTE — Telephone Encounter (Signed)
Spoke with pt who requested to review her Cardiac CT instruction to ensure she understood everything previously reviewed by Ander Purpura.  Instructions reviewed and pt verbalized understanding and agrees with current plan.  Pt states she is still waiting for medication she is to take prior to her CT to be sent to her pharmacy.  Pt advised to check with pharmacy later today.Cardiac CT is scheduled for Monday 07/30/2020 at 1045am.  Pt states she will arrive at 1015 as previously instructed.

## 2020-07-26 NOTE — Telephone Encounter (Signed)
Patient called with concerns/question for her upcoming procedure that would be done on Monday. Please advise

## 2020-07-30 ENCOUNTER — Other Ambulatory Visit: Payer: Self-pay

## 2020-07-30 ENCOUNTER — Encounter (HOSPITAL_COMMUNITY): Payer: Self-pay

## 2020-07-30 ENCOUNTER — Ambulatory Visit (HOSPITAL_COMMUNITY)
Admission: RE | Admit: 2020-07-30 | Discharge: 2020-07-30 | Disposition: A | Discharge status: Home / Self Care | Payer: Medicare HMO | Source: Ambulatory Visit | Attending: Cardiovascular Disease | Admitting: Cardiovascular Disease

## 2020-07-30 DIAGNOSIS — Z952 Presence of prosthetic heart valve: Secondary | ICD-10-CM | POA: Diagnosis present

## 2020-07-30 DIAGNOSIS — I351 Nonrheumatic aortic (valve) insufficiency: Secondary | ICD-10-CM

## 2020-07-30 MED ORDER — IOHEXOL 350 MG/ML SOLN
80.0000 mL | Freq: Once | INTRAVENOUS | Status: AC | PRN
  Administered 2020-07-30: 80 mL via INTRAVENOUS

## 2020-07-31 ENCOUNTER — Other Ambulatory Visit: Payer: Self-pay | Admitting: Physician Assistant

## 2020-07-31 ENCOUNTER — Telehealth: Payer: Self-pay | Admitting: Physician Assistant

## 2020-07-31 DIAGNOSIS — Z720 Tobacco use: Secondary | ICD-10-CM

## 2020-07-31 DIAGNOSIS — R0602 Shortness of breath: Secondary | ICD-10-CM

## 2020-07-31 DIAGNOSIS — Z952 Presence of prosthetic heart valve: Secondary | ICD-10-CM

## 2020-07-31 NOTE — Progress Notes (Signed)
[facility]                                     Cardiology Office Note:    Date:  08/01/2020   ID:  [name], DOB 1935/09/11, MRN [id number]  PCP:  Leonard Downing, MD  [facility] Cardiologist:  Lauree Chandler, MD  [facility] Electrophysiologist:  Virl Axe, MD   Referring MD: Leonard Downing, *   CC: Dyspnea and LE edema.  History of Present Illness:    [name] is a 85 y.o. 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   To recap history, she was admitted in 2020 for near-syncope due to 2:1 AVB with intermittent CHB and also was found to have severe AS. She underwent [employer] Jude PPM implant. Cath 10/2018 showed nonobstructive CAD and she went onto have TAVR 11/2018 with a 23 mm Edwards Sapien 3 Ultra THV via the subclavian approach. Post-hospital course was notable for Plavix allergy, so she has been continued on aspirin indefinitely. She was recently hospitalized 05/2020 for ABL anemia (Hgb 9-10 range) with melena 2/2 UGIB. EGD showed hiatal hernia, gastritis, and nonbleeding duodenal ulcer with + H Pylori. She was treated for this and cleared to resume her ASA. She was readmitted 4/6-07/06/20 with SOB and black stools. Her Hgb was stable so was not felt to require further procedures. GI recommended to hold antibiotics for H. Pylori with plan to revisit different regimen as outpatient. She was continued on PPI. D-dimer was elevated but CTA was negative for PE and LE venous duplex negative for  DVT. Cardiology was consulted for possible contribution of CHF and mildly elevated troponin. 2D Echo 07/06/20 EF 50-55%, apical hypokinesis, mild LVH, mildly enlarged RV, moderately elevated PASP (55.52mm hg), dilated IVC, mild-moderate AI. Her apical hypokinesis was felt to be a reflection of RV pacing, not ischemic process. Troponins were low and flat 33-36 not felt to reflect ACS. She was treated with a dose of IV Lasix and recommended for PO Lasix x 4 days for possible component of fluid overload in the setting of IV fluid administration for GI bleed.  She was seen by Melina Copa Pa-C in the office on 07/12/20 and she continued to complain of shortness of breath. This was felt to be multifactorial due to underlying COPD (no formal diagnosis), anemia and CHF. She was felt to be euvolemic at that time and further diuretics were not recommended. Follow up lab work showed Hg was actually in a normal range at 12.3. Given new moderate aortic insufficiency, her echo was discussed with the multidisciplinary valve team. Cardiac CT was recommended. This showed central AI with hypo-attenuation and leaflet thickening. Given that she was not a good anticoagulation candidate with recent GI bleed and leaflet thickening can come and go, it was felt best to continue observation with serial echos. Repeat echo is scheduled for 10/09/20.   The patient called in reporting worsening dyspnea and also new LE edema and she was  added onto my schedule for evaluation. She has had some swelling in her feet. She sleep on the couch with her back and leg elevated. She reports ongoing wheezing for which she has albuterol and a chronic cough that is productive of white phlegm. Dyspnea seems to be worse in the morning and better in the afternoon.   Past Medical History:  Diagnosis Date  . Anxiety    Stable  . ASCUS on Pap smear 11/15/09  . Carotid artery disease (HCC)    1-39% RICA stenosis and 81-19% LICA stenosis on pre TAVR dopplers   .  Depression   . Duodenal ulcer due to Helicobacter pylori 1/47/8295  . Endometrial polyp 11/15/2009  . Endometrioid adenocarcinoma   . H/O varicella   . Hemorrhoids   . History of measles, mumps, or rubella   . Hypercholesterolemia   . Hypertension   . Menopausal symptoms 06/13/10  . Ovarian cyst 11/15/09  . PMB (postmenopausal bleeding) 11/15/09  . Presence of permanent cardiac pacemaker 10/20/2018  . Vaginal bleeding     Past Surgical History:  Procedure Laterality Date  . BIOPSY  06/19/2020   Procedure: BIOPSY;  Surgeon: Rush Landmark Telford Nab., MD;  Location: [facility];  Service: Gastroenterology;;  . BREAST SURGERY     Over twenty - five  years ago  . CATARACT EXTRACTION W/ INTRAOCULAR LENS IMPLANT Bilateral   . CERVICAL BIOPSY    . ESOPHAGOGASTRODUODENOSCOPY (EGD) WITH PROPOFOL N/A 06/19/2020   Procedure: ESOPHAGOGASTRODUODENOSCOPY (EGD) WITH PROPOFOL;  Surgeon: Rush Landmark Telford Nab., MD;  Location: [facility];  Service: Gastroenterology;  Laterality: N/A;  . EYE SURGERY    . INSERT / REPLACE / REMOVE PACEMAKER  10/20/2018  . LAPAROSCOPIC ASSISTED VAGINAL HYSTERECTOMY  12/2009   BSO  . MULTIPLE EXTRACTIONS WITH ALVEOLOPLASTY N/A 12/09/2018   Procedure: EXTRACTION OF TOOTH #'S 6,21-29 AND 32 WITH ALVEOLOPLASTY AND BILATERAL MANDIBULAR TORI REDUCTIONS;  Surgeon: Lenn Cal, DDS;  Location: [place];  Service: Oral Surgery;  Laterality: N/A;  . PACEMAKER IMPLANT N/A 10/20/2018   Procedure: PACEMAKER IMPLANT;  Surgeon: Deboraha Sprang, MD;  Location: [facility];  Service: Cardiovascular;  Laterality: N/A;  . RIGHT/LEFT HEART CATH AND CORONARY ANGIOGRAPHY N/A 11/24/2018   Procedure: RIGHT/LEFT HEART CATH AND CORONARY ANGIOGRAPHY;  Surgeon: Burnell Blanks, MD;  Location: [facility];  Service: Cardiovascular;  Laterality: N/A;  . TEE WITHOUT CARDIOVERSION N/A 12/21/2018   Procedure: TRANSESOPHAGEAL ECHOCARDIOGRAM (TEE);  Surgeon: Burnell Blanks,  MD;  Location: [place];  Service: Open Heart Surgery;  Laterality: N/A;    Current Medications: Current Meds  Medication Sig  . acetaminophen (TYLENOL) 500 MG tablet Take 500 mg by mouth at bedtime.  Marland Kitchen albuterol (VENTOLIN HFA) 108 (90 Base) MCG/ACT inhaler Inhale 2 puffs into the lungs every 4 (four) hours as needed for wheezing or shortness of breath.  Marland Kitchen amLODipine (NORVASC) 10 MG tablet Take 10 mg by mouth daily.  Marland Kitchen aspirin 81 MG chewable tablet Chew 1 tablet (81 mg total) by mouth daily.  . carboxymethylcellulose (REFRESH PLUS) 0.5 % SOLN Place 1 drop into both eyes daily as needed (dry eyes).   . Cholecalciferol (VITAMIN D-3) 125 MCG (5000 UT) TABS Take 5,000 Units by mouth daily.  . citalopram (CELEXA) 40 MG tablet Take 20 mg by mouth daily.  . furosemide (LASIX) 20 MG tablet Take 1 tablet (20 mg total) by mouth daily.  . hydrOXYzine (VISTARIL) 50 MG capsule Take 1 capsule (50 mg total) by  mouth 3 (three) times daily as needed for anxiety.  . Omega-3 Fatty Acids (FISH OIL) 1000 MG CAPS Take 1,000 mg by mouth daily.  . pantoprazole (PROTONIX) 40 MG tablet Take 1 tablet (40 mg total) by mouth 2 (two) times daily.  . simvastatin (ZOCOR) 20 MG tablet Take 20 mg by mouth daily.  . sucralfate (CARAFATE) 1 GM/10ML suspension Take 10 mLs (1 g total) by mouth 4 (four) times daily -  with meals and at bedtime.  . vitamin B-12 (CYANOCOBALAMIN) 500 MCG tablet Take 500 mcg by mouth daily.  . vitamin E 180 MG (400 UNITS) capsule Take 400 Units by mouth daily.  . [DISCONTINUED] metoprolol tartrate (LOPRESSOR) 100 MG tablet Take one tablet by mouth with a few sips of water on 07/30/20 at 9:00 AM prior to CT scan     Allergies:   Patient has no known allergies.   Social History   Socioeconomic History  . Marital status: Legally Separated    Spouse name: Not on file  . Number of children: Not on file  . Years of education: Not on file  . Highest education level: Not on file  Occupational History  .  Occupation: Retired-sales rep  Tobacco Use  . Smoking status: Current Every Day Smoker    Packs/day: 0.50    Years: 55.00    Pack years: 27.50    Types: Cigarettes  . Smokeless tobacco: Never Used  . Tobacco comment: 3 cig/day  Vaping Use  . Vaping Use: Never used  Substance and Sexual Activity  . Alcohol use: No  . Drug use: Not Currently  . Sexual activity: Not on file  Other Topics Concern  . Not on file  Social History Narrative  . Not on file   Social Determinants of Health   Financial Resource Strain: Not on file  Food Insecurity: Not on file  Transportation Needs: Not on file  Physical Activity: Not on file  Stress: Not on file  Social Connections: Not on file     Family History: The patient's family history includes Heart disease in her brother; Heart failure in her mother; Lung cancer in her brother; Parkinson's disease in her father.  ROS:   Please see the history of present illness.    All other systems reviewed and are negative.  EKGs/Labs/Other Studies Reviewed:    The following studies were reviewed today:  2D echo 07/06/20 1. Left ventricular ejection fraction, by estimation, is 50 to 55%. The  left ventricle has low normal function. The left ventricle demonstrates  regional wall motion abnormalities (see scoring diagram/findings for  description). Apical hypokinesis. There  is mild left ventricular hypertrophy. Left ventricular diastolic  parameters are indeterminate.  2. Right ventricular systolic function is normal. The right ventricular  size is mildly enlarged. There is moderately elevated pulmonary artery  systolic pressure. The estimated right ventricular systolic pressure is  61.4 mmHg.  3. The mitral valve is abnormal. Trivial mitral valve regurgitation.  Severe mitral annular calcification.  4. The aortic valve has been repaired/replaced. There is a 23 mm Edwards  Sapien prosthetic, stented (TAVR) valve present in the aortic position.   Aortic valve regurgitation is mild to moderate. Mean gradient 8 mmHg,  stable from prior echo 12/08/19  5. The inferior vena cava is dilated in size with <50% respiratory  variability, suggesting right atrial pressure of 15 mmHg.    ______________________   Cardiac CT 08/01/2020 IMPRESSION: 1. Post TAVR with 23 mm Sapien 3 valve.  Mechanism of AR would appear to be central valvular AR and not peri valvular or supra skirtal The is hypo attenuation and thickening of the leaflets at the  Basal commissures with central area of mal-coaptation The stent valve itself dose not appear to be over expanded and was not post dilated during the procedure  2. Coronary arteries reside above/at upper edge of stent valve with good lumen  3.  Pacing wires noted in RA/RV  4.  Normal aortic root 2.8 cm  5. Normal LV size and function with abnormal septal motion from pacing suggesting that LV dilatation is not cause of AR  [place]    EKG:  EKG is NOT ordered today.    Recent Labs: 06/18/2020: ALT 11 06/20/2020: Magnesium 1.8 07/05/2020: TSH 2.249 07/12/2020: BUN 13; Creatinine, Ser 0.79; Hemoglobin 12.3; Platelets 186; Potassium 3.7; Sodium 143  Recent Lipid Panel No results found for: CHOL, TRIG, HDL, CHOLHDL, VLDL, LDLCALC, LDLDIRECT   Risk Assessment/Calculations:       Physical Exam:    VS:  BP (!) 138/50   Pulse 81   Ht 5\' 4"  (1.626 m)   Wt 104 lb 3.2 oz (47.3 kg)   SpO2 97%   BMI 17.89 kg/m     Wt Readings from Last 3 Encounters:  08/01/20 104 lb 3.2 oz (47.3 kg)  07/12/20 104 lb 12.8 oz (47.5 kg)  07/05/20 108 lb 7.5 oz (49.2 kg)     GEN:  Well nourished, well developed in no acute distress, thin, chronically ill appearing. HEENT: Normal NECK: No JVD LYMPHATICS: No lymphadenopathy CARDIAC: RRR, no murmurs, rubs, gallops RESPIRATORY:  Clear to auscultation without rales, wheezing or rhonchi  ABDOMEN: Soft, non-tender, non-distended MUSCULOSKELETAL:  Trace  pedal edema; No deformity  SKIN: Warm and dry NEUROLOGIC:  Alert and oriented x 3 PSYCHIATRIC:  Normal affect   ASSESSMENT:    1. SOB (shortness of breath)   2. S/P TAVR (transcatheter aortic valve replacement)   3. Chronic diastolic CHF (congestive heart failure) ([place])   4. Pulmonary hypertension, unspecified ([place])   5. History of pacemaker   6. History of GI bleed    PLAN:    In order of problems listed above:  SOB: this has been an ongoing issue for her. She says it is chronic but has been worse since her recent admissions. She has a chronic cough that is productive of thick, white mucus and often wheezes. She does not appear volume overloaded but I will check a BNP to rule out occult CHF and have started her on a low dose diuretic to see if this helps. She has mild to moderate AI, but I do not think this explains her degree of dyspnea. Recent labs showed return of her hemoglobin to normal and CT did not show a PE. Her resting 02 sats were 97% but they did drop to 89% with ambulation. She has smoked for over 30 years and has never been formally evaluated by pulm. I have placed a referral as I have a high suspicion for COPD.   S/p TAVR with central AI: cardiac CT showed central AI with hypo-attenuation and leaflet thickening. Given that she was not a good anticoagulation candidate with recent GI bleed and leaflet thickening can come and go, it was felt best to continue observation with serial echos. Repeat echo is scheduled for 10/09/20.   Chronic diastolic CHF: overall appears euvolemic. Weights are stable but she does have trace pedal edema. As above, will check a  BNP to rule out occult CHF and start her on lasix 20mg  daily.   Pulm HTN: RVSP 55.7 mm hg  S/p PPM: followed by Dr. Caryl Comes   Recent GI bleed: Hg normalized by labwork 2 weeks ago. Will recheck today.    Medication Adjustments/Labs and Tests Ordered: Current medicines are reviewed at length with the patient today.  Concerns  regarding medicines are outlined above.  Orders Placed This Encounter  Procedures  . CBC with Differential/Platelet  . Basic metabolic panel  . Pro b natriuretic peptide (BNP)   Meds ordered this encounter  Medications  . furosemide (LASIX) 20 MG tablet    Sig: Take 1 tablet (20 mg total) by mouth daily.    Dispense:  90 tablet    Refill:  3    Patient Instructions  Medication Instructions:  1) START LASIX 20 mg daily  *If you need a refill on your cardiac medications before your next appointment, please call your pharmacy*  Lab Work: TODAY! BNP, BMET, CBC If you have labs (blood work) drawn today and your tests are completely normal, you will receive your results only by: Marland Kitchen MyChart Message (if you have MyChart) OR . A paper copy in the mail If you have any lab test that is abnormal or we need to change your treatment, we will call you to review the results.  Follow-Up: Dr. Camillia Herter nurse will call you to arrange follow-up after your ultrasound this summer.    Signed, Angelena Form, PA-C  08/01/2020 4:32 PM     [facility]

## 2020-07-31 NOTE — Telephone Encounter (Signed)
Did not need this encounter °

## 2020-08-01 ENCOUNTER — Ambulatory Visit (INDEPENDENT_AMBULATORY_CARE_PROVIDER_SITE_OTHER): Payer: Medicare HMO | Admitting: Physician Assistant

## 2020-08-01 ENCOUNTER — Other Ambulatory Visit: Payer: Self-pay

## 2020-08-01 ENCOUNTER — Encounter: Payer: Self-pay | Admitting: Physician Assistant

## 2020-08-01 VITALS — BP 138/50 | HR 81 | Ht 64.0 in | Wt 104.2 lb

## 2020-08-01 DIAGNOSIS — I5032 Chronic diastolic (congestive) heart failure: Secondary | ICD-10-CM

## 2020-08-01 DIAGNOSIS — I272 Pulmonary hypertension, unspecified: Secondary | ICD-10-CM

## 2020-08-01 DIAGNOSIS — Z952 Presence of prosthetic heart valve: Secondary | ICD-10-CM

## 2020-08-01 DIAGNOSIS — R0602 Shortness of breath: Secondary | ICD-10-CM | POA: Diagnosis not present

## 2020-08-01 DIAGNOSIS — Z950 Presence of cardiac pacemaker: Secondary | ICD-10-CM

## 2020-08-01 DIAGNOSIS — Z8719 Personal history of other diseases of the digestive system: Secondary | ICD-10-CM

## 2020-08-01 MED ORDER — FUROSEMIDE 20 MG PO TABS: 20.0000 mg | ORAL_TABLET | Freq: Every day | ORAL | 3 refills | Status: DC

## 2020-08-01 NOTE — Patient Instructions (Addendum)
Medication Instructions:  1) START LASIX 20 mg daily  *If you need a refill on your cardiac medications before your next appointment, please call your pharmacy*  Lab Work: TODAY! BNP, BMET, CBC If you have labs (blood work) drawn today and your tests are completely normal, you will receive your results only by: Marland Kitchen MyChart Message (if you have MyChart) OR . A paper copy in the mail If you have any lab test that is abnormal or we need to change your treatment, we will call you to review the results.  Follow-Up: Dr. Camillia Herter nurse will call you to arrange follow-up after your ultrasound this summer.

## 2020-08-02 LAB — CBC WITH DIFFERENTIAL/PLATELET
Basophils Absolute: 0 10*3/uL (ref 0.0–0.2)
Basos: 1 %
EOS (ABSOLUTE): 0.1 10*3/uL (ref 0.0–0.4)
Eos: 3 %
Hematocrit: 40.6 % (ref 34.0–46.6)
Hemoglobin: 12.7 g/dL (ref 11.1–15.9)
Immature Grans (Abs): 0 10*3/uL (ref 0.0–0.1)
Immature Granulocytes: 0 %
Lymphocytes Absolute: 1 10*3/uL (ref 0.7–3.1)
Lymphs: 18 %
MCH: 28.2 pg (ref 26.6–33.0)
MCHC: 31.3 g/dL — ABNORMAL LOW (ref 31.5–35.7)
MCV: 90 fL (ref 79–97)
Monocytes Absolute: 0.6 10*3/uL (ref 0.1–0.9)
Monocytes: 11 %
Neutrophils Absolute: 3.8 10*3/uL (ref 1.4–7.0)
Neutrophils: 67 %
Platelets: 186 10*3/uL (ref 150–450)
RBC: 4.5 x10E6/uL (ref 3.77–5.28)
RDW: 13.1 % (ref 11.7–15.4)
WBC: 5.6 10*3/uL (ref 3.4–10.8)

## 2020-08-02 LAB — BASIC METABOLIC PANEL
BUN/Creatinine Ratio: 18 (ref 12–28)
BUN: 13 mg/dL (ref 8–27)
CO2: 24 mmol/L (ref 20–29)
Calcium: 9.4 mg/dL (ref 8.7–10.3)
Chloride: 101 mmol/L (ref 96–106)
Creatinine, Ser: 0.71 mg/dL (ref 0.57–1.00)
Glucose: 78 mg/dL (ref 65–99)
Potassium: 3.9 mmol/L (ref 3.5–5.2)
Sodium: 142 mmol/L (ref 134–144)
eGFR: 85 mL/min/{1.73_m2} (ref >59)

## 2020-08-02 LAB — PRO B NATRIURETIC PEPTIDE: NT-Pro BNP: 1574 pg/mL — ABNORMAL HIGH (ref 0–738)

## 2020-08-07 NOTE — Telephone Encounter (Signed)
Reviewed results with patient who verbalized understanding.   The patient will come in for repeat BMET 08/13/20. She was grateful for call and agrees with plan.

## 2020-08-07 NOTE — Telephone Encounter (Signed)
-----   Message from Willeen Cass, RN sent at 08/02/2020  8:50 AM EDT -----  ----- Message ----- From: Eileen Stanford, PA-C Sent: 08/02/2020   8:48 AM EDT To: [facility] Triage  Labs including hemoglobin are normal with the exception of her BNP was a little elevated meaning extra fluid could be contributing to her shortness of breath. Let's see how she does on the lasix 20mg  daily. Her potassium was 3.9, so I would like to get a follow up BMET in 1.5 weeks to make sure she does not need a potassium supplementation. Can this be arranged? Thank you!

## 2020-08-13 ENCOUNTER — Other Ambulatory Visit: Payer: Self-pay

## 2020-08-13 ENCOUNTER — Other Ambulatory Visit: Payer: Medicare HMO | Admitting: *Deleted

## 2020-08-13 DIAGNOSIS — R0602 Shortness of breath: Secondary | ICD-10-CM

## 2020-08-14 LAB — BASIC METABOLIC PANEL
BUN/Creatinine Ratio: 21 (ref 12–28)
BUN: 18 mg/dL (ref 8–27)
CO2: 21 mmol/L (ref 20–29)
Calcium: 9.4 mg/dL (ref 8.7–10.3)
Chloride: 99 mmol/L (ref 96–106)
Creatinine, Ser: 0.85 mg/dL (ref 0.57–1.00)
Glucose: 109 mg/dL — ABNORMAL HIGH (ref 65–99)
Potassium: 4.1 mmol/L (ref 3.5–5.2)
Sodium: 139 mmol/L (ref 134–144)
eGFR: 68 mL/min/{1.73_m2} (ref >59)

## 2020-08-15 NOTE — Progress Notes (Signed)
Remote pacemaker transmission.   

## 2020-09-05 ENCOUNTER — Other Ambulatory Visit: Payer: Medicare HMO

## 2020-09-05 ENCOUNTER — Encounter: Payer: Self-pay | Admitting: Emergency Medicine

## 2020-09-05 ENCOUNTER — Other Ambulatory Visit: Payer: Self-pay

## 2020-09-05 ENCOUNTER — Ambulatory Visit (INDEPENDENT_AMBULATORY_CARE_PROVIDER_SITE_OTHER): Payer: Medicare HMO | Admitting: Emergency Medicine

## 2020-09-05 VITALS — BP 104/50 | HR 74 | Temp 98.0°F | Ht 65.0 in | Wt 101.6 lb

## 2020-09-05 DIAGNOSIS — R0609 Other forms of dyspnea: Secondary | ICD-10-CM

## 2020-09-05 DIAGNOSIS — A048 Other specified bacterial intestinal infections: Secondary | ICD-10-CM

## 2020-09-05 DIAGNOSIS — R0600 Dyspnea, unspecified: Secondary | ICD-10-CM | POA: Diagnosis not present

## 2020-09-05 MED ORDER — STIOLTO RESPIMAT 2.5-2.5 MCG/ACT IN AERS: 2.0000 | INHALATION_SPRAY | Freq: Every day | RESPIRATORY_TRACT | 0 refills | Status: DC

## 2020-09-05 NOTE — Assessment & Plan Note (Signed)
Likely multifactorial as she does have significant cardiac disease that has been documented.  She has benefited some from the addition of diuretics.  She also has anemia from GI bleeding.  Certainly COPD looks to be a contributor.  She has an extensive tobacco history, clinical history is consistent.  We will quantify her degree of obstruction with pulmonary function testing.  I will try her on empiric Stiolto and assess her benefit.  We will perform walking oximetry today to rule out occult desaturation.  We will arrange for pulmonary function testing in next office visit Walking oximetry today on room air Please start Stiolto 2 puffs once daily.  Keep track of whether this medication helps your breathing.  If it does then we will continue it going forward. Keep your albuterol available to use 2 puffs if needed for shortness of breath, chest tightness, wheezing. Congratulations on decreasing your smoking.  We need to work hard on decreasing further.  Our ultimate goal will be to stop altogether. Follow with Dr. Lamonte Sakai next available with full pulmonary function testing on the same day.

## 2020-09-05 NOTE — Addendum Note (Signed)
Addended by: Lia Foyer R on: 09/05/2020 04:23 PM   Modules accepted: Orders

## 2020-09-05 NOTE — Progress Notes (Signed)
Subjective:    Patient ID: [name], female    DOB: [birth year], 85 y.o.   MRN: [id number]  HPI 85 year old smoker (60+ pack years) with a history of CAD, third degree AV block with pacer, hypertension, severe aortic stenosis post TAVR 11/2018 with associated moderate pulmonary hypertension, endometrial cancer, GI bleeding - recently started on PPI.  She is on scheduled diuretics, aspirin. She is referred for exertional shortness of breath. She has chest tightness with exertion, sputum that she needs to clear. Coughs frequently - thick white mucous. She has wheeze most days. She tires easily. Has to stop to rest after cooking or cleaning, shopping. She uses albuterol 2-3 days a week, helps breathing and mucous clearance.    Review of Systems As per HPI  Past Medical History:  Diagnosis Date  . Anxiety    Stable  . ASCUS on Pap smear 11/15/09  . Carotid artery disease (HCC)    1-39% RICA stenosis and 18-29% LICA stenosis on pre TAVR dopplers   . Depression   . Duodenal ulcer due to Helicobacter pylori 9/37/1696  . Endometrial polyp 11/15/2009  . Endometrioid adenocarcinoma   . H/O varicella   . Hemorrhoids   . History of measles, mumps, or rubella   . Hypercholesterolemia   . Hypertension   . Menopausal symptoms 06/13/10  . Ovarian cyst 11/15/09  . PMB (postmenopausal bleeding) 11/15/09  . Presence of permanent cardiac pacemaker 10/20/2018  . Vaginal bleeding      Family History  Problem Relation Age of Onset  . Lung cancer Brother   . Heart disease Brother   . Heart failure Mother   . Parkinson's disease Father      Social History   Socioeconomic History  . Marital status: Legally Separated    Spouse name: Not on file  . Number of children: Not on file  . Years of education: Not on file  . Highest education level: Not on file  Occupational History  . Occupation: Retired-sales rep  Tobacco Use  . Smoking status: Current Every Day Smoker    Packs/day: 0.50     Years: 55.00    Pack years: 27.50    Types: Cigarettes  . Smokeless tobacco: Never Used  . Tobacco comment: 4-5 a day  Vaping Use  . Vaping Use: Never used  Substance and Sexual Activity  . Alcohol use: No  . Drug use: Not Currently  . Sexual activity: Not on file  Other Topics Concern  . Not on file  Social History Narrative  . Not on file   Social Determinants of Health   Financial Resource Strain: Not on file  Food Insecurity: Not on file  Transportation Needs: Not on file  Physical Activity: Not on file  Stress: Not on file  Social Connections: Not on file  Intimate Partner Violence: Not on file     No Known Allergies   Outpatient Medications Prior to Visit  Medication Sig Dispense Refill  . acetaminophen (TYLENOL) 500 MG tablet Take 500 mg by mouth at bedtime.    Marland Kitchen albuterol (VENTOLIN HFA) 108 (90 Base) MCG/ACT inhaler Inhale 2 puffs into the lungs every 4 (four) hours as needed for wheezing or shortness of breath. 8 g 1  . amLODipine (NORVASC) 10 MG tablet Take 10 mg by mouth daily.    Marland Kitchen aspirin 81 MG chewable tablet Chew 1 tablet (81 mg total) by mouth daily.    . carboxymethylcellulose (REFRESH PLUS) 0.5 % SOLN Place 1  drop into both eyes daily as needed (dry eyes).     . Cholecalciferol (VITAMIN D-3) 125 MCG (5000 UT) TABS Take 5,000 Units by mouth daily.    . citalopram (CELEXA) 40 MG tablet Take 20 mg by mouth daily.    . furosemide (LASIX) 20 MG tablet Take 1 tablet (20 mg total) by mouth daily. 90 tablet 3  . hydrOXYzine (VISTARIL) 50 MG capsule Take 1 capsule (50 mg total) by mouth 3 (three) times daily as needed for anxiety.    . Omega-3 Fatty Acids (FISH OIL) 1000 MG CAPS Take 1,000 mg by mouth daily.    . pantoprazole (PROTONIX) 40 MG tablet Take 1 tablet (40 mg total) by mouth 2 (two) times daily. 60 tablet 1  . simvastatin (ZOCOR) 20 MG tablet Take 20 mg by mouth daily.    . sucralfate (CARAFATE) 1 GM/10ML suspension Take 10 mLs (1 g total) by mouth 4  (four) times daily -  with meals and at bedtime. 1260 mL 0  . vitamin B-12 (CYANOCOBALAMIN) 500 MCG tablet Take 500 mcg by mouth daily.    . vitamin E 180 MG (400 UNITS) capsule Take 400 Units by mouth daily.     No facility-administered medications prior to visit.        Objective:   Physical Exam Vitals:   09/05/20 1536  BP: (!) 104/50  Pulse: 74  Temp: 98 F (36.7 C)  TempSrc: Temporal  SpO2: 94%  Weight: 101 lb 9.6 oz (46.1 kg)  Height: 5\' 5"  (1.651 m)   Gen: Pleasant, very thin, in no distress,  normal affect  ENT: No lesions,  mouth clear,  oropharynx clear, no postnasal drip  Neck: No JVD, no stridor  Lungs: No use of accessory muscles, distant, no crackles or wheezing on normal respiration, she does wheeze on forced expiration  Cardiovascular: RRR, distant, 2/6 s M, , no peripheral edema  Musculoskeletal: No deformities, no cyanosis or clubbing  Neuro: alert, awake, non focal  Skin: Warm, no lesions or rash      Assessment & Plan:  DOE (dyspnea on exertion) Likely multifactorial as she does have significant cardiac disease that has been documented.  She has benefited some from the addition of diuretics.  She also has anemia from GI bleeding.  Certainly COPD looks to be a contributor.  She has an extensive tobacco history, clinical history is consistent.  We will quantify her degree of obstruction with pulmonary function testing.  I will try her on empiric Stiolto and assess her benefit.  We will perform walking oximetry today to rule out occult desaturation.  We will arrange for pulmonary function testing in next office visit Walking oximetry today on room air Please start Stiolto 2 puffs once daily.  Keep track of whether this medication helps your breathing.  If it does then we will continue it going forward. Keep your albuterol available to use 2 puffs if needed for shortness of breath, chest tightness, wheezing. Congratulations on decreasing your smoking.   We need to work hard on decreasing further.  Our ultimate goal will be to stop altogether. Follow with Dr. Lamonte Sakai next available with full pulmonary function testing on the same day.   Baltazar Apo, MD, PhD 09/05/2020, 3:56 PM [facility] [phone] or if no answer before 7:00PM call [phone] For any issues after 7:00PM please call eLink [phone]

## 2020-09-05 NOTE — Patient Instructions (Signed)
We will arrange for pulmonary function testing in next office visit Walking oximetry today on room air Please start Stiolto 2 puffs once daily.  Keep track of whether this medication helps your breathing.  If it does then we will continue it going forward. Keep your albuterol available to use 2 puffs if needed for shortness of breath, chest tightness, wheezing. Congratulations on decreasing your smoking.  We need to work hard on decreasing further.  Our ultimate goal will be to stop altogether. Follow with Dr. Lamonte Sakai next available with full pulmonary function testing on the same day.

## 2020-09-06 ENCOUNTER — Other Ambulatory Visit: Payer: Medicare HMO

## 2020-09-06 DIAGNOSIS — A048 Other specified bacterial intestinal infections: Secondary | ICD-10-CM

## 2020-09-07 LAB — HELICOBACTER PYLORI  SPECIAL ANTIGEN
MICRO NUMBER:: 11988867
SPECIMEN QUALITY: ADEQUATE

## 2020-09-09 IMAGING — CT CT ANGIOGRAPHY ABDOMEN AND PELVIS WITH CONTRAST AND WITHOUT CONT
1 of 3 series · 1 of 17 positions shown · IV contrast (omnipaque)
Comparison: CT the abdomen and pelvis 02/19/2010.

CLINICAL DATA: 82-year-old female with history of severe aortic
stenosis. Preprocedural study prior to potential transcatheter
aortic valve replacement (TAVR) procedure.

EXAM:
CT ANGIOGRAPHY CHEST, ABDOMEN AND PELVIS
TECHNIQUE: Multidetector CT imaging through the chest, abdomen and pelvis was
performed using the standard protocol during bolus administration of
intravenous contrast. Multiplanar reconstructed images and MIPs were
obtained and reviewed to evaluate the vascular anatomy.
CONTRAST:  80mL OMNIPAQUE IOHEXOL 350 MG/ML SOLN

[Series 695: — · 0.29mm/px · 1 of 5 slices shown]
[im 3/5]
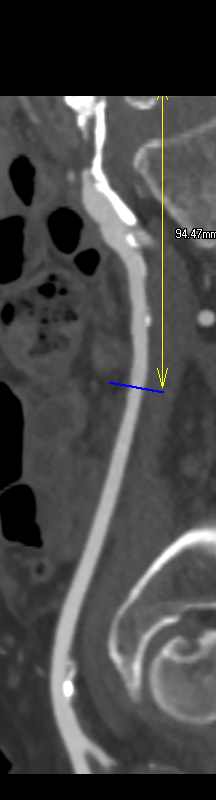

[1 of 17 positions shown; findings below may reference images not displayed]

FINDINGS: CTA CHEST FINDINGS

Cardiovascular: Heart size is normal. There is no significant
pericardial fluid, thickening or pericardial calcification. There is
aortic atherosclerosis, as well as atherosclerosis of the great
vessels of the mediastinum and the coronary arteries, including
calcified atherosclerotic plaque in the left anterior descending,
left circumflex and right coronary arteries. Severe thickening
calcification of the aortic valve. Moderate calcifications of the
mitral annulus. Left-sided pacemaker device in place with lead tips
terminating in the right atrial appendage and right ventricular
apex.

Mediastinum/Lymph Nodes: No pathologically enlarged mediastinal or
hilar lymph nodes. Esophagus is unremarkable in appearance. No
axillary lymphadenopathy.

Lungs/Pleura: No suspicious pulmonary nodules or masses are noted.
No acute consolidative airspace disease. No pleural effusions.

Musculoskeletal/Soft Tissues: There are no aggressive appearing
lytic or blastic lesions noted in the visualized portions of the
skeleton.

CTA ABDOMEN AND PELVIS FINDINGS

Hepatobiliary: No suspicious cystic or solid hepatic lesions. No
intra or extrahepatic biliary ductal dilatation. Gallbladder is
normal in appearance.

Pancreas: No pancreatic mass. No pancreatic ductal dilatation. No
pancreatic or peripancreatic fluid collections or inflammatory
changes.

Spleen: Unremarkable.

Adrenals/Urinary Tract: Subcentimeter low-attenuation lesion in the
lateral aspect of the interpolar region of the right kidney, too
small to characterize, but statistically likely to represent a cyst.
Left kidney and bilateral adrenal glands are normal in appearance.
No hydroureteronephrosis. Urinary bladder is normal in appearance.

Stomach/Bowel: Normal appearance of the stomach. No pathologic
dilatation of small bowel or colon. Numerous colonic diverticulae
are noted, without surrounding inflammatory changes to suggest an
acute diverticulitis at this time. Normal appendix.

Vascular/Lymphatic: Aortic atherosclerosis with vascular findings
and measurements pertinent to potential TAVR procedure, as detailed
below, including complete occlusion of the right common iliac
artery. No aneurysm or dissection noted in the abdominal or pelvic
vasculature. Large ulcerated plaque in the proximal abdominal aorta
adjacent to the origin of the superior mesenteric artery. No
lymphadenopathy noted in the abdomen or pelvis.

Reproductive: Status post hysterectomy. Ovaries are not confidently
identified may be surgically absent or atrophic.

Other: No significant volume of ascites.  No pneumoperitoneum.

Musculoskeletal: There are no aggressive appearing lytic or blastic
lesions noted in the visualized portions of the skeleton.

VASCULAR MEASUREMENTS PERTINENT TO TAVR:

AORTA:

Minimal Aortic 0iameter-8.L x 4.4 mm

Severity of Aortic Calcification-severe

RIGHT PELVIS:

Right Common Iliac Artery -

Minimal Diameter-occluded

Tortuosity-mild

Calcification-severe

Right External Iliac Artery -

Minimal Kiameter-N.P x 3.7 mm

Tortuosity - mild

Calcification-moderate

Right Common Femoral Artery -

Minimal 2iameter-U.F x 4.2 mm

Tortuosity - mild

Calcification-moderate

LEFT PELVIS:

Left Common Iliac Artery -

Minimal 8iameter-Q.J x 3.3 mm

Tortuosity - mild

Calcification-moderate

Left External Iliac Artery -

Minimal Liameter-X.R x 5.0 mm

Tortuosity - mild

Calcification-moderate

Left Common Femoral Artery -

Minimal Kiameter-E.E x 4.9 mm

Tortuosity - mild

Calcification-mild-to-moderate

Review of the MIP images confirms the above findings.
IMPRESSION: 1. Vascular findings and measurements pertinent to potential TAVR
procedure, as detailed above, including complete occlusion of the
right common iliac artery.
2. Severe thickening calcification of the aortic valve, compatible
with the reported clinical history of severe aortic stenosis.
3. Aortic atherosclerosis, in addition to 3 vessel coronary artery
disease.
4. Colonic diverticulosis without evidence of acute diverticulitis
at this time.
5. Additional incidental findings, as above.

## 2020-09-09 IMAGING — CT CT HEAR MORPH WITH CTA COR WITH SCORE WITH CA WITH CONTRAST AND
2 of 7 series · 11 of 20 positions shown, 13 images · non-contrast
Comparison: None.
COMPARISON: None.

Addendum:
EXAM:
OVER-READ INTERPRETATION  CT CHEST

The following report is an over-read performed by radiologist Dr.
Yuliana Swope [REDACTED] on 11/26/2018. This
over-read does not include interpretation of cardiac or coronary
anatomy or pathology. The coronary calcium score/coronary CTA
interpretation by the cardiologist is attached.
CLINICAL DATA: 82-year-old male with severe aortic stenosis being
evaluated for a TAVR procedure.
Cardiac TAVR CT
TECHNIQUE: The patient was scanned on a Phillips Force scanner. A 120 kV
retrospective scan was triggered in the descending thoracic aorta at
111 HU's. Gantry rotation speed was 250 msecs and collimation was .6
mm. No beta blockade or nitro were given. The 3D data set was
reconstructed in 5% intervals of the R-R cycle. Systolic and
diastolic phases were analyzed on a dedicated work station using
MPR, MIP and VRT modes. The patient received 80 cc of contrast.

[Series 6: 0-90% · axial · 0.39mm/px · z∈[-118,+62]mm · 5 of 4510 slices shown]
[im 752/4510  vessel]
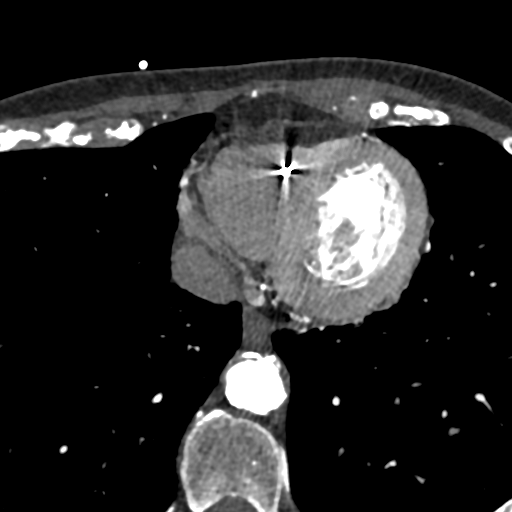
[im 1504/4510  vessel]
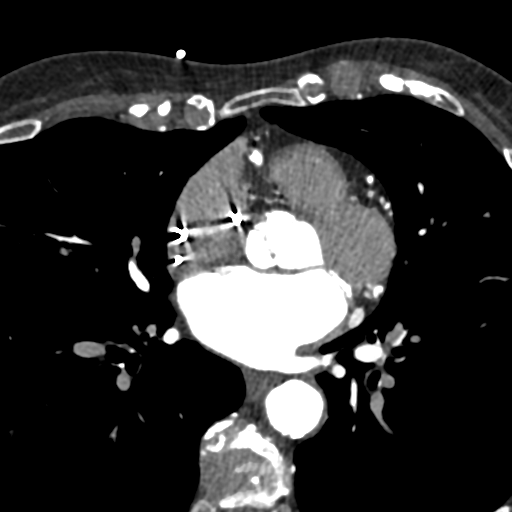
[im 2255/4510  vessel]
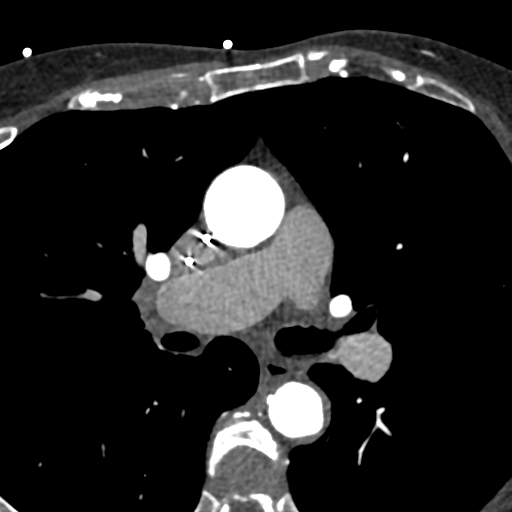
[im 3007/4510  vessel]
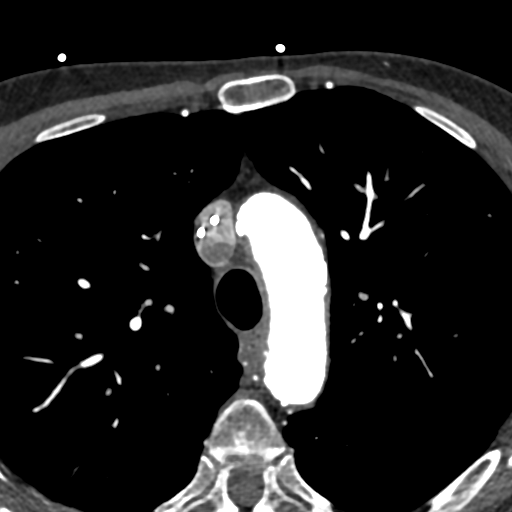
[im 3758/4510  vessel]
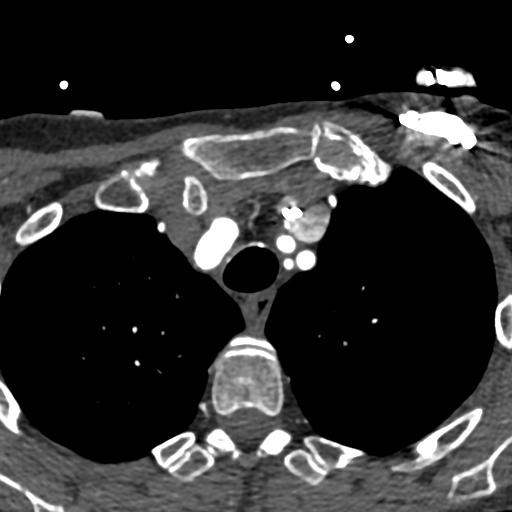

[Series 7: 5-95% · axial · 0.39mm/px · z∈[-125,+68]mm · 6 of 4510 slices shown, 8 images]
[im 645/4510  vessel]
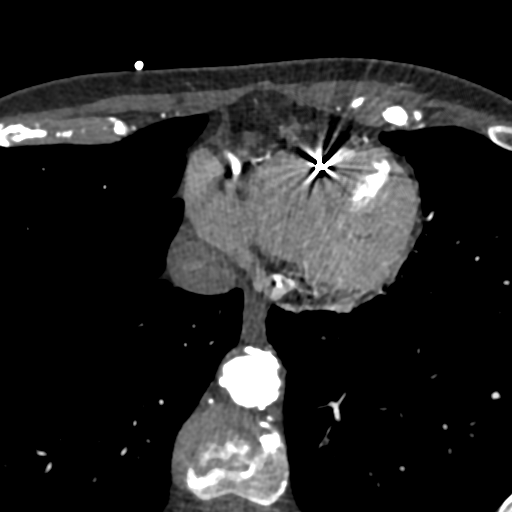
[im 645/4510  lung]
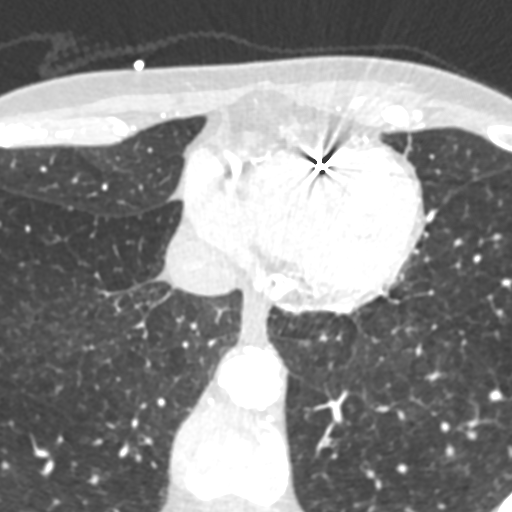
[im 1289/4510  vessel]
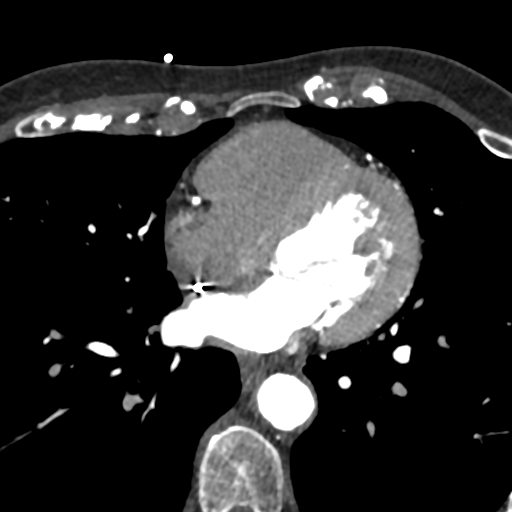
[im 1933/4510  vessel]
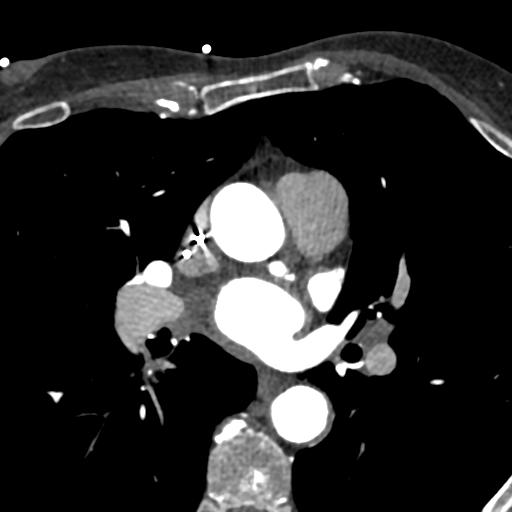
[im 2577/4510  vessel]
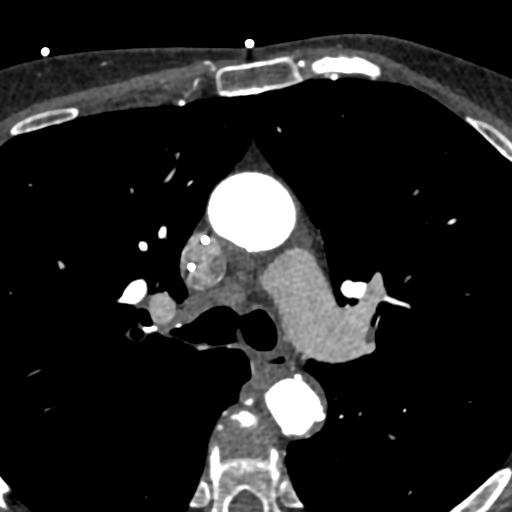
[im 3221/4510  vessel]
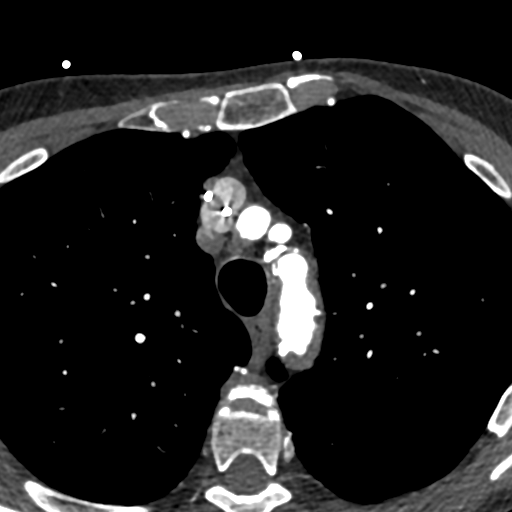
[im 3221/4510  lung]
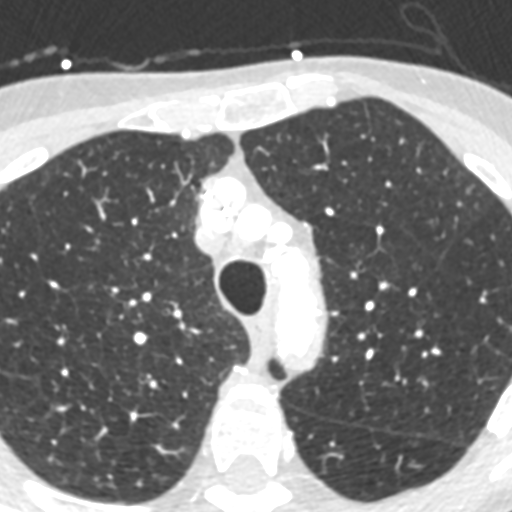
[im 3865/4510  vessel]
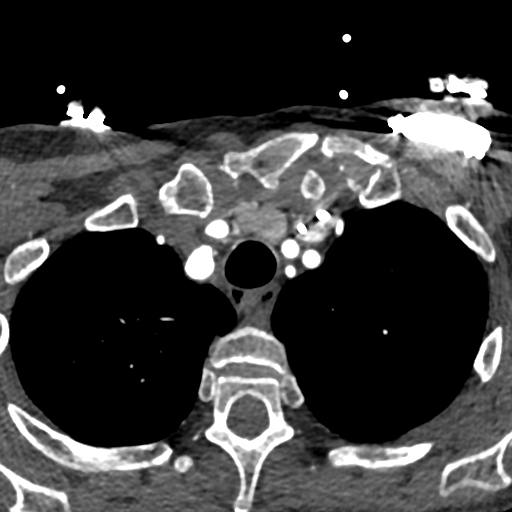

[11 of 20 positions shown; findings below may reference images not displayed]

FINDINGS: Extracardiac findings will be described separately under dictation
for contemporaneously obtained CTA chest, abdomen and pelvis.
IMPRESSION: Please see separate dictation for contemporaneously obtained CTA
chest, abdomen and pelvis 11/26/2018 for full description of
relevant extracardiac findings.
FINDINGS: Aortic Valve: Trileaflet aortic valve with severely thickened and
calcified leaflets and no calcifications extending into the LVOT.

Aorta: Normal size with moderate diffuse atherosclerotic plaque and
calcifications predominantly in the aortic arch and descending
aorta. No dissection.

Sinotubular Junction: 25 x 23 mm

Ascending Thoracic Aorta: 32 x 30 mm

Aortic Arch: 25 x 24 mm

Descending Thoracic Aorta: 21 x 20 mm

Sinus of Valsalva Measurements:

Non-coronary: 27 mm

Right -coronary: 25 mm

Left -coronary: 27 mm

Coronary Artery Height above Annulus:

Left Main: 14 mm

Right Coronary: 15 mm

Virtual Basal Annulus Measurements:

Maximum/Minimum Diameter: 23.3 x 19.7 mm

Mean Diameter: 21.2 mm

Perimeter: 67.3 mm

Area: 354 mm2

Optimum Fluoroscopic Angle for Delivery: LAO 4 BENJI 7
IMPRESSION: 1. Trileaflet aortic valve with severely thickened and calcified
leaflets and no calcifications extending into the LVOT. Aortic valve
calcium score was 3522 consistent with severe aortic stenosis.
Annular measurements are suitable for delivery of a 23 mm
Edwards-SAPIEN 3 Ultra valve.

2. Sufficient coronary to annulus distance.

3. Optimum Fluoroscopic Angle for Delivery: LAO 4 BENJI 7.

4. No thrombus in the left atrial appendage.

5. Pacemaker leads are seen in the right atrium and right ventricle.

*** End of Addendum ***
EXAM:
OVER-READ INTERPRETATION  CT CHEST

The following report is an over-read performed by radiologist Dr.
Yuliana Swope [REDACTED] on 11/26/2018. This
over-read does not include interpretation of cardiac or coronary
anatomy or pathology. The coronary calcium score/coronary CTA
interpretation by the cardiologist is attached.
FINDINGS: Extracardiac findings will be described separately under dictation
for contemporaneously obtained CTA chest, abdomen and pelvis.
IMPRESSION: Please see separate dictation for contemporaneously obtained CTA
chest, abdomen and pelvis 11/26/2018 for full description of
relevant extracardiac findings.

## 2020-09-12 ENCOUNTER — Other Ambulatory Visit: Payer: Self-pay

## 2020-09-12 MED ORDER — PANTOPRAZOLE SODIUM 20 MG PO TBEC: 20.0000 mg | DELAYED_RELEASE_TABLET | Freq: Every day | ORAL | 3 refills | Status: DC

## 2020-09-13 DIAGNOSIS — Z950 Presence of cardiac pacemaker: Secondary | ICD-10-CM | POA: Insufficient documentation

## 2020-09-14 ENCOUNTER — Ambulatory Visit (INDEPENDENT_AMBULATORY_CARE_PROVIDER_SITE_OTHER): Payer: Medicare HMO | Admitting: Internal Medicine

## 2020-09-14 ENCOUNTER — Other Ambulatory Visit: Payer: Self-pay

## 2020-09-14 ENCOUNTER — Encounter: Payer: Self-pay | Admitting: Internal Medicine

## 2020-09-14 VITALS — BP 130/72 | HR 71 | Ht 65.0 in | Wt 102.0 lb

## 2020-09-14 DIAGNOSIS — I442 Atrioventricular block, complete: Secondary | ICD-10-CM | POA: Diagnosis not present

## 2020-09-14 DIAGNOSIS — Z950 Presence of cardiac pacemaker: Secondary | ICD-10-CM

## 2020-09-14 NOTE — Patient Instructions (Signed)
Medication Instructions:    Your physician recommends that you continue on your current medications as directed. Please refer to the Current Medication list given to you today.  *If you need a refill on your cardiac medications before your next appointment, please call your pharmacy*   Lab Work: None ordered.  If you have labs (blood work) drawn today and your tests are completely normal, you will receive your results only by: [place] (if you have MyChart) OR A paper copy in the mail If you have any lab test that is abnormal or we need to change your treatment, we will call you to review the results.   Testing/Procedures: None ordered.    Follow-Up: At [facility], you and your health needs are our priority.  As part of our continuing mission to provide you with exceptional heart care, we have created designated Provider Care Teams.  These Care Teams include your primary Cardiologist (physician) and Advanced Practice Providers (APPs -  Physician Assistants and Nurse Practitioners) who all work together to provide you with the care you need, when you need it.  We recommend signing up for the patient portal called "MyChart".  Sign up information is provided on this After Visit Summary.  MyChart is used to connect with patients for Virtual Visits (Telemedicine).  Patients are able to view lab/test results, encounter notes, upcoming appointments, etc.  Non-urgent messages can be sent to your provider as well.   To learn more about what you can do with MyChart, go to [email or web address].    Your next appointment:   12 month(s)  The format for your next appointment:   Virtual Visit   Provider:   Virl Axe, MD

## 2020-09-14 NOTE — Progress Notes (Signed)
[facility] patient ID: [name], female   DOB: [birth year], 85 y.o.   MRN: [id number]      Patient Care Team: Leonard Downing, MD as PCP - General (Family Medicine) Deboraha Sprang, MD as PCP - Electrophysiology (Cardiology) Burnell Blanks, MD as PCP - Cardiology (Cardiology)   HPI  [name] is a 85 y.o. female Seen in followup for pacemaker implanted 7/20 for syncope and high grade heart block  Also with Severe AS and s/p TAVR 9/20   Much improved following pacing and TAVR no other syncope  Complains of shortness of breath and some edema.  Seen by structural heart and started on a diuretic with significant improvement.  No interval syncope.  No chest pain.  Nocturnal dyspnea.  2-3 pillow orthopnea.  Irregular palpitations   DATE TEST EF   She is here to be Past Medical History:  Diagnosis Date   Anxiety    Stable   ASCUS on Pap smear 11/15/09   Carotid artery disease (HCC)    1-39% RICA stenosis and 28-31% LICA stenosis on pre TAVR dopplers    Depression    Duodenal ulcer due to Helicobacter pylori 08/15/6158   Endometrial polyp 11/15/2009   Endometrioid adenocarcinoma    H/O varicella    Hemorrhoids    History of measles, mumps, or rubella    Hypercholesterolemia    Hypertension    Menopausal symptoms 06/13/10   Ovarian cyst 11/15/09   PMB (postmenopausal bleeding) 11/15/09   Presence of permanent cardiac pacemaker 10/20/2018   Vaginal bleeding     Past Surgical History:  Procedure Laterality Date   BIOPSY  06/19/2020   Procedure: BIOPSY;  Surgeon: Irving Copas., MD;  Location: [facility] ENDOSCOPY;  Service: Gastroenterology;;   BREAST SURGERY     Over twenty - five  years ago   CATARACT EXTRACTION W/ INTRAOCULAR LENS IMPLANT Bilateral    CERVICAL BIOPSY     ESOPHAGOGASTRODUODENOSCOPY (EGD) WITH PROPOFOL N/A 06/19/2020   Procedure: ESOPHAGOGASTRODUODENOSCOPY (EGD) WITH PROPOFOL;  Surgeon: Irving Copas., MD;  Location: [facility] ENDOSCOPY;   Service: Gastroenterology;  Laterality: N/A;   EYE SURGERY     INSERT / REPLACE / REMOVE PACEMAKER  10/20/2018   LAPAROSCOPIC ASSISTED VAGINAL HYSTERECTOMY  12/2009   BSO   MULTIPLE EXTRACTIONS WITH ALVEOLOPLASTY N/A 12/09/2018   Procedure: EXTRACTION OF TOOTH #'S 6,21-29 AND 32 WITH ALVEOLOPLASTY AND BILATERAL MANDIBULAR TORI REDUCTIONS;  Surgeon: Lenn Cal, DDS;  Location: [place];  Service: Oral Surgery;  Laterality: N/A;   PACEMAKER IMPLANT N/A 10/20/2018   Procedure: PACEMAKER IMPLANT;  Surgeon: Deboraha Sprang, MD;  Location: [place] CV LAB;  Service: Cardiovascular;  Laterality: N/A;   RIGHT/LEFT HEART CATH AND CORONARY ANGIOGRAPHY N/A 11/24/2018   Procedure: RIGHT/LEFT HEART CATH AND CORONARY ANGIOGRAPHY;  Surgeon: Burnell Blanks, MD;  Location: [place] CV LAB;  Service: Cardiovascular;  Laterality: N/A;   TEE WITHOUT CARDIOVERSION N/A 12/21/2018   Procedure: TRANSESOPHAGEAL ECHOCARDIOGRAM (TEE);  Surgeon: Burnell Blanks, MD;  Location: [place];  Service: Open Heart Surgery;  Laterality: N/A;    Current Meds  Medication Sig   acetaminophen (TYLENOL) 500 MG tablet Take 500 mg by mouth at bedtime.   albuterol (VENTOLIN HFA) 108 (90 Base) MCG/ACT inhaler Inhale 2 puffs into the lungs every 4 (four) hours as needed for wheezing or shortness of breath.   amLODipine (NORVASC) 10 MG tablet Take 10 mg by mouth daily.   aspirin 81 MG chewable  tablet Chew 1 tablet (81 mg total) by mouth daily.   carboxymethylcellulose (REFRESH PLUS) 0.5 % SOLN Place 1 drop into both eyes daily as needed (dry eyes).    Cholecalciferol (VITAMIN D-3) 125 MCG (5000 UT) TABS Take 5,000 Units by mouth daily.   citalopram (CELEXA) 40 MG tablet Take 20 mg by mouth daily.   furosemide (LASIX) 20 MG tablet Take 1 tablet (20 mg total) by mouth daily.   hydrOXYzine (VISTARIL) 50 MG capsule Take 1 capsule (50 mg total) by mouth 3 (three) times daily as needed for anxiety.   Omega-3 Fatty Acids  (FISH OIL) 1000 MG CAPS Take 1,000 mg by mouth daily.   pantoprazole (PROTONIX) 20 MG tablet Take 1 tablet (20 mg total) by mouth daily.   simvastatin (ZOCOR) 20 MG tablet Take 20 mg by mouth daily.   Tiotropium Bromide-Olodaterol (STIOLTO RESPIMAT) 2.5-2.5 MCG/ACT AERS Inhale 2 puffs into the lungs daily.   vitamin B-12 (CYANOCOBALAMIN) 500 MCG tablet Take 500 mcg by mouth daily.   vitamin E 180 MG (400 UNITS) capsule Take 400 Units by mouth daily.    No Known Allergies   Review of Systems negative except from HPI and PMH  Physical Exam: BP 130/72   Pulse 71   Ht 5' 5"  (1.651 m)   Wt 102 lb (46.3 kg)   SpO2 97%   BMI 16.97 kg/m  Well developed and well nourished in no acute distress HENT normal Neck supple with JVP-flat Clear Device pocket well healed; without hematoma or erythema.  There is no tethering  Regular rate and rhythm, no  gallop 2/6 murmur Abd-soft with active BS No Clubbing cyanosis  edema Skin-warm and dry A & Oriented  Grossly normal sensory and motor function   EKG: Sinus with P synchronous pacing at 71 Interval 24/16/45   Assessment and  Plan  Complete heart block  AS  S/p TAVR  Pacemaker [employer]    Congestive heart failure acute/chronic/diastolic  Ventricular tachycardia  Nonsustained  During TAVR procedure   Improved functional status since initiation of the diuretic.  We will continue her 20 mg daily.  Functional status may further be improved by short interval treatment delay which we have done from 200--160 i.e. translate, PR interval decreased to about 240--200 ms      I,Stephanie Williams,acting as a scribe for Virl Axe, MD.,have documented all relevant documentation on the behalf of Virl Axe, MD,as directed by  Virl Axe, MD while in the presence of Virl Axe, MD.  I, Virl Axe, MD, have reviewed all documentation for this visit. The documentation on 09/14/20 for the exam, diagnosis, procedures, and orders are all  accurate and complete.

## 2020-10-09 ENCOUNTER — Ambulatory Visit (HOSPITAL_COMMUNITY): Payer: Medicare HMO | Attending: Cardiovascular Disease

## 2020-10-09 ENCOUNTER — Other Ambulatory Visit: Payer: Self-pay

## 2020-10-09 DIAGNOSIS — Z952 Presence of prosthetic heart valve: Secondary | ICD-10-CM | POA: Diagnosis not present

## 2020-10-10 LAB — ECHOCARDIOGRAM COMPLETE
AV Mean grad: 4.5 mmHg
AV Peak grad: 8.6 mmHg
Ao pk vel: 1.47 m/s
Area-P 1/2: 2.24 cm2
P 1/2 time: 478 msec
S' Lateral: 2.6 cm

## 2020-10-15 ENCOUNTER — Other Ambulatory Visit (HOSPITAL_COMMUNITY)
Admission: RE | Admit: 2020-10-15 | Discharge: 2020-10-15 | Disposition: A | Discharge status: Home / Self Care | Payer: Medicare HMO | Source: Ambulatory Visit | Attending: Emergency Medicine | Admitting: Emergency Medicine

## 2020-10-15 DIAGNOSIS — Z20822 Contact with and (suspected) exposure to covid-19: Secondary | ICD-10-CM | POA: Insufficient documentation

## 2020-10-15 DIAGNOSIS — Z01812 Encounter for preprocedural laboratory examination: Secondary | ICD-10-CM | POA: Diagnosis present

## 2020-10-15 LAB — SARS CORONAVIRUS 2 (TAT 6-24 HRS): SARS Coronavirus 2: NEGATIVE

## 2020-10-18 ENCOUNTER — Other Ambulatory Visit: Payer: Self-pay

## 2020-10-18 ENCOUNTER — Ambulatory Visit (INDEPENDENT_AMBULATORY_CARE_PROVIDER_SITE_OTHER): Payer: Medicare HMO | Admitting: Emergency Medicine

## 2020-10-18 ENCOUNTER — Encounter: Payer: Self-pay | Admitting: Emergency Medicine

## 2020-10-18 DIAGNOSIS — Z720 Tobacco use: Secondary | ICD-10-CM | POA: Insufficient documentation

## 2020-10-18 DIAGNOSIS — R0600 Dyspnea, unspecified: Secondary | ICD-10-CM | POA: Diagnosis not present

## 2020-10-18 DIAGNOSIS — R0609 Other forms of dyspnea: Secondary | ICD-10-CM

## 2020-10-18 DIAGNOSIS — J449 Chronic obstructive pulmonary disease, unspecified: Secondary | ICD-10-CM | POA: Diagnosis not present

## 2020-10-18 LAB — PULMONARY FUNCTION TEST
DL/VA % pred: 67 %
DL/VA: 2.73 ml/min/mmHg/L
DLCO cor % pred: 61 %
DLCO cor: 11.88 ml/min/mmHg
DLCO unc % pred: 61 %
DLCO unc: 11.88 ml/min/mmHg
FEF 25-75 Post: 0.61 L/sec
FEF 25-75 Pre: 0.52 L/sec
FEF2575-%Change-Post: 17 %
FEF2575-%Pred-Post: 44 %
FEF2575-%Pred-Pre: 38 %
FEV1-%Change-Post: 4 %
FEV1-%Pred-Post: 63 %
FEV1-%Pred-Pre: 60 %
FEV1-Post: 1.24 L
FEV1-Pre: 1.19 L
FEV1FVC-%Change-Post: 0 %
FEV1FVC-%Pred-Pre: 74 %
FEV6-%Change-Post: 4 %
FEV6-%Pred-Post: 90 %
FEV6-%Pred-Pre: 86 %
FEV6-Post: 2.26 L
FEV6-Pre: 2.16 L
FEV6FVC-%Change-Post: 0 %
FEV6FVC-%Pred-Post: 105 %
FEV6FVC-%Pred-Pre: 105 %
FVC-%Change-Post: 3 %
FVC-%Pred-Post: 85 %
FVC-%Pred-Pre: 82 %
FVC-Post: 2.26 L
FVC-Pre: 2.17 L
Post FEV1/FVC ratio: 55 %
Post FEV6/FVC ratio: 100 %
Pre FEV1/FVC ratio: 55 %
Pre FEV6/FVC Ratio: 99 %
RV % pred: 185 %
RV: 4.6 L
TLC % pred: 124 %
TLC: 6.49 L

## 2020-10-18 MED ORDER — STIOLTO RESPIMAT 2.5-2.5 MCG/ACT IN AERS
2.0000 | INHALATION_SPRAY | Freq: Every day | RESPIRATORY_TRACT | 5 refills | Status: DC
Start: 2020-10-18 — End: 2020-11-10

## 2020-10-18 NOTE — Progress Notes (Signed)
Full PFT performed today. °

## 2020-10-18 NOTE — Patient Instructions (Addendum)
Congratulations on decreasing your smoking.  It is very important to work hard on stopping altogether.  We made a goal today that you would be off cigarettes completely in 6 months. We will continue Stiolto 2 puffs once daily. Keep albuterol available to use 2 puffs if needed for shortness of breath, chest tightness, wheezing. Follow with Dr Lamonte Sakai in 6 months or sooner if you have any problems

## 2020-10-18 NOTE — Progress Notes (Signed)
   Subjective:    Patient ID: [name], female    DOB: 12/09/1935, 85 y.o.   MRN: [id number]  HPI 85 year old smoker (60+ pack years) with a history of CAD, third degree AV block with pacer, hypertension, severe aortic stenosis post TAVR 11/2018 with associated moderate pulmonary hypertension, endometrial cancer, GI bleeding - recently started on PPI.  She is on scheduled diuretics, aspirin. She is referred for exertional shortness of breath. She has chest tightness with exertion, sputum that she needs to clear. Coughs frequently - thick white mucous. She has wheeze most days. She tires easily. Has to stop to rest after cooking or cleaning, shopping. She uses albuterol 2-3 days a week, helps breathing and mucous clearance.   ROV 10/18/20 --follow-up visit for 85 year old woman with history of tobacco use, left-sided heart disease, hypertension, TAVR and moderate PAH as outlined above.  She has multifactorial dyspnea, cough with sputum production all suspicious for contribution of COPD.  Admit her 09/05/2020 at which time we started empiric Stiolto to see if this would benefit her breathing.  She did not desaturate with ambulation at our initial visit.  She reports that the Stiolto was helpful - she is able to clear secretions better, leads to less SOB. Her functional capacity is about the same. She has albuterol, using about once a day.   Pulmonary function testing performed today and reviewed by me, show moderately severe obstruction without a bronchodilator response, hyperinflated volumes and a decreased diffusion capacity.    Review of Systems As per HPI     Objective:   Physical Exam Vitals:   10/18/20 1336  BP: 138/70  Pulse: 74  Temp: 98.1 F (36.7 C)  TempSrc: Oral  SpO2: 95%  Weight: 103 lb (46.7 kg)  Height: 5\' 5"  (1.651 m)    Gen: Pleasant, very thin, in no distress,  normal affect  ENT: No lesions,  mouth clear,  oropharynx clear, no postnasal drip  Neck: No JVD, no  stridor  Lungs: No use of accessory muscles, distant, no crackles or wheezing on normal respiration, she does wheeze on forced expiration  Cardiovascular: RRR, distant, 2/6 s M, , no peripheral edema  Musculoskeletal: No deformities, no cyanosis or clubbing  Neuro: alert, awake, non focal  Skin: Warm, no lesions or rash      Assessment & Plan:  DOE (dyspnea on exertion) Multifactorial with known left-sided heart disease.  Clearly COPD is a part of this.  We will work on treating.  Also discussed smoking cessation.  Tobacco use Discussed the importance of cessation with her today.  She is interested and motivated.  We set a goal for her to be off cigarettes altogether in 6 months.  She is working on tapering.  COPD (chronic obstructive pulmonary disease) ([place]) With moderately severe obstruction by pulmonary function testing, hyperinflation and a decreased diffusion.  She did benefit from [facility] and we will plan to continue it.  Could consider ICS at some point in the future if she begins to exacerbate frequently or if she has a significant mucus burden, bronchitis.  We will continue Stiolto 2 puffs once daily. Keep albuterol available to use 2 puffs if needed for shortness of breath, chest tightness, wheezing. Follow with Dr Lamonte Sakai in 6 months or sooner if you have any problems  Baltazar Apo, MD, PhD 10/18/2020, 1:52 PM [facility] [phone] or if no answer before 7:00PM call [phone] For any issues after 7:00PM please call eLink [phone]

## 2020-10-18 NOTE — Assessment & Plan Note (Signed)
Discussed the importance of cessation with her today.  She is interested and motivated.  We set a goal for her to be off cigarettes altogether in 6 months.  She is working on tapering.

## 2020-10-18 NOTE — Patient Instructions (Signed)
Full PFT performed today. °

## 2020-10-18 NOTE — Assessment & Plan Note (Signed)
Multifactorial with known left-sided heart disease.  Clearly COPD is a part of this.  We will work on treating.  Also discussed smoking cessation.

## 2020-10-18 NOTE — Addendum Note (Signed)
Addended by: Gavin Potters R on: 10/18/2020 02:09 PM   Modules accepted: Orders

## 2020-10-18 NOTE — Assessment & Plan Note (Signed)
With moderately severe obstruction by pulmonary function testing, hyperinflation and a decreased diffusion.  She did benefit from [facility] and we will plan to continue it.  Could consider ICS at some point in the future if she begins to exacerbate frequently or if she has a significant mucus burden, bronchitis.  We will continue Stiolto 2 puffs once daily. Keep albuterol available to use 2 puffs if needed for shortness of breath, chest tightness, wheezing. Follow with Dr Lamonte Sakai in 6 months or sooner if you have any problems

## 2020-10-23 ENCOUNTER — Ambulatory Visit (INDEPENDENT_AMBULATORY_CARE_PROVIDER_SITE_OTHER): Payer: Medicare HMO

## 2020-10-23 DIAGNOSIS — I442 Atrioventricular block, complete: Secondary | ICD-10-CM | POA: Diagnosis not present

## 2020-10-23 LAB — CUP PACEART REMOTE DEVICE CHECK
Battery Remaining Longevity: 121 mo
Battery Voltage: 3.01 V
Brady Statistic AP VP Percent: 0.14 %
Brady Statistic AP VS Percent: 0 %
Brady Statistic AS VP Percent: 99.83 %
Brady Statistic AS VS Percent: 0.03 %
Brady Statistic RA Percent Paced: 0.15 %
Brady Statistic RV Percent Paced: 99.97 %
Date Time Interrogation Session: 20220726065047
Implantable Lead Implant Date: 20200722
Implantable Lead Implant Date: 20200722
Implantable Lead Location: 753859
Implantable Lead Location: 753860
Implantable Lead Model: 1944
Implantable Lead Model: 1948
Implantable Pulse Generator Implant Date: 20200722
Lead Channel Impedance Value: 361 Ohm
Lead Channel Impedance Value: 399 Ohm
Lead Channel Impedance Value: 532 Ohm
Lead Channel Impedance Value: 646 Ohm
Lead Channel Pacing Threshold Amplitude: 0.5 V
Lead Channel Pacing Threshold Amplitude: 0.5 V
Lead Channel Pacing Threshold Pulse Width: 0.4 ms
Lead Channel Pacing Threshold Pulse Width: 0.4 ms
Lead Channel Sensing Intrinsic Amplitude: 2 mV
Lead Channel Sensing Intrinsic Amplitude: 2 mV
Lead Channel Sensing Intrinsic Amplitude: 9.75 mV
Lead Channel Sensing Intrinsic Amplitude: 9.75 mV
Lead Channel Setting Pacing Amplitude: 1.5 V
Lead Channel Setting Pacing Amplitude: 2.5 V
Lead Channel Setting Pacing Pulse Width: 0.4 ms
Lead Channel Setting Sensing Sensitivity: 2 mV

## 2020-10-26 ENCOUNTER — Other Ambulatory Visit: Payer: Self-pay

## 2020-10-26 ENCOUNTER — Ambulatory Visit (INDEPENDENT_AMBULATORY_CARE_PROVIDER_SITE_OTHER): Payer: Medicare HMO | Admitting: Cardiovascular Disease

## 2020-10-26 ENCOUNTER — Encounter: Payer: Self-pay | Admitting: Cardiovascular Disease

## 2020-10-26 VITALS — BP 140/50 | HR 74 | Ht 65.0 in | Wt 105.0 lb

## 2020-10-26 DIAGNOSIS — Z952 Presence of prosthetic heart valve: Secondary | ICD-10-CM | POA: Diagnosis not present

## 2020-10-26 DIAGNOSIS — R531 Weakness: Secondary | ICD-10-CM

## 2020-10-26 DIAGNOSIS — I2510 Atherosclerotic heart disease of native coronary artery without angina pectoris: Secondary | ICD-10-CM | POA: Diagnosis not present

## 2020-10-26 DIAGNOSIS — I350 Nonrheumatic aortic (valve) stenosis: Secondary | ICD-10-CM

## 2020-10-26 DIAGNOSIS — I442 Atrioventricular block, complete: Secondary | ICD-10-CM | POA: Diagnosis not present

## 2020-10-26 NOTE — Patient Instructions (Signed)
Medication Instructions:  Your physician recommends that you continue on your current medications as directed. Please refer to the Current Medication list given to you today.  *If you need a refill on your cardiac medications before your next appointment, please call your pharmacy*   Lab Work: TODAY: CBC If you have labs (blood work) drawn today and your tests are completely normal, you will receive your results only by: [place] (if you have MyChart) OR A paper copy in the mail If you have any lab test that is abnormal or we need to change your treatment, we will call you to review the results.   Testing/Procedures: NONE   Follow-Up: At [facility], you and your health needs are our priority.  As part of our continuing mission to provide you with exceptional heart care, we have created designated Provider Care Teams.  These Care Teams include your primary Cardiologist (physician) and Advanced Practice Providers (APPs -  Physician Assistants and Nurse Practitioners) who all work together to provide you with the care you need, when you need it.  We recommend signing up for the patient portal called "MyChart".  Sign up information is provided on this After Visit Summary.  MyChart is used to connect with patients for Virtual Visits (Telemedicine).  Patients are able to view lab/test results, encounter notes, upcoming appointments, etc.  Non-urgent messages can be sent to your provider as well.   To learn more about what you can do with MyChart, go to [email or web address].    Your next appointment:   1 year(s)  The format for your next appointment:   In Person  Provider:   You may see Lauree Chandler, MD or one of the following Advanced Practice Providers on your designated Care Team:   Melina Copa, PA-C Ermalinda Barrios, PA-C

## 2020-10-26 NOTE — Progress Notes (Signed)
[facility] Consult Note  Chief Complaint  Patient presents with   Follow-up    CAD     History of Present Illness: 85 yo female with history of ongoing tobacco abuse, anxiety, depression, endometrial cancer, hyperlipidemia, HTN, high grade AV block s/p permanent pacemaker placement and severe aortic stenosis now s/p TAVR September [id number], chronic diastolic CHF who is here today for cardiac follow up. She was [facility] to [facility] 10/20/18 with advanced heart block (2:1 AV block with intermittent complete heart block) and a permanent pacemaker was placed on 10/20/18. Echo 10/20/18 with LVEF greater than 65% with severe AS. Cardiac cath in 2020 with mild non-obstructive CAD. She underwent TAVR September 2020 with placement of an Edwards Sapien 3 Ultra THV via the subclavian approach. She had a Plavix reaction and was discharged on ASA. She was hospitalized 05/2020 for ABL anemia (Hgb 9-10 range) with melena 2/2 UGIB. EGD showed hiatal hernia, gastritis, and nonbleeding duodenal ulcer with + H Pylori. She was treated for this and cleared to resume her ASA. She was readmitted 4/6-07/06/20 with SOB and black stools. Her Hgb was stable so was not felt to require further procedures. GI recommended to hold antibiotics for H. Pylori with plan to revisit different regimen as outpatient. She was continued on PPI. D-dimer was elevated but CTA was negative for PE and LE venous duplex negative for DVT. Cardiology was consulted for possible contribution of CHF and mildly elevated troponin. Echo 07/06/20 EF 50-55%, apical hypokinesis, mild LVH, mildly enlarged RV, moderately elevated PASP (55.57m hg), dilated IVC, mild-moderate AI. Her apical hypokinesis was felt to be a reflection of RV pacing, not ischemic process. Troponins were low and flat 33-36 not felt to reflect ACS. She was treated with a dose of IV Lasix and recommended for PO Lasix x 4 days for possible component of fluid overload in the setting of IV fluid  administration for GI bleed.  She was seen by DMelina CopaPa-C in the office on 07/12/20 and she continued to complain of shortness of breath. This was felt to be multifactorial due to underlying COPD (no formal diagnosis), anemia and CHF. She was felt to be euvolemic at that time and further diuretics were not recommended. Follow up lab work showed Hg was actually in a normal range at 12.3. Given new moderate aortic insufficiency, her echo was discussed with the multidisciplinary valve team. Cardiac CT was recommended. This showed central AI with hypo-attenuation and leaflet thickening. Given that she was not a good anticoagulation candidate with recent GI bleed and leaflet thickening can come and go, it was felt best to continue observation with serial echos. Repeat echo 10/09/20 with LVEF=55-60%, mild MR, mld AI. She is now followed in the pulmonary office by Dr. BLamonte Sakaiand is being treated for severe COPD.   She is here today for follow up. The patient denies any chest pain, dyspnea, palpitations, lower extremity edema, orthopnea, PND, dizziness, near syncope or syncope. She has had some dark stools recently. She has some weakness.   Primary Care Physician: ELeonard Downing MD Primary Cardiologist: KCaryl ComesReferring Cardiologist: KCaryl Comes Past Medical History:  Diagnosis Date   Anxiety    Stable   ASCUS on Pap smear 11/15/09   Carotid artery disease ([place]    1-39% RICA stenosis and [id number] stenosis on pre TAVR dopplers    Depression    Duodenal ulcer due to Helicobacter pylori 30000000  Endometrial polyp 11/15/2009   Endometrioid adenocarcinoma  H/O varicella    Hemorrhoids    History of measles, mumps, or rubella    Hypercholesterolemia    Hypertension    Menopausal symptoms 06/13/10   Ovarian cyst 11/15/09   PMB (postmenopausal bleeding) 11/15/09   Presence of permanent cardiac pacemaker 10/20/2018   Vaginal bleeding     Past Surgical History:  Procedure Laterality Date    BIOPSY  06/19/2020   Procedure: BIOPSY;  Surgeon: Mansouraty, Telford Nab., MD;  Location: [place];  Service: Gastroenterology;;   BREAST SURGERY     Over twenty - five  years ago   CATARACT EXTRACTION W/ INTRAOCULAR LENS IMPLANT Bilateral    CERVICAL BIOPSY     ESOPHAGOGASTRODUODENOSCOPY (EGD) WITH PROPOFOL N/A 06/19/2020   Procedure: ESOPHAGOGASTRODUODENOSCOPY (EGD) WITH PROPOFOL;  Surgeon: Irving Copas., MD;  Location: [place];  Service: Gastroenterology;  Laterality: N/A;   EYE SURGERY     INSERT / REPLACE / REMOVE PACEMAKER  10/20/2018   LAPAROSCOPIC ASSISTED VAGINAL HYSTERECTOMY  12/2009   BSO   MULTIPLE EXTRACTIONS WITH ALVEOLOPLASTY N/A 12/09/2018   Procedure: EXTRACTION OF TOOTH #'S 6,21-29 AND 32 WITH ALVEOLOPLASTY AND BILATERAL MANDIBULAR TORI REDUCTIONS;  Surgeon: Lenn Cal, DDS;  Location: [place];  Service: Oral Surgery;  Laterality: N/A;   PACEMAKER IMPLANT N/A 10/20/2018   Procedure: PACEMAKER IMPLANT;  Surgeon: Deboraha Sprang, MD;  Location: [place] CV LAB;  Service: Cardiovascular;  Laterality: N/A;   RIGHT/LEFT HEART CATH AND CORONARY ANGIOGRAPHY N/A 11/24/2018   Procedure: RIGHT/LEFT HEART CATH AND CORONARY ANGIOGRAPHY;  Surgeon: Burnell Blanks, MD;  Location: [place] CV LAB;  Service: Cardiovascular;  Laterality: N/A;   TEE WITHOUT CARDIOVERSION N/A 12/21/2018   Procedure: TRANSESOPHAGEAL ECHOCARDIOGRAM (TEE);  Surgeon: Burnell Blanks, MD;  Location: [place];  Service: Open Heart Surgery;  Laterality: N/A;    Current Outpatient Medications  Medication Sig Dispense Refill   acetaminophen (TYLENOL) 500 MG tablet Take 500 mg by mouth at bedtime.     albuterol (VENTOLIN HFA) 108 (90 Base) MCG/ACT inhaler Inhale 2 puffs into the lungs every 4 (four) hours as needed for wheezing or shortness of breath. 8 g 1   amLODipine (NORVASC) 10 MG tablet Take 10 mg by mouth daily.     aspirin 81 MG chewable tablet Chew 1 tablet (81 mg total)  by mouth daily.     carboxymethylcellulose (REFRESH PLUS) 0.5 % SOLN Place 1 drop into both eyes daily as needed (dry eyes).      Cholecalciferol (VITAMIN D-3) 125 MCG (5000 UT) TABS Take 5,000 Units by mouth daily.     citalopram (CELEXA) 40 MG tablet Take 20 mg by mouth daily.     furosemide (LASIX) 20 MG tablet Take 1 tablet (20 mg total) by mouth daily. 90 tablet 3   hydrOXYzine (VISTARIL) 50 MG capsule Take 1 capsule (50 mg total) by mouth 3 (three) times daily as needed for anxiety.     Omega-3 Fatty Acids (FISH OIL) 1000 MG CAPS Take 1,000 mg by mouth daily.     pantoprazole (PROTONIX) 20 MG tablet Take 1 tablet (20 mg total) by mouth daily. 90 tablet 3   simvastatin (ZOCOR) 20 MG tablet Take 20 mg by mouth daily.     Tiotropium Bromide-Olodaterol (STIOLTO RESPIMAT) 2.5-2.5 MCG/ACT AERS Inhale 2 puffs into the lungs daily. 4 g 5   vitamin B-12 (CYANOCOBALAMIN) 500 MCG tablet Take 500 mcg by mouth daily.     vitamin E 180 MG (400 UNITS)  capsule Take 400 Units by mouth daily.     No current facility-administered medications for this visit.    No Known Allergies  Social History   Socioeconomic History   Marital status: Legally Separated    Spouse name: Not on file   Number of children: Not on file   Years of education: Not on file   Highest education level: Not on file  Occupational History   Occupation: Retired-sales rep  Tobacco Use   Smoking status: Every Day    Packs/day: 0.50    Years: 55.00    Pack years: 27.50    Types: Cigarettes   Smokeless tobacco: Never   Tobacco comments:    5-6 cigarettes smoke daily [facility] 10/18/20  Vaping Use   Vaping Use: Never used  Substance and Sexual Activity   Alcohol use: No   Drug use: Not Currently   Sexual activity: Not on file  Other Topics Concern   Not on file  Social History Narrative   Not on file   Social Determinants of Health   Financial Resource Strain: Not on file  Food Insecurity: Not on file  Transportation  Needs: Not on file  Physical Activity: Not on file  Stress: Not on file  Social Connections: Not on file  Intimate Partner Violence: Not on file    Family History  Problem Relation Age of Onset   Lung cancer Brother    Heart disease Brother    Heart failure Mother    Parkinson's disease Father     Review of Systems:  As stated in the HPI and otherwise negative.   BP (!) 140/50   Pulse 74   Ht '5\' 5"'$  (1.651 m)   Wt 105 lb (47.6 kg)   SpO2 95%   BMI 17.47 kg/m   Physical Examination: General: Well developed, well nourished, NAD  HEENT: OP clear, mucus membranes moist  SKIN: warm, dry. No rashes. Neuro: No focal deficits  Musculoskeletal: Muscle strength 5/5 all ext  Psychiatric: Mood and affect normal  Neck: No JVD, no carotid bruits, no thyromegaly, no lymphadenopathy.  Lungs:Clear bilaterally, no wheezes, rhonci, crackles Cardiovascular: Regular rate and rhythm. No murmurs, gallops or rubs. Abdomen:Soft. Bowel sounds present. Non-tender.  Extremities: No lower extremity edema. Pulses are 2 + in the bilateral DP/PT.  EKG:  EKG is ordered today. The ekg ordered today demonstrates V paced rhythm  Echo July 2022:  1. Left ventricular ejection fraction, by estimation, is 55 to 60%. The  left ventricle has normal function. The left ventricle has no regional  wall motion abnormalities. Left ventricular diastolic parameters are  consistent with Grade I diastolic  dysfunction (impaired relaxation). Elevated left atrial pressure.   2. Right ventricular systolic function is normal. The right ventricular  size is normal. There is mildly elevated pulmonary artery systolic  pressure. The estimated right ventricular systolic pressure is 99991111 mmHg.   3. The mitral valve is degenerative. Mild mitral valve regurgitation.  Moderate mitral annular calcification.   4. The aortic valve has been repaired/replaced. Aortic valve  regurgitation is mild. There is a 23 mm Sapien prosthetic  (TAVR) valve  present in the aortic position. Aortic valve mean gradient measures 4.5  mmHg. Aortic valve Vmax measures 1.47 m/s.   5. The inferior vena cava is dilated in size with >50% respiratory  variability, suggesting right atrial pressure of 8 mmHg.    Recent Labs: 06/18/2020: ALT 11 06/20/2020: Magnesium 1.8 07/05/2020: TSH 2.249 08/01/2020: Hemoglobin 12.7; NT-Pro BNP  1,574; Platelets 186 08/13/2020: BUN 18; Creatinine, Ser 0.85; Potassium 4.1; Sodium 139   Wt Readings from Last 3 Encounters:  10/26/20 105 lb (47.6 kg)  10/18/20 103 lb (46.7 kg)  09/14/20 102 lb (46.3 kg)     Other studies Reviewed: Additional studies/ records that were reviewed today include: echo images, hospital notes.  Review of the above records demonstrates: severe AS   Assessment and Plan:   1. Severe Aortic Valve Stenosis s/p TAVR: Valve working well by echo 10/09/20 with mild AI. Continue ASA, Continue SBE prophylaxis.   2. CAD without angina: Mild CAD by cath in 2020. Continue ASA and statin  3. Complete AV block: Pacemaker followed by Dr. Caryl Comes  4. Weakness/Dark Stools: Will check a CBC today at her request.    Labs/ tests ordered today include:   Orders Placed This Encounter  Procedures   CBC      Disposition:   F/U with me in one year   Signed, Lauree Chandler, MD 10/26/2020 [address], [place], [place]  [address] Phone: [phone]; Fax: [phone]

## 2020-10-27 LAB — CBC
Hematocrit: 36.9 % (ref 34.0–46.6)
Hemoglobin: 11.9 g/dL (ref 11.1–15.9)
MCH: 27.2 pg (ref 26.6–33.0)
MCHC: 32.2 g/dL (ref 31.5–35.7)
MCV: 84 fL (ref 79–97)
Platelets: 160 10*3/uL (ref 150–450)
RBC: 4.38 x10E6/uL (ref 3.77–5.28)
RDW: 16.8 % — ABNORMAL HIGH (ref 11.7–15.4)
WBC: 5.2 10*3/uL (ref 3.4–10.8)

## 2020-10-29 ENCOUNTER — Telehealth: Payer: Self-pay | Admitting: Internal Medicine

## 2020-10-29 DIAGNOSIS — R195 Other fecal abnormalities: Secondary | ICD-10-CM

## 2020-10-29 NOTE — Telephone Encounter (Signed)
Inbound call from patient. States she is experiencing dark stools. She does not know whether to increase medication Protonix or to schedule an appt. Best contact number [phone]

## 2020-10-29 NOTE — Telephone Encounter (Signed)
Patient reports dark stools x 1 week.  Denies bismuth use, iron, etc.  She had CBC last week at Cardiology and Hgb down to 11.1.  She is asking if you want her to increase her pantoprazole?  Please advise

## 2020-10-30 ENCOUNTER — Telehealth: Payer: Self-pay | Admitting: Emergency Medicine

## 2020-10-30 ENCOUNTER — Other Ambulatory Visit (INDEPENDENT_AMBULATORY_CARE_PROVIDER_SITE_OTHER): Payer: Medicare HMO

## 2020-10-30 DIAGNOSIS — R195 Other fecal abnormalities: Secondary | ICD-10-CM

## 2020-10-30 LAB — CBC
HCT: 38 % (ref 36.0–46.0)
Hemoglobin: 12.7 g/dL (ref 12.0–15.0)
MCHC: 33.4 g/dL (ref 30.0–36.0)
MCV: 85.3 fl (ref 78.0–100.0)
Platelets: 201 10*3/uL (ref 150.0–400.0)
RBC: 4.46 Mil/uL (ref 3.87–5.11)
RDW: 19.1 % — ABNORMAL HIGH (ref 11.5–15.5)
WBC: 5.4 10*3/uL (ref 4.0–10.5)

## 2020-10-30 NOTE — Telephone Encounter (Signed)
repeat CBC today  Put on for EGD 8/4 830 - had good check up visit cardiology last week so I think direct ok

## 2020-10-30 NOTE — Addendum Note (Signed)
Addended by: Marlon Pel on: 10/30/2020 03:27 PM   Modules accepted: Orders

## 2020-10-30 NOTE — Telephone Encounter (Signed)
Patient notified  She will try and come today for labs She is scheduled for EGD on 11/01/20 8:30. She understands to arrive at 7:30 and be NPO after midnight.

## 2020-10-31 NOTE — Telephone Encounter (Signed)
I called and spoke with patient regarding message. Patient stated she went and tried to get Stiolto inhaler at pharmacy yesterday and they said it was not covered. Patient stated that pharmacy sent something in but I went downstairs and checked up and Dr. Lamonte Sakai box and did not see anything so I called and spoke with pharmacy regarding med and they stated the insurance is wanting anoro. I will send this to Dr. Lamonte Sakai for recs. I did place 2 samples of Stiolto upfront for patient. Patient verbalized understanding and will pick up tomm. I will send this to Dr. Lamonte Sakai for recs.  Dr. Lamonte Sakai, please advise on Anoro. Thanks!

## 2020-11-01 ENCOUNTER — Other Ambulatory Visit: Payer: Self-pay

## 2020-11-01 ENCOUNTER — Ambulatory Visit (AMBULATORY_SURGERY_CENTER): Payer: Medicare HMO | Admitting: Internal Medicine

## 2020-11-01 ENCOUNTER — Encounter: Payer: Self-pay | Admitting: Internal Medicine

## 2020-11-01 VITALS — BP 113/51 | HR 70 | Temp 97.8°F | Resp 20 | Ht 65.0 in | Wt 105.0 lb

## 2020-11-01 DIAGNOSIS — K269 Duodenal ulcer, unspecified as acute or chronic, without hemorrhage or perforation: Secondary | ICD-10-CM

## 2020-11-01 DIAGNOSIS — Z8719 Personal history of other diseases of the digestive system: Secondary | ICD-10-CM

## 2020-11-01 DIAGNOSIS — K262 Acute duodenal ulcer with both hemorrhage and perforation: Secondary | ICD-10-CM

## 2020-11-01 MED ORDER — SODIUM CHLORIDE 0.9 % IV SOLN: 500.0000 mL | INTRAVENOUS | Status: DC

## 2020-11-01 NOTE — Telephone Encounter (Signed)
OK to change the order to Anoro to see if the patient benefits, tolerates.

## 2020-11-01 NOTE — Progress Notes (Signed)
VS-CW 

## 2020-11-01 NOTE — Progress Notes (Signed)
To PACU, VSS. Report to Rn.tb 

## 2020-11-01 NOTE — Op Note (Signed)
[facility] Patient Name: [name] Procedure Date: 11/01/2020 8:19 AM MRN: [id number] Endoscopist: Gatha Mayer , MD Age: 85 Referring MD:  Date of Birth: June 23, 1935 Gender: Female Account #: [id number] Procedure:                Upper GI endoscopy Indications:              Acute duodenal ulcer with hemorrhage, Follow-up of                            acute duodenal ulcer with hemorrhage Medicines:                Propofol per Anesthesia, Monitored Anesthesia Care Procedure:                Pre-Anesthesia Assessment:                           - Prior to the procedure, a History and Physical                            was performed, and patient medications and                            allergies were reviewed. The patient's tolerance of                            previous anesthesia was also reviewed. The risks                            and benefits of the procedure and the sedation                            options and risks were discussed with the patient.                            All questions were answered, and informed consent                            was obtained. Prior Anticoagulants: The patient has                            taken no previous anticoagulant or antiplatelet                            agents. ASA Grade Assessment: III - A patient with                            severe systemic disease. After reviewing the risks                            and benefits, the patient was deemed in                            satisfactory condition to undergo the procedure.  After obtaining informed consent, the endoscope was                            passed under direct vision. Throughout the                            procedure, the patient's blood pressure, pulse, and                            oxygen saturations were monitored continuously. The                            GIF [id number] was introduced through the                             mouth, and advanced to the second part of duodenum.                            The upper GI endoscopy was accomplished without                            difficulty. The patient tolerated the procedure                            well. Scope In: Scope Out: Findings:                 The Z-line was irregular and was found 38 cm from                            the incisors.                           The entire examined stomach was normal.                           The examined duodenum was normal.                           The cardia and gastric fundus were normal on                            retroflexion. Complications:            No immediate complications. Estimated Blood Loss:     Estimated blood loss: none. Impression:               - Z-line irregular, 38 cm from the incisors.                           - Normal stomach.                           - Normal examined duodenum.                           - No specimens collected. Recommendation:           -  Patient has a contact number available for                            emergencies. The signs and symptoms of potential                            delayed complications were discussed with the                            patient. Return to normal activities tomorrow.                            Written discharge instructions were provided to the                            patient.                           - Resume previous diet.                           - Continue present medications.                           - Stay on low dose PPI given prior duodenal ulcer                           Hgb was NL and this is normal so do not think dark                            stools were melena                           See me prn Gatha Mayer, MD 11/01/2020 8:44:01 AM This report has been signed electronically.

## 2020-11-01 NOTE — Patient Instructions (Addendum)
Everything looks good.  I am not sure why stools were dark but do not think you have had bleeding.  I appreciate the opportunity to care for you. Gatha Mayer, MD, [facility]  Thank you for letting us take care of your healthcare needs today. Stay on you PPI given prior to you Duodenal Ulcer.   YOU HAD AN ENDOSCOPIC PROCEDURE TODAY AT [facility]:   Refer to the procedure report that was given to you for any specific questions about what was found during the examination.  If the procedure report does not answer your questions, please call your gastroenterologist to clarify.  If you requested that your care partner not be given the details of your procedure findings, then the procedure report has been included in a sealed envelope for you to review at your convenience later.  YOU SHOULD EXPECT: Some feelings of bloating in the abdomen. Passage of more gas than usual.  Walking can help get rid of the air that was put into your GI tract during the procedure and reduce the bloating. If you had a lower endoscopy (such as a colonoscopy or flexible sigmoidoscopy) you may notice spotting of blood in your stool or on the toilet paper. If you underwent a bowel prep for your procedure, you may not have a normal bowel movement for a few days.  Please Note:  You might notice some irritation and congestion in your nose or some drainage.  This is from the oxygen used during your procedure.  There is no need for concern and it should clear up in a day or so.  SYMPTOMS TO REPORT IMMEDIATELY:   Following upper endoscopy (EGD)  Vomiting of blood or coffee ground material  New chest pain or pain under the shoulder blades  Painful or persistently difficult swallowing  New shortness of breath  Fever of 100F or higher  Black, tarry-looking stools  For urgent or emergent issues, a gastroenterologist can be reached at any hour by calling [phone]. Do not use MyChart messaging for urgent  concerns.    DIET:  We do recommend a small meal at first, but then you may proceed to your regular diet.  Drink plenty of fluids but you should avoid alcoholic beverages for 24 hours.  ACTIVITY:  You should plan to take it easy for the rest of today and you should NOT DRIVE or use heavy machinery until tomorrow (because of the sedation medicines used during the test).    FOLLOW UP: Our staff will call the number listed on your records 48-72 hours following your procedure to check on you and address any questions or concerns that you may have regarding the information given to you following your procedure. If we do not reach you, we will leave a message.  We will attempt to reach you two times.  During this call, we will ask if you have developed any symptoms of COVID 19. If you develop any symptoms (ie: fever, flu-like symptoms, shortness of breath, cough etc.) before then, please call [phone].  If you test positive for Covid 19 in the 2 weeks post procedure, please call and report this information to [place].    If any biopsies were taken you will be contacted by phone or by letter within the next 1-3 weeks.  Please call us at [phone] if you have not heard about the biopsies in 3 weeks.    SIGNATURES/CONFIDENTIALITY: You and/or your care partner have signed paperwork which will be  entered into your electronic medical record.  These signatures attest to the fact that that the information above on your After Visit Summary has been reviewed and is understood.  Full responsibility of the confidentiality of this discharge information lies with you and/or your care-partner.

## 2020-11-01 NOTE — Telephone Encounter (Signed)
Called patient but she did not answer. Left message for her to call us back.  

## 2020-11-05 ENCOUNTER — Telehealth: Payer: Self-pay

## 2020-11-05 ENCOUNTER — Telehealth: Payer: Self-pay | Admitting: *Deleted

## 2020-11-05 NOTE — Telephone Encounter (Signed)
Left message on follow up call. 

## 2020-11-05 NOTE — Telephone Encounter (Signed)
No answer for post procedure call back. Left VM. 

## 2020-11-06 ENCOUNTER — Other Ambulatory Visit: Payer: Self-pay | Admitting: Emergency Medicine

## 2020-11-06 MED ORDER — STIOLTO RESPIMAT 2.5-2.5 MCG/ACT IN AERS: 2.0000 | INHALATION_SPRAY | Freq: Every day | RESPIRATORY_TRACT | 0 refills | Status: DC

## 2020-11-06 NOTE — Telephone Encounter (Signed)
Lmtcb for pt.  

## 2020-11-10 MED ORDER — ANORO ELLIPTA 62.5-25 MCG/INH IN AEPB: 1.0000 | INHALATION_SPRAY | Freq: Every day | RESPIRATORY_TRACT | 6 refills | Status: DC

## 2020-11-10 NOTE — Telephone Encounter (Signed)
I have sent the Anoro to the pharmacy and made a note to have the pharmacy remind the pt about the change due to her insurance.

## 2020-11-19 ENCOUNTER — Encounter (INDEPENDENT_AMBULATORY_CARE_PROVIDER_SITE_OTHER): Payer: Medicare HMO

## 2020-11-19 DIAGNOSIS — I442 Atrioventricular block, complete: Secondary | ICD-10-CM | POA: Diagnosis not present

## 2020-11-19 NOTE — Progress Notes (Signed)
Remote pacemaker transmission.   

## 2021-01-22 ENCOUNTER — Ambulatory Visit (INDEPENDENT_AMBULATORY_CARE_PROVIDER_SITE_OTHER): Payer: Medicare HMO

## 2021-01-22 DIAGNOSIS — I442 Atrioventricular block, complete: Secondary | ICD-10-CM | POA: Diagnosis not present

## 2021-01-22 LAB — CUP PACEART REMOTE DEVICE CHECK
Battery Remaining Longevity: 119 mo
Battery Voltage: 3.01 V
Brady Statistic AP VP Percent: 0.42 %
Brady Statistic AP VS Percent: 0 %
Brady Statistic AS VP Percent: 99.5 %
Brady Statistic AS VS Percent: 0.09 %
Brady Statistic RA Percent Paced: 0.41 %
Brady Statistic RV Percent Paced: 99.91 %
Date Time Interrogation Session: 20221025022719
Implantable Lead Implant Date: 20200722
Implantable Lead Implant Date: 20200722
Implantable Lead Location: 753859
Implantable Lead Location: 753860
Implantable Lead Model: 1944
Implantable Lead Model: 1948
Implantable Pulse Generator Implant Date: 20200722
Lead Channel Impedance Value: 380 Ohm
Lead Channel Impedance Value: 418 Ohm
Lead Channel Impedance Value: 551 Ohm
Lead Channel Impedance Value: 684 Ohm
Lead Channel Pacing Threshold Amplitude: 0.5 V
Lead Channel Pacing Threshold Amplitude: 0.5 V
Lead Channel Pacing Threshold Pulse Width: 0.4 ms
Lead Channel Pacing Threshold Pulse Width: 0.4 ms
Lead Channel Sensing Intrinsic Amplitude: 3.75 mV
Lead Channel Sensing Intrinsic Amplitude: 3.75 mV
Lead Channel Sensing Intrinsic Amplitude: 9.75 mV
Lead Channel Sensing Intrinsic Amplitude: 9.75 mV
Lead Channel Setting Pacing Amplitude: 1.5 V
Lead Channel Setting Pacing Amplitude: 2.5 V
Lead Channel Setting Pacing Pulse Width: 0.4 ms
Lead Channel Setting Sensing Sensitivity: 2 mV

## 2021-01-30 NOTE — Progress Notes (Signed)
Remote pacemaker transmission.   

## 2021-03-18 ENCOUNTER — Other Ambulatory Visit: Payer: Self-pay | Admitting: Internal Medicine

## 2021-04-23 ENCOUNTER — Ambulatory Visit (INDEPENDENT_AMBULATORY_CARE_PROVIDER_SITE_OTHER): Payer: Medicare HMO

## 2021-04-23 DIAGNOSIS — I442 Atrioventricular block, complete: Secondary | ICD-10-CM | POA: Diagnosis not present

## 2021-04-23 LAB — CUP PACEART REMOTE DEVICE CHECK
Battery Remaining Longevity: 118 mo
Battery Voltage: 3 V
Brady Statistic AP VP Percent: 0.32 %
Brady Statistic AP VS Percent: 0 %
Brady Statistic AS VP Percent: 99.66 %
Brady Statistic AS VS Percent: 0.02 %
Brady Statistic RA Percent Paced: 0.32 %
Brady Statistic RV Percent Paced: 99.98 %
Date Time Interrogation Session: 20230124050528
Implantable Lead Implant Date: 20200722
Implantable Lead Implant Date: 20200722
Implantable Lead Location: 753859
Implantable Lead Location: 753860
Implantable Lead Model: 1944
Implantable Lead Model: 1948
Implantable Pulse Generator Implant Date: 20200722
Lead Channel Impedance Value: 399 Ohm
Lead Channel Impedance Value: 437 Ohm
Lead Channel Impedance Value: 608 Ohm
Lead Channel Impedance Value: 722 Ohm
Lead Channel Pacing Threshold Amplitude: 0.5 V
Lead Channel Pacing Threshold Amplitude: 0.5 V
Lead Channel Pacing Threshold Pulse Width: 0.4 ms
Lead Channel Pacing Threshold Pulse Width: 0.4 ms
Lead Channel Sensing Intrinsic Amplitude: 3.875 mV
Lead Channel Sensing Intrinsic Amplitude: 3.875 mV
Lead Channel Sensing Intrinsic Amplitude: 9.75 mV
Lead Channel Sensing Intrinsic Amplitude: 9.75 mV
Lead Channel Setting Pacing Amplitude: 1.5 V
Lead Channel Setting Pacing Amplitude: 2.5 V
Lead Channel Setting Pacing Pulse Width: 0.4 ms
Lead Channel Setting Sensing Sensitivity: 2 mV

## 2021-05-03 NOTE — Progress Notes (Signed)
Remote pacemaker transmission.   

## 2021-06-16 ENCOUNTER — Other Ambulatory Visit: Payer: Self-pay | Admitting: Internal Medicine

## 2021-07-14 ENCOUNTER — Other Ambulatory Visit: Payer: Self-pay | Admitting: Physician Assistant

## 2021-07-17 ENCOUNTER — Other Ambulatory Visit: Payer: Self-pay | Admitting: Emergency Medicine

## 2021-07-23 ENCOUNTER — Ambulatory Visit (INDEPENDENT_AMBULATORY_CARE_PROVIDER_SITE_OTHER): Payer: Medicare HMO

## 2021-07-23 DIAGNOSIS — I442 Atrioventricular block, complete: Secondary | ICD-10-CM | POA: Diagnosis not present

## 2021-07-23 LAB — CUP PACEART REMOTE DEVICE CHECK
Battery Remaining Longevity: 113 mo
Battery Voltage: 3 V
Brady Statistic AP VP Percent: 0.9 %
Brady Statistic AP VS Percent: 0 %
Brady Statistic AS VP Percent: 98.76 %
Brady Statistic AS VS Percent: 0.35 %
Brady Statistic RA Percent Paced: 0.9 %
Brady Statistic RV Percent Paced: 99.65 %
Date Time Interrogation Session: 20230425012605
Implantable Lead Implant Date: 20200722
Implantable Lead Implant Date: 20200722
Implantable Lead Location: 753859
Implantable Lead Location: 753860
Implantable Lead Model: 1944
Implantable Lead Model: 1948
Implantable Pulse Generator Implant Date: 20200722
Lead Channel Impedance Value: 380 Ohm
Lead Channel Impedance Value: 418 Ohm
Lead Channel Impedance Value: 532 Ohm
Lead Channel Impedance Value: 665 Ohm
Lead Channel Pacing Threshold Amplitude: 0.5 V
Lead Channel Pacing Threshold Amplitude: 0.5 V
Lead Channel Pacing Threshold Pulse Width: 0.4 ms
Lead Channel Pacing Threshold Pulse Width: 0.4 ms
Lead Channel Sensing Intrinsic Amplitude: 3.625 mV
Lead Channel Sensing Intrinsic Amplitude: 3.625 mV
Lead Channel Sensing Intrinsic Amplitude: 5 mV
Lead Channel Sensing Intrinsic Amplitude: 5 mV
Lead Channel Setting Pacing Amplitude: 1.5 V
Lead Channel Setting Pacing Amplitude: 2.5 V
Lead Channel Setting Pacing Pulse Width: 0.4 ms
Lead Channel Setting Sensing Sensitivity: 2 mV

## 2021-07-24 ENCOUNTER — Ambulatory Visit (HOSPITAL_COMMUNITY)
Admission: RE | Admit: 2021-07-24 | Discharge: 2021-07-24 | Disposition: A | Discharge status: Home / Self Care | Payer: Medicare HMO | Source: Ambulatory Visit | Attending: Cardiovascular Disease | Admitting: Cardiovascular Disease

## 2021-07-24 DIAGNOSIS — I6523 Occlusion and stenosis of bilateral carotid arteries: Secondary | ICD-10-CM | POA: Diagnosis present

## 2021-07-26 ENCOUNTER — Ambulatory Visit
Admission: EM | Admit: 2021-07-26 | Discharge: 2021-07-26 | Disposition: A | Discharge status: Home / Self Care | Payer: Medicare HMO | Attending: Urgent Care | Admitting: Urgent Care

## 2021-07-26 DIAGNOSIS — R519 Headache, unspecified: Secondary | ICD-10-CM | POA: Diagnosis not present

## 2021-07-26 DIAGNOSIS — S0083XA Contusion of other part of head, initial encounter: Secondary | ICD-10-CM

## 2021-07-26 MED ORDER — ACETAMINOPHEN 325 MG PO TABS: 650.0000 mg | ORAL_TABLET | Freq: Four times a day (QID) | ORAL | 0 refills | Status: DC | PRN

## 2021-07-26 NOTE — ED Triage Notes (Signed)
Pt c/o falling on Wednesday and either hitting a table or rocking chair on her way to landing on either her face or side.  ? ?Denies LOC. Denies headache but states "I feel heavy." Denies change in vision, dizziness.  ? ?S has not had any other falls in 2023. Lives in a single story home without stairs. Has a neighbor that checks on her.  ? ?States did not take her aspirin on wednesdsay but did take her aspirin yesterday. Clear periorbital contusions she states did not appear until today.  ?

## 2021-07-26 NOTE — ED Provider Notes (Signed)
?[facility] ? ? ?MRN: [id number] DOB: 06/17/1935 ? ?Subjective:  ? ?[name] is a 86 y.o. female presenting for suffering a fall 3 days ago.  Patient accidentally tripped over her feet and ended up falling forward making impact against a rocking chair.  States that she felt immediate pain over the forehead area and had subsequent swelling.  Over the next 2 days she had a lot of bruising and swelling spread out across her forehead.  The bruising has spread into her eyes.  Denies loss consciousness, confusion, dizziness, nausea, vomiting, vision changes, headache.  Patient will make sure that she got evaluated.  She does take aspirin daily.  No history of clotting disorders. ? ?No current facility-administered medications for this encounter. ? ?Current Outpatient Medications:  ?  acetaminophen (TYLENOL) 500 MG tablet, Take 500 mg by mouth at bedtime., Disp: , Rfl:  ?  albuterol (VENTOLIN HFA) 108 (90 Base) MCG/ACT inhaler, Inhale 2 puffs into the lungs every 4 (four) hours as needed for wheezing or shortness of breath., Disp: 8 g, Rfl: 1 ?  amLODipine (NORVASC) 10 MG tablet, Take 1 tablet by mouth once daily, Disp: 90 tablet, Rfl: 1 ?  ANORO ELLIPTA 62.5-25 MCG/ACT AEPB, Inhale 1 puff by mouth once daily, Disp: 60 each, Rfl: 0 ?  aspirin 81 MG chewable tablet, Chew 1 tablet (81 mg total) by mouth daily., Disp:  , Rfl:  ?  carboxymethylcellulose (REFRESH PLUS) 0.5 % SOLN, Place 1 drop into both eyes daily as needed (dry eyes).  (Patient not taking: Reported on 11/01/2020), Disp: , Rfl:  ?  Cholecalciferol (VITAMIN D-3) 125 MCG (5000 UT) TABS, Take 5,000 Units by mouth daily., Disp: , Rfl:  ?  citalopram (CELEXA) 40 MG tablet, Take 20 mg by mouth daily., Disp: , Rfl:  ?  furosemide (LASIX) 20 MG tablet, Take 1 tablet by mouth once daily, Disp: 90 tablet, Rfl: 0 ?  hydrOXYzine (VISTARIL) 50 MG capsule, Take 1 capsule (50 mg total) by mouth 3 (three) times daily as needed for anxiety., Disp: , Rfl:  ?   Omega-3 Fatty Acids (FISH OIL) 1000 MG CAPS, Take 1,000 mg by mouth daily., Disp: , Rfl:  ?  pantoprazole (PROTONIX) 20 MG tablet, Take 1 tablet by mouth once daily, Disp: 90 tablet, Rfl: 0 ?  simvastatin (ZOCOR) 20 MG tablet, Take 20 mg by mouth daily., Disp: , Rfl:  ?  Tiotropium Bromide-Olodaterol (STIOLTO RESPIMAT) 2.5-2.5 MCG/ACT AERS, Inhale 2 puffs into the lungs daily., Disp: 4 g, Rfl: 0 ?  vitamin B-12 (CYANOCOBALAMIN) 500 MCG tablet, Take 500 mcg by mouth daily., Disp: , Rfl:  ?  vitamin E 180 MG (400 UNITS) capsule, Take 400 Units by mouth daily., Disp: , Rfl:   ? ?No Known Allergies ? ?Past Medical History:  ?Diagnosis Date  ? Anxiety   ? Stable  ? ASCUS on Pap smear 11/15/2009  ? Carotid artery disease ([place])   ? 1-39% RICA stenosis and 12-75% LICA stenosis on pre TAVR dopplers   ? Cataract   ? COPD (chronic obstructive pulmonary disease) ([place])   ? Depression   ? Duodenal ulcer due to Helicobacter pylori 17/00/1749  ? Endometrial polyp 11/15/2009  ? Endometrioid adenocarcinoma   ? H/O varicella   ? Heart murmur   ? Hemorrhoids   ? History of measles, mumps, or rubella   ? Hypercholesterolemia   ? Hypertension   ? Menopausal symptoms 06/13/2010  ? Ovarian cyst 11/15/2009  ? PMB (postmenopausal  bleeding) 11/15/2009  ? Presence of permanent cardiac pacemaker 10/20/2018  ? Vaginal bleeding   ?  ? ?Past Surgical History:  ?Procedure Laterality Date  ? BIOPSY  06/19/2020  ? Procedure: BIOPSY;  Surgeon: Irving Copas., MD;  Location: [facility];  Service: Gastroenterology;;  ? BREAST SURGERY    ? Over twenty - five  years ago  ? CATARACT EXTRACTION W/ INTRAOCULAR LENS IMPLANT Bilateral   ? CERVICAL BIOPSY    ? ESOPHAGOGASTRODUODENOSCOPY (EGD) WITH PROPOFOL N/A 06/19/2020  ? Procedure: ESOPHAGOGASTRODUODENOSCOPY (EGD) WITH PROPOFOL;  Surgeon: Rush Landmark Telford Nab., MD;  Location: [facility];  Service: Gastroenterology;  Laterality: N/A;  ? EYE SURGERY    ? INSERT / REPLACE / REMOVE PACEMAKER   10/20/2018  ? LAPAROSCOPIC ASSISTED VAGINAL HYSTERECTOMY  12/2009  ? BSO  ? MULTIPLE EXTRACTIONS WITH ALVEOLOPLASTY N/A 12/09/2018  ? Procedure: EXTRACTION OF TOOTH #'S 6,21-29 AND 32 WITH ALVEOLOPLASTY AND BILATERAL MANDIBULAR TORI REDUCTIONS;  Surgeon: Lenn Cal, DDS;  Location: [facility];  Service: Oral Surgery;  Laterality: N/A;  ? PACEMAKER IMPLANT N/A 10/20/2018  ? Procedure: PACEMAKER IMPLANT;  Surgeon: Deboraha Sprang, MD;  Location: [facility];  Service: Cardiovascular;  Laterality: N/A;  ? RIGHT/LEFT HEART CATH AND CORONARY ANGIOGRAPHY N/A 11/24/2018  ? Procedure: RIGHT/LEFT HEART CATH AND CORONARY ANGIOGRAPHY;  Surgeon: Burnell Blanks, MD;  Location: [facility];  Service: Cardiovascular;  Laterality: N/A;  ? TEE WITHOUT CARDIOVERSION N/A 12/21/2018  ? Procedure: TRANSESOPHAGEAL ECHOCARDIOGRAM (TEE);  Surgeon: Burnell Blanks, MD;  Location: [facility];  Service: Open Heart Surgery;  Laterality: N/A;  ? ? ?Family History  ?Problem Relation Age of Onset  ? Heart failure Mother   ? Parkinson's disease Father   ? Lung cancer Brother   ? Heart disease Brother   ? Colon cancer Neg Hx   ? Colon polyps Neg Hx   ? Esophageal cancer Neg Hx   ? ? ?Social History  ? ?Tobacco Use  ? Smoking status: Every Day  ?  Packs/day: 0.50  ?  Years: 55.00  ?  Pack years: 27.50  ?  Types: Cigarettes  ? Smokeless tobacco: Never  ? Tobacco comments:  ?  5-6 cigarettes smoke daily [facility] 10/18/20  ?Vaping Use  ? Vaping Use: Never used  ?Substance Use Topics  ? Alcohol use: No  ? Drug use: Not Currently  ? ? ?ROS ? ? ?Objective:  ? ?Vitals: ?BP (!) 139/57 (BP Location: Left Arm)   Pulse 74   Temp 97.6 ?F (36.4 ?C) (Oral)   Resp 18   SpO2 93%  ? ?Physical Exam ?Constitutional:   ?   General: She is not in acute distress. ?   Appearance: Normal appearance. She is well-developed and normal weight. She is not ill-appearing, toxic-appearing or diaphoretic.  ?HENT:  ?   Head: Normocephalic and atraumatic.  ? ?    Comments: Ecchymosis over the medial aspect of either eye bilaterally and superiorly over the forehead.  There is minimal tenderness over the center of the forehead.  No drainage of pus, warmth, erythema.  No periorbital tenderness. ?   Right Ear: Tympanic membrane, ear canal and external ear normal. No drainage or tenderness. No middle ear effusion. There is no impacted cerumen. Tympanic membrane is not erythematous.  ?   Left Ear: Tympanic membrane, ear canal and external ear normal. No drainage or tenderness.  No middle ear effusion. There is no impacted cerumen. Tympanic membrane is not  erythematous.  ?   Nose: Nose normal. No congestion or rhinorrhea.  ?   Mouth/Throat:  ?   Mouth: Mucous membranes are moist. No oral lesions.  ?   Pharynx: No pharyngeal swelling, oropharyngeal exudate, posterior oropharyngeal erythema or uvula swelling.  ?   Tonsils: No tonsillar exudate or tonsillar abscesses.  ?Eyes:  ?   General: No scleral icterus.    ?   Right eye: No discharge.     ?   Left eye: No discharge.  ?   Extraocular Movements: Extraocular movements intact.  ?   Right eye: Normal extraocular motion.  ?   Left eye: Normal extraocular motion.  ?   Conjunctiva/sclera: Conjunctivae normal.  ?Cardiovascular:  ?   Rate and Rhythm: Normal rate.  ?Pulmonary:  ?   Effort: Pulmonary effort is normal.  ?Musculoskeletal:  ?   Cervical back: Normal range of motion and neck supple.  ?Lymphadenopathy:  ?   Cervical: No cervical adenopathy.  ?Skin: ?   General: Skin is warm and dry.  ?Neurological:  ?   General: No focal deficit present.  ?   Mental Status: She is alert and oriented to person, place, and time.  ?   Cranial Nerves: No cranial nerve deficit.  ?   Motor: No weakness.  ?   Coordination: Coordination normal.  ?   Gait: Gait normal.  ?   Deep Tendon Reflexes: Reflexes normal.  ?   Comments: Negative Romberg and pronator drift.  ?Psychiatric:     ?   Mood and Affect: Mood normal.     ?   Behavior: Behavior normal.   ? ? ?Assessment and Plan :  ? ?PDMP not reviewed this encounter. ? ?1. Facial contusion, initial encounter   ?2. Facial pain   ? ? ?Patient had minimal tenderness on exam.  Overall I do not believe that she is a

## 2021-08-07 ENCOUNTER — Encounter: Payer: Self-pay | Admitting: Nurse Practitioner

## 2021-08-07 ENCOUNTER — Ambulatory Visit (INDEPENDENT_AMBULATORY_CARE_PROVIDER_SITE_OTHER): Payer: Medicare HMO | Admitting: Nurse Practitioner

## 2021-08-07 ENCOUNTER — Other Ambulatory Visit: Payer: Self-pay | Admitting: Internal Medicine

## 2021-08-07 VITALS — BP 129/61 | HR 68 | Temp 97.2°F | Ht 64.96 in | Wt 112.0 lb

## 2021-08-07 DIAGNOSIS — Z7689 Persons encountering health services in other specified circumstances: Secondary | ICD-10-CM

## 2021-08-07 DIAGNOSIS — I10 Essential (primary) hypertension: Secondary | ICD-10-CM

## 2021-08-07 DIAGNOSIS — F411 Generalized anxiety disorder: Secondary | ICD-10-CM

## 2021-08-07 DIAGNOSIS — E782 Mixed hyperlipidemia: Secondary | ICD-10-CM | POA: Diagnosis not present

## 2021-08-07 MED ORDER — CITALOPRAM HYDROBROMIDE 40 MG PO TABS: 20.0000 mg | ORAL_TABLET | Freq: Every day | ORAL | 1 refills | Status: DC

## 2021-08-07 MED ORDER — SIMVASTATIN 20 MG PO TABS: 20.0000 mg | ORAL_TABLET | Freq: Every day | ORAL | 1 refills | Status: DC

## 2021-08-07 NOTE — Progress Notes (Signed)
? ?New Patient Office Visit ? ?Subjective   ? ?Patient ID: [name], female    DOB: 1935-07-22  Age: 86 y.o. MRN: [id number] ? ?CC:  ?Chief Complaint  ?Patient presents with  ? New Patient (Initial Visit)  ? ? ?HPI ?[name] presents to establish care ?The patient is coming from another local provider. She wants to have a provider in [facility].  ?-had a fall back in April. Had facial contusions. Still has some bruising. No imaging was done. She did not lose consciousness at any time and she was neurologically intact.  ?-feels fluid in her sinuses and in her ears. Was ongoing prior to the fall she had.  ?-denies headache  ?-she is due for medication refills ?-states that it is time for wellness visit and routine labs  ? ?Outpatient Encounter Medications as of 08/07/2021  ?Medication Sig  ? acetaminophen (TYLENOL) 325 MG tablet Take 2 tablets (650 mg total) by mouth every 6 (six) hours as needed for moderate pain.  ? albuterol (VENTOLIN HFA) 108 (90 Base) MCG/ACT inhaler Inhale 2 puffs into the lungs every 4 (four) hours as needed for wheezing or shortness of breath.  ? amLODipine (NORVASC) 10 MG tablet Take 1 tablet by mouth once daily  ? ANORO ELLIPTA 62.5-25 MCG/ACT AEPB Inhale 1 puff by mouth once daily  ? aspirin 81 MG chewable tablet Chew 1 tablet (81 mg total) by mouth daily.  ? Cholecalciferol (VITAMIN D-3) 125 MCG (5000 UT) TABS Take 5,000 Units by mouth daily.  ? furosemide (LASIX) 20 MG tablet Take 1 tablet by mouth once daily  ? hydrOXYzine (VISTARIL) 50 MG capsule Take 1 capsule (50 mg total) by mouth 3 (three) times daily as needed for anxiety.  ? Omega-3 Fatty Acids (FISH OIL) 1000 MG CAPS Take 1,000 mg by mouth daily.  ? pantoprazole (PROTONIX) 20 MG tablet Take 1 tablet by mouth once daily  ? Tiotropium Bromide-Olodaterol (STIOLTO RESPIMAT) 2.5-2.5 MCG/ACT AERS Inhale 2 puffs into the lungs daily.  ? vitamin B-12 (CYANOCOBALAMIN) 500 MCG tablet Take 500 mcg by mouth daily.  ? vitamin  E 180 MG (400 UNITS) capsule Take 400 Units by mouth daily.  ? [DISCONTINUED] citalopram (CELEXA) 40 MG tablet Take 20 mg by mouth daily.  ? [DISCONTINUED] simvastatin (ZOCOR) 20 MG tablet Take 20 mg by mouth daily.  ? carboxymethylcellulose (REFRESH PLUS) 0.5 % SOLN Place 1 drop into both eyes daily as needed (dry eyes).  (Patient not taking: Reported on 11/01/2020)  ? citalopram (CELEXA) 40 MG tablet Take 0.5 tablets (20 mg total) by mouth daily.  ? simvastatin (ZOCOR) 20 MG tablet Take 1 tablet (20 mg total) by mouth daily.  ? ?No facility-administered encounter medications on file as of 08/07/2021.  ? ? ?Past Medical History:  ?Diagnosis Date  ? Anxiety   ? Stable  ? ASCUS on Pap smear 11/15/2009  ? Carotid artery disease ([place])   ? 1-39% RICA stenosis and 26-71% LICA stenosis on pre TAVR dopplers   ? Cataract   ? COPD (chronic obstructive pulmonary disease) ([place])   ? Depression   ? Duodenal ulcer due to Helicobacter pylori 24/58/0998  ? Endometrial polyp 11/15/2009  ? Endometrioid adenocarcinoma   ? H/O varicella   ? Heart murmur   ? Hemorrhoids   ? History of measles, mumps, or rubella   ? Hypercholesterolemia   ? Hypertension   ? Menopausal symptoms 06/13/2010  ? Ovarian cyst 11/15/2009  ? PMB (postmenopausal bleeding)  11/15/2009  ? Presence of permanent cardiac pacemaker 10/20/2018  ? Vaginal bleeding   ? ? ?Past Surgical History:  ?Procedure Laterality Date  ? BIOPSY  06/19/2020  ? Procedure: BIOPSY;  Surgeon: Irving Copas., MD;  Location: [place];  Service: Gastroenterology;;  ? BREAST SURGERY    ? Over twenty - five  years ago  ? CATARACT EXTRACTION W/ INTRAOCULAR LENS IMPLANT Bilateral   ? CERVICAL BIOPSY    ? ESOPHAGOGASTRODUODENOSCOPY (EGD) WITH PROPOFOL N/A 06/19/2020  ? Procedure: ESOPHAGOGASTRODUODENOSCOPY (EGD) WITH PROPOFOL;  Surgeon: Rush Landmark Telford Nab., MD;  Location: [place];  Service: Gastroenterology;  Laterality: N/A;  ? EYE SURGERY    ? INSERT / REPLACE / REMOVE PACEMAKER   10/20/2018  ? LAPAROSCOPIC ASSISTED VAGINAL HYSTERECTOMY  12/2009  ? BSO  ? MULTIPLE EXTRACTIONS WITH ALVEOLOPLASTY N/A 12/09/2018  ? Procedure: EXTRACTION OF TOOTH #'S 6,21-29 AND 32 WITH ALVEOLOPLASTY AND BILATERAL MANDIBULAR TORI REDUCTIONS;  Surgeon: Lenn Cal, DDS;  Location: [place];  Service: Oral Surgery;  Laterality: N/A;  ? PACEMAKER IMPLANT N/A 10/20/2018  ? Procedure: PACEMAKER IMPLANT;  Surgeon: Deboraha Sprang, MD;  Location: [place] [facility];  Service: Cardiovascular;  Laterality: N/A;  ? RIGHT/LEFT HEART CATH AND CORONARY ANGIOGRAPHY N/A 11/24/2018  ? Procedure: RIGHT/LEFT HEART CATH AND CORONARY ANGIOGRAPHY;  Surgeon: Burnell Blanks, MD;  Location: [place] [facility];  Service: Cardiovascular;  Laterality: N/A;  ? TEE WITHOUT CARDIOVERSION N/A 12/21/2018  ? Procedure: TRANSESOPHAGEAL ECHOCARDIOGRAM (TEE);  Surgeon: Burnell Blanks, MD;  Location: [place];  Service: Open Heart Surgery;  Laterality: N/A;  ? ? ?Family History  ?Problem Relation Age of Onset  ? Heart failure Mother   ? Parkinson's disease Father   ? Lung cancer Brother   ? Heart disease Brother   ? Colon cancer Neg Hx   ? Colon polyps Neg Hx   ? Esophageal cancer Neg Hx   ? ? ?Social History  ? ?Socioeconomic History  ? Marital status: Legally Separated  ?  Spouse name: Not on file  ? Number of children: Not on file  ? Years of education: Not on file  ? Highest education level: Not on file  ?Occupational History  ? Occupation: Retired-sales rep  ?Tobacco Use  ? Smoking status: Every Day  ?  Packs/day: 0.50  ?  Years: 55.00  ?  Pack years: 27.50  ?  Types: Cigarettes  ? Smokeless tobacco: Never  ? Tobacco comments:  ?  5-6 cigarettes smoke daily ARJ 10/18/20  ?Vaping Use  ? Vaping Use: Never used  ?Substance and Sexual Activity  ? Alcohol use: No  ? Drug use: Not Currently  ? Sexual activity: Not on file  ?Other Topics Concern  ? Not on file  ?Social History Narrative  ? Not on file  ? ?Social Determinants of Health   ? ?Financial Resource Strain: Not on file  ?Food Insecurity: Not on file  ?Transportation Needs: Not on file  ?Physical Activity: Not on file  ?Stress: Not on file  ?Social Connections: Not on file  ?Intimate Partner Violence: Not on file  ? ? ?Review of Systems  ?Constitutional:  Negative for chills, fever and malaise/fatigue.  ?HENT:  Negative for congestion, sinus pain and sore throat.   ?Eyes: Negative.   ?Respiratory:  Negative for cough, shortness of breath and wheezing.   ?Cardiovascular:  Negative for chest pain, palpitations and leg swelling.  ?Gastrointestinal:  Negative for constipation, diarrhea, nausea and vomiting.  ?  Genitourinary: Negative.   ?Musculoskeletal:  Negative for myalgias.  ?Neurological:  Negative for dizziness and headaches.  ?Endo/Heme/Allergies:  Bruises/bleeds easily.  ?Psychiatric/Behavioral:  Negative for depression. The patient is not nervous/anxious.   ? ?  ? ? ?Objective   ? ?Today's Vitals  ? 08/07/21 1113  ?BP: 129/61  ?Pulse: 68  ?Temp: (!) 97.2 ?F (36.2 ?C)  ?SpO2: 96%  ?Weight: 112 lb (50.8 kg)  ?Height: 5' 4.96" (1.65 m)  ? ?Body mass index is 18.66 kg/m?.  ? ?Physical Exam ?Vitals and nursing note reviewed.  ?Constitutional:   ?   Appearance: Normal appearance. She is well-developed.  ?HENT:  ?   Head: Normocephalic and atraumatic.  ?   Nose: Nose normal.  ?   Mouth/Throat:  ?   Mouth: Mucous membranes are moist.  ?   Pharynx: Oropharynx is clear.  ?Eyes:  ?   Extraocular Movements: Extraocular movements intact.  ?   Conjunctiva/sclera: Conjunctivae normal.  ?   Pupils: Pupils are equal, round, and reactive to light.  ?Cardiovascular:  ?   Rate and Rhythm: Normal rate and regular rhythm.  ?   Pulses: Normal pulses.  ?   Heart sounds: Normal heart sounds.  ?Pulmonary:  ?   Effort: Pulmonary effort is normal.  ?   Breath sounds: Normal breath sounds.  ?Abdominal:  ?   Palpations: Abdomen is soft.  ?Musculoskeletal:     ?   General: Normal range of motion.  ?   Cervical back:  Normal range of motion and neck supple.  ?Lymphadenopathy:  ?   Cervical: No cervical adenopathy.  ?Skin: ?   General: Skin is warm and dry.  ?   Capillary Refill: Capillary refill takes less than 2

## 2021-08-08 NOTE — Progress Notes (Signed)
Remote pacemaker transmission.   

## 2021-08-11 DIAGNOSIS — F411 Generalized anxiety disorder: Secondary | ICD-10-CM | POA: Insufficient documentation

## 2021-08-23 ENCOUNTER — Other Ambulatory Visit: Payer: Self-pay

## 2021-08-23 DIAGNOSIS — Z0000 Encounter for general adult medical examination without abnormal findings: Secondary | ICD-10-CM

## 2021-08-23 DIAGNOSIS — Z130 Encounter for screening for diseases of the blood and blood-forming organs and certain disorders involving the immune mechanism: Secondary | ICD-10-CM

## 2021-09-02 ENCOUNTER — Other Ambulatory Visit: Payer: Medicare HMO

## 2021-09-02 DIAGNOSIS — Z130 Encounter for screening for diseases of the blood and blood-forming organs and certain disorders involving the immune mechanism: Secondary | ICD-10-CM

## 2021-09-02 DIAGNOSIS — Z0000 Encounter for general adult medical examination without abnormal findings: Secondary | ICD-10-CM

## 2021-09-03 LAB — CBC WITH DIFFERENTIAL/PLATELET
Basophils Absolute: 0 10*3/uL (ref 0.0–0.2)
Basos: 1 %
EOS (ABSOLUTE): 0.3 10*3/uL (ref 0.0–0.4)
Eos: 7 %
Hematocrit: 45.5 % (ref 34.0–46.6)
Hemoglobin: 15.4 g/dL (ref 11.1–15.9)
Immature Grans (Abs): 0 10*3/uL (ref 0.0–0.1)
Immature Granulocytes: 0 %
Lymphocytes Absolute: 1.5 10*3/uL (ref 0.7–3.1)
Lymphs: 29 %
MCH: 30.3 pg (ref 26.6–33.0)
MCHC: 33.8 g/dL (ref 31.5–35.7)
MCV: 89 fL (ref 79–97)
Monocytes Absolute: 0.6 10*3/uL (ref 0.1–0.9)
Monocytes: 11 %
Neutrophils Absolute: 2.7 10*3/uL (ref 1.4–7.0)
Neutrophils: 52 %
Platelets: 145 10*3/uL — ABNORMAL LOW (ref 150–450)
RBC: 5.09 x10E6/uL (ref 3.77–5.28)
RDW: 13.5 % (ref 11.7–15.4)
WBC: 5.1 10*3/uL (ref 3.4–10.8)

## 2021-09-03 LAB — TSH: TSH: 3.24 u[IU]/mL (ref 0.450–4.500)

## 2021-09-03 LAB — LIPID PANEL
Chol/HDL Ratio: 3 ratio (ref 0.0–4.4)
Cholesterol, Total: 143 mg/dL (ref 100–199)
HDL: 48 mg/dL (ref >39)
LDL Chol Calc (NIH): 78 mg/dL (ref 0–99)
Triglycerides: 88 mg/dL (ref 0–149)
VLDL Cholesterol Cal: 17 mg/dL (ref 5–40)

## 2021-09-03 LAB — COMPREHENSIVE METABOLIC PANEL
ALT: 8 IU/L (ref 0–32)
AST: 25 IU/L (ref 0–40)
Albumin/Globulin Ratio: 2.1 (ref 1.2–2.2)
Albumin: 4.5 g/dL (ref 3.6–4.6)
Alkaline Phosphatase: 88 IU/L (ref 44–121)
BUN/Creatinine Ratio: 22 (ref 12–28)
BUN: 18 mg/dL (ref 8–27)
Bilirubin Total: 0.5 mg/dL (ref 0.0–1.2)
CO2: 22 mmol/L (ref 20–29)
Calcium: 9.5 mg/dL (ref 8.7–10.3)
Chloride: 101 mmol/L (ref 96–106)
Creatinine, Ser: 0.83 mg/dL (ref 0.57–1.00)
Globulin, Total: 2.1 g/dL (ref 1.5–4.5)
Glucose: 86 mg/dL (ref 70–99)
Potassium: 4.1 mmol/L (ref 3.5–5.2)
Sodium: 139 mmol/L (ref 134–144)
Total Protein: 6.6 g/dL (ref 6.0–8.5)
eGFR: 70 mL/min/{1.73_m2} (ref >59)

## 2021-09-03 LAB — HEMOGLOBIN A1C
Est. average glucose Bld gHb Est-mCnc: 105 mg/dL
Hgb A1c MFr Bld: 5.3 % (ref 4.8–5.6)

## 2021-09-03 NOTE — Progress Notes (Signed)
Overall, labs good. Discuss at next viist 09/12/2021

## 2021-09-12 ENCOUNTER — Encounter: Payer: Self-pay | Admitting: Nurse Practitioner

## 2021-09-12 ENCOUNTER — Encounter: Payer: Medicare HMO | Admitting: Nurse Practitioner

## 2021-09-12 ENCOUNTER — Ambulatory Visit (INDEPENDENT_AMBULATORY_CARE_PROVIDER_SITE_OTHER): Payer: Medicare HMO | Admitting: Nurse Practitioner

## 2021-09-12 VITALS — BP 134/56 | HR 81 | Temp 97.7°F | Ht 65.0 in | Wt 111.0 lb

## 2021-09-12 DIAGNOSIS — H900 Conductive hearing loss, bilateral: Secondary | ICD-10-CM | POA: Diagnosis not present

## 2021-09-12 DIAGNOSIS — F411 Generalized anxiety disorder: Secondary | ICD-10-CM

## 2021-09-12 DIAGNOSIS — J449 Chronic obstructive pulmonary disease, unspecified: Secondary | ICD-10-CM | POA: Diagnosis not present

## 2021-09-12 DIAGNOSIS — Z0000 Encounter for general adult medical examination without abnormal findings: Secondary | ICD-10-CM

## 2021-09-12 MED ORDER — ALBUTEROL SULFATE HFA 108 (90 BASE) MCG/ACT IN AERS: 2.0000 | INHALATION_SPRAY | RESPIRATORY_TRACT | 1 refills | Status: DC | PRN

## 2021-09-12 MED ORDER — HYDROXYZINE PAMOATE 50 MG PO CAPS: 50.0000 mg | ORAL_CAPSULE | Freq: Three times a day (TID) | ORAL | 0 refills | Status: DC | PRN

## 2021-09-12 NOTE — Progress Notes (Signed)
Subjective:   [name] is a 86 y.o. female who presents for Medicare Annual (Subsequent) preventive examination.  List any persons and their role that are participating in the visit with the patient.    Anders Simmonds, CMA  Review of Systems    Patient presenting for Medicare Wellness Visit  -routine, fasting labs done prior to this visit.  --platelet count was mildly low. Labs were otherwise normal.   Review of Systems  Constitutional:  Negative for chills, fever and malaise/fatigue.  HENT:  Positive for hearing loss. Negative for congestion, sinus pain and sore throat.   Eyes: Negative.   Respiratory:  Negative for cough, shortness of breath and wheezing.        Copd which is well managed   Cardiovascular:  Negative for chest pain, palpitations and leg swelling.  Gastrointestinal:  Negative for constipation, diarrhea, nausea and vomiting.  Genitourinary: Negative.   Musculoskeletal:  Negative for myalgias.  Skin: Negative.   Neurological:  Negative for dizziness and headaches.  Endo/Heme/Allergies:  Does not bruise/bleed easily.  Psychiatric/Behavioral:  Negative for depression. The patient is nervous/anxious.         Objective:    Today's Vitals   09/12/21 1542  BP: (!) 134/56  Pulse: 81  Temp: 97.7 F (36.5 C)  SpO2: 95%  Weight: 111 lb (50.3 kg)  Height: '5\' 5"'$  (1.651 m)   Body mass index is 18.47 kg/m.     07/05/2020    1:20 AM 06/19/2020    3:05 PM 06/19/2020    2:00 PM 06/19/2020    8:58 AM 12/21/2018    6:00 PM 12/21/2018    6:23 AM 12/17/2018    1:31 PM  Advanced Directives  Does Patient Have a Medical Advance Directive? Yes Yes Yes Yes Yes Yes Yes  Type of Advance Directive Living will;Healthcare Power of [place];Living will Healthcare Power of [place] of [place]  Does patient want to make changes to medical advance directive? No - Patient  declined  No - Patient declined  No - Patient declined  No - Patient declined  Copy of [place] in Chart?     No - copy requested No - copy requested No - copy requested  Would patient like information on creating a medical advance directive?       No - Patient declined    Current Medications (verified) Outpatient Encounter Medications as of 09/12/2021  Medication Sig   acetaminophen (TYLENOL) 325 MG tablet Take 2 tablets (650 mg total) by mouth every 6 (six) hours as needed for moderate pain.   albuterol (VENTOLIN HFA) 108 (90 Base) MCG/ACT inhaler Inhale 2 puffs into the lungs every 4 (four) hours as needed for wheezing or shortness of breath.   amLODipine (NORVASC) 10 MG tablet Take 1 tablet by mouth once daily   ANORO ELLIPTA 62.5-25 MCG/ACT AEPB Inhale 1 puff by mouth once daily   aspirin 81 MG chewable tablet Chew 1 tablet (81 mg total) by mouth daily.   carboxymethylcellulose (REFRESH PLUS) 0.5 % SOLN Place 1 drop into both eyes daily as needed (dry eyes).  (Patient not taking: Reported on 11/01/2020)   Cholecalciferol (VITAMIN D-3) 125 MCG (5000 UT) TABS Take 5,000 Units by mouth daily.   citalopram (CELEXA) 40 MG tablet Take 0.5 tablets (20 mg total) by mouth daily.   furosemide (LASIX) 20 MG tablet Take 1 tablet by mouth  once daily   hydrOXYzine (VISTARIL) 50 MG capsule Take 1 capsule (50 mg total) by mouth 3 (three) times daily as needed for anxiety.   Omega-3 Fatty Acids (FISH OIL) 1000 MG CAPS Take 1,000 mg by mouth daily.   pantoprazole (PROTONIX) 20 MG tablet Take 1 tablet by mouth once daily   simvastatin (ZOCOR) 20 MG tablet Take 1 tablet (20 mg total) by mouth daily.   Tiotropium Bromide-Olodaterol (STIOLTO RESPIMAT) 2.5-2.5 MCG/ACT AERS Inhale 2 puffs into the lungs daily.   vitamin B-12 (CYANOCOBALAMIN) 500 MCG tablet Take 500 mcg by mouth daily.   vitamin E 180 MG (400 UNITS) capsule Take 400 Units by mouth daily.   [DISCONTINUED] albuterol (VENTOLIN HFA)  108 (90 Base) MCG/ACT inhaler Inhale 2 puffs into the lungs every 4 (four) hours as needed for wheezing or shortness of breath.   [DISCONTINUED] hydrOXYzine (VISTARIL) 50 MG capsule Take 1 capsule (50 mg total) by mouth 3 (three) times daily as needed for anxiety.   No facility-administered encounter medications on file as of 09/12/2021.    Allergies (verified) Patient has no known allergies.   History: Past Medical History:  Diagnosis Date   Anxiety    Stable   ASCUS on Pap smear 11/15/2009   Carotid artery disease ([facility])    1-39% RICA stenosis and 94-17% LICA stenosis on pre TAVR dopplers    Cataract    COPD (chronic obstructive pulmonary disease) ([facility])    Depression    Duodenal ulcer due to Helicobacter pylori 40/81/4481   Endometrial polyp 11/15/2009   Endometrioid adenocarcinoma    H/O varicella    Heart murmur    Hemorrhoids    History of measles, mumps, or rubella    Hypercholesterolemia    Hypertension    Menopausal symptoms 06/13/2010   Ovarian cyst 11/15/2009   PMB (postmenopausal bleeding) 11/15/2009   Presence of permanent cardiac pacemaker 10/20/2018   Vaginal bleeding    Past Surgical History:  Procedure Laterality Date   BIOPSY  06/19/2020   Procedure: BIOPSY;  Surgeon: Irving Copas., MD;  [facility];  Service: Gastroenterology;;   BREAST SURGERY     Over twenty - five  years ago   CATARACT EXTRACTION W/ INTRAOCULAR LENS IMPLANT Bilateral    CERVICAL BIOPSY     ESOPHAGOGASTRODUODENOSCOPY (EGD) WITH PROPOFOL N/A 06/19/2020   Procedure: ESOPHAGOGASTRODUODENOSCOPY (EGD) WITH PROPOFOL;  Surgeon: Irving Copas., MD;  Location: [facility];  Service: Gastroenterology;  Laterality: N/A;   EYE SURGERY     INSERT / REPLACE / REMOVE PACEMAKER  10/20/2018   LAPAROSCOPIC ASSISTED VAGINAL HYSTERECTOMY  12/2009   BSO   MULTIPLE EXTRACTIONS WITH ALVEOLOPLASTY N/A 12/09/2018   Procedure: EXTRACTION OF TOOTH #'S 6,21-29 AND 32 WITH  ALVEOLOPLASTY AND BILATERAL MANDIBULAR TORI REDUCTIONS;  Surgeon: Lenn Cal, DDS;  Location: [place];  Service: Oral Surgery;  Laterality: N/A;   PACEMAKER IMPLANT N/A 10/20/2018   Procedure: PACEMAKER IMPLANT;  Surgeon: Deboraha Sprang, MD;  Location: [place] [facility];  Service: Cardiovascular;  Laterality: N/A;   RIGHT/LEFT HEART CATH AND CORONARY ANGIOGRAPHY N/A 11/24/2018   Procedure: RIGHT/LEFT HEART CATH AND CORONARY ANGIOGRAPHY;  Surgeon: Burnell Blanks, MD;  Location: [place] [facility];  Service: Cardiovascular;  Laterality: N/A;   TEE WITHOUT CARDIOVERSION N/A 12/21/2018   Procedure: TRANSESOPHAGEAL ECHOCARDIOGRAM (TEE);  Surgeon: Burnell Blanks, MD;  Location: [place];  Service: Open Heart Surgery;  Laterality: N/A;   Family History  Problem Relation Age of  Onset   Heart failure Mother    Parkinson's disease Father    Lung cancer Brother    Heart disease Brother    Colon cancer Neg Hx    Colon polyps Neg Hx    Esophageal cancer Neg Hx    Social History   Socioeconomic History   Marital status: Widowed    Spouse name: Not on file   Number of children: Not on file   Years of education: Not on file   Highest education level: Not on file  Occupational History   Occupation: Retired-sales rep  Tobacco Use   Smoking status: Every Day    Packs/day: 0.25    Years: 55.00    Total pack years: 13.75    Types: Cigarettes   Smokeless tobacco: Never   Tobacco comments:    5-6 cigarettes smoke daily ARJ 10/18/20  Vaping Use   Vaping Use: Never used  Substance and Sexual Activity   Alcohol use: No   Drug use: Not Currently   Sexual activity: Not Currently  Other Topics Concern   Not on file  Social History Narrative   Not on file   Social Determinants of Health   Financial Resource Strain: Low Risk  (09/12/2021)   Overall Financial Resource Strain (CARDIA)    Difficulty of Paying Living Expenses: Not very hard  Food Insecurity: No Food Insecurity  (09/12/2021)   Hunger Vital Sign    Worried About Running Out of Food in the Last Year: Never true    [place] in the Last Year: Never true  Transportation Needs: No Transportation Needs (09/12/2021)   PRAPARE - Hydrologist (Medical): No    Lack of Transportation (Non-Medical): No  Physical Activity: Sufficiently Active (09/12/2021)   Exercise Vital Sign    Days of Exercise per Week: 5 days    Minutes of Exercise per Session: 30 min  Stress: No Stress Concern Present (09/12/2021)   [place]    Feeling of Stress : Not at all  Social Connections: Moderately Integrated (09/12/2021)   Social Connection and Isolation Panel [NHANES]    Frequency of Communication with Friends and Family: More than three times a week    Frequency of Social Gatherings with Friends and Family: More than three times a week    Attends Religious Services: 1 to 4 times per year    Active Member of [employer] or Organizations: Yes    Attends Archivist Meetings: Never    Marital Status: Widowed    Tobacco Counseling Ready to quit: Not Answered Counseling given: Not Answered Tobacco comments: 5-6 cigarettes smoke daily ARJ 10/18/20   Diabetic?NO    Activities of Daily Living    09/12/2021    3:46 PM 08/07/2021   11:38 AM  In your present state of health, do you have any difficulty performing the following activities:  Hearing? 1 1  Vision? 0 0  Difficulty concentrating or making decisions? 0 1  Walking or climbing stairs? 0 1  Dressing or bathing? 0 0  Doing errands, shopping? 0 1    Patient Care Team: Ronnell Freshwater, NP as PCP - General (Family Medicine) Deboraha Sprang, MD as PCP - Electrophysiology (Cardiology) Burnell Blanks, MD as PCP - Cardiology (Cardiology)  Indicate any recent Medical Services you may have received from other than [facility] providers in the past year (date may be  approximate).  Assessment:   1. Encounter for Medicare annual wellness exam Annual medicare wellness visit today   2. Chronic obstructive pulmonary disease, unspecified COPD type ([place]) Continue inhalers and respiratory medications as prescribed. Renew rescue inhaler today. Use as needed and as prescribed  - albuterol (VENTOLIN HFA) 108 (90 Base) MCG/ACT inhaler; Inhale 2 puffs into the lungs every 4 (four) hours as needed for wheezing or shortness of breath.  Dispense: 8 g; Refill: 1  3. Generalized anxiety disorder Conitnue celexa daily. May take hydroxyzine as needed and as prescribed. Refill hydroxyzine today  - hydrOXYzine (VISTARIL) 50 MG capsule; Take 1 capsule (50 mg total) by mouth 3 (three) times daily as needed for anxiety.  Dispense: 180 capsule; Refill: 0  4. Conductive hearing loss, bilateral Refer to audiology as scheduled.  - Ambulatory referral to Audiology     Depression Screen    09/12/2021    3:45 PM 08/07/2021   11:37 AM  PHQ 2/9 Scores  PHQ - 2 Score 2 0  PHQ- 9 Score 2 3    Fall Risk    09/12/2021    3:45 PM 08/07/2021   11:16 AM  Fall Risk   Falls in the past year? 1 1  Number falls in past yr: 1 0  Injury with Fall? 1 1  Risk for fall due to : Impaired balance/gait;Impaired mobility;History of fall(s)   Follow up Falls evaluation completed Falls evaluation completed    [place]:  Any stairs in or around the home? Yes  If so, are there any without handrails? Yes  Home free of loose throw rugs in walkways, pet beds, electrical cords, etc? Yes  Adequate lighting in your home to reduce risk of falls? Yes   ASSISTIVE DEVICES UTILIZED TO PREVENT FALLS:  Life alert? No  Use of a cane, walker or w/c? No  Grab bars in the bathroom? No  Shower chair or bench in shower? No  Elevated toilet seat or a handicapped toilet? No   TIMED UP AND GO:  Was the test performed? Yes .  Length of time to ambulate 10 feet: 10  sec.   Gait slow and steady without use of assistive device  Cognitive Function:        09/12/2021    3:50 PM  6CIT Screen  What Year? 0 points  What month? 0 points  What time? 0 points  Count back from 20 0 points  Months in reverse 0 points  Repeat phrase 0 points  Total Score 0 points    Immunizations  There is no immunization history on file for this patient.  TDAP status: Due, Education has been provided regarding the importance of this vaccine. Advised may receive this vaccine at local pharmacy or Health Dept. Aware to provide a copy of the vaccination record if obtained from local pharmacy or Health Dept. Verbalized acceptance and understanding.  Flu Vaccine status: Up to date  Pneumococcal vaccine status: Up to date  Covid-19 vaccine status: Completed vaccines  Qualifies for Shingles Vaccine? Yes   Zostavax completed Yes   Shingrix Completed?: Yes  Screening Tests Health Maintenance  Topic Date Due   COVID-19 Vaccine (1) Never done   Pneumonia Vaccine 23+ Years old (1 - PCV) Never done   TETANUS/TDAP  Never done   Zoster Vaccines- Shingrix (1 of 2) Never done   INFLUENZA VACCINE  10/29/2021   DEXA SCAN  Completed   HPV VACCINES  Aged Out  Health Maintenance  Health Maintenance Due  Topic Date Due   COVID-19 Vaccine (1) Never done   Pneumonia Vaccine 49+ Years old (1 - PCV) Never done   TETANUS/TDAP  Never done   Zoster Vaccines- Shingrix (1 of 2) Never done    Colorectal cancer screening: No longer required.   Mammogram status: No longer required due to age.  Dexa: patient declined  Lung Cancer Screening: (Low Dose CT Chest recommended if Age 61-80 years, 30 pack-year currently smoking OR have quit w/in 15years.) does not qualify.   Lung Cancer Screening Referral: Age  Additional Screening:  Hepatitis C Screening: does not qualify; patient declined  Vision Screening: Recommended annual ophthalmology exams for early detection of  glaucoma and other disorders of the eye. Is the patient up to date with their annual eye exam?  No  Who is the provider or what is the name of the office in which the patient attends annual eye exams? No-patient declined If pt is not established with a provider, would they like to be referred to a provider to establish care? No .   Dental Screening: Recommended annual dental exams for proper oral hygiene  Community Resource Referral / Chronic Care Management: CRR required this visit?  No   CCM required this visit?  No      Plan:     I have personally reviewed and noted the following in the patient's chart:   Medical and social history Use of alcohol, tobacco or illicit drugs  Current medications and supplements including opioid prescriptions.  Functional ability and status Nutritional status Physical activity Advanced directives List of other physicians Hospitalizations, surgeries, and ER visits in previous 12 months Vitals Screenings to include cognitive, depression, and falls Referrals and appointments  In addition, I have reviewed and discussed with patient certain preventive protocols, quality metrics, and best practice recommendations. A written personalized care plan for preventive services as well as general preventive health recommendations were provided to patient.     Ronnell Freshwater, NP   09/12/2021   Nurse Notes: face to face 20 minutes.       Ms. Clavel , Thank you for taking time to come for your Medicare Wellness Visit. I appreciate your ongoing commitment to your health goals. Please review the following plan we discussed and let me know if I can assist you in the future.     This is a list of the screening recommended for you and due dates:  Health Maintenance  Topic Date Due   COVID-19 Vaccine (1) Never done   Pneumonia Vaccine (1 - PCV) Never done   Tetanus Vaccine  Never done   Zoster (Shingles) Vaccine (1 of 2) Never done   Flu Shot  10/29/2021    DEXA scan (bone density measurement)  Completed   HPV Vaccine  Aged [address] [place], [place], FNP-c  09/12/2021

## 2021-09-12 NOTE — Progress Notes (Deleted)
Established patient visit   Patient: [name]   DOB: 1935-04-12   86 y.o. Female  MRN: [id number] Visit Date: 09/12/2021   No chief complaint on file.  Subjective    HPI  Patient presenting for follow up visit.  -routine, fasting labs done prior to this visit.  --platelet count was mildly low. Labs were otherwise normal.   Medications: Outpatient Medications Prior to Visit  Medication Sig   acetaminophen (TYLENOL) 325 MG tablet Take 2 tablets (650 mg total) by mouth every 6 (six) hours as needed for moderate pain.   albuterol (VENTOLIN HFA) 108 (90 Base) MCG/ACT inhaler Inhale 2 puffs into the lungs every 4 (four) hours as needed for wheezing or shortness of breath.   amLODipine (NORVASC) 10 MG tablet Take 1 tablet by mouth once daily   ANORO ELLIPTA 62.5-25 MCG/ACT AEPB Inhale 1 puff by mouth once daily   aspirin 81 MG chewable tablet Chew 1 tablet (81 mg total) by mouth daily.   carboxymethylcellulose (REFRESH PLUS) 0.5 % SOLN Place 1 drop into both eyes daily as needed (dry eyes).  (Patient not taking: Reported on 11/01/2020)   Cholecalciferol (VITAMIN D-3) 125 MCG (5000 UT) TABS Take 5,000 Units by mouth daily.   citalopram (CELEXA) 40 MG tablet Take 0.5 tablets (20 mg total) by mouth daily.   furosemide (LASIX) 20 MG tablet Take 1 tablet by mouth once daily   hydrOXYzine (VISTARIL) 50 MG capsule Take 1 capsule (50 mg total) by mouth 3 (three) times daily as needed for anxiety.   Omega-3 Fatty Acids (FISH OIL) 1000 MG CAPS Take 1,000 mg by mouth daily.   pantoprazole (PROTONIX) 20 MG tablet Take 1 tablet by mouth once daily   simvastatin (ZOCOR) 20 MG tablet Take 1 tablet (20 mg total) by mouth daily.   Tiotropium Bromide-Olodaterol (STIOLTO RESPIMAT) 2.5-2.5 MCG/ACT AERS Inhale 2 puffs into the lungs daily.   vitamin B-12 (CYANOCOBALAMIN) 500 MCG tablet Take 500 mcg by mouth daily.   vitamin E 180 MG (400 UNITS) capsule Take 400 Units by mouth daily.   No  facility-administered medications prior to visit.    Review of Systems  {Labs (Optional):23779}   Objective    There were no vitals taken for this visit. BP Readings from Last 3 Encounters:  08/07/21 129/61  07/26/21 (!) 139/57  11/01/20 (!) 113/51    Wt Readings from Last 3 Encounters:  08/07/21 112 lb (50.8 kg)  11/01/20 105 lb (47.6 kg)  10/26/20 105 lb (47.6 kg)    Physical Exam  ***  No results found for any visits on 09/12/21.  Assessment & Plan     Problem List Items Addressed This Visit   None    No follow-ups on file.         Ronnell Freshwater, NP  [facility] at [facility] [phone] (phone) [phone] (fax)  [employer]

## 2021-09-12 NOTE — Patient Instructions (Addendum)
Primary Care at [place] 65 Years and Older, Female Preventive care refers to lifestyle choices and visits with your health care provider that can promote health and wellness. Preventive care visits are also called wellness exams. What can I expect for my preventive care visit? Counseling Your health care provider may ask you questions about your: Medical history, including: Past medical problems. Family medical history. Pregnancy and menstrual history. History of falls. Current health, including: Memory and ability to understand (cognition). Emotional well-being. Home life and relationship well-being. Sexual activity and sexual health. Lifestyle, including: Alcohol, nicotine or tobacco, and drug use. Access to firearms. Diet, exercise, and sleep habits. Work and work Statistician. Sunscreen use. Safety issues such as seatbelt and bike helmet use. Physical exam Your health care provider will check your: Height and weight. These may be used to calculate your BMI (body mass index). BMI is a measurement that tells if you are at a healthy weight. Waist circumference. This measures the distance around your waistline. This measurement also tells if you are at a healthy weight and may help predict your risk of certain diseases, such as type 2 diabetes and high blood pressure. Heart rate and blood pressure. Body temperature. Skin for abnormal spots. What immunizations do I need?  Vaccines are usually given at various ages, according to a schedule. Your health care provider will recommend vaccines for you based on your age, medical history, and lifestyle or other factors, such as travel or where you work. What tests do I need? Screening Your health care provider may recommend screening tests for certain conditions. This may include: Lipid and cholesterol levels. Hepatitis C test. Hepatitis B test. HIV (human immunodeficiency virus) test. STI (sexually  transmitted infection) testing, if you are at risk. Lung cancer screening. Colorectal cancer screening. Diabetes screening. This is done by checking your blood sugar (glucose) after you have not eaten for a while (fasting). Mammogram. Talk with your health care provider about how often you should have regular mammograms. BRCA-related cancer screening. This may be done if you have a family history of breast, ovarian, tubal, or peritoneal cancers. Bone density scan. This is done to screen for osteoporosis. Talk with your health care provider about your test results, treatment options, and if necessary, the need for more tests. Follow these instructions at home: Eating and drinking  Eat a diet that includes fresh fruits and vegetables, whole grains, lean protein, and low-fat dairy products. Limit your intake of foods with high amounts of sugar, saturated fats, and salt. Take vitamin and mineral supplements as recommended by your health care provider. Do not drink alcohol if your health care provider tells you not to drink. If you drink alcohol: Limit how much you have to 0-1 drink a day. Know how much alcohol is in your drink. In the [place] one drink equals one 12 oz bottle of beer (355 mL), one 5 oz glass of wine (148 mL), or one 1 oz glass of hard liquor (44 mL). Lifestyle Brush your teeth every morning and night with fluoride toothpaste. Floss one time each day. Exercise for at least 30 minutes 5 or more days each week. Do not use any products that contain nicotine or tobacco. These products include cigarettes, chewing tobacco, and vaping devices, such as e-cigarettes. If you need help quitting, ask your health care provider. Do not use drugs. If you are sexually active, practice safe sex. Use a condom or other form of protection in order to prevent  STIs. Take aspirin only as told by your health care provider. Make sure that you understand how much to take and what form to take. Work with your  health care provider to find out whether it is safe and beneficial for you to take aspirin daily. Ask your health care provider if you need to take a cholesterol-lowering medicine (statin). Find healthy ways to manage stress, such as: Meditation, yoga, or listening to music. Journaling. Talking to a trusted person. Spending time with friends and family. Minimize exposure to UV radiation to reduce your risk of skin cancer. Safety Always wear your seat belt while driving or riding in a vehicle. Do not drive: If you have been drinking alcohol. Do not ride with someone who has been drinking. When you are tired or distracted. While texting. If you have been using any mind-altering substances or drugs. Wear a helmet and other protective equipment during sports activities. If you have firearms in your house, make sure you follow all gun safety procedures. What's next? Visit your health care provider once a year for an annual wellness visit. Ask your health care provider how often you should have your eyes and teeth checked. Stay up to date on all vaccines. This information is not intended to replace advice given to you by your health care provider. Make sure you discuss any questions you have with your health care provider. Document Revised: 09/12/2020 Document Reviewed: 09/12/2020 Elsevier Patient Education  [place].

## 2021-09-15 ENCOUNTER — Other Ambulatory Visit: Payer: Self-pay | Admitting: Internal Medicine

## 2021-09-19 ENCOUNTER — Ambulatory Visit: Payer: Medicare HMO | Attending: Audiology | Admitting: Audiology

## 2021-09-19 DIAGNOSIS — H903 Sensorineural hearing loss, bilateral: Secondary | ICD-10-CM | POA: Insufficient documentation

## 2021-09-25 ENCOUNTER — Other Ambulatory Visit: Payer: Self-pay | Admitting: Emergency Medicine

## 2021-09-26 ENCOUNTER — Telehealth: Payer: Self-pay

## 2021-09-26 NOTE — Telephone Encounter (Signed)
Recivied Rx refill for Anoro. ATC LVMTCB, pt needs OV.

## 2021-10-22 ENCOUNTER — Ambulatory Visit (INDEPENDENT_AMBULATORY_CARE_PROVIDER_SITE_OTHER): Payer: Medicare HMO

## 2021-10-22 DIAGNOSIS — I442 Atrioventricular block, complete: Secondary | ICD-10-CM | POA: Diagnosis not present

## 2021-10-22 LAB — CUP PACEART REMOTE DEVICE CHECK
Battery Remaining Longevity: 108 mo
Battery Voltage: 3 V
Brady Statistic AP VP Percent: 1.35 %
Brady Statistic AP VS Percent: 0 %
Brady Statistic AS VP Percent: 98.57 %
Brady Statistic AS VS Percent: 0.09 %
Brady Statistic RA Percent Paced: 1.36 %
Brady Statistic RV Percent Paced: 99.91 %
Date Time Interrogation Session: 20230725005631
Implantable Lead Implant Date: 20200722
Implantable Lead Implant Date: 20200722
Implantable Lead Location: 753859
Implantable Lead Location: 753860
Implantable Lead Model: 1944
Implantable Lead Model: 1948
Implantable Pulse Generator Implant Date: 20200722
Lead Channel Impedance Value: 380 Ohm
Lead Channel Impedance Value: 418 Ohm
Lead Channel Impedance Value: 513 Ohm
Lead Channel Impedance Value: 627 Ohm
Lead Channel Pacing Threshold Amplitude: 0.375 V
Lead Channel Pacing Threshold Amplitude: 0.5 V
Lead Channel Pacing Threshold Pulse Width: 0.4 ms
Lead Channel Pacing Threshold Pulse Width: 0.4 ms
Lead Channel Sensing Intrinsic Amplitude: 2 mV
Lead Channel Sensing Intrinsic Amplitude: 2 mV
Lead Channel Sensing Intrinsic Amplitude: 5 mV
Lead Channel Sensing Intrinsic Amplitude: 5 mV
Lead Channel Setting Pacing Amplitude: 1.5 V
Lead Channel Setting Pacing Amplitude: 2.5 V
Lead Channel Setting Pacing Pulse Width: 0.4 ms
Lead Channel Setting Sensing Sensitivity: 2 mV

## 2021-10-29 ENCOUNTER — Other Ambulatory Visit: Payer: Self-pay | Admitting: Internal Medicine

## 2021-10-31 ENCOUNTER — Ambulatory Visit (INDEPENDENT_AMBULATORY_CARE_PROVIDER_SITE_OTHER): Payer: Medicare HMO | Admitting: Physician Assistant

## 2021-10-31 VITALS — BP 125/48 | HR 73 | Ht 65.0 in | Wt 108.0 lb

## 2021-10-31 DIAGNOSIS — I5032 Chronic diastolic (congestive) heart failure: Secondary | ICD-10-CM | POA: Diagnosis not present

## 2021-10-31 DIAGNOSIS — I272 Pulmonary hypertension, unspecified: Secondary | ICD-10-CM

## 2021-10-31 DIAGNOSIS — Z952 Presence of prosthetic heart valve: Secondary | ICD-10-CM

## 2021-10-31 DIAGNOSIS — Z950 Presence of cardiac pacemaker: Secondary | ICD-10-CM

## 2021-10-31 DIAGNOSIS — J449 Chronic obstructive pulmonary disease, unspecified: Secondary | ICD-10-CM

## 2021-10-31 MED ORDER — AMLODIPINE BESYLATE 10 MG PO TABS: 10.0000 mg | ORAL_TABLET | Freq: Every day | ORAL | 3 refills | Status: DC

## 2021-10-31 NOTE — Patient Instructions (Signed)
Medication Instructions:  Your physician recommends that you continue on your current medications as directed. Please refer to the Current Medication list given to you today.  *If you need a refill on your cardiac medications before your next appointment, please call your pharmacy*   Lab Work: NONE If you have labs (blood work) drawn today and your tests are completely normal, you will receive your results only by: [place] (if you have MyChart) OR A paper copy in the mail If you have any lab test that is abnormal or we need to change your treatment, we will call you to review the results.   Testing/Procedures: Your physician has requested that you have an echocardiogram. Echocardiography is a painless test that uses sound waves to create images of your heart. It provides your doctor with information about the size and shape of your heart and how well your heart's chambers and valves are working. This procedure takes approximately one hour. There are no restrictions for this procedure.    Follow-Up: At [facility], you and your health needs are our priority.  As part of our continuing mission to provide you with exceptional heart care, we have created designated Provider Care Teams.  These Care Teams include your primary Cardiologist (physician) and Advanced Practice Providers (APPs -  Physician Assistants and Nurse Practitioners) who all work together to provide you with the care you need, when you need it.  We recommend signing up for the patient portal called "MyChart".  Sign up information is provided on this After Visit Summary.  MyChart is used to connect with patients for Virtual Visits (Telemedicine).  Patients are able to view lab/test results, encounter notes, upcoming appointments, etc.  Non-urgent messages can be sent to your provider as well.   To learn more about what you can do with MyChart, go to [email or web address].    Your next appointment:   1 year(s)  The  format for your next appointment:   In Person  Provider:   Lauree Chandler, MD{   Important Information About Sugar

## 2021-10-31 NOTE — Progress Notes (Signed)
[facility] [place]                                     Cardiology Office Note:    Date:  10/31/2021   ID:  [name], DOB [birth year], MRN [id number]  PCP:  Ronnell Freshwater, NP  [facility] Cardiologist:  Lauree Chandler, MD  [facility] Electrophysiologist:  Virl Axe, MD   Referring MD: Leonard Downing, *   CC: follow up  History of Present Illness:    [name] is a 86 y.o. female with a hx of HTN, HLD, mild nonobstructive CAD, severe AS s/p TAVR 11/2018 (mild-moderate AI by echo 06/2020), moderate pulmonary HTN by echo 06/2020, high grade AV block s/p PPM 03/5398, chronic diastolic CHF, anxiety, depression, carotid artery disease (8-67% RICA, 61-95% LICA 0/9326), Plavix allergy, endometrial adenocarcinoma 2011, longstanding tobacco abuse, and GI bleed who presents to clinic for follow up.   To recap history, she was admitted in 2020 for near-syncope due to 2:1 AVB with intermittent CHB and also was found to have severe AS. She underwent [employer] Jude PPM implant. Cath 10/2018 showed nonobstructive CAD and she went onto have TAVR 11/2018 with a 23 mm Edwards Sapien 3 Ultra THV via the subclavian approach. Post-hospital course was notable for Plavix allergy, so she has been continued on aspirin indefinitely. She was recently hospitalized 05/2020 for ABL anemia (Hgb 9-10 range) with melena 2/2 UGIB. EGD showed hiatal hernia, gastritis, and nonbleeding duodenal ulcer with + H Pylori. She was treated for this and cleared to resume her ASA. She was readmitted 4/6-07/06/20 with SOB and black stools. Her Hgb was stable so was not felt to require further procedures. GI recommended to hold antibiotics for H. Pylori with plan to revisit different regimen as outpatient. She was continued on PPI. D-dimer was elevated but CTA was negative for PE and LE venous duplex negative for DVT. Cardiology was consulted for possible contribution of CHF and  mildly elevated troponin. 2D Echo 07/06/20 EF 50-55%, apical hypokinesis, mild LVH, mildly enlarged RV, moderately elevated PASP (55.24m hg), dilated IVC, mild-moderate AI. Her apical hypokinesis was felt to be a reflection of RV pacing, not ischemic process. Troponins were low and flat 33-36 not felt to reflect ACS. She was treated with a dose of IV Lasix and recommended for PO Lasix x 4 days for possible component of fluid overload in the setting of IV fluid administration for GI bleed.  She was seen by DMelina CopaPA-C in the office on 07/12/20 and she continued to complain of shortness of breath. This was felt to be multifactorial due to underlying COPD (no formal diagnosis), anemia and CHF. She was felt to be euvolemic at that time and further diuretics were not recommended. Follow up lab work showed Hg was actually in a normal range at 12.3. Given new moderate aortic insufficiency, her echo was discussed with the multidisciplinary valve team. Cardiac CT was recommended. This showed central AI with hypo-attenuation and leaflet thickening. Given that she was not a good anticoagulation candidate with recent GI bleed and leaflet thickening can come and go, it was felt best to continue observation with serial echos. Repeat echo 10/09/20 with LVEF=55-60%, mild MR, mld AI. She is now followed in the pulmonary office by Dr. BLamonte Sakaiand is being treated for severe COPD.   Today the patient  presents to clinic for follow up. Here with her daughter. Still has dyspnea but doing better. She also has leg weakness but no pain. No CP. No LE edema, orthopnea or PND. No dizziness or syncope. No blood in stool or urine. No palpitations. Does have fatigue and goes outside to work in her garden but has to take frequent breaks on bucket. Smoking 5 cigs a day.     Past Medical History:  Diagnosis Date   Anxiety    Stable   ASCUS on Pap smear 11/15/2009   Carotid artery disease (HCC)    1-39% RICA stenosis and 95-09% LICA  stenosis on pre TAVR dopplers    Cataract    COPD (chronic obstructive pulmonary disease) (HCC)    Depression    Duodenal ulcer due to Helicobacter pylori 32/67/1245   Endometrial polyp 11/15/2009   Endometrioid adenocarcinoma    H/O varicella    Heart murmur    Hemorrhoids    History of measles, mumps, or rubella    Hypercholesterolemia    Hypertension    Menopausal symptoms 06/13/2010   Ovarian cyst 11/15/2009   PMB (postmenopausal bleeding) 11/15/2009   Presence of permanent cardiac pacemaker 10/20/2018   Vaginal bleeding     Past Surgical History:  Procedure Laterality Date   BIOPSY  06/19/2020   Procedure: BIOPSY;  Surgeon: Irving Copas., MD;  Location: [facility] ENDOSCOPY;  Service: Gastroenterology;;   BREAST SURGERY     Over twenty - five  years ago   CATARACT EXTRACTION W/ INTRAOCULAR LENS IMPLANT Bilateral    CERVICAL BIOPSY     ESOPHAGOGASTRODUODENOSCOPY (EGD) WITH PROPOFOL N/A 06/19/2020   Procedure: ESOPHAGOGASTRODUODENOSCOPY (EGD) WITH PROPOFOL;  Surgeon: Irving Copas., MD;  Location: [facility] END[facility]COPY;  Service: Gastroenterology;  Laterality: N/A;   EYE SURGERY     INSERT / REPLACE / REMOVE PACEMAKER  10/20/2018   LAPAROSCOPIC ASSISTED VAGINAL HYSTERECTOMY  12/2009   BSO   MULTIPLE EXTRACTIONS WITH ALVEOLOPLASTY N/A 12/09/2018   Procedure: EXTRACTION OF TOOTH #'S 6,21-29 AND 32 WITH ALVEOLOPLASTY AND BILATERAL MANDIBULAR TORI REDUCTIONS;  Surgeon: Lenn Cal, DDS;  Location: [place];  Service: Oral Surgery;  Laterality: N/A;   PACEMAKER IMPLANT N/A 10/20/2018   Procedure: PACEMAKER IMPLANT;  Surgeon: Deboraha Sprang, MD;  Location: [place] CV LAB;  Service: Cardiovascular;  Laterality: N/A;   RIGHT/LEFT HEART CATH AND CORONARY ANGIOGRAPHY N/A 11/24/2018   Procedure: RIGHT/LEFT HEART CATH AND CORONARY ANGIOGRAPHY;  Surgeon: Burnell Blanks, MD;  Location: [place] CV LAB;  Service: Cardiovascular;  Laterality: N/A;   TEE WITHOUT  CARDIOVERSION N/A 12/21/2018   Procedure: TRANSESOPHAGEAL ECHOCARDIOGRAM (TEE);  Surgeon: Burnell Blanks, MD;  Location: [place];  Service: Open Heart Surgery;  Laterality: N/A;    Current Medications: Current Meds  Medication Sig   acetaminophen (TYLENOL) 325 MG tablet Take 2 tablets (650 mg total) by mouth every 6 (six) hours as needed for moderate pain.   albuterol (VENTOLIN HFA) 108 (90 Base) MCG/ACT inhaler Inhale 2 puffs into the lungs every 4 (four) hours as needed for wheezing or shortness of breath.   ANORO ELLIPTA 62.5-25 MCG/ACT AEPB Inhale 1 puff by mouth once daily   aspirin 81 MG chewable tablet Chew 1 tablet (81 mg total) by mouth daily.   carboxymethylcellulose (REFRESH PLUS) 0.5 % SOLN Place 1 drop into both eyes daily as needed (dry eyes).   Cholecalciferol (VITAMIN D-3) 125 MCG (5000 UT) TABS Take 5,000 Units by mouth daily.  citalopram (CELEXA) 40 MG tablet Take 0.5 tablets (20 mg total) by mouth daily.   furosemide (LASIX) 20 MG tablet Take 1 tablet by mouth once daily   hydrOXYzine (VISTARIL) 50 MG capsule Take 1 capsule (50 mg total) by mouth 3 (three) times daily as needed for anxiety.   Omega-3 Fatty Acids (FISH OIL) 1000 MG CAPS Take 1,000 mg by mouth daily.   pantoprazole (PROTONIX) 20 MG tablet Take 1 tablet by mouth once daily   simvastatin (ZOCOR) 20 MG tablet Take 1 tablet (20 mg total) by mouth daily.   Tiotropium Bromide-Olodaterol (STIOLTO RESPIMAT) 2.5-2.5 MCG/ACT AERS Inhale 2 puffs into the lungs daily.   vitamin B-12 (CYANOCOBALAMIN) 500 MCG tablet Take 500 mcg by mouth daily.   vitamin E 180 MG (400 UNITS) capsule Take 400 Units by mouth daily.   [DISCONTINUED] amLODipine (NORVASC) 10 MG tablet Take 1 tablet (10 mg total) by mouth daily. Please schedule appointment for future refills. Thank you     Allergies:   Patient has no known allergies.   Social History   Socioeconomic History   Marital status: Widowed    Spouse name: Not on file    Number of children: Not on file   Years of education: Not on file   Highest education level: Not on file  Occupational History   Occupation: Retired-sales rep  Tobacco Use   Smoking status: Every Day    Packs/day: 0.25    Years: 55.00    Total pack years: 13.75    Types: Cigarettes   Smokeless tobacco: Never   Tobacco comments:    5-6 cigarettes smoke daily ARJ 10/18/20  Vaping Use   Vaping Use: Never used  Substance and Sexual Activity   Alcohol use: No   Drug use: Not Currently   Sexual activity: Not Currently  Other Topics Concern   Not on file  Social History Narrative   Not on file   Social Determinants of Health   Financial Resource Strain: Low Risk  (09/12/2021)   Overall Financial Resource Strain (CARDIA)    Difficulty of Paying Living Expenses: Not very hard  Food Insecurity: No Food Insecurity (09/12/2021)   Hunger Vital Sign    Worried About Running Out of Food in the Last Year: Never true    [place] in the Last Year: Never true  Transportation Needs: No Transportation Needs (09/12/2021)   PRAPARE - Hydrologist (Medical): No    Lack of Transportation (Non-Medical): No  Physical Activity: Sufficiently Active (09/12/2021)   Exercise Vital Sign    Days of Exercise per Week: 5 days    Minutes of Exercise per Session: 30 min  Stress: No Stress Concern Present (09/12/2021)   [place]    Feeling of Stress : Not at all  Social Connections: Moderately Integrated (09/12/2021)   Social Connection and Isolation Panel [NHANES]    Frequency of Communication with Friends and Family: More than three times a week    Frequency of Social Gatherings with Friends and Family: More than three times a week    Attends Religious Services: 1 to 4 times per year    Active Member of [employer] or Organizations: Yes    Attends Archivist Meetings: Never    Marital Status: Widowed      Family History: The patient's family history includes Heart disease in her brother; Heart failure in her mother; Lung cancer in  her brother; Parkinson's disease in her father. There is no history of Colon cancer, Colon polyps, or Esophageal cancer.  ROS:   Please see the history of present illness.    All other systems reviewed and are negative.  EKGs/Labs/Other Studies Reviewed:    The following studies were reviewed today:  2D echo 07/06/20  1. Left ventricular ejection fraction, by estimation, is 50 to 55%. The  left ventricle has low normal function. The left ventricle demonstrates  regional wall motion abnormalities (see scoring diagram/findings for  description). Apical hypokinesis. There  is mild left ventricular hypertrophy. Left ventricular diastolic  parameters are indeterminate.   2. Right ventricular systolic function is normal. The right ventricular  size is mildly enlarged. There is moderately elevated pulmonary artery  systolic pressure. The estimated right ventricular systolic pressure is  35.5 mmHg.   3. The mitral valve is abnormal. Trivial mitral valve regurgitation.  Severe mitral annular calcification.   4. The aortic valve has been repaired/replaced. There is a 23 mm Edwards  Sapien prosthetic, stented (TAVR) valve present in the aortic position.  Aortic valve regurgitation is mild to moderate. Mean gradient 8 mmHg,  stable from prior echo 12/08/19   5. The inferior vena cava is dilated in size with <50% respiratory  variability, suggesting right atrial pressure of 15 mmHg.     ______________________   Cardiac CT 08/01/2020 IMPRESSION: 1. Post TAVR with 23 mm Sapien 3 valve. Mechanism of AR would appear to be central valvular AR and not peri valvular or supra skirtal The is hypo attenuation and thickening of the leaflets at the   Basal commissures with central area of mal-coaptation The stent valve itself dose not appear to be over expanded and was not  post dilated during the procedure   2. Coronary arteries reside above/at upper edge of stent valve with good lumen   3.  Pacing wires noted in RA/RV   4.  Normal aortic root 2.8 cm   5. Normal LV size and function with abnormal septal motion from pacing suggesting that LV dilatation is not cause of AR   [place]     ______________________  Echo July 2022:  1. Left ventricular ejection fraction, by estimation, is 55 to 60%. The  left ventricle has normal function. The left ventricle has no regional  wall motion abnormalities. Left ventricular diastolic parameters are  consistent with Grade I diastolic  dysfunction (impaired relaxation). Elevated left atrial pressure.   2. Right ventricular systolic function is normal. The right ventricular  size is normal. There is mildly elevated pulmonary artery systolic  pressure. The estimated right ventricular systolic pressure is 73.2 mmHg.   3. The mitral valve is degenerative. Mild mitral valve regurgitation.  Moderate mitral annular calcification.   4. The aortic valve has been repaired/replaced. Aortic valve  regurgitation is mild. There is a 23 mm Sapien prosthetic (TAVR) valve  present in the aortic position. Aortic valve mean gradient measures 4.5  mmHg. Aortic valve Vmax measures 1.47 m/s.   5. The inferior vena cava is dilated in size with >50% respiratory  variability, suggesting right atrial pressure of 8 mmHg.     EKG:  EKG is NOT ordered today.    Recent Labs: 09/02/2021: ALT 8; BUN 18; Creatinine, Ser 0.83; Hemoglobin 15.4; Platelets 145; Potassium 4.1; Sodium 139; TSH 3.240  Recent Lipid Panel    Component Value Date/Time   CHOL 143 09/02/2021 0901   TRIG 88 09/02/2021 0901   HDL 48  09/02/2021 0901   CHOLHDL 3.0 09/02/2021 0901   LDLCALC 78 09/02/2021 0901     Risk Assessment/Calculations:       Physical Exam:    VS:  BP (!) 125/48   Pulse 73   Ht '5\' 5"'$  (1.651 m)   Wt 108 lb (49 kg)   SpO2 95%   BMI  17.97 kg/m     Wt Readings from Last 3 Encounters:  10/31/21 108 lb (49 kg)  09/12/21 111 lb (50.3 kg)  08/07/21 112 lb (50.8 kg)     GEN:  Well nourished, well developed in no acute distress, thin, chronically ill appearing. HEENT: Normal NECK: No JVD LYMPHATICS: No lymphadenopathy CARDIAC: RRR, no murmurs, rubs, gallops RESPIRATORY:  Clear to auscultation without rales, wheezing or rhonchi  ABDOMEN: Soft, non-tender, non-distended MUSCULOSKELETAL:  Trace pedal edema; No deformity  SKIN: Warm and dry NEUROLOGIC:  Alert and oriented x 3 PSYCHIATRIC:  Normal affect   ASSESSMENT:    1. S/P TAVR (transcatheter aortic valve replacement)   2. Chronic diastolic CHF (congestive heart failure) ([place])   3. Pulmonary hypertension, unspecified ([place])   4. History of pacemaker   5. Chronic obstructive pulmonary disease, unspecified COPD type ([place])     PLAN:    In order of problems listed above:  S/p TAVR with central AI: cardiac CT showed central AI with hypo-attenuation and leaflet thickening. Given that she was not a good anticoagulation candidate with recent GI bleed and leaflet thickening can come and go, it was felt best to continue observation with serial echos. Repeat echo 10/09/20 with LVEF=55-60%, mild MR, mld AI. Due for a repeat echo now. Will get this set up.   Chronic diastolic CHF: overall appears euvolemic. Continue lasix '20mg'$  daily.   Pulm HTN: RVSP 41.68m hg on last echo. Continue to monitor.   S/p PPM: followed by Dr. KCaryl Comes  COPD: now followed by Dr. BLamonte Sakai Still smoking but down to 5 cigs  day.   Medication Adjustments/Labs and Tests Ordered: Current medicines are reviewed at length with the patient today.  Concerns regarding medicines are outlined above.  Orders Placed This Encounter  Procedures   EKG 12-Lead   ECHOCARDIOGRAM COMPLETE   Meds ordered this encounter  Medications   amLODipine (NORVASC) 10 MG tablet    Sig: Take 1 tablet (10 mg total) by mouth  daily. Please schedule appointment for future refills. Thank you    Dispense:  90 tablet    Refill:  3    Patient Instructions  Medication Instructions:  Your physician recommends that you continue on your current medications as directed. Please refer to the Current Medication list given to you today.  *If you need a refill on your cardiac medications before your next appointment, please call your pharmacy*   Lab Work: NONE If you have labs (blood work) drawn today and your tests are completely normal, you will receive your results only by: [place](if you have MyChart) OR A paper copy in the mail If you have any lab test that is abnormal or we need to change your treatment, we will call you to review the results.   Testing/Procedures: Your physician has requested that you have an echocardiogram. Echocardiography is a painless test that uses sound waves to create images of your heart. It provides your doctor with information about the size and shape of your heart and how well your heart's chambers and valves are working. This procedure takes approximately one hour. There are  no restrictions for this procedure.    Follow-Up: At [facility], you and your health needs are our priority.  As part of our continuing mission to provide you with exceptional heart care, we have created designated Provider Care Teams.  These Care Teams include your primary Cardiologist (physician) and Advanced Practice Providers (APPs -  Physician Assistants and Nurse Practitioners) who all work together to provide you with the care you need, when you need it.  We recommend signing up for the patient portal called "MyChart".  Sign up information is provided on this After Visit Summary.  MyChart is used to connect with patients for Virtual Visits (Telemedicine).  Patients are able to view lab/test results, encounter notes, upcoming appointments, etc.  Non-urgent messages can be sent to your provider as well.    To learn more about what you can do with MyChart, go to [email or web address].    Your next appointment:   1 year(s)  The format for your next appointment:   In Person  Provider:   Lauree Chandler, MD{   Important Information About Sugar         Signed, Angelena Form, PA-C  10/31/2021 4:40 PM    [place]

## 2021-11-07 ENCOUNTER — Other Ambulatory Visit: Payer: Self-pay | Admitting: Emergency Medicine

## 2021-11-18 ENCOUNTER — Ambulatory Visit (HOSPITAL_COMMUNITY): Payer: Medicare HMO

## 2021-11-19 NOTE — Progress Notes (Signed)
N  Remote pacemaker transmission.

## 2021-11-26 ENCOUNTER — Ambulatory Visit (HOSPITAL_COMMUNITY): Payer: Medicare HMO | Attending: Cardiovascular Disease

## 2021-11-26 DIAGNOSIS — Z952 Presence of prosthetic heart valve: Secondary | ICD-10-CM | POA: Insufficient documentation

## 2021-11-26 LAB — ECHOCARDIOGRAM COMPLETE
AR max vel: 0.87 cm2
AV Area VTI: 0.89 cm2
AV Area mean vel: 0.86 cm2
AV Mean grad: 7 mmHg
AV Peak grad: 12 mmHg
Ao pk vel: 1.73 m/s
Area-P 1/2: 2.76 cm2
P 1/2 time: 431 msec
S' Lateral: 2.4 cm

## 2022-01-06 DIAGNOSIS — I4729 Other ventricular tachycardia: Secondary | ICD-10-CM | POA: Insufficient documentation

## 2022-01-07 ENCOUNTER — Ambulatory Visit: Payer: Medicare HMO | Attending: Internal Medicine | Admitting: Internal Medicine

## 2022-01-07 ENCOUNTER — Encounter: Payer: Self-pay | Admitting: Internal Medicine

## 2022-01-07 ENCOUNTER — Telehealth: Payer: Self-pay | Admitting: Emergency Medicine

## 2022-01-07 VITALS — BP 138/72 | HR 70 | Ht 65.0 in | Wt 107.2 lb

## 2022-01-07 DIAGNOSIS — I4729 Other ventricular tachycardia: Secondary | ICD-10-CM | POA: Diagnosis not present

## 2022-01-07 DIAGNOSIS — I442 Atrioventricular block, complete: Secondary | ICD-10-CM

## 2022-01-07 DIAGNOSIS — Z950 Presence of cardiac pacemaker: Secondary | ICD-10-CM

## 2022-01-07 DIAGNOSIS — I5032 Chronic diastolic (congestive) heart failure: Secondary | ICD-10-CM

## 2022-01-07 NOTE — Telephone Encounter (Signed)
Please get her in to be seen by APP or RB, whichever we can arrange first

## 2022-01-07 NOTE — Progress Notes (Signed)
[facility] patient ID: [name], female   DOB: 1935/09/30, 86 y.o.   MRN: [id number]      Patient Care Team: Ronnell Freshwater, NP as PCP - General (Family Medicine) Deboraha Sprang, MD as PCP - Electrophysiology (Cardiology) Burnell Blanks, MD as PCP - Cardiology (Cardiology)   HPI  [name] is a 86 y.o. female Seen in followup for Medtronic  pacemaker implanted 7/20 for syncope and high grade heart block  Severe AS and s/p TAVR 9/20   Much improved following pacing and TAVR no other syncope  Dyspnea with exertion intermittent.  3 pillow orthopnea.  No peripheral edema.  No chest pains.  Known COPD from longstanding smoking.  DATE TEST EF   5/22 CTA    % No obstructive CAD  8/23 Echo  55% RV dysfunction mild TAVR         Date Cr K Hgb  6/23 0.83 4.1 15.4          She is here to be Past Medical History:  Diagnosis Date   Anxiety    Stable   ASCUS on Pap smear 11/15/2009   Carotid artery disease (HCC)    1-39% RICA stenosis and 29-19% LICA stenosis on pre TAVR dopplers    Cataract    COPD (chronic obstructive pulmonary disease) (HCC)    Depression    Duodenal ulcer due to Helicobacter pylori 16/60/6004   Endometrial polyp 11/15/2009   Endometrioid adenocarcinoma    H/O varicella    Heart murmur    Hemorrhoids    History of measles, mumps, or rubella    Hypercholesterolemia    Hypertension    Menopausal symptoms 06/13/2010   Ovarian cyst 11/15/2009   PMB (postmenopausal bleeding) 11/15/2009   Presence of permanent cardiac pacemaker 10/20/2018   Vaginal bleeding     Past Surgical History:  Procedure Laterality Date   BIOPSY  06/19/2020   Procedure: BIOPSY;  Surgeon: Irving Copas., MD;  [facility] END[facility]COPY;  Service: Gastroenterology;;   BREAST SURGERY     Over twenty - five  years ago   CATARACT EXTRACTION W/ INTRAOCULAR LENS IMPLANT Bilateral    CERVICAL BIOPSY     ESOPHAGOGASTRODUODENOSCOPY (EGD) WITH PROPOFOL N/A 06/19/2020    Procedure: ESOPHAGOGASTRODUODENOSCOPY (EGD) WITH PROPOFOL;  Surgeon: Irving Copas., MD;  Location: [facility] ENDOSCOPY;  Service: Gastroenterology;  Laterality: N/A;   EYE SURGERY     INSERT / REPLACE / REMOVE PACEMAKER  10/20/2018   LAPAROSCOPIC ASSISTED VAGINAL HYSTERECTOMY  12/2009   BSO   MULTIPLE EXTRACTIONS WITH ALVEOLOPLASTY N/A 12/09/2018   Procedure: EXTRACTION OF TOOTH #'S 6,21-29 AND 32 WITH ALVEOLOPLASTY AND BILATERAL MANDIBULAR TORI REDUCTIONS;  Surgeon: Lenn Cal, DDS;  Location: [place];  Service: Oral Surgery;  Laterality: N/A;   PACEMAKER IMPLANT N/A 10/20/2018   Procedure: PACEMAKER IMPLANT;  Surgeon: Deboraha Sprang, MD;  Location: [place] CV LAB;  Service: Cardiovascular;  Laterality: N/A;   RIGHT/LEFT HEART CATH AND CORONARY ANGIOGRAPHY N/A 11/24/2018   Procedure: RIGHT/LEFT HEART CATH AND CORONARY ANGIOGRAPHY;  Surgeon: Burnell Blanks, MD;  Location: [place] CV LAB;  Service: Cardiovascular;  Laterality: N/A;   TEE WITHOUT CARDIOVERSION N/A 12/21/2018   Procedure: TRANSESOPHAGEAL ECHOCARDIOGRAM (TEE);  Surgeon: Burnell Blanks, MD;  Location: [place];  Service: Open Heart Surgery;  Laterality: N/A;    No outpatient medications have been marked as taking for the 01/07/22 encounter (Office Visit) with Deboraha Sprang, MD.  Medications were  reviewed   No Known Allergies   Review of Systems negative except from HPI and PMH  Physical Exam: BP 138/72   Pulse 70   Ht _0  (1.651 m)   Wt 107 lb 3.2 oz (48.6 kg)   SpO2 93%   BMI 17.84 kg/m   Well developed and cachectic in no acute distress HENT normal Neck supple with JVP-flat He creased breath sounds Device pocket well healed; without hematoma or erythema.  There is no tethering  Regular rate and rhythm, no  gallop No / murmur Abd-soft with active BS No Clubbing cyanosis  edema Skin-warm and dry A & Oriented  Grossly normal sensory and motor function  ECG sinus P-synchronous/  AV  pacing @ 70    EKG: Sinus with P synchronous pacing at 71 Interval 24/16/45   Assessment and  Plan  Complete heart block  AS  S/p TAVR  Pacemaker Medtronic    HFpEF chronic  Ventricular tachycardia  Nonsustained  During TAVR procedure   COPD  Hypertension  Functional status relatively stable.  Her pulmonary issues seem to be the most problematic.  She has been lost to follow-up with pulmonary, I have reached out to Dr. Lamonte Sakai to see if we can reconnect them.  Device function is normal.  She is euvolemic.  Continue furosemide 20 mg daily  Blood pressure is on the high side.  We will continue her amlodipine 10.

## 2022-01-09 NOTE — Telephone Encounter (Signed)
Scheduled for OV on 01/23/22. Nothing further needed at this time.

## 2022-01-13 ENCOUNTER — Ambulatory Visit: Payer: Medicare HMO | Admitting: Nurse Practitioner

## 2022-01-16 ENCOUNTER — Ambulatory Visit (INDEPENDENT_AMBULATORY_CARE_PROVIDER_SITE_OTHER): Payer: Medicare HMO | Admitting: Nurse Practitioner

## 2022-01-16 ENCOUNTER — Encounter: Payer: Self-pay | Admitting: Nurse Practitioner

## 2022-01-16 VITALS — BP 122/70 | HR 70 | Ht 65.0 in | Wt 105.1 lb

## 2022-01-16 DIAGNOSIS — F411 Generalized anxiety disorder: Secondary | ICD-10-CM

## 2022-01-16 DIAGNOSIS — I442 Atrioventricular block, complete: Secondary | ICD-10-CM | POA: Diagnosis not present

## 2022-01-16 DIAGNOSIS — I10 Essential (primary) hypertension: Secondary | ICD-10-CM | POA: Diagnosis not present

## 2022-01-16 DIAGNOSIS — J449 Chronic obstructive pulmonary disease, unspecified: Secondary | ICD-10-CM

## 2022-01-16 DIAGNOSIS — L409 Psoriasis, unspecified: Secondary | ICD-10-CM

## 2022-01-16 MED ORDER — CLOBETASOL PROPIONATE 0.05 % EX CREA: 1.0000 | TOPICAL_CREAM | Freq: Two times a day (BID) | CUTANEOUS | 2 refills | Status: DC

## 2022-01-16 MED ORDER — CITALOPRAM HYDROBROMIDE 40 MG PO TABS: 20.0000 mg | ORAL_TABLET | Freq: Every day | ORAL | 1 refills | Status: DC

## 2022-01-16 MED ORDER — HYDROXYZINE PAMOATE 50 MG PO CAPS
50.0000 mg | ORAL_CAPSULE | Freq: Three times a day (TID) | ORAL | 1 refills | Status: DC | PRN
Start: 2022-01-16 — End: 2023-01-22

## 2022-01-16 NOTE — Progress Notes (Signed)
Established patient visit   Patient: [name]   DOB: Feb 11, 1936   86 y.o. Female  MRN: [id number] Visit Date: 01/16/2022   Chief Complaint  Patient presents with   Anxiety   Subjective    HPI  Follow up  -GAD  -takes celexa everyday  -has some increased personal stress recently, but is doing well overall.  -states that she wishes she had a little more energy.  -has more energy in the afternoons, but hard to get going in the mornings.  History of complete heart block. Sees EP specialist.  -aortic valve replacement  -saw Dr. Caryl Comes last week.  -will be going back to see her pulmonologist  -has cut back to 5 cigarettes per day.  -conitnues to use rescue inhaler as needed. Has run out of anoro inhaler. Expects to be put back on this at her visit on 01/23/2022.      Medications: Outpatient Medications Prior to Visit  Medication Sig   acetaminophen (TYLENOL) 325 MG tablet Take 2 tablets (650 mg total) by mouth every 6 (six) hours as needed for moderate pain.   albuterol (VENTOLIN HFA) 108 (90 Base) MCG/ACT inhaler Inhale 2 puffs into the lungs every 4 (four) hours as needed for wheezing or shortness of breath.   amLODipine (NORVASC) 10 MG tablet Take 1 tablet (10 mg total) by mouth daily. Please schedule appointment for future refills. Thank you   aspirin 81 MG chewable tablet Chew 1 tablet (81 mg total) by mouth daily.   carboxymethylcellulose (REFRESH PLUS) 0.5 % SOLN Place 1 drop into both eyes daily as needed (dry eyes).   Cholecalciferol (VITAMIN D-3) 125 MCG (5000 UT) TABS Take 5,000 Units by mouth daily.   furosemide (LASIX) 20 MG tablet Take 1 tablet by mouth once daily   Omega-3 Fatty Acids (FISH OIL) 1000 MG CAPS Take 1,000 mg by mouth daily.   pantoprazole (PROTONIX) 20 MG tablet Take 1 tablet by mouth once daily   simvastatin (ZOCOR) 20 MG tablet Take 1 tablet (20 mg total) by mouth daily.   vitamin B-12 (CYANOCOBALAMIN) 500 MCG tablet Take 500 mcg by mouth daily.    vitamin E 180 MG (400 UNITS) capsule Take 400 Units by mouth daily.   [DISCONTINUED] ANORO ELLIPTA 62.5-25 MCG/ACT AEPB Inhale 1 puff by mouth once daily   [DISCONTINUED] citalopram (CELEXA) 40 MG tablet Take 0.5 tablets (20 mg total) by mouth daily.   [DISCONTINUED] hydrOXYzine (VISTARIL) 50 MG capsule Take 1 capsule (50 mg total) by mouth 3 (three) times daily as needed for anxiety.   [DISCONTINUED] Tiotropium Bromide-Olodaterol (STIOLTO RESPIMAT) 2.5-2.5 MCG/ACT AERS Inhale 2 puffs into the lungs daily.   No facility-administered medications prior to visit.    Review of Systems  Constitutional:  Negative for activity change, appetite change, chills, fatigue and fever.  HENT:  Negative for congestion, postnasal drip, rhinorrhea, sinus pressure, sinus pain, sneezing and sore throat.   Eyes: Negative.   Respiratory:  Positive for shortness of breath. Negative for cough, chest tightness and wheezing.   Cardiovascular:  Negative for chest pain and palpitations.  Gastrointestinal:  Negative for abdominal pain, constipation, diarrhea, nausea and vomiting.  Endocrine: Negative for cold intolerance, heat intolerance, polydipsia and polyuria.  Genitourinary:  Negative for dyspareunia, dysuria, flank pain, frequency and urgency.  Musculoskeletal:  Negative for arthralgias, back pain and myalgias.  Skin:  Negative for rash.  Allergic/Immunologic: Negative for environmental allergies.  Neurological:  Negative for dizziness, weakness and headaches.  Hematological:  Negative  for adenopathy.  Psychiatric/Behavioral:  Positive for sleep disturbance. The patient is nervous/anxious.      Objective     Today's Vitals   01/16/22 1601  BP: 122/70  Pulse: 70  SpO2: 98%  Weight: 105 lb 1.9 oz (47.7 kg)  Height: '5\' 5"'$  (1.651 m)   Body mass index is 17.49 kg/m.  BP Readings from Last 3 Encounters:  01/23/22 120/60  01/16/22 122/70  01/07/22 138/72    Wt Readings from Last 3 Encounters:   01/23/22 105 lb 3.2 oz (47.7 kg)  01/16/22 105 lb 1.9 oz (47.7 kg)  01/07/22 107 lb 3.2 oz (48.6 kg)    Physical Exam Vitals and nursing note reviewed.  Constitutional:      Appearance: Normal appearance. She is well-developed.  HENT:     Head: Normocephalic and atraumatic.     Nose: Nose normal.     Mouth/Throat:     Mouth: Mucous membranes are moist.     Pharynx: Oropharynx is clear.  Eyes:     Extraocular Movements: Extraocular movements intact.     Conjunctiva/sclera: Conjunctivae normal.     Pupils: Pupils are equal, round, and reactive to light.  Cardiovascular:     Rate and Rhythm: Normal rate. Rhythm irregular.     Pulses: Normal pulses.     Heart sounds: Murmur heard.  Pulmonary:     Effort: Pulmonary effort is normal.     Breath sounds: Normal breath sounds.  Abdominal:     Palpations: Abdomen is soft.  Musculoskeletal:        General: Normal range of motion.     Cervical back: Normal range of motion and neck supple.  Lymphadenopathy:     Cervical: No cervical adenopathy.  Skin:    General: Skin is warm and dry.     Capillary Refill: Capillary refill takes less than 2 seconds.  Neurological:     General: No focal deficit present.     Mental Status: She is alert and oriented to person, place, and time.  Psychiatric:        Attention and Perception: Attention and perception normal.        Mood and Affect: Affect normal. Mood is anxious.        Speech: Speech normal.        Behavior: Behavior normal.        Thought Content: Thought content normal.        Cognition and Memory: Cognition and memory normal.        Judgment: Judgment normal.      Assessment & Plan    1. Scalp psoriasis May use clobetasol shampoo twice weekly as needed.  - clobetasol cream (TEMOVATE) 0.05 %; Apply 1 Application topically 2 (two) times daily.  Dispense: 30 g; Refill: 2  2. Generalized anxiety disorder Continue citalopram 20 mg daily. May also take hydroxyzine 50 mg  up to   three times daily as needed for acute  anxiety.  - hydrOXYzine (VISTARIL) 50 MG capsule; Take 1 capsule (50 mg total) by mouth 3 (three) times daily as needed for anxiety.  Dispense: 180 capsule; Refill: 1 - citalopram (CELEXA) 40 MG tablet; Take 0.5 tablets (20 mg total) by mouth daily.  Dispense: 90 tablet; Refill: 1  3. Complete heart block ([place]) Continue regular visits with cardiology and EP specialists as scheduled    4. Primary hypertension Stable. Continue bp medication as prescribed   5. Chronic obstructive pulmonary disease, unspecified COPD type ([place]) Upcoming appointment  with pulmonologist.     Problem List Items Addressed This Visit       Cardiovascular and Mediastinum   Complete heart block (HCC)   HTN (hypertension)     Respiratory   COPD (chronic obstructive pulmonary disease) (HCC)     Musculoskeletal and Integument   Scalp psoriasis - Primary   Relevant Medications   clobetasol cream (TEMOVATE) 0.05 %     Other   Generalized anxiety disorder   Relevant Medications   hydrOXYzine (VISTARIL) 50 MG capsule   citalopram (CELEXA) 40 MG tablet     Return in about 6 months (around 07/18/2022) for mood.         Ronnell Freshwater, NP  [facility] [phone] (phone) [phone] (fax)  [place]

## 2022-01-21 ENCOUNTER — Ambulatory Visit (INDEPENDENT_AMBULATORY_CARE_PROVIDER_SITE_OTHER): Payer: Medicare HMO

## 2022-01-21 DIAGNOSIS — I442 Atrioventricular block, complete: Secondary | ICD-10-CM | POA: Diagnosis not present

## 2022-01-21 LAB — CUP PACEART REMOTE DEVICE CHECK
Battery Remaining Longevity: 118 mo
Battery Voltage: 3 V
Brady Statistic AP VP Percent: 0.95 %
Brady Statistic AP VS Percent: 0 %
Brady Statistic AS VP Percent: 98.98 %
Brady Statistic AS VS Percent: 0.07 %
Brady Statistic RA Percent Paced: 0.96 %
Brady Statistic RV Percent Paced: 99.93 %
Date Time Interrogation Session: 20231024034904
Implantable Lead Connection Status: 753985
Implantable Lead Connection Status: 753985
Implantable Lead Implant Date: 20200722
Implantable Lead Implant Date: 20200722
Implantable Lead Location: 753859
Implantable Lead Location: 753860
Implantable Lead Model: 1944
Implantable Lead Model: 1948
Implantable Pulse Generator Implant Date: 20200722
Lead Channel Impedance Value: 399 Ohm
Lead Channel Impedance Value: 437 Ohm
Lead Channel Impedance Value: 513 Ohm
Lead Channel Impedance Value: 627 Ohm
Lead Channel Pacing Threshold Amplitude: 0.5 V
Lead Channel Pacing Threshold Amplitude: 0.625 V
Lead Channel Pacing Threshold Pulse Width: 0.4 ms
Lead Channel Pacing Threshold Pulse Width: 0.4 ms
Lead Channel Sensing Intrinsic Amplitude: 2.25 mV
Lead Channel Sensing Intrinsic Amplitude: 2.25 mV
Lead Channel Sensing Intrinsic Amplitude: 5 mV
Lead Channel Sensing Intrinsic Amplitude: 9.75 mV
Lead Channel Setting Pacing Amplitude: 1.5 V
Lead Channel Setting Pacing Amplitude: 2 V
Lead Channel Setting Pacing Pulse Width: 0.4 ms
Lead Channel Setting Sensing Sensitivity: 2 mV
Zone Setting Status: 755011
Zone Setting Status: 755011

## 2022-01-23 ENCOUNTER — Encounter: Payer: Self-pay | Admitting: Emergency Medicine

## 2022-01-23 ENCOUNTER — Ambulatory Visit (INDEPENDENT_AMBULATORY_CARE_PROVIDER_SITE_OTHER): Payer: Medicare HMO | Admitting: Emergency Medicine

## 2022-01-23 DIAGNOSIS — J449 Chronic obstructive pulmonary disease, unspecified: Secondary | ICD-10-CM | POA: Diagnosis not present

## 2022-01-23 MED ORDER — ANORO ELLIPTA 62.5-25 MCG/ACT IN AEPB: INHALATION_SPRAY | RESPIRATORY_TRACT | 1 refills | Status: DC

## 2022-01-23 NOTE — Progress Notes (Signed)
   Subjective:    Patient ID: [name], female    DOB: 10-04-1935, 86 y.o.   MRN: [id number]  HPI  ROV 01/23/2022 --[name] is 38 with a history of tobacco use (60+ pack years), CAD, third-degree AV block with a pacemaker, hypertension, TAVR for severe aortic stenosis (11/2018).  She has associated moderate pulmonary hypertension.  I seen her for COPD and moderately severe obstructive lung disease. Other PMH: Endometrial cancer, PUD She has been managed on Anoro, has albuterol She will get exertional SOB with heavier exertion, she is able to shop, walk. She went off her Anoro because we didn't have follow up and she is also wondering about the expense. She isn't sure it was helping her enough to merit the expense. She has clear sputum, coughs daily.  She is smoking 2 pks a week.     Review of Systems As per HPI     Objective:   Physical Exam Vitals:   01/23/22 1531  BP: 120/60  Pulse: 72  Temp: 97.9 F (36.6 C)  TempSrc: Oral  SpO2: 98%  Weight: 105 lb 3.2 oz (47.7 kg)  Height: '5\' 5"'$  (1.651 m)    Gen: Pleasant, very thin, in no distress,  normal affect  ENT: No lesions,  mouth clear,  oropharynx clear, no postnasal drip  Neck: No JVD, no stridor  Lungs: No use of accessory muscles, distant, no crackles or wheezing on normal respiration, she does wheeze on forced expiration  Cardiovascular: RRR, distant, 2/6 M, , no peripheral edema  Musculoskeletal: No deformities, no cyanosis or clubbing  Neuro: alert, awake, non focal  Skin: Warm, no lesions or rash      Assessment & Plan:  COPD (chronic obstructive pulmonary disease) (HCC) We will restart your Anoro 1 inhalation once daily.  Keep track of whether this medication helps your breathing.  If you do get benefit then we will continue it going forward. Keep your albuterol available to use 2 puffs when you needed for shortness of breath, chest tightness, wheezing. You would benefit from stopping smoking.  Please  think about cutting down.  We can try to help you with strategies to do so.  Ultimate goal would be to stop altogether. Follow with APP in 2 months to discuss Jimenez you are doing on the Anoro Follow with Dr. Lamonte Sakai in 12 months or sooner if you have any problems.   Baltazar Apo, MD, PhD 01/23/2022, 3:52 PM [facility] [phone] or if no answer before 7:00PM call [phone] For any issues after 7:00PM please call eLink [phone]

## 2022-01-23 NOTE — Patient Instructions (Signed)
We will restart your Anoro 1 inhalation once daily.  Keep track of whether this medication helps your breathing.  If you do get benefit then we will continue it going forward. Keep your albuterol available to use 2 puffs when you needed for shortness of breath, chest tightness, wheezing. You would benefit from stopping smoking.  Please think about cutting down.  We can try to help you with strategies to do so.  Ultimate goal would be to stop altogether. Follow with APP in 2 months to discuss how you are doing on the Anoro Follow with Dr. Lamonte Sakai in 12 months or sooner if you have any problems.

## 2022-01-23 NOTE — Addendum Note (Signed)
Addended by: Gavin Potters R on: 01/23/2022 04:02 PM   Modules accepted: Orders

## 2022-01-23 NOTE — Assessment & Plan Note (Signed)
We will restart your Anoro 1 inhalation once daily.  Keep track of whether this medication helps your breathing.  If you do get benefit then we will continue it going forward. Keep your albuterol available to use 2 puffs when you needed for shortness of breath, chest tightness, wheezing. You would benefit from stopping smoking.  Please think about cutting down.  We can try to help you with strategies to do so.  Ultimate goal would be to stop altogether. Follow with APP in 2 months to discuss how you are doing on the Anoro Follow with Dr. Lamonte Sakai in 12 months or sooner if you have any problems.

## 2022-02-02 DIAGNOSIS — L409 Psoriasis, unspecified: Secondary | ICD-10-CM | POA: Insufficient documentation

## 2022-02-07 ENCOUNTER — Other Ambulatory Visit: Payer: Self-pay | Admitting: Nurse Practitioner

## 2022-02-07 ENCOUNTER — Other Ambulatory Visit: Payer: Self-pay | Admitting: Cardiovascular Disease

## 2022-02-07 DIAGNOSIS — E782 Mixed hyperlipidemia: Secondary | ICD-10-CM

## 2022-02-10 NOTE — Progress Notes (Signed)
Remote pacemaker transmission.   

## 2022-03-07 ENCOUNTER — Other Ambulatory Visit: Payer: Self-pay | Admitting: Nurse Practitioner

## 2022-03-07 DIAGNOSIS — J449 Chronic obstructive pulmonary disease, unspecified: Secondary | ICD-10-CM

## 2022-03-26 ENCOUNTER — Other Ambulatory Visit: Payer: Self-pay | Admitting: Emergency Medicine

## 2022-03-27 ENCOUNTER — Encounter: Payer: Self-pay | Admitting: Nurse Practitioner

## 2022-03-27 ENCOUNTER — Ambulatory Visit (INDEPENDENT_AMBULATORY_CARE_PROVIDER_SITE_OTHER): Payer: Medicare HMO | Admitting: Nurse Practitioner

## 2022-03-27 VITALS — BP 102/68 | HR 68 | Ht 65.0 in | Wt 108.6 lb

## 2022-03-27 DIAGNOSIS — J449 Chronic obstructive pulmonary disease, unspecified: Secondary | ICD-10-CM | POA: Diagnosis not present

## 2022-03-27 NOTE — Assessment & Plan Note (Addendum)
Moderate COPD. Improved symptom burden with consistent use of Anoro. Compensated on current regimen. Activity encouraged, as tolerated. Advised her to notify us if she has any trouble with cost/affordability next year.   Patient Instructions  Continue Albuterol inhaler 2 puffs every 6 hours as needed for shortness of breath or wheezing. Notify if symptoms persist despite rescue inhaler/neb use.  Continue Anoro 1 puff daily  Follow up with Dr. Lamonte Sakai in October. If symptoms worsen, please contact office for sooner follow up or seek emergency care.

## 2022-03-27 NOTE — Patient Instructions (Addendum)
Continue Albuterol inhaler 2 puffs every 6 hours as needed for shortness of breath or wheezing. Notify if symptoms persist despite rescue inhaler/neb use.  Continue Anoro 1 puff daily  Follow up with Dr. Lamonte Sakai in October. If symptoms worsen, please contact office for sooner follow up or seek emergency care.

## 2022-03-27 NOTE — Progress Notes (Signed)
$'@Patient'H$  ID: [name], female    DOB: 10/31/1935, 86 y.o.   MRN: [id number]  Chief Complaint  Patient presents with   Follow-up    Pt f/u she is using Anoro and is feelingwell, has a cough but she reports it's always present    Referring provider: Ronnell Freshwater, NP  HPI: 86 year old female, active smoker followed for COPD. She is a patient of Dr. Agustina Caroli and last seen in office 01/23/2022. Past medical history significant for CAD, third degree AV block with pacemaker, HTN, TAVR s/p severe aortic stenosis, PAH, hx of endometrial cancer, HLD, anxiety.  TEST/EVENTS:  10/18/2020 PFT: FVC 82, FEV1 60, ratio 55, TLC 124, DLCOcor 61  01/23/2022: OV with Dr. Lamonte Sakai. Smoking 2 packs a week. She still is getting exertional SOB with heavier exertion. Able to shop and walk. Went off her Anoro d/t not having follow up and she's wondering about the expense. Daily cough with clear sputum. Encouraged her to restart Anoro and monitor if it helps.   03/27/2022: Today - follow up Patient presents today with her daughter for follow up. She feels like she has been doing better on Anoro. Notices that her activity tolerance is better. Still gets winded when walking uphill. Able to complete daily activities without difficulty. Cough is stable. No increased chest congestion or wheezing. Uses albuterol maybe once a day. She is a little concerned about cost of the inhaler and putting her in the donut hole. Will talk to her insurance agent about ensuring she has enough coverage next year. Still smoking.   No Known Allergies   There is no immunization history on file for this patient.  Past Medical History:  Diagnosis Date   Anxiety    Stable   ASCUS on Pap smear 11/15/2009   Carotid artery disease (HCC)    1-39% RICA stenosis and 81-15% LICA stenosis on pre TAVR dopplers    Cataract    COPD (chronic obstructive pulmonary disease) (HCC)    Depression    Duodenal ulcer due to Helicobacter pylori  72/62/0355   Endometrial polyp 11/15/2009   Endometrioid adenocarcinoma    H/O varicella    Heart murmur    Hemorrhoids    History of measles, mumps, or rubella    Hypercholesterolemia    Hypertension    Menopausal symptoms 06/13/2010   Ovarian cyst 11/15/2009   PMB (postmenopausal bleeding) 11/15/2009   Presence of permanent cardiac pacemaker 10/20/2018   Vaginal bleeding     Tobacco History: Social History   Tobacco Use  Smoking Status Every Day   Packs/day: 0.25   Years: 55.00   Total pack years: 13.75   Types: Cigarettes  Smokeless Tobacco Never  Tobacco Comments   5-6 cigarettes smoke daily HEI 03/27/2022   Ready to quit: Not Answered Counseling given: Not Answered Tobacco comments: 5-6 cigarettes smoke daily HEI 03/27/2022   Outpatient Medications Prior to Visit  Medication Sig Dispense Refill   acetaminophen (TYLENOL) 325 MG tablet Take 2 tablets (650 mg total) by mouth every 6 (six) hours as needed for moderate pain. 30 tablet 0   albuterol (VENTOLIN HFA) 108 (90 Base) MCG/ACT inhaler INHALE 2 PUFFS BY MOUTH EVERY 4 HOURS AS NEEDED FOR WHEEZING FOR SHORTNESS OF BREATH 18 g 0   amLODipine (NORVASC) 10 MG tablet Take 1 tablet (10 mg total) by mouth daily. Please schedule appointment for future refills. Thank you 90 tablet 3   aspirin 81 MG chewable tablet Chew 1 tablet (81 mg  total) by mouth daily.     carboxymethylcellulose (REFRESH PLUS) 0.5 % SOLN Place 1 drop into both eyes daily as needed (dry eyes).     Cholecalciferol (VITAMIN D-3) 125 MCG (5000 UT) TABS Take 5,000 Units by mouth daily.     citalopram (CELEXA) 40 MG tablet Take 0.5 tablets (20 mg total) by mouth daily. 90 tablet 1   clobetasol cream (TEMOVATE) 2.97 % Apply 1 Application topically 2 (two) times daily. 30 g 2   furosemide (LASIX) 20 MG tablet Take 1 tablet by mouth once daily 90 tablet 1   hydrOXYzine (VISTARIL) 50 MG capsule Take 1 capsule (50 mg total) by mouth 3 (three) times daily as needed  for anxiety. 180 capsule 1   Omega-3 Fatty Acids (FISH OIL) 1000 MG CAPS Take 1,000 mg by mouth daily.     pantoprazole (PROTONIX) 20 MG tablet Take 1 tablet by mouth once daily 90 tablet 0   simvastatin (ZOCOR) 20 MG tablet Take 1 tablet by mouth once daily 90 tablet 0   umeclidinium-vilanterol (ANORO ELLIPTA) 62.5-25 MCG/ACT AEPB Inhale 1 puff by mouth once daily 60 each 11   vitamin B-12 (CYANOCOBALAMIN) 500 MCG tablet Take 500 mcg by mouth daily.     vitamin E 180 MG (400 UNITS) capsule Take 400 Units by mouth daily.     No facility-administered medications prior to visit.     Review of Systems:   Constitutional: No weight loss or gain, night sweats, fevers, chills, fatigue, or lassitude. HEENT: No headaches, difficulty swallowing, tooth/dental problems, or sore throat. No sneezing, itching, ear ache, nasal congestion, or post nasal drip CV:  No chest pain, orthopnea, PND, swelling in lower extremities, anasarca, dizziness, palpitations, syncope Resp: +shortness of breath with exertion; daily minimally productive cough. No excess mucus or change in color of mucus.  No hemoptysis. No wheezing.  No chest wall deformity GI:  No heartburn, indigestion, abdominal pain, loss of appetite GU: No dysuria, change in color of urine, urgency or frequency.   Skin: No rash, lesions, ulcerations MSK:  No joint pain or swelling.   Neuro: No dizziness or lightheadedness.  Psych: No depression or anxiety. Mood stable.     Physical Exam:  BP 102/68   Pulse 68   Ht '5\' 5"'$  (1.651 m)   Wt 108 lb 9.6 oz (49.3 kg)   SpO2 95%   BMI 18.07 kg/m   GEN: Pleasant, interactive, well-appearing; in no acute distress. HEENT:  Normocephalic and atraumatic. PERRLA. Sclera white. Nasal turbinates pink, moist and patent bilaterally. No rhinorrhea present. Oropharynx pink and moist, without exudate or edema. No lesions, ulcerations, or postnasal drip.  NECK:  Supple w/ fair ROM. No JVD present. Normal carotid  impulses w/o bruits. Thyroid symmetrical with no goiter or nodules palpated. No lymphadenopathy.   CV: RRR, no m/r/g, no peripheral edema. Pulses intact, +2 bilaterally. No cyanosis, pallor or clubbing. PULMONARY:  Unlabored, regular breathing. Diminished bilaterally A&P w/o wheezes/rales/rhonchi. No accessory muscle use.  GI: BS present and normoactive. Soft, non-tender to palpation. No organomegaly or masses detected. MSK: No erythema, warmth or tenderness. Cap refil <2 sec all extrem. No deformities or joint swelling noted.  Neuro: A/Ox3. No focal deficits noted.   Skin: Warm, no lesions or rashe Psych: Normal affect and behavior. Judgement and thought content appropriate.     Lab Results:  CBC    Component Value Date/Time   WBC 5.1 09/02/2021 0901   WBC 5.4 10/30/2020 1604   RBC 5.09  09/02/2021 0901   RBC 4.46 10/30/2020 1604   HGB 15.4 09/02/2021 0901   HCT 45.5 09/02/2021 0901   PLT 145 (L) 09/02/2021 0901   MCV 89 09/02/2021 0901   MCH 30.3 09/02/2021 0901   MCH 30.1 07/06/2020 0308   MCHC 33.8 09/02/2021 0901   MCHC 33.4 10/30/2020 1604   RDW 13.5 09/02/2021 0901   LYMPHSABS 1.5 09/02/2021 0901   MONOABS 0.6 07/06/2020 0308   EOSABS 0.3 09/02/2021 0901   BASOSABS 0.0 09/02/2021 0901    BMET    Component Value Date/Time   NA 139 09/02/2021 0901   K 4.1 09/02/2021 0901   CL 101 09/02/2021 0901   CO2 22 09/02/2021 0901   GLUCOSE 86 09/02/2021 0901   GLUCOSE 118 (H) 07/04/2020 1319   BUN 18 09/02/2021 0901   CREATININE 0.83 09/02/2021 0901   CALCIUM 9.5 09/02/2021 0901   GFRNONAA >60 07/04/2020 1319   GFRAA 88 01/11/2019 1014    BNP    Component Value Date/Time   BNP 142.7 (H) 12/17/2018 1428     Imaging:  No results found.       Latest Ref Rng & Units 10/18/2020   11:51 AM  PFT Results  FVC-Pre L 2.17   FVC-Predicted Pre % 82   FVC-Post L 2.26   FVC-Predicted Post % 85   Pre FEV1/FVC % % 55   Post FEV1/FCV % % 55   FEV1-Pre L 1.19    FEV1-Predicted Pre % 60   FEV1-Post L 1.24   DLCO uncorrected ml/min/mmHg 11.88   DLCO UNC% % 61   DLCO corrected ml/min/mmHg 11.88   DLCO COR %Predicted % 61   DLVA Predicted % 67   TLC L 6.49   TLC % Predicted % 124   RV % Predicted % 185     No results found for: "NITRICOXIDE"      Assessment & Plan:   COPD (chronic obstructive pulmonary disease) (HCC) Moderate COPD. Improved symptom burden with consistent use of Anoro. Compensated on current regimen. Activity encouraged, as tolerated. Advised her to notify us if she has any trouble with cost/affordability next year.   Patient Instructions  Continue Albuterol inhaler 2 puffs every 6 hours as needed for shortness of breath or wheezing. Notify if symptoms persist despite rescue inhaler/neb use.  Continue Anoro 1 puff daily  Follow up with Dr. Lamonte Sakai in October. If symptoms worsen, please contact office for sooner follow up or seek emergency care.    I spent 28 minutes of dedicated to the care of this patient on the date of this encounter to include pre-visit review of records, face-to-face time with the patient discussing conditions above, post visit ordering of testing, clinical documentation with the electronic health record, making appropriate referrals as documented, and communicating necessary findings to members of the patients care team.  Clayton Bibles, NP 03/27/2022  Pt aware and understands NP's role.

## 2022-04-03 IMAGING — DX DG CHEST 1V
1 series · 1 of 1 positions shown · non-contrast
Comparison: 12/21/2018

CLINICAL DATA: Black tarry stool.

EXAM:
CHEST  1 VIEW

[chest ap]
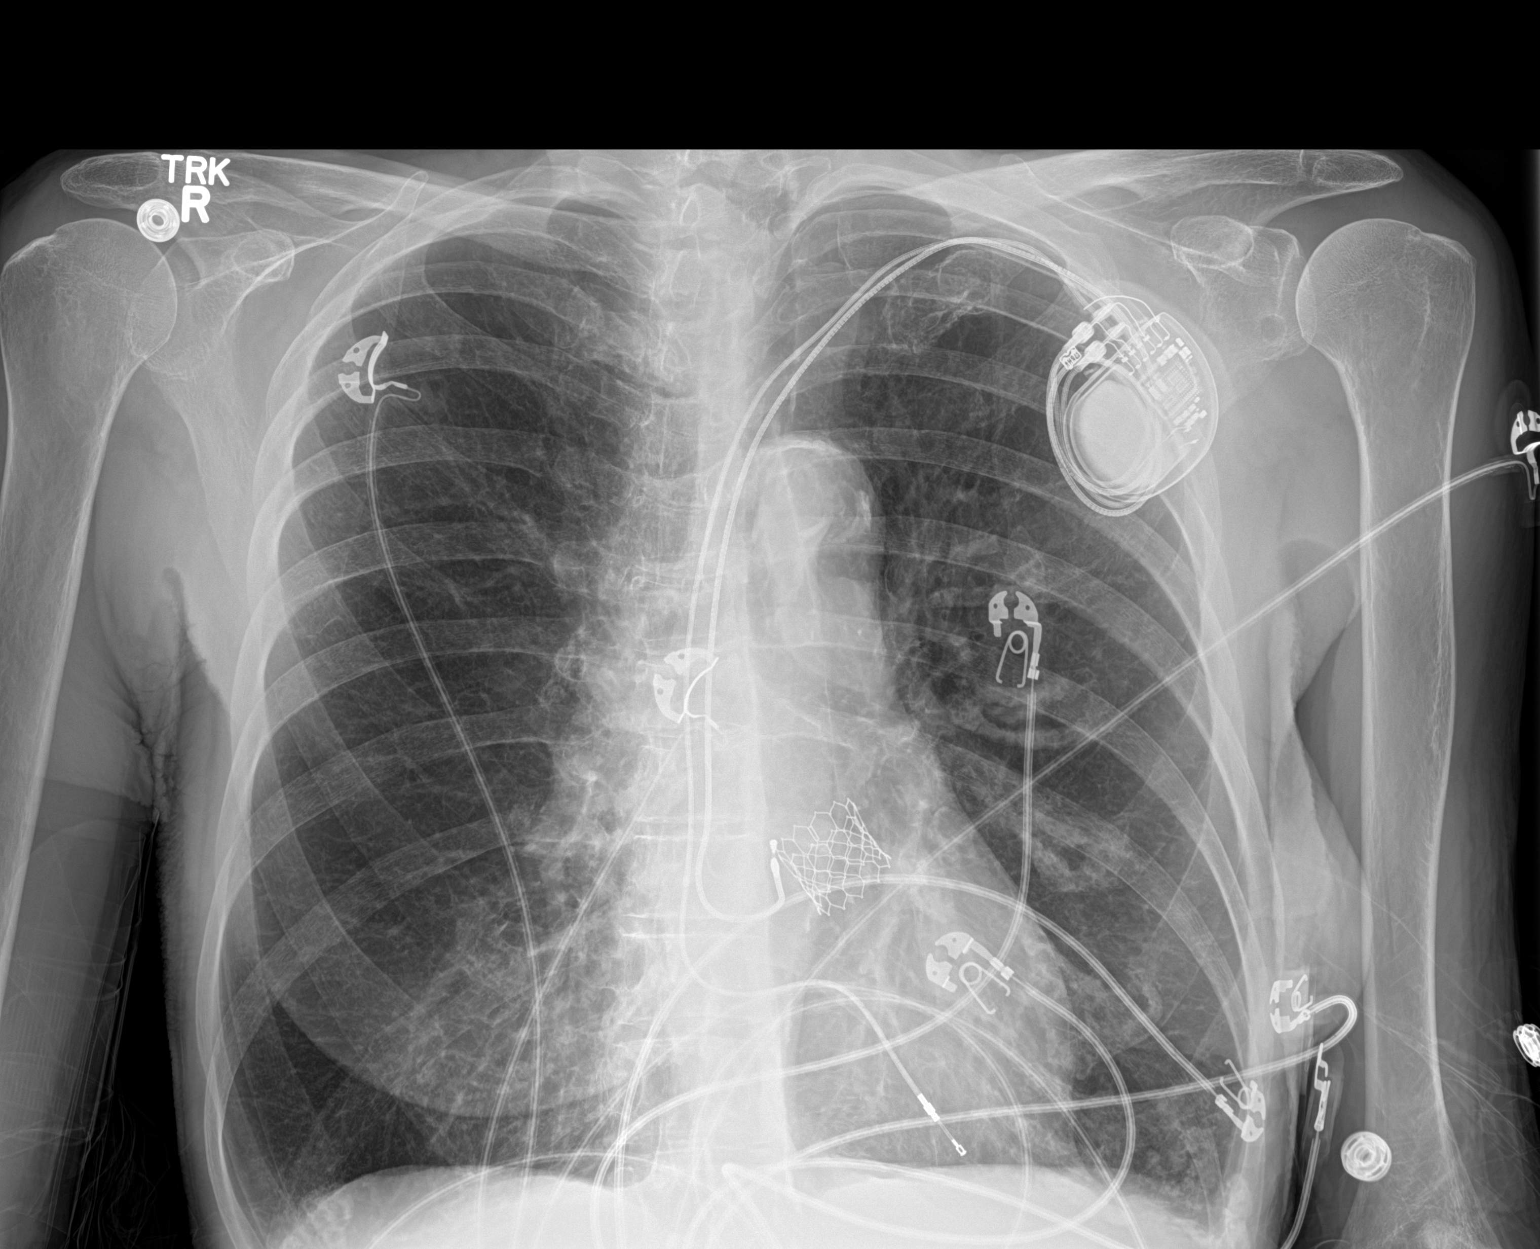

[1 of 1 positions shown; findings below may reference images not displayed]

FINDINGS: Left chest wall pacer device is noted with leads in the right atrial
appendage and right ventricle. Heart size appears normal. Aortic
atherosclerosis. No pleural effusion or edema. No airspace opacities
identified. The visualized osseous structures are unremarkable.
IMPRESSION: No active cardiopulmonary abnormalities.

## 2022-04-22 ENCOUNTER — Ambulatory Visit: Payer: Medicare HMO | Attending: Internal Medicine

## 2022-04-22 DIAGNOSIS — I442 Atrioventricular block, complete: Secondary | ICD-10-CM

## 2022-04-22 LAB — CUP PACEART REMOTE DEVICE CHECK
Battery Remaining Longevity: 115 mo
Battery Voltage: 3 V
Brady Statistic AP VP Percent: 0.85 %
Brady Statistic AP VS Percent: 0 %
Brady Statistic AS VP Percent: 99.08 %
Brady Statistic AS VS Percent: 0.07 %
Brady Statistic RA Percent Paced: 0.86 %
Brady Statistic RV Percent Paced: 99.93 %
Date Time Interrogation Session: 20240122194044
Implantable Lead Connection Status: 753985
Implantable Lead Connection Status: 753985
Implantable Lead Implant Date: 20200722
Implantable Lead Implant Date: 20200722
Implantable Lead Location: 753859
Implantable Lead Location: 753860
Implantable Lead Model: 1944
Implantable Lead Model: 1948
Implantable Pulse Generator Implant Date: 20200722
Lead Channel Impedance Value: 399 Ohm
Lead Channel Impedance Value: 437 Ohm
Lead Channel Impedance Value: 532 Ohm
Lead Channel Impedance Value: 646 Ohm
Lead Channel Pacing Threshold Amplitude: 0.5 V
Lead Channel Pacing Threshold Amplitude: 0.5 V
Lead Channel Pacing Threshold Pulse Width: 0.4 ms
Lead Channel Pacing Threshold Pulse Width: 0.4 ms
Lead Channel Sensing Intrinsic Amplitude: 2.625 mV
Lead Channel Sensing Intrinsic Amplitude: 2.625 mV
Lead Channel Sensing Intrinsic Amplitude: 5 mV
Lead Channel Sensing Intrinsic Amplitude: 9.75 mV
Lead Channel Setting Pacing Amplitude: 1.5 V
Lead Channel Setting Pacing Amplitude: 2 V
Lead Channel Setting Pacing Pulse Width: 0.4 ms
Lead Channel Setting Sensing Sensitivity: 2 mV
Zone Setting Status: 755011
Zone Setting Status: 755011

## 2022-05-04 ENCOUNTER — Other Ambulatory Visit: Payer: Self-pay | Admitting: Nurse Practitioner

## 2022-05-04 DIAGNOSIS — J449 Chronic obstructive pulmonary disease, unspecified: Secondary | ICD-10-CM

## 2022-05-07 ENCOUNTER — Other Ambulatory Visit: Payer: Self-pay | Admitting: Internal Medicine

## 2022-05-17 ENCOUNTER — Other Ambulatory Visit: Payer: Self-pay | Admitting: Nurse Practitioner

## 2022-05-17 DIAGNOSIS — E782 Mixed hyperlipidemia: Secondary | ICD-10-CM

## 2022-05-20 NOTE — Progress Notes (Signed)
Remote pacemaker transmission.   

## 2022-05-25 ENCOUNTER — Ambulatory Visit (INDEPENDENT_AMBULATORY_CARE_PROVIDER_SITE_OTHER): Payer: Medicare HMO

## 2022-05-25 ENCOUNTER — Ambulatory Visit
Admission: EM | Admit: 2022-05-25 | Discharge: 2022-05-25 | Disposition: A | Discharge status: Home / Self Care | Payer: Medicare HMO | Attending: Internal Medicine | Admitting: Internal Medicine

## 2022-05-25 ENCOUNTER — Encounter: Payer: Self-pay | Admitting: Emergency Medicine

## 2022-05-25 DIAGNOSIS — M25552 Pain in left hip: Secondary | ICD-10-CM | POA: Diagnosis not present

## 2022-05-25 MED ORDER — LIDOCAINE 5 % EX PTCH: 1.0000 | MEDICATED_PATCH | CUTANEOUS | 0 refills | Status: DC

## 2022-05-25 NOTE — ED Provider Notes (Signed)
[name]    CSN: [id number] Arrival date & time: 05/25/22  1120      History   Chief Complaint Chief Complaint  Patient presents with   Hip Pain    HPI [name] is a 87 y.o. female.   Patient presents with left hip pain that started about 2 days ago.  Patient denies any injury to the area.  Denies numbness or tingling.  Reports pain mainly occurs with bearing weight.  Has taken Tylenol for pain with some improvement.  Denies history of chronic hip pain.  Patient's oxygen was 91% during initial triage.  Patient reports that she has COPD and has a chronic cough.  Denies chest pain or shortness of breath or any associated fever.  States her normal oxygen is typically 94 to 95%.   Hip Pain    Past Medical History:  Diagnosis Date   Anxiety    Stable   ASCUS on Pap smear 11/15/2009   Carotid artery disease (HCC)    1-39% RICA stenosis and [id number] LICA stenosis on pre TAVR dopplers    Cataract    COPD (chronic obstructive pulmonary disease) (HCC)    Depression    Duodenal ulcer due to Helicobacter pylori Q000111Q   Endometrial polyp 11/15/2009   Endometrioid adenocarcinoma    H/O varicella    Heart murmur    Hemorrhoids    History of measles, mumps, or rubella    Hypercholesterolemia    Hypertension    Menopausal symptoms 06/13/2010   Ovarian cyst 11/15/2009   PMB (postmenopausal bleeding) 11/15/2009   Presence of permanent cardiac pacemaker 10/20/2018   Vaginal bleeding     Patient Active Problem List   Diagnosis Date Noted   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   NSVT (nonsustained ventricular tachycardia) ([place]) 01/06/2022   Conductive hearing loss, bilateral 09/12/2021   Generalized anxiety disorder 08/11/2021   Tobacco use 10/18/2020   COPD (chronic obstructive pulmonary disease) ([place]) 10/18/2020   Pacemaker-MDT 09/13/2020   Generalized weakness 07/04/2020   Dark stools 07/04/2020   DOE (dyspnea on exertion) 07/04/2020   Duodenal ulcer due  to Helicobacter pylori [id number]   Acute blood loss anemia 06/18/2020   HTN (hypertension) 06/18/2020   HLD (hyperlipidemia) 06/18/2020   Carotid arterial disease ([place]) 06/18/2020   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   Severe aortic valve stenosis    Complete heart block ([place]) 10/20/2018   Endometrial cancer ([place]) 06/12/2011    Past Surgical History:  Procedure Laterality Date   BIOPSY  06/19/2020   Procedure: BIOPSY;  Surgeon: Irving Copas., MD;  Location: [facility];  Service: Gastroenterology;;   BREAST SURGERY     Over twenty - five  years ago   CATARACT EXTRACTION W/ INTRAOCULAR LENS IMPLANT Bilateral    CERVICAL BIOPSY     ESOPHAGOGASTRODUODENOSCOPY (EGD) WITH PROPOFOL N/A 06/19/2020   Procedure: ESOPHAGOGASTRODUODENOSCOPY (EGD) WITH PROPOFOL;  Surgeon: Irving Copas., MD;  [facility] END[facility]COPY;  Service: Gastroenterology;  Laterality: N/A;   EYE SURGERY     INSERT / REPLACE / REMOVE PACEMAKER  10/20/2018   LAPAROSCOPIC ASSISTED VAGINAL HYSTERECTOMY  12/2009   BSO   MULTIPLE EXTRACTIONS WITH ALVEOLOPLASTY N/A 12/09/2018   Procedure: EXTRACTION OF TOOTH #'S 6,21-29 AND 32 WITH ALVEOLOPLASTY AND BILATERAL MANDIBULAR TORI REDUCTIONS;  Surgeon: Lenn Cal, DDS;  Location: [place];  Service: Oral Surgery;  Laterality: N/A;   PACEMAKER IMPLANT N/A 10/20/2018   Procedure: PACEMAKER IMPLANT;  Surgeon: Deboraha Sprang, MD;  Location: [place] [facility];  Service: Cardiovascular;  Laterality: N/A;   RIGHT/LEFT HEART CATH AND CORONARY ANGIOGRAPHY N/A 11/24/2018   Procedure: RIGHT/LEFT HEART CATH AND CORONARY ANGIOGRAPHY;  Surgeon: Burnell Blanks, MD;  Location: [place] [facility];  Service: Cardiovascular;  Laterality: N/A;   TEE WITHOUT CARDIOVERSION N/A 12/21/2018   Procedure: TRANSESOPHAGEAL ECHOCARDIOGRAM (TEE);  Surgeon: Burnell Blanks, MD;  Location: [place];  Service: Open Heart Surgery;  Laterality: N/A;    OB  History   No obstetric history on file.      Home Medications    Prior to Admission medications   Medication Sig Start Date End Date Taking? Authorizing Provider  acetaminophen (TYLENOL) 325 MG tablet Take 2 tablets (650 mg total) by mouth every 6 (six) hours as needed for moderate pain. 07/26/21  Yes Jaynee Eagles, PA-C  albuterol (VENTOLIN HFA) 108 (90 Base) MCG/ACT inhaler INHALE 2 PUFFS BY MOUTH EVERY 4 HOURS AS NEEDED FOR WHEEZING FOR SHORTNESS OF BREATH 05/05/22  Yes Boscia, Heather E, NP  amLODipine (NORVASC) 10 MG tablet Take 1 tablet (10 mg total) by mouth daily. Please schedule appointment for future refills. Thank you 10/31/21  Yes Eileen Stanford, PA-C  aspirin 81 MG chewable tablet Chew 1 tablet (81 mg total) by mouth daily. 12/23/18  Yes Barrett, Erin R, PA-C  carboxymethylcellulose (REFRESH PLUS) 0.5 % SOLN Place 1 drop into both eyes daily as needed (dry eyes).   Yes [provider]  Cholecalciferol (VITAMIN D-3) 125 MCG (5000 UT) TABS Take 5,000 Units by mouth daily.   Yes [provider]  citalopram (CELEXA) 40 MG tablet Take 0.5 tablets (20 mg total) by mouth daily. 01/16/22  Yes Boscia, Greer Ee, NP  clobetasol cream (TEMOVATE) [id number] % Apply 1 Application topically 2 (two) times daily. 01/16/22  Yes Ronnell Freshwater, NP  furosemide (LASIX) 20 MG tablet Take 1 tablet by mouth once daily 02/10/22  Yes McAlhany, Annita Brod, MD  hydrOXYzine (VISTARIL) 50 MG capsule Take 1 capsule (50 mg total) by mouth 3 (three) times daily as needed for anxiety. 01/16/22  Yes Boscia, Heather E, NP  lidocaine (LIDODERM) 5 % Place 1 patch onto the skin daily. Remove & Discard patch within 12 hours or as directed by MD 05/25/22  Yes Teodora Medici, FNP  Omega-3 Fatty Acids (FISH OIL) 1000 MG CAPS Take 1,000 mg by mouth daily.   Yes [provider]  pantoprazole (PROTONIX) 20 MG tablet Take 1 tablet by mouth once daily 05/07/22  Yes Gatha Mayer, MD  simvastatin (ZOCOR)  20 MG tablet Take 1 tablet by mouth once daily 05/19/22  Yes Boscia, Greer Ee, NP  umeclidinium-vilanterol (ANORO ELLIPTA) 62.5-25 MCG/ACT AEPB Inhale 1 puff by mouth once daily 03/27/22  Yes Byrum, Rose Fillers, MD  vitamin B-12 (CYANOCOBALAMIN) 500 MCG tablet Take 500 mcg by mouth daily.   Yes [provider]  vitamin E 180 MG (400 UNITS) capsule Take 400 Units by mouth daily.   Yes [provider]    Family History Family History  Problem Relation Age of Onset   Heart failure Mother    Parkinson's disease Father    Lung cancer Brother    Heart disease Brother    Colon cancer Neg Hx    Colon polyps Neg Hx    Esophageal cancer Neg Hx     Social History Social History   Tobacco Use   Smoking status: Every Day    Packs/day: 0.25  Years: 55.00    Total pack years: 13.75    Types: Cigarettes   Smokeless tobacco: Never   Tobacco comments:    5-6 cigarettes smoke daily HEI 03/27/2022  Vaping Use   Vaping Use: Never used  Substance Use Topics   Alcohol use: No   Drug use: Not Currently     Allergies   Patient has no known allergies.   Review of Systems Review of Systems Per HPI  Physical Exam Triage Vital Signs ED Triage Vitals  Enc Vitals Group     BP 05/25/22 1216 (!) 152/65     Pulse Rate 05/25/22 1216 73     Resp 05/25/22 1216 18     Temp 05/25/22 1216 98.4 F (36.9 C)     Temp Source 05/25/22 1216 Oral     SpO2 05/25/22 1216 91 %     Weight 05/25/22 1218 112 lb (50.8 kg)     Height 05/25/22 1218 '5\' 5"'$  (1.651 m)     Head Circumference --      Peak Flow --      Pain Score 05/25/22 1217 4     Pain Loc --      Pain Edu? --      Excl. in [place]? --    No data found.  Updated Vital Signs BP (!) 152/65 (BP Location: Left Arm)   Pulse 73   Temp 98.4 F (36.9 C) (Oral)   Resp 18   Ht '5\' 5"'$  (1.651 m)   Wt 112 lb (50.8 kg)   SpO2 97%   BMI 18.64 kg/m   Visual Acuity Right Eye Distance:   Left Eye Distance:   Bilateral Distance:     Right Eye Near:   Left Eye Near:    Bilateral Near:     Physical Exam Constitutional:      General: She is not in acute distress.    Appearance: Normal appearance. She is not toxic-appearing or diaphoretic.  HENT:     Head: Normocephalic and atraumatic.  Eyes:     Extraocular Movements: Extraocular movements intact.     Conjunctiva/sclera: Conjunctivae normal.  Cardiovascular:     Rate and Rhythm: Normal rate and regular rhythm.     Pulses: Normal pulses.     Heart sounds: Normal heart sounds.  Pulmonary:     Effort: Pulmonary effort is normal. No respiratory distress.     Breath sounds: Normal breath sounds. No stridor. No wheezing, rhonchi or rales.  Musculoskeletal:     Comments: Tenderness to palpation to lateral left hip.  No obvious swelling, discoloration, lacerations, abrasions noted.  Full range of motion of hip present with no crepitus noted.  No movement of hip.  Patient is neurovascularly intact.  Neurological:     General: No focal deficit present.     Mental Status: She is alert and oriented to person, place, and time. Mental status is at baseline.  Psychiatric:        Mood and Affect: Mood normal.        Behavior: Behavior normal.        Thought Content: Thought content normal.        Judgment: Judgment normal.      UC Treatments / Results  Labs (all labs ordered are listed, but only abnormal results are displayed) Labs Reviewed - No data to display  EKG   Radiology DG Hip Unilat With Pelvis 2-3 Views Left  Result Date: 05/25/2022 CLINICAL DATA:  LEFT hip pain and  LEFT leg pain for 2 days. No reported history of injury. EXAM: DG HIP (WITH OR WITHOUT PELVIS) 2-3V LEFT COMPARISON:  CT imaging from 2020. FINDINGS: Signs of osteopenia. Stool and gas overlie the iliac crest bilaterally. No signs of acute bone finding or destructive bone process about the bony pelvis. Degenerative changes of the bilateral hips. No sign of LEFT hip fracture. Soft tissues are  grossly unremarkable. IMPRESSION: 1. Degenerative changes of the bilateral hips. No acute findings. 2. Signs of osteopenia. Electronically Signed   By: Zetta Bills M.D.   On: 05/25/2022 12:57    Procedures Procedures (including critical care time)  Medications Ordered in UC Medications - No data to display  Initial Impression / Assessment and Plan / UC Course  I have reviewed the triage vital signs and the nursing notes.  Pertinent labs & imaging results that were available during my care of the patient were reviewed by me and considered in my medical decision making (see chart for details).     Left hip x-ray showing degenerative changes.  Most likely cause of patient's pain.  No signs of infection on exam or x-ray.  Limited options on pain management given patient's chronic health conditions and daily medications.  Continue Tylenol.  Will prescribe lidocaine patch topically.  Advised follow-up with orthopedist at provided contact information for further evaluation and management.   Patient's oxygen saturation was 91% on initial triage.  Recheck was 97%.  Patient is not any acute distress and has no associated respiratory symptoms.  Patient has COPD so suspect this is typical with walking and exertion.  Do not think any further workup is necessary for this.  Advised patient to monitor at home with home pulse oximeter and to follow-up if it is low.  Advised strict ER precautions.  Patient and family member verbalized understanding and were agreeable with plan.  Final Clinical Impressions(s) / UC Diagnoses   Final diagnoses:  Left hip pain     Discharge Instructions      Your x-ray is showing chronic changes of your hip which is most likely attributing to your pain.  You may continue Tylenol.  I have prescribed a lidocaine patch to help alleviate discomfort.  Follow-up with orthopedist at provided contact information tomorrow to schedule an appointment for further evaluation.    ED  Prescriptions     Medication Sig Dispense Auth. Provider   lidocaine (LIDODERM) 5 % Place 1 patch onto the skin daily. Remove & Discard patch within 12 hours or as directed by MD 30 patch [place], Michele Rockers, FNP      PDMP not reviewed this encounter.   Teodora Medici, [place] 05/25/22 [phone]

## 2022-05-25 NOTE — Discharge Instructions (Signed)
Your x-ray is showing chronic changes of your hip which is most likely attributing to your pain.  You may continue Tylenol.  I have prescribed a lidocaine patch to help alleviate discomfort.  Follow-up with orthopedist at provided contact information tomorrow to schedule an appointment for further evaluation.

## 2022-05-25 NOTE — ED Triage Notes (Signed)
Patient c/o left hip and leg pain x 2 days.  No injury.  Patient has taken Tylenol for pain.

## 2022-06-27 ENCOUNTER — Other Ambulatory Visit: Payer: Self-pay | Admitting: Nurse Practitioner

## 2022-06-27 DIAGNOSIS — J449 Chronic obstructive pulmonary disease, unspecified: Secondary | ICD-10-CM

## 2022-07-03 ENCOUNTER — Other Ambulatory Visit: Payer: Self-pay | Admitting: Nurse Practitioner

## 2022-07-03 DIAGNOSIS — J449 Chronic obstructive pulmonary disease, unspecified: Secondary | ICD-10-CM

## 2022-07-21 ENCOUNTER — Encounter: Payer: Self-pay | Admitting: Nurse Practitioner

## 2022-07-21 ENCOUNTER — Ambulatory Visit (INDEPENDENT_AMBULATORY_CARE_PROVIDER_SITE_OTHER): Payer: Medicare HMO | Admitting: Nurse Practitioner

## 2022-07-21 VITALS — BP 128/63 | HR 73 | Ht 65.0 in | Wt 106.1 lb

## 2022-07-21 DIAGNOSIS — N390 Urinary tract infection, site not specified: Secondary | ICD-10-CM | POA: Diagnosis not present

## 2022-07-21 DIAGNOSIS — J449 Chronic obstructive pulmonary disease, unspecified: Secondary | ICD-10-CM

## 2022-07-21 DIAGNOSIS — I10 Essential (primary) hypertension: Secondary | ICD-10-CM | POA: Diagnosis not present

## 2022-07-21 DIAGNOSIS — R350 Frequency of micturition: Secondary | ICD-10-CM | POA: Diagnosis not present

## 2022-07-21 LAB — POCT URINALYSIS DIP (CLINITEK)
Bilirubin, UA: NEGATIVE
Blood, UA: NEGATIVE
Glucose, UA: NEGATIVE mg/dL
Ketones, POC UA: NEGATIVE mg/dL
Leukocytes, UA: NEGATIVE
Nitrite, UA: POSITIVE — AB
POC PROTEIN,UA: NEGATIVE
Spec Grav, UA: 1.02 (ref 1.010–1.025)
Urobilinogen, UA: 0.2 E.U./dL
pH, UA: 5.5 (ref 5.0–8.0)

## 2022-07-21 MED ORDER — NITROFURANTOIN MONOHYD MACRO 100 MG PO CAPS
100.0000 mg | ORAL_CAPSULE | Freq: Two times a day (BID) | ORAL | 0 refills | Status: DC
Start: 2022-07-21 — End: 2022-09-22

## 2022-07-21 NOTE — Progress Notes (Signed)
Established patient visit   Patient: [name]   DOB: [birth year]   87 y.o. Female  MRN: [id number] Visit Date: 07/21/2022   Chief Complaint  Patient presents with   Medical Management of Chronic Issues   Subjective    Urinary Frequency  This is a chronic problem. The current episode started more than 1 month ago. The problem occurs every urination. The problem has been unchanged. There has been no fever. Associated symptoms include chills, frequency and urgency. She has tried acetaminophen for the symptoms. The treatment provided mild relief.    Follow up  -hth -generally well controlled  Left hip pain  -did go to urgent care in 05/2022 due to severity of pain.  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  --taking tylenol for this generally improves the pain.  Hx COPD -no unusual shortness of breath  -she does see pulmonology.     Medications: Outpatient Medications Prior to Visit  Medication Sig   acetaminophen (TYLENOL) 325 MG tablet Take 2 tablets (650 mg total) by mouth every 6 (six) hours as needed for moderate pain.   albuterol (VENTOLIN HFA) 108 (90 Base) MCG/ACT inhaler INHALE 2 PUFFS BY MOUTH EVERY 4 HOURS AS NEEDED FOR WHEEZING FOR SHORTNESS OF BREATH   amLODipine (NORVASC) 10 MG tablet Take 1 tablet (10 mg total) by mouth daily. Please schedule appointment for future refills. Thank you   aspirin 81 MG chewable tablet Chew 1 tablet (81 mg total) by mouth daily.   carboxymethylcellulose (REFRESH PLUS) 0.5 % SOLN Place 1 drop into both eyes daily as needed (dry eyes).   Cholecalciferol (VITAMIN D-3) 125 MCG (5000 UT) TABS Take 5,000 Units by mouth daily.   citalopram (CELEXA) 40 MG tablet Take 0.5 tablets (20 mg total) by mouth daily.   clobetasol cream (TEMOVATE) 0.05 % Apply 1 Application topically 2 (two) times daily.   furosemide (LASIX) 20 MG tablet Take 1 tablet by mouth once daily   hydrOXYzine (VISTARIL) 50 MG capsule Take 1 capsule (50 mg total) by  mouth 3 (three) times daily as needed for anxiety.   lidocaine (LIDODERM) 5 % Place 1 patch onto the skin daily. Remove & Discard patch within 12 hours or as directed by MD   Omega-3 Fatty Acids (FISH OIL) 1000 MG CAPS Take 1,000 mg by mouth daily.   umeclidinium-vilanterol (ANORO ELLIPTA) 62.5-25 MCG/ACT AEPB Inhale 1 puff by mouth once daily   vitamin B-12 (CYANOCOBALAMIN) 500 MCG tablet Take 500 mcg by mouth daily.   vitamin E 180 MG (400 UNITS) capsule Take 400 Units by mouth daily.   [DISCONTINUED] pantoprazole (PROTONIX) 20 MG tablet Take 1 tablet by mouth once daily   [DISCONTINUED] simvastatin (ZOCOR) 20 MG tablet Take 1 tablet by mouth once daily   No facility-administered medications prior to visit.    Review of Systems  Constitutional:  Positive for chills.  Genitourinary:  Positive for frequency and urgency.    Last CBC Lab Results  Component Value Date   WBC 5.1 09/02/2021   HGB 15.4 09/02/2021   HCT 45.5 09/02/2021   MCV 89 09/02/2021   MCH 30.3 09/02/2021   RDW 13.5 09/02/2021   PLT 145 (L) 09/02/2021   Last metabolic panel Lab Results  Component Value Date   GLUCOSE 86 09/02/2021   NA 139 09/02/2021   K 4.1 09/02/2021   CL 101 09/02/2021   CO2 22 09/02/2021   BUN 18 09/02/2021   CREATININE 0.83 09/02/2021   EGFR 70  09/02/2021   CALCIUM 9.5 09/02/2021   PROT 6.6 09/02/2021   ALBUMIN 4.5 09/02/2021   LABGLOB 2.1 09/02/2021   AGRATIO 2.1 09/02/2021   BILITOT 0.5 09/02/2021   ALKPHOS 88 09/02/2021   AST 25 09/02/2021   ALT 8 09/02/2021   ANIONGAP 5 07/04/2020   Last lipids Lab Results  Component Value Date   CHOL 143 09/02/2021   HDL 48 09/02/2021   LDLCALC 78 09/02/2021   TRIG 88 09/02/2021   CHOLHDL 3.0 09/02/2021   Last hemoglobin A1c Lab Results  Component Value Date   HGBA1C 5.3 09/02/2021   Last thyroid functions Lab Results  Component Value Date   TSH 3.240 09/02/2021        Objective     Today's Vitals   07/21/22 1537  07/21/22 1546  BP: (Abnormal) 145/55 128/63  Pulse: 73   SpO2: 96%   Weight: 106 lb 1.9 oz (48.1 kg)   Height: 5\' 5"  (1.651 m)    Body mass index is 17.66 kg/m.  BP Readings from Last 3 Encounters:  07/21/22 128/63  05/25/22 (Abnormal) 152/65  03/27/22 102/68    Wt Readings from Last 3 Encounters:  08/05/22 106 lb (48.1 kg)  07/21/22 106 lb 1.9 oz (48.1 kg)  05/25/22 112 lb (50.8 kg)    Physical Exam Vitals and nursing note reviewed.  Constitutional:      Appearance: Normal appearance. She is well-developed.  HENT:     Head: Normocephalic and atraumatic.     Nose: Nose normal.     Mouth/Throat:     Mouth: Mucous membranes are moist.     Pharynx: Oropharynx is clear.  Eyes:     Extraocular Movements: Extraocular movements intact.     Conjunctiva/sclera: Conjunctivae normal.     Pupils: Pupils are equal, round, and reactive to light.  Neck:     Vascular: No carotid bruit.  Cardiovascular:     Rate and Rhythm: Normal rate and regular rhythm.     Pulses: Normal pulses.     Heart sounds: Normal heart sounds.  Pulmonary:     Effort: Pulmonary effort is normal.     Breath sounds: Normal breath sounds.  Abdominal:     Palpations: Abdomen is soft.  Genitourinary:    Comments: Urine sample is positive for nitrites  Musculoskeletal:        General: Normal range of motion.     Cervical back: Normal range of motion and neck supple.  Lymphadenopathy:     Cervical: No cervical adenopathy.  Skin:    General: Skin is warm and dry.     Capillary Refill: Capillary refill takes less than 2 seconds.  Neurological:     General: No focal deficit present.     Mental Status: She is alert and oriented to person, place, and time.  Psychiatric:        Mood and Affect: Mood normal.        Behavior: Behavior normal.        Thought Content: Thought content normal.        Judgment: Judgment normal.     Results for orders placed or performed in visit on 07/21/22  Urine Culture    Specimen: Urine   Urine  Result Value Ref Range   Urine Culture, Routine Final report (A)    Organism ID, Bacteria Escherichia coli (A)    Antimicrobial Susceptibility Comment   POCT URINALYSIS DIP (CLINITEK)  Result Value Ref Range   Color, UA yellow  yellow   Clarity, UA clear clear   Glucose, UA negative negative mg/dL   Bilirubin, UA negative negative   Ketones, POC UA negative negative mg/dL   Spec Grav, UA 8.469 6.295 - 1.025   Blood, UA negative negative   pH, UA 5.5 5.0 - 8.0   POC PROTEIN,UA negative negative, trace   Urobilinogen, UA 0.2 0.2 or 1.0 E.U./dL   Nitrite, UA Positive (A) Negative   Leukocytes, UA Negative Negative    Assessment & Plan    Urinary tract infection without hematuria, site unspecified Assessment & Plan: Start macrobid 100 mg twice daily for 5 days  -send urine sample for culture and sensitivity and adjust treatment as indicated.   Orders: -     Nitrofurantoin Monohyd Macro; Take 1 capsule (100 mg total) by mouth 2 (two) times daily.  Dispense: 10 capsule; Refill: 0  Frequency of urination Assessment & Plan: Treat for infection   Orders: -     POCT URINALYSIS DIP (CLINITEK) -     Urine Culture; Future  Primary hypertension Assessment & Plan: BP stable.  Continue current medication.    Chronic obstructive pulmonary disease, unspecified COPD type (HCC) Assessment & Plan: Stable.  -continue inhalers and respiratory medications as prescribed.       Return in about 2 months (around 09/20/2022) for blood pressure, FBW a week prior to visit, MWV after 08/2022.         Carlean Jews, NP  [facility] at [facility] [phone] (phone) [phone] (fax)  [facility]

## 2022-07-21 NOTE — Progress Notes (Signed)
Started macrobid 100 mg bid for 5 days at time of visit.

## 2022-07-22 ENCOUNTER — Ambulatory Visit (INDEPENDENT_AMBULATORY_CARE_PROVIDER_SITE_OTHER): Payer: Medicare HMO

## 2022-07-22 DIAGNOSIS — I442 Atrioventricular block, complete: Secondary | ICD-10-CM

## 2022-07-23 LAB — CUP PACEART REMOTE DEVICE CHECK
Battery Remaining Longevity: 111 mo
Battery Voltage: 3 V
Brady Statistic AP VP Percent: 0.43 %
Brady Statistic AP VS Percent: 0 %
Brady Statistic AS VP Percent: 99.49 %
Brady Statistic AS VS Percent: 0.08 %
Brady Statistic RA Percent Paced: 0.46 %
Brady Statistic RV Percent Paced: 99.92 %
Date Time Interrogation Session: 20240423163322
Implantable Lead Connection Status: 753985
Implantable Lead Connection Status: 753985
Implantable Lead Implant Date: 20200722
Implantable Lead Implant Date: 20200722
Implantable Lead Location: 753859
Implantable Lead Location: 753860
Implantable Lead Model: 1944
Implantable Lead Model: 1948
Implantable Pulse Generator Implant Date: 20200722
Lead Channel Impedance Value: 380 Ohm
Lead Channel Impedance Value: 418 Ohm
Lead Channel Impedance Value: 513 Ohm
Lead Channel Impedance Value: 608 Ohm
Lead Channel Pacing Threshold Amplitude: 0.5 V
Lead Channel Pacing Threshold Amplitude: 0.5 V
Lead Channel Pacing Threshold Pulse Width: 0.4 ms
Lead Channel Pacing Threshold Pulse Width: 0.4 ms
Lead Channel Sensing Intrinsic Amplitude: 2.375 mV
Lead Channel Sensing Intrinsic Amplitude: 2.375 mV
Lead Channel Sensing Intrinsic Amplitude: 5 mV
Lead Channel Sensing Intrinsic Amplitude: 9.75 mV
Lead Channel Setting Pacing Amplitude: 1.5 V
Lead Channel Setting Pacing Amplitude: 2 V
Lead Channel Setting Pacing Pulse Width: 0.4 ms
Lead Channel Setting Sensing Sensitivity: 2 mV
Zone Setting Status: 755011
Zone Setting Status: 755011

## 2022-07-24 LAB — URINE CULTURE

## 2022-07-24 NOTE — Progress Notes (Signed)
Macrobid appropriate antibiotic

## 2022-08-05 ENCOUNTER — Ambulatory Visit (INDEPENDENT_AMBULATORY_CARE_PROVIDER_SITE_OTHER): Payer: Medicare HMO

## 2022-08-05 VITALS — Ht 65.0 in | Wt 106.0 lb

## 2022-08-05 DIAGNOSIS — Z0000 Encounter for general adult medical examination without abnormal findings: Secondary | ICD-10-CM

## 2022-08-05 NOTE — Patient Instructions (Addendum)
[name] , Thank you for taking time to come for your Medicare Wellness Visit. I appreciate your ongoing commitment to your health goals. Please review the following plan we discussed and let me know if I can assist you in the future.   These are the goals we discussed:  Goals       No current goals (pt-stated)        This is a list of the screening recommended for you and due dates:  Health Maintenance  Topic Date Due   COVID-19 Vaccine (6 - 2023-24 season) 08/21/2022*   Flu Shot  10/30/2022   Medicare Annual Wellness Visit  08/05/2023   DTaP/Tdap/Td vaccine (2 - Td or Tdap) 01/24/2032   Pneumonia Vaccine  Completed   DEXA scan (bone density measurement)  Completed   Zoster (Shingles) Vaccine  Completed   HPV Vaccine  Aged Out  *Topic was postponed. The date shown is not the original due date.    Advanced directives: Please bring a copy of your health care power of attorney and living will to the office to be added to your chart at your convenience.   Conditions/risks identified: None  Next appointment: Follow up in one year for your annual wellness visit    Preventive Care 65 Years and Older, Female Preventive care refers to lifestyle choices and visits with your health care provider that can promote health and wellness. What does preventive care include? A yearly physical exam. This is also called an annual well check. Dental exams once or twice a year. Routine eye exams. Ask your health care provider how often you should have your eyes checked. Personal lifestyle choices, including: Daily care of your teeth and gums. Regular physical activity. Eating a healthy diet. Avoiding tobacco and drug use. Limiting alcohol use. Practicing safe sex. Taking low-dose aspirin every day. Taking vitamin and mineral supplements as recommended by your health care provider. What happens during an annual well check? The services and screenings done by your health care provider during  your annual well check will depend on your age, overall health, lifestyle risk factors, and family history of disease. Counseling  Your health care provider may ask you questions about your: Alcohol use. Tobacco use. Drug use. Emotional well-being. Home and relationship well-being. Sexual activity. Eating habits. History of falls. Memory and ability to understand (cognition). Work and work Astronomer. Reproductive health. Screening  You may have the following tests or measurements: Height, weight, and BMI. Blood pressure. Lipid and cholesterol levels. These may be checked every 5 years, or more frequently if you are over 30 years old. Skin check. Lung cancer screening. You may have this screening every year starting at age 45 if you have a 30-pack-year history of smoking and currently smoke or have quit within the past 15 years. Fecal occult blood test (FOBT) of the stool. You may have this test every year starting at age 72. Flexible sigmoidoscopy or colonoscopy. You may have a sigmoidoscopy every 5 years or a colonoscopy every 10 years starting at age 71. Hepatitis C blood test. Hepatitis B blood test. Sexually transmitted disease (STD) testing. Diabetes screening. This is done by checking your blood sugar (glucose) after you have not eaten for a while (fasting). You may have this done every 1-3 years. Bone density scan. This is done to screen for osteoporosis. You may have this done starting at age 14. Mammogram. This may be done every 1-2 years. Talk to your health care provider about how often  you should have regular mammograms. Talk with your health care provider about your test results, treatment options, and if necessary, the need for more tests. Vaccines  Your health care provider may recommend certain vaccines, such as: Influenza vaccine. This is recommended every year. Tetanus, diphtheria, and acellular pertussis (Tdap, Td) vaccine. You may need a Td booster every 10  years. Zoster vaccine. You may need this after age 71. Pneumococcal 13-valent conjugate (PCV13) vaccine. One dose is recommended after age 58. Pneumococcal polysaccharide (PPSV23) vaccine. One dose is recommended after age 15. Talk to your health care provider about which screenings and vaccines you need and how often you need them. This information is not intended to replace advice given to you by your health care provider. Make sure you discuss any questions you have with your health care provider. Document Released: 04/13/2015 Document Revised: 12/05/2015 Document Reviewed: 01/16/2015 Elsevier Interactive Patient Education  2017 [place] Prevention in the Home Falls can cause injuries. They can happen to people of all ages. There are many things you can do to make your home safe and to help prevent falls. What can I do on the outside of my home? Regularly fix the edges of walkways and driveways and fix any cracks. Remove anything that might make you trip as you walk through a door, such as a raised step or threshold. Trim any bushes or trees on the path to your home. Use bright outdoor lighting. Clear any walking paths of anything that might make someone trip, such as rocks or tools. Regularly check to see if handrails are loose or broken. Make sure that both sides of any steps have handrails. Any raised decks and porches should have guardrails on the edges. Have any leaves, snow, or ice cleared regularly. Use sand or salt on walking paths during winter. Clean up any spills in your garage right away. This includes oil or grease spills. What can I do in the bathroom? Use night lights. Install grab bars by the toilet and in the tub and shower. Do not use towel bars as grab bars. Use non-skid mats or decals in the tub or shower. If you need to sit down in the shower, use a plastic, non-slip stool. Keep the floor dry. Clean up any water that spills on the floor as soon as it  happens. Remove soap buildup in the tub or shower regularly. Attach bath mats securely with double-sided non-slip rug tape. Do not have throw rugs and other things on the floor that can make you trip. What can I do in the bedroom? Use night lights. Make sure that you have a light by your bed that is easy to reach. Do not use any sheets or blankets that are too big for your bed. They should not hang down onto the floor. Have a firm chair that has side arms. You can use this for support while you get dressed. Do not have throw rugs and other things on the floor that can make you trip. What can I do in the kitchen? Clean up any spills right away. Avoid walking on wet floors. Keep items that you use a lot in easy-to-reach places. If you need to reach something above you, use a strong step stool that has a grab bar. Keep electrical cords out of the way. Do not use floor polish or wax that makes floors slippery. If you must use wax, use non-skid floor wax. Do not have throw rugs and other things on  the floor that can make you trip. What can I do with my stairs? Do not leave any items on the stairs. Make sure that there are handrails on both sides of the stairs and use them. Fix handrails that are broken or loose. Make sure that handrails are as long as the stairways. Check any carpeting to make sure that it is firmly attached to the stairs. Fix any carpet that is loose or worn. Avoid having throw rugs at the top or bottom of the stairs. If you do have throw rugs, attach them to the floor with carpet tape. Make sure that you have a light switch at the top of the stairs and the bottom of the stairs. If you do not have them, ask someone to add them for you. What else can I do to help prevent falls? Wear shoes that: Do not have high heels. Have rubber bottoms. Are comfortable and fit you well. Are closed at the toe. Do not wear sandals. If you use a stepladder: Make sure that it is fully opened.  Do not climb a closed stepladder. Make sure that both sides of the stepladder are locked into place. Ask someone to hold it for you, if possible. Clearly mark and make sure that you can see: Any grab bars or handrails. First and last steps. Where the edge of each step is. Use tools that help you move around (mobility aids) if they are needed. These include: Canes. Walkers. Scooters. Crutches. Turn on the lights when you go into a dark area. Replace any light bulbs as soon as they burn out. Set up your furniture so you have a clear path. Avoid moving your furniture around. If any of your floors are uneven, fix them. If there are any pets around you, be aware of where they are. Review your medicines with your doctor. Some medicines can make you feel dizzy. This can increase your chance of falling. Ask your doctor what other things that you can do to help prevent falls. This information is not intended to replace advice given to you by your health care provider. Make sure you discuss any questions you have with your health care provider. Document Released: 01/11/2009 Document Revised: 08/23/2015 Document Reviewed: 04/21/2014 Elsevier Interactive Patient Education  2017 [employer].

## 2022-08-05 NOTE — Progress Notes (Signed)
Subjective:   [name] is a 87 y.o. female who presents for Medicare Annual (Subsequent) preventive examination.  Review of Systems    Virtual Visit via Telephone Note  I connected with  [name] on 08/05/22 at 12:30 PM EDT by telephone and verified that I am speaking with the correct person using two identifiers.  Location: Patient: Home Provider: Office Persons participating in the virtual visit: patient/Nurse Health Advisor   I discussed the limitations, risks, security and privacy concerns of performing an evaluation and management service by telephone and the availability of in person appointments. The patient expressed understanding and agreed to proceed.  Interactive audio and video telecommunications were attempted between this nurse and patient, however failed, due to patient having technical difficulties OR patient did not have access to video capability.  We continued and completed visit with audio only.  Some vital signs may be absent or patient reported.   Gina Rung, LPN  Cardiac Risk Factors include: advanced age (>20men, >84 women);hypertension     Objective:    Today's Vitals   08/05/22 1237  Weight: 106 lb (48.1 kg)  Height: 5\' 5"  (1.651 m)   Body mass index is 17.64 kg/m.     08/05/2022   12:46 PM 07/05/2020    1:20 AM 06/19/2020    3:05 PM 06/19/2020    2:00 PM 06/19/2020    8:58 AM 12/21/2018    6:00 PM 12/21/2018    6:23 AM  Advanced Directives  Does Patient Have a Medical Advance Directive? Yes Yes Yes Yes Yes Yes Yes  Type of Estate agent of [place];Living will Living will;Healthcare Power of [employer] Power of [employer] Power of [place];Living will Healthcare Power of [employer] Power of Attorney  Does patient want to make changes to medical advance directive?  No - Patient declined  No - Patient declined  No - Patient declined   Copy of Healthcare Power of Attorney in Chart? No -  copy requested     No - copy requested No - copy requested    Current Medications (verified) Outpatient Encounter Medications as of 08/05/2022  Medication Sig   acetaminophen (TYLENOL) 325 MG tablet Take 2 tablets (650 mg total) by mouth every 6 (six) hours as needed for moderate pain.   albuterol (VENTOLIN HFA) 108 (90 Base) MCG/ACT inhaler INHALE 2 PUFFS BY MOUTH EVERY 4 HOURS AS NEEDED FOR WHEEZING FOR SHORTNESS OF BREATH   amLODipine (NORVASC) 10 MG tablet Take 1 tablet (10 mg total) by mouth daily. Please schedule appointment for future refills. Thank you   aspirin 81 MG chewable tablet Chew 1 tablet (81 mg total) by mouth daily.   carboxymethylcellulose (REFRESH PLUS) 0.5 % SOLN Place 1 drop into both eyes daily as needed (dry eyes).   Cholecalciferol (VITAMIN D-3) 125 MCG (5000 UT) TABS Take 5,000 Units by mouth daily.   citalopram (CELEXA) 40 MG tablet Take 0.5 tablets (20 mg total) by mouth daily.   clobetasol cream (TEMOVATE) 0.05 % Apply 1 Application topically 2 (two) times daily.   furosemide (LASIX) 20 MG tablet Take 1 tablet by mouth once daily   hydrOXYzine (VISTARIL) 50 MG capsule Take 1 capsule (50 mg total) by mouth 3 (three) times daily as needed for anxiety.   lidocaine (LIDODERM) 5 % Place 1 patch onto the skin daily. Remove & Discard patch within 12 hours or as directed by MD   nitrofurantoin, macrocrystal-monohydrate, (MACROBID) 100 MG capsule  Take 1 capsule (100 mg total) by mouth 2 (two) times daily.   Omega-3 Fatty Acids (FISH OIL) 1000 MG CAPS Take 1,000 mg by mouth daily.   pantoprazole (PROTONIX) 20 MG tablet Take 1 tablet by mouth once daily   simvastatin (ZOCOR) 20 MG tablet Take 1 tablet by mouth once daily   umeclidinium-vilanterol (ANORO ELLIPTA) 62.5-25 MCG/ACT AEPB Inhale 1 puff by mouth once daily   vitamin B-12 (CYANOCOBALAMIN) 500 MCG tablet Take 500 mcg by mouth daily.   vitamin E 180 MG (400 UNITS) capsule Take 400 Units by mouth daily.   No  facility-administered encounter medications on file as of 08/05/2022.    Allergies (verified) Patient has no known allergies.   History: Past Medical History:  Diagnosis Date   Anxiety    Stable   ASCUS on Pap smear 11/15/2009   Carotid artery disease (HCC)    1-39% RICA stenosis and 60-79% LICA stenosis on pre TAVR dopplers    Cataract    COPD (chronic obstructive pulmonary disease) (HCC)    Depression    Duodenal ulcer due to Helicobacter pylori 06/28/2020   Endometrial polyp 11/15/2009   Endometrioid adenocarcinoma    H/O varicella    Heart murmur    Hemorrhoids    History of measles, mumps, or rubella    Hypercholesterolemia    Hypertension    Menopausal symptoms 06/13/2010   Ovarian cyst 11/15/2009   PMB (postmenopausal bleeding) 11/15/2009   Presence of permanent cardiac pacemaker 10/20/2018   Vaginal bleeding    Past Surgical History:  Procedure Laterality Date   BIOPSY  06/19/2020   Procedure: BIOPSY;  Surgeon: Lemar Lofty., MD;  Location: [facility] ENDOSCOPY;  Service: Gastroenterology;;   BREAST SURGERY     Over twenty - five  years ago   CATARACT EXTRACTION W/ INTRAOCULAR LENS IMPLANT Bilateral    CERVICAL BIOPSY     ESOPHAGOGASTRODUODENOSCOPY (EGD) WITH PROPOFOL N/A 06/19/2020   Procedure: ESOPHAGOGASTRODUODENOSCOPY (EGD) WITH PROPOFOL;  Surgeon: Lemar Lofty., MD;  Location[facility] ENDOSCOPY;  Service: Gastroenterology;  Laterality: N/A;   EYE SURGERY     INSERT / REPLACE / REMOVE PACEMAKER  10/20/2018   LAPAROSCOPIC ASSISTED VAGINAL HYSTERECTOMY  12/2009   BSO   MULTIPLE EXTRACTIONS WITH ALVEOLOPLASTY N/A 12/09/2018   Procedure: EXTRACTION OF TOOTH #'S 6,21-29 AND 32 WITH ALVEOLOPLASTY AND BILATERAL MANDIBULAR TORI REDUCTIONS;  Surgeon: Charlynne Pander, DDS;  Location: [facility] OR;  Service: Oral Surgery;  Laterality: N/A;   PACEMAKER IMPLANT N/A 10/20/2018   Procedure: PACEMAKER IMPLANT;  Surgeon: Duke Salvia, MD;  Location: [facility] INVASIVE CV LAB;   Service: Cardiovascular;  Laterality: N/A;   RIGHT/LEFT HEART CATH AND CORONARY ANGIOGRAPHY N/A 11/24/2018   Procedure: RIGHT/LEFT HEART CATH AND CORONARY ANGIOGRAPHY;  Surgeon: Kathleene Hazel, MD;  Location: [facility] INVASIVE CV LAB;  Service: Cardiovascular;  Laterality: N/A;   TEE WITHOUT CARDIOVERSION N/A 12/21/2018   Procedure: TRANSESOPHAGEAL ECHOCARDIOGRAM (TEE);  Surgeon: Kathleene Hazel, MD;  Location: [facility] OR;  Service: Open Heart Surgery;  Laterality: N/A;   Family History  Problem Relation Age of Onset   Heart failure Mother    Parkinson's disease Father    Lung cancer Brother    Heart disease Brother    Colon cancer Neg Hx    Colon polyps Neg Hx    Esophageal cancer Neg Hx    Social History   Socioeconomic History   Marital status: Widowed    Spouse name: Not on file  Number of children: Not on file   Years of education: Not on file   Highest education level: Not on file  Occupational History   Occupation: Retired-sales rep  Tobacco Use   Smoking status: Every Day    Packs/day: 0.25    Years: 55.00    Additional pack years: 0.00    Total pack years: 13.75    Types: Cigarettes   Smokeless tobacco: Never   Tobacco comments:    5-6 cigarettes smoke daily HEI 03/27/2022  Vaping Use   Vaping Use: Never used  Substance and Sexual Activity   Alcohol use: No   Drug use: Not Currently   Sexual activity: Not Currently  Other Topics Concern   Not on file  Social History Narrative   Not on file   Social Determinants of Health   Financial Resource Strain: Low Risk  (08/05/2022)   Overall Financial Resource Strain (CARDIA)    Difficulty of Paying Living Expenses: Not hard at all  Food Insecurity: No Food Insecurity (08/05/2022)   Hunger Vital Sign    Worried About Running Out of Food in the Last Year: Never true    Ran Out of Food in the Last Year: Never true  Transportation Needs: No Transportation Needs (08/05/2022)   PRAPARE - Scientist, research (physical sciences) (Medical): No    Lack of Transportation (Non-Medical): No  Physical Activity: Inactive (08/05/2022)   Exercise Vital Sign    Days of Exercise per Week: 0 days    Minutes of Exercise per Session: 0 min  Stress: No Stress Concern Present (08/05/2022)   [employer] of Occupational Health - Occupational Stress Questionnaire    Feeling of Stress : Not at all  Social Connections: Moderately Integrated (08/05/2022)   Social Connection and Isolation Panel [NHANES]    Frequency of Communication with Friends and Family: More than three times a week    Frequency of Social Gatherings with Friends and Family: More than three times a week    Attends Religious Services: More than 4 times per year    Active Member of [employer] or Organizations: Yes    Attends Banker Meetings: More than 4 times per year    Marital Status: Widowed    Tobacco Counseling Ready to quit: No Counseling given: Yes Tobacco comments: 5-6 cigarettes smoke daily HEI 03/27/2022   Clinical Intake:  Pre-visit preparation completed: No  Pain : No/denies pain     BMI - recorded: 17.64 Nutritional Status: BMI <19  Underweight Nutritional Risks: None Diabetes: No  How often do you need to have someone help you when you read instructions, pamphlets, or other written materials from your doctor or pharmacy?: 1 - Never  Diabetic?  No  Interpreter Needed?: No  Information entered by :: Theresa Mulligan PN   Activities of Daily Living    08/05/2022   12:43 PM 01/16/2022    4:05 PM  In your present state of health, do you have any difficulty performing the following activities:  Hearing? 1 1  Comment Wears hearing aids   Vision? 0 0  Difficulty concentrating or making decisions? 0 0  Walking or climbing stairs? 0 1  Dressing or bathing? 0 0  Doing errands, shopping? 0 1  Preparing Food and eating ? N   Using the Toilet? N   In the past six months, have you accidently leaked urine? Y    Comment Wears breifs. Followed by PCP   Do you have problems  with loss of bowel control? Y   Comment Wears breif. Followed by PCP   Managing your Medications? N   Managing your Finances? N   Housekeeping or managing your Housekeeping? N     Patient Care Team: Carlean Jews, NP as PCP - General (Family Medicine) Duke Salvia, MD as PCP - Electrophysiology (Cardiology) Kathleene Hazel, MD as PCP - Cardiology (Cardiology)  Indicate any recent Medical Services you may have received from other than [facility] providers in the past year (date may be approximate).     Assessment:   This is a routine wellness examination for [name].  Hearing/Vision screen Hearing Screening - Comments:: Wears hearing aids Vision Screening - Comments:: Wears rx glasses - up to date with routine eye exams with  Dr Dione Booze  Dietary issues and exercise activities discussed: Exercise limited by: None identified   Goals Addressed               This Visit's Progress     No current goals (pt-stated)         Depression Screen    08/05/2022   12:42 PM 07/21/2022    3:39 PM 01/16/2022    4:05 PM 09/12/2021    3:45 PM 08/07/2021   11:37 AM  PHQ 2/9 Scores  PHQ - 2 Score 0 0 2 2 0  PHQ- 9 Score 0 1 3 2 3     Fall Risk    08/05/2022   12:45 PM 09/12/2021    3:45 PM 08/07/2021   11:16 AM  Fall Risk   Falls in the past year? 0 1 1  Number falls in past yr: 0 1 0  Injury with Fall? 0 1 1  Risk for fall due to : No Fall Risks Impaired balance/gait;Impaired mobility;History of fall(s)   Follow up Falls prevention discussed Falls evaluation completed Falls evaluation completed    FALL RISK PREVENTION PERTAINING TO THE HOME:  Any stairs in or around the home? No  If so, are there any without handrails? No  Home free of loose throw rugs in walkways, pet beds, electrical cords, etc? Yes  Adequate lighting in your home to reduce risk of falls? Yes   ASSISTIVE DEVICES UTILIZED TO PREVENT  FALLS:  Life alert? No  Use of a cane, walker or w/c? Yes  Grab bars in the bathroom? No  Shower chair or bench in shower? Yes  Elevated toilet seat or a handicapped toilet? No   TIMED UP AND GO:  Was the test performed? No . Audio Visit   Cognitive Function:        09/12/2021    3:50 PM  6CIT Screen  What Year? 0 points  What month? 0 points  What time? 0 points  Count back from 20 0 points  Months in reverse 0 points  Repeat phrase 0 points  Total Score 0 points    Immunizations Immunization History  Administered Date(s) Administered   PFIZER(Purple Top)SARS-COV-2 Vaccination 05/03/2019, 05/24/2019, 01/27/2020   PNEUMOCOCCAL CONJUGATE-20 08/17/2020   Pfizer Covid-19 Vaccine Bivalent Booster 27yrs & up 12/25/2020, 01/22/2022   Tdap 01/23/2022   Zoster Recombinat (Shingrix) 01/04/2020, 03/05/2020    TDAP status: Up to date  Flu Vaccine status: Up to date  Pneumococcal vaccine status: Up to date  Covid-19 vaccine status: Completed vaccines  Qualifies for Shingles Vaccine? Yes   Zostavax completed Yes   Shingrix Completed?: Yes  Screening Tests Health Maintenance  Topic Date Due   COVID-19 Vaccine (6 -  2023-24 season) 08/21/2022 (Originally 03/19/2022)   INFLUENZA VACCINE  10/30/2022   Medicare Annual Wellness (AWV)  08/05/2023   DTaP/Tdap/Td (2 - Td or Tdap) 01/24/2032   Pneumonia Vaccine 79+ Years old  Completed   DEXA SCAN  Completed   Zoster Vaccines- Shingrix  Completed   HPV VACCINES  Aged Out    Health Maintenance  There are no preventive care reminders to display for this patient.   Colorectal cancer screening: No longer required.   Mammogram status: No longer required due to Age.  Bone Density status: Completed 12/05/10. Results reflect: Bone density results: OSTEOPOROSIS. Repeat every   years.  Lung Cancer Screening: (Low Dose CT Chest recommended if Age 69-80 years, 30 pack-year currently smoking OR have quit w/in 15years.) does  qualify.   Lung Cancer Screening Referral: Deferred  Additional Screening:  Hepatitis C Screening: does not qualify; Completed   Vision Screening: Recommended annual ophthalmology exams for early detection of glaucoma and other disorders of the eye. Is the patient up to date with their annual eye exam?  Yes  Who is the provider or what is the name of the office in which the patient attends annual eye exams? Dr Dione Booze If pt is not established with a provider, would they like to be referred to a provider to establish care? No .   Dental Screening: Recommended annual dental exams for proper oral hygiene  Community Resource Referral / Chronic Care Management:  CRR required this visit?  No   CCM required this visit?  No      Plan:     I have personally reviewed and noted the following in the patient's chart:   Medical and social history Use of alcohol, tobacco or illicit drugs  Current medications and supplements including opioid prescriptions. Patient is not currently taking opioid prescriptions. Functional ability and status Nutritional status Physical activity Advanced directives List of other physicians Hospitalizations, surgeries, and ER visits in previous 12 months Vitals Screenings to include cognitive, depression, and falls Referrals and appointments  In addition, I have reviewed and discussed with patient certain preventive protocols, quality metrics, and best practice recommendations. A written personalized care plan for preventive services as well as general preventive health recommendations were provided to patient.     Gina Rung, LPN   04/05/1094   Nurse Notes:   None

## 2022-08-10 ENCOUNTER — Other Ambulatory Visit: Payer: Self-pay | Admitting: Internal Medicine

## 2022-08-10 ENCOUNTER — Other Ambulatory Visit: Payer: Self-pay | Admitting: Nurse Practitioner

## 2022-08-10 DIAGNOSIS — E782 Mixed hyperlipidemia: Secondary | ICD-10-CM

## 2022-08-13 ENCOUNTER — Other Ambulatory Visit: Payer: Self-pay | Admitting: Nurse Practitioner

## 2022-08-13 DIAGNOSIS — E782 Mixed hyperlipidemia: Secondary | ICD-10-CM

## 2022-08-14 DIAGNOSIS — N390 Urinary tract infection, site not specified: Secondary | ICD-10-CM | POA: Insufficient documentation

## 2022-08-14 DIAGNOSIS — R350 Frequency of micturition: Secondary | ICD-10-CM | POA: Insufficient documentation

## 2022-08-14 NOTE — Assessment & Plan Note (Signed)
BP stable.  Continue current medication. 

## 2022-08-14 NOTE — Assessment & Plan Note (Signed)
Treat for infection

## 2022-08-14 NOTE — Assessment & Plan Note (Signed)
Start macrobid 100 mg twice daily for 5 days  -send urine sample for culture and sensitivity and adjust treatment as indicated.

## 2022-08-14 NOTE — Assessment & Plan Note (Signed)
Stable.  -continue inhalers and respiratory medications as prescribed.

## 2022-08-20 NOTE — Progress Notes (Signed)
Remote pacemaker transmission.   

## 2022-09-04 ENCOUNTER — Other Ambulatory Visit: Payer: Self-pay

## 2022-09-04 DIAGNOSIS — Z0000 Encounter for general adult medical examination without abnormal findings: Secondary | ICD-10-CM

## 2022-09-04 DIAGNOSIS — Z130 Encounter for screening for diseases of the blood and blood-forming organs and certain disorders involving the immune mechanism: Secondary | ICD-10-CM

## 2022-09-16 ENCOUNTER — Other Ambulatory Visit: Payer: Medicare HMO

## 2022-09-16 DIAGNOSIS — Z0000 Encounter for general adult medical examination without abnormal findings: Secondary | ICD-10-CM

## 2022-09-16 DIAGNOSIS — Z130 Encounter for screening for diseases of the blood and blood-forming organs and certain disorders involving the immune mechanism: Secondary | ICD-10-CM

## 2022-09-17 LAB — COMPREHENSIVE METABOLIC PANEL
ALT: 5 IU/L (ref 0–32)
AST: 20 IU/L (ref 0–40)
Albumin: 4.1 g/dL (ref 3.7–4.7)
Alkaline Phosphatase: 79 IU/L (ref 44–121)
BUN/Creatinine Ratio: 25 (ref 12–28)
BUN: 20 mg/dL (ref 8–27)
Bilirubin Total: 0.4 mg/dL (ref 0.0–1.2)
CO2: 23 mmol/L (ref 20–29)
Calcium: 9.3 mg/dL (ref 8.7–10.3)
Chloride: 103 mmol/L (ref 96–106)
Creatinine, Ser: 0.81 mg/dL (ref 0.57–1.00)
Globulin, Total: 2.1 g/dL (ref 1.5–4.5)
Glucose: 86 mg/dL (ref 70–99)
Potassium: 4 mmol/L (ref 3.5–5.2)
Sodium: 140 mmol/L (ref 134–144)
Total Protein: 6.2 g/dL (ref 6.0–8.5)
eGFR: 72 mL/min/{1.73_m2} (ref >59)

## 2022-09-17 LAB — CBC
Hematocrit: 42.4 % (ref 34.0–46.6)
Hemoglobin: 14 g/dL (ref 11.1–15.9)
MCH: 29.9 pg (ref 26.6–33.0)
MCHC: 33 g/dL (ref 31.5–35.7)
MCV: 91 fL (ref 79–97)
Platelets: 145 10*3/uL — ABNORMAL LOW (ref 150–450)
RBC: 4.68 x10E6/uL (ref 3.77–5.28)
RDW: 13.5 % (ref 11.7–15.4)
WBC: 4.2 10*3/uL (ref 3.4–10.8)

## 2022-09-17 LAB — TSH: TSH: 3.18 u[IU]/mL (ref 0.450–4.500)

## 2022-09-17 LAB — HEMOGLOBIN A1C
Est. average glucose Bld gHb Est-mCnc: 111 mg/dL
Hgb A1c MFr Bld: 5.5 % (ref 4.8–5.6)

## 2022-09-17 LAB — LIPID PANEL
Chol/HDL Ratio: 2.8 ratio (ref 0.0–4.4)
Cholesterol, Total: 137 mg/dL (ref 100–199)
HDL: 49 mg/dL (ref >39)
LDL Chol Calc (NIH): 70 mg/dL (ref 0–99)
Triglycerides: 98 mg/dL (ref 0–149)
VLDL Cholesterol Cal: 18 mg/dL (ref 5–40)

## 2022-09-19 ENCOUNTER — Telehealth: Payer: Self-pay | Admitting: Internal Medicine

## 2022-09-19 NOTE — Telephone Encounter (Signed)
Inbound call from stating she is having very dark stools and uncontrollable bowel movements. Advised of next available appointment.Requesting a call back to speak with a nurse to be advised. Please advise, thank you.

## 2022-09-19 NOTE — Telephone Encounter (Signed)
Patient called in with concerns of black, loose stools for 1 week with incontinence. Denies any pain, n/v. She is now complaining of weakness. Given her age, history & current symptoms, I advised patient to be seen in the ED for a sooner evaluation, and that we have providers on site at [facility]. Pt is very hesitant & prefers to be seen in office, however we do not have any appointments available until at least 09/29/21. Advised her that I will have Viviann Spare, RN contact her on Monday for symptom update and to discuss tentative appointment for 09/30/22 at 1:30 with an APP, however again advised strongly that she been seen in ED for sooner evaluation. Pt verbalized all understanding.

## 2022-09-22 ENCOUNTER — Ambulatory Visit (INDEPENDENT_AMBULATORY_CARE_PROVIDER_SITE_OTHER): Payer: Medicare HMO | Admitting: Nurse Practitioner

## 2022-09-22 ENCOUNTER — Encounter: Payer: Self-pay | Admitting: Nurse Practitioner

## 2022-09-22 VITALS — BP 158/64 | HR 75 | Ht 65.0 in | Wt 103.0 lb

## 2022-09-22 DIAGNOSIS — N390 Urinary tract infection, site not specified: Secondary | ICD-10-CM | POA: Diagnosis not present

## 2022-09-22 DIAGNOSIS — E782 Mixed hyperlipidemia: Secondary | ICD-10-CM

## 2022-09-22 DIAGNOSIS — J449 Chronic obstructive pulmonary disease, unspecified: Secondary | ICD-10-CM

## 2022-09-22 DIAGNOSIS — L409 Psoriasis, unspecified: Secondary | ICD-10-CM

## 2022-09-22 DIAGNOSIS — I10 Essential (primary) hypertension: Secondary | ICD-10-CM

## 2022-09-22 LAB — POCT URINALYSIS DIP (CLINITEK)
Bilirubin, UA: NEGATIVE
Blood, UA: NEGATIVE
Glucose, UA: NEGATIVE mg/dL
Ketones, POC UA: NEGATIVE mg/dL
Nitrite, UA: NEGATIVE
Spec Grav, UA: 1.015 (ref 1.010–1.025)
Urobilinogen, UA: 1 E.U./dL
pH, UA: 7 (ref 5.0–8.0)

## 2022-09-22 MED ORDER — SULFAMETHOXAZOLE-TRIMETHOPRIM 800-160 MG PO TABS
1.0000 | ORAL_TABLET | Freq: Two times a day (BID) | ORAL | 0 refills | Status: DC
Start: 2022-09-22 — End: 2022-09-30

## 2022-09-22 MED ORDER — HALOBETASOL PROPIONATE 0.05 % EX CREA
TOPICAL_CREAM | Freq: Two times a day (BID) | CUTANEOUS | 2 refills | Status: DC
Start: 2022-09-22 — End: 2023-04-24

## 2022-09-22 NOTE — Progress Notes (Signed)
Established patient visit   Patient: [name]   DOB: [birth year]   87 y.o. Female  MRN: [id number] Visit Date: 09/22/2022   Chief Complaint  Patient presents with   Medical Management of Chronic Issues   Subjective    HPI  -htn -generally well controlled  Hx COPD -no unusual shortness of breath  -she does see pulmonology.  Routine, fasting labs done prior to today.  -low platelet count, but stable.  -having low back pain and dysuria.  -Has been going on for the last few days.  Gradually getting worse.  -She denies chest pain, chest pressure, or shortness of breath. She denies headaches or visual disturbances. She denies abdominal pain, nausea, vomiting, or changes in bowel or bladder habits.     Medications: Outpatient Medications Prior to Visit  Medication Sig   acetaminophen (TYLENOL) 325 MG tablet Take 2 tablets (650 mg total) by mouth every 6 (six) hours as needed for moderate pain.   albuterol (VENTOLIN HFA) 108 (90 Base) MCG/ACT inhaler INHALE 2 PUFFS BY MOUTH EVERY 4 HOURS AS NEEDED FOR WHEEZING FOR SHORTNESS OF BREATH   amLODipine (NORVASC) 10 MG tablet Take 1 tablet (10 mg total) by mouth daily. Please schedule appointment for future refills. Thank you   aspirin 81 MG chewable tablet Chew 1 tablet (81 mg total) by mouth daily.   carboxymethylcellulose (REFRESH PLUS) 0.5 % SOLN Place 1 drop into both eyes daily as needed (dry eyes).   Cholecalciferol (VITAMIN D-3) 125 MCG (5000 UT) TABS Take 5,000 Units by mouth daily.   citalopram (CELEXA) 40 MG tablet Take 0.5 tablets (20 mg total) by mouth daily.   furosemide (LASIX) 20 MG tablet Take 1 tablet by mouth once daily   hydrOXYzine (VISTARIL) 50 MG capsule Take 1 capsule (50 mg total) by mouth 3 (three) times daily as needed for anxiety.   Omega-3 Fatty Acids (FISH OIL) 1000 MG CAPS Take 1,000 mg by mouth daily.   pantoprazole (PROTONIX) 20 MG tablet Take 1 tablet by mouth once daily   simvastatin (ZOCOR) 20 MG tablet  Take 1 tablet by mouth once daily   umeclidinium-vilanterol (ANORO ELLIPTA) 62.5-25 MCG/ACT AEPB Inhale 1 puff by mouth once daily   vitamin B-12 (CYANOCOBALAMIN) 500 MCG tablet Take 500 mcg by mouth daily.   vitamin E 180 MG (400 UNITS) capsule Take 400 Units by mouth daily.   [DISCONTINUED] nitrofurantoin, macrocrystal-monohydrate, (MACROBID) 100 MG capsule Take 1 capsule (100 mg total) by mouth 2 (two) times daily.   [DISCONTINUED] clobetasol cream (TEMOVATE) 0.05 % Apply 1 Application topically 2 (two) times daily.   [DISCONTINUED] lidocaine (LIDODERM) 5 % Place 1 patch onto the skin daily. Remove & Discard patch within 12 hours or as directed by MD   No facility-administered medications prior to visit.    Review of Systems See HPI    Last CBC Lab Results  Component Value Date   WBC 4.2 09/16/2022   HGB 14.0 09/16/2022   HCT 42.4 09/16/2022   MCV 91 09/16/2022   MCH 29.9 09/16/2022   RDW 13.5 09/16/2022   PLT 145 (L) 09/16/2022   Last metabolic panel Lab Results  Component Value Date   GLUCOSE 86 09/16/2022   NA 140 09/16/2022   K 4.0 09/16/2022   CL 103 09/16/2022   CO2 23 09/16/2022   BUN 20 09/16/2022   CREATININE 0.81 09/16/2022   EGFR 72 09/16/2022   CALCIUM 9.3 09/16/2022   PROT 6.2 09/16/2022   ALBUMIN 4.1  09/16/2022   LABGLOB 2.1 09/16/2022   AGRATIO 2.1 09/02/2021   BILITOT 0.4 09/16/2022   ALKPHOS 79 09/16/2022   AST 20 09/16/2022   ALT 5 09/16/2022   ANIONGAP 5 07/04/2020   Last lipids Lab Results  Component Value Date   CHOL 137 09/16/2022   HDL 49 09/16/2022   LDLCALC 70 09/16/2022   TRIG 98 09/16/2022   CHOLHDL 2.8 09/16/2022   Last hemoglobin A1c Lab Results  Component Value Date   HGBA1C 5.5 09/16/2022   Last thyroid functions Lab Results  Component Value Date   TSH 3.180 09/16/2022        Objective     Today's Vitals   09/22/22 1421  BP: (Abnormal) 158/64  Pulse: 75  SpO2: 95%  Weight: 103 lb (46.7 kg)  Height: 5\' 5"   (1.651 m)   Body mass index is 17.14 kg/m.  BP Readings from Last 3 Encounters:  09/22/22 (Abnormal) 158/64  07/21/22 128/63  05/25/22 (Abnormal) 152/65    Wt Readings from Last 3 Encounters:  09/22/22 103 lb (46.7 kg)  08/05/22 106 lb (48.1 kg)  07/21/22 106 lb 1.9 oz (48.1 kg)    Physical Exam Vitals and nursing note reviewed.  Constitutional:      Appearance: Normal appearance. She is well-developed.  HENT:     Head: Normocephalic and atraumatic.     Nose: Nose normal.     Mouth/Throat:     Mouth: Mucous membranes are moist.     Pharynx: Oropharynx is clear.  Eyes:     Extraocular Movements: Extraocular movements intact.     Conjunctiva/sclera: Conjunctivae normal.     Pupils: Pupils are equal, round, and reactive to light.  Neck:     Vascular: No carotid bruit.  Cardiovascular:     Rate and Rhythm: Normal rate and regular rhythm.     Pulses: Normal pulses.     Heart sounds: Normal heart sounds.  Pulmonary:     Effort: Pulmonary effort is normal.     Breath sounds: Normal breath sounds.  Abdominal:     Palpations: Abdomen is soft.  Genitourinary:    Comments: Irritable show increase WBC and trace protein. Musculoskeletal:        General: Normal range of motion.     Cervical back: Normal range of motion and neck supple.  Lymphadenopathy:     Cervical: No cervical adenopathy.  Skin:    General: Skin is warm and dry.     Capillary Refill: Capillary refill takes less than 2 seconds.  Neurological:     General: No focal deficit present.     Mental Status: She is alert and oriented to person, place, and time.  Psychiatric:        Mood and Affect: Mood normal.        Behavior: Behavior normal.        Thought Content: Thought content normal.        Judgment: Judgment normal.     Results for orders placed or performed in visit on 09/22/22  Urine Culture   Specimen: Urine   Urine  Result Value Ref Range   Urine Culture, Routine Final report    Organism ID,  Bacteria Comment   POCT URINALYSIS DIP (CLINITEK)  Result Value Ref Range   Color, UA yellow yellow   Clarity, UA clear clear   Glucose, UA negative negative mg/dL   Bilirubin, UA negative negative   Ketones, POC UA negative negative mg/dL   Spec Grav,  UA 1.015 1.010 - 1.025   Blood, UA negative negative   pH, UA 7.0 5.0 - 8.0   POC PROTEIN,UA trace negative, trace   Urobilinogen, UA 1.0 0.2 or 1.0 E.U./dL   Nitrite, UA Negative Negative   Leukocytes, UA Trace (A) Negative    Assessment & Plan    Urinary tract infection without hematuria, site unspecified Assessment & Plan: Start Bactrim DS twice daily for next 7 days. Send urine for culture and sensitivity and adjust antibiotics as indicated. Recommend she increase water intake. Recommend OTC Azo to help with bladder pain and spasms.  Orders: -     POCT URINALYSIS DIP (CLINITEK) -     Urine Culture; Future -     Sulfamethoxazole-Trimethoprim; Take 1 tablet by mouth 2 (two) times daily.  Dispense: 14 tablet; Refill: 0  Scalp psoriasis Assessment & Plan: Trial ultravate cream twice weekly as needed.   Orders: -     Halobetasol Propionate; Apply topically 2 (two) times daily.  Dispense: 50 g; Refill: 2  Primary hypertension Assessment & Plan: BP stable.  Continue current medication.    Chronic obstructive pulmonary disease, unspecified COPD type (HCC) Assessment & Plan: Stable.  -continue inhalers and respiratory medications as prescribed.    Mixed hyperlipidemia Assessment & Plan: Continue lipid-lowering medication as prescribed.      Return in about 4 months (around 01/22/2023) for health maintenance exam, due for MWV .         Carlean Jews, NP  [facility] at [facility] [phone] (phone) [phone] (fax)  [facility]

## 2022-09-23 NOTE — Telephone Encounter (Unsigned)
Pt was scheduled for an office visit to see Gina Meeker PA on  09/30/2022 at 9:30 AM  Left message for pt to call back

## 2022-09-24 LAB — URINE CULTURE

## 2022-09-24 NOTE — Telephone Encounter (Signed)
Left message for pt to call back  °

## 2022-09-25 NOTE — Telephone Encounter (Signed)
Spoke with patient & confirmed appt for next week 09/30/22 at 9:30 am with Victorino Dike, [place]. Pt states she is doing better since we last spoke on Friday. States stools are not as dark and more formed. Denies any pain. She will call back with any further questions/concerns.

## 2022-09-29 NOTE — Assessment & Plan Note (Signed)
Stable.  -continue inhalers and respiratory medications as prescribed.  

## 2022-09-29 NOTE — Assessment & Plan Note (Signed)
Trial ultravate cream twice weekly as needed.

## 2022-09-29 NOTE — Assessment & Plan Note (Signed)
BP stable.  Continue current medication. 

## 2022-09-29 NOTE — Assessment & Plan Note (Signed)
Continue lipid-lowering medication as prescribed. 

## 2022-09-29 NOTE — Assessment & Plan Note (Signed)
Start Bactrim DS twice daily for next 7 days. Send urine for culture and sensitivity and adjust antibiotics as indicated. Recommend she increase water intake. Recommend OTC Azo to help with bladder pain and spasms.

## 2022-09-30 ENCOUNTER — Ambulatory Visit (INDEPENDENT_AMBULATORY_CARE_PROVIDER_SITE_OTHER): Payer: Medicare HMO | Admitting: Physician Assistant

## 2022-09-30 ENCOUNTER — Encounter: Payer: Self-pay | Admitting: Physician Assistant

## 2022-09-30 ENCOUNTER — Other Ambulatory Visit (INDEPENDENT_AMBULATORY_CARE_PROVIDER_SITE_OTHER): Payer: Medicare HMO

## 2022-09-30 VITALS — BP 140/50 | HR 72 | Ht 62.75 in | Wt 103.2 lb

## 2022-09-30 DIAGNOSIS — R195 Other fecal abnormalities: Secondary | ICD-10-CM

## 2022-09-30 LAB — CBC WITH DIFFERENTIAL/PLATELET
Basophils Absolute: 0 10*3/uL (ref 0.0–0.1)
Basophils Relative: 0.6 % (ref 0.0–3.0)
Eosinophils Absolute: 0.2 10*3/uL (ref 0.0–0.7)
Eosinophils Relative: 3.7 % (ref 0.0–5.0)
HCT: 40.9 % (ref 36.0–46.0)
Hemoglobin: 13.4 g/dL (ref 12.0–15.0)
Lymphocytes Relative: 28.1 % (ref 12.0–46.0)
Lymphs Abs: 1.3 10*3/uL (ref 0.7–4.0)
MCHC: 32.9 g/dL (ref 30.0–36.0)
MCV: 92 fl (ref 78.0–100.0)
Monocytes Absolute: 0.5 10*3/uL (ref 0.1–1.0)
Monocytes Relative: 10.9 % (ref 3.0–12.0)
Neutro Abs: 2.7 10*3/uL (ref 1.4–7.7)
Neutrophils Relative %: 56.7 % (ref 43.0–77.0)
Platelets: 206 10*3/uL (ref 150.0–400.0)
RBC: 4.44 Mil/uL (ref 3.87–5.11)
RDW: 14.6 % (ref 11.5–15.5)
WBC: 4.7 10*3/uL (ref 4.0–10.5)

## 2022-09-30 NOTE — Progress Notes (Signed)
Chief Complaint: "Dark stools"  HPI:    [name] is an 87 year old female with a past medical history as listed below including prior duodenal ulcer, CAD (11/26/2021 echo with LVEF 55%, status post TAVR) and COPD as well as depression, known to Dr. Leone Payor, who was referred to me by Carlean Jews, NP for a complaint of "dark stools".      11/01/2020 EGD was normal.    09/16/2022 CBC with a normal hemoglobin.    09/19/2022 patient called and describes she was having dark stools and uncontrollable bowel movements.  Patient was told to go to the ED.  She did not go.    09/25/2022 patient called back and was not having dark stools anymore.    Today, patient presents with family member.  She tells me that for about a week at the end of June she was seeing black tarry sticky stools at least once a day.  Tells me she has always liked things that he like this can which was at some cool experienced this before with her duodenal ulcer and not worried.  She is still on her Pantoprazole 20 mg once daily and has had no increase in epigastric pain nausea, vomiting, heartburn or reflux.  She does continue on Aspirin as well.  Tells me that she was then found to have a UTI by her PCP and on Friday, 09/26/2022 her black stools stopped.  They are back to harder and brown.  She was just worried.    Denies fever, chills, weight loss, abdominal pain, dizziness or palpitations.  Past Medical History:  Diagnosis Date   Anxiety    Stable   ASCUS on Pap smear 11/15/2009   Carotid artery disease (HCC)    1-39% RICA stenosis and 60-79% LICA stenosis on pre TAVR dopplers    Cataract    COPD (chronic obstructive pulmonary disease) (HCC)    Depression    Duodenal ulcer due to Helicobacter pylori 06/28/2020   Endometrial polyp 11/15/2009   Endometrioid adenocarcinoma    H/O varicella    Heart murmur    Hemorrhoids    History of measles, mumps, or rubella    Hypercholesterolemia    Hypertension    Menopausal  symptoms 06/13/2010   Ovarian cyst 11/15/2009   PMB (postmenopausal bleeding) 11/15/2009   Presence of permanent cardiac pacemaker 10/20/2018   Vaginal bleeding     Past Surgical History:  Procedure Laterality Date   BIOPSY  06/19/2020   Procedure: BIOPSY;  Surgeon: Lemar Lofty., MD;  Location: [facility] ENDOSCOPY;  Service: Gastroenterology;;   BREAST SURGERY     Over twenty - five  years ago   CATARACT EXTRACTION W/ INTRAOCULAR LENS IMPLANT Bilateral    CERVICAL BIOPSY     ESOPHAGOGASTRODUODENOSCOPY (EGD) WITH PROPOFOL N/A 06/19/2020   Procedure: ESOPHAGOGASTRODUODENOSCOPY (EGD) WITH PROPOFOL;  Surgeon: Lemar Lofty., MD;  Location: [facility] ENDOSCOPY;  Service: Gastroenterology;  Laterality: N/A;   EYE SURGERY     INSERT / REPLACE / REMOVE PACEMAKER  10/20/2018   LAPAROSCOPIC ASSISTED VAGINAL HYSTERECTOMY  12/2009   BSO   MULTIPLE EXTRACTIONS WITH ALVEOLOPLASTY N/A 12/09/2018   Procedure: EXTRACTION OF TOOTH #'S 6,21-29 AND 32 WITH ALVEOLOPLASTY AND BILATERAL MANDIBULAR TORI REDUCTIONS;  Surgeon: Charlynne Pander, DDS;  Location: [facility] OR;  Service: Oral Surgery;  Laterality: N/A;   PACEMAKER IMPLANT N/A 10/20/2018   Procedure: PACEMAKER IMPLANT;  Surgeon: Duke Salvia, MD;  Location: [facility] INVASIVE CV LAB;  Service: Cardiovascular;  Laterality:  N/A;   RIGHT/LEFT HEART CATH AND CORONARY ANGIOGRAPHY N/A 11/24/2018   Procedure: RIGHT/LEFT HEART CATH AND CORONARY ANGIOGRAPHY;  Surgeon: Kathleene Hazel, MD;  Location: [facility] INVASIVE CV LAB;  Service: Cardiovascular;  Laterality: N/A;   TEE WITHOUT CARDIOVERSION N/A 12/21/2018   Procedure: TRANSESOPHAGEAL ECHOCARDIOGRAM (TEE);  Surgeon: Kathleene Hazel, MD;  Location: [facility] OR;  Service: Open Heart Surgery;  Laterality: N/A;    Current Outpatient Medications  Medication Sig Dispense Refill   acetaminophen (TYLENOL) 325 MG tablet Take 2 tablets (650 mg total) by mouth every 6 (six) hours as needed for moderate pain. 30 tablet  0   albuterol (VENTOLIN HFA) 108 (90 Base) MCG/ACT inhaler INHALE 2 PUFFS BY MOUTH EVERY 4 HOURS AS NEEDED FOR WHEEZING FOR SHORTNESS OF BREATH 18 g 0   amLODipine (NORVASC) 10 MG tablet Take 1 tablet (10 mg total) by mouth daily. Please schedule appointment for future refills. Thank you 90 tablet 3   aspirin 81 MG chewable tablet Chew 1 tablet (81 mg total) by mouth daily.     carboxymethylcellulose (REFRESH PLUS) 0.5 % SOLN Place 1 drop into both eyes daily as needed (dry eyes).     Cholecalciferol (VITAMIN D-3) 125 MCG (5000 UT) TABS Take 5,000 Units by mouth daily.     citalopram (CELEXA) 40 MG tablet Take 0.5 tablets (20 mg total) by mouth daily. 90 tablet 1   furosemide (LASIX) 20 MG tablet Take 1 tablet by mouth once daily 90 tablet 1   halobetasol (ULTRAVATE) 0.05 % cream Apply topically 2 (two) times daily. 50 g 2   hydrOXYzine (VISTARIL) 50 MG capsule Take 1 capsule (50 mg total) by mouth 3 (three) times daily as needed for anxiety. 180 capsule 1   Omega-3 Fatty Acids (FISH OIL) 1000 MG CAPS Take 1,000 mg by mouth daily.     pantoprazole (PROTONIX) 20 MG tablet Take 1 tablet by mouth once daily 90 tablet 0   simvastatin (ZOCOR) 20 MG tablet Take 1 tablet by mouth once daily 90 tablet 0   sulfamethoxazole-trimethoprim (BACTRIM DS) 800-160 MG tablet Take 1 tablet by mouth 2 (two) times daily. 14 tablet 0   umeclidinium-vilanterol (ANORO ELLIPTA) 62.5-25 MCG/ACT AEPB Inhale 1 puff by mouth once daily 60 each 11   vitamin B-12 (CYANOCOBALAMIN) 500 MCG tablet Take 500 mcg by mouth daily.     vitamin E 180 MG (400 UNITS) capsule Take 400 Units by mouth daily.     No current facility-administered medications for this visit.    Allergies as of 09/30/2022   (No Known Allergies)    Family History  Problem Relation Age of Onset   Heart failure Mother    Parkinson's disease Father    Lung cancer Brother    Heart disease Brother    Colon cancer Neg Hx    Colon polyps Neg Hx     Esophageal cancer Neg Hx     Social History   Socioeconomic History   Marital status: Widowed    Spouse name: Not on file   Number of children: Not on file   Years of education: Not on file   Highest education level: Not on file  Occupational History   Occupation: Retired-sales rep  Tobacco Use   Smoking status: Every Day    Packs/day: 0.25    Years: 55.00    Additional pack years: 0.00    Total pack years: 13.75    Types: Cigarettes   Smokeless tobacco: Never   Tobacco  comments:    5-6 cigarettes smoke daily HEI 03/27/2022  Vaping Use   Vaping Use: Never used  Substance and Sexual Activity   Alcohol use: No   Drug use: Not Currently   Sexual activity: Not Currently  Other Topics Concern   Not on file  Social History Narrative   Not on file   Social Determinants of Health   Financial Resource Strain: Low Risk  (08/05/2022)   Overall Financial Resource Strain (CARDIA)    Difficulty of Paying Living Expenses: Not hard at all  Food Insecurity: No Food Insecurity (08/05/2022)   Hunger Vital Sign    Worried About Running Out of Food in the Last Year: Never true    Ran Out of Food in the Last Year: Never true  Transportation Needs: No Transportation Needs (08/05/2022)   PRAPARE - Administrator, Civil Service (Medical): No    Lack of Transportation (Non-Medical): No  Physical Activity: Inactive (08/05/2022)   Exercise Vital Sign    Days of Exercise per Week: 0 days    Minutes of Exercise per Session: 0 min  Stress: No Stress Concern Present (08/05/2022)   [employer] of Occupational Health - Occupational Stress Questionnaire    Feeling of Stress : Not at all  Social Connections: Moderately Integrated (08/05/2022)   Social Connection and Isolation Panel [NHANES]    Frequency of Communication with Friends and Family: More than three times a week    Frequency of Social Gatherings with Friends and Family: More than three times a week    Attends Religious Services:  More than 4 times per year    Active Member of [employer] or Organizations: Yes    Attends Banker Meetings: More than 4 times per year    Marital Status: Widowed  Intimate Partner Violence: Not At Risk (08/05/2022)   Humiliation, Afraid, Rape, and Kick questionnaire    Fear of Current or Ex-Partner: No    Emotionally Abused: No    Physically Abused: No    Sexually Abused: No    Review of Systems:    Constitutional: No weight loss, fever or chills Skin: No rash Cardiovascular: No chest pain  Respiratory: No SOB  Gastrointestinal: See HPI and otherwise negative Genitourinary: No dysuria Neurological: No headache, dizziness or syncope Musculoskeletal: No new muscle or joint pain Hematologic: No bruising Psychiatric: No history of depression or anxiety    Physical Exam:  Vital signs: BP (!) 140/50 (BP Location: Left Arm, Patient Position: Sitting, Cuff Size: Normal)   Pulse 72   Ht 5' 2.75" (1.594 m)   Wt 103 lb 4 oz (46.8 kg)   BMI 18.44 kg/m    Constitutional:   Pleasant elderly Caucasian female appears to be in NAD, Well developed, Well nourished, alert and cooperative  Respiratory: Respirations even and unlabored. Lungs clear to auscultation bilaterally.   No wheezes, crackles, or rhonchi.  Cardiovascular: Normal S1, S2. No MRG. Regular rate and rhythm. No peripheral edema, cyanosis or pallor.  Gastrointestinal:  Soft, nondistended, nontender. No rebound or guarding. Normal bowel sounds. No appreciable masses or hepatomegaly. Psychiatric: Oriented to person, place and time. Demonstrates good judgement and reason without abnormal affect or behaviors.  RELEVANT LABS AND IMAGING: CBC    Component Value Date/Time   WBC 4.2 09/16/2022 1001   WBC 5.4 10/30/2020 1604   RBC 4.68 09/16/2022 1001   RBC 4.46 10/30/2020 1604   HGB 14.0 09/16/2022 1001   HCT 42.4 09/16/2022 1001  PLT 145 (L) 09/16/2022 1001   MCV 91 09/16/2022 1001   MCH 29.9 09/16/2022 1001   MCH 30.1  07/06/2020 0308   MCHC 33.0 09/16/2022 1001   MCHC 33.4 10/30/2020 1604   RDW 13.5 09/16/2022 1001   LYMPHSABS 1.5 09/02/2021 0901   MONOABS 0.6 07/06/2020 0308   EOSABS 0.3 09/02/2021 0901   BASOSABS 0.0 09/02/2021 0901    CMP     Component Value Date/Time   NA 140 09/16/2022 1001   K 4.0 09/16/2022 1001   CL 103 09/16/2022 1001   CO2 23 09/16/2022 1001   GLUCOSE 86 09/16/2022 1001   GLUCOSE 118 (H) 07/04/2020 1319   BUN 20 09/16/2022 1001   CREATININE 0.81 09/16/2022 1001   CALCIUM 9.3 09/16/2022 1001   PROT 6.2 09/16/2022 1001   ALBUMIN 4.1 09/16/2022 1001   AST 20 09/16/2022 1001   ALT 5 09/16/2022 1001   ALKPHOS 79 09/16/2022 1001   BILITOT 0.4 09/16/2022 1001   GFRNONAA >60 07/04/2020 1319   GFRAA 88 01/11/2019 1014    Assessment: 1.  Dark stools: Dark tarry looking stools for about a week 2 weeks ago, stopped 4 days ago, prior history of duodenal ulcer in 2022, remains on Pantoprazole 20 mg daily, also still on a baby aspirin for cardiac reasons; consider upper GI bleed versus other 2.  History of duodenal ulcer  Plan: 1.  Ordered CBC today. 2.  Discussed the patient could increase her Pantoprazole to 20 mg twice daily if she starts seeing the stools again. 3.  Patient to follow in clinic with [place] per recommendations after time of labs.  Hyacinth Meeker, PA-C  Gastroenterology 09/30/2022, 9:42 AM  Cc: Carlean Jews, NP

## 2022-09-30 NOTE — Patient Instructions (Signed)
Your provider has requested that you go to the basement level for lab work before leaving today. Press "B" on the elevator. The lab is located at the first door on the left as you exit the elevator.   Follow up recommendations after labs  Due to recent changes in healthcare laws, you may see the results of your imaging and laboratory studies on MyChart before your provider has had a chance to review them.  We understand that in some cases there may be results that are confusing or concerning to you. Not all laboratory results come back in the same time frame and the provider may be waiting for multiple results in order to interpret others.  Please give [place] 48 hours in order for your provider to thoroughly review all the results before contacting the office for clarification of your results.    _______________________________________________________  If your blood pressure at your visit was 140/90 or greater, please contact your primary care physician to follow up on this.  _______________________________________________________  If you are age 64 or older, your body mass index should be between 23-30. Your Body mass index is 18.44 kg/m. If this is out of the aforementioned range listed, please consider follow up with your Primary Care Provider.  If you are age 76 or younger, your body mass index should be between 19-25. Your Body mass index is 18.44 kg/m. If this is out of the aformentioned range listed, please consider follow up with your Primary Care Provider.   ________________________________________________________  The  GI providers would like to encourage you to use [facility] to communicate with providers for non-urgent requests or questions.  Due to long hold times on the telephone, sending your provider a message by [facility] may be a faster and more efficient way to get a response.  Please allow 48 business hours for a response.  Please remember that this is for non-urgent requests.   _______________________________________________________   I appreciate the  opportunity to care for you  Thank You   Jacelyn Grip

## 2022-10-21 ENCOUNTER — Ambulatory Visit (INDEPENDENT_AMBULATORY_CARE_PROVIDER_SITE_OTHER): Payer: Medicare HMO

## 2022-10-21 DIAGNOSIS — I442 Atrioventricular block, complete: Secondary | ICD-10-CM | POA: Diagnosis not present

## 2022-10-23 LAB — CUP PACEART REMOTE DEVICE CHECK
Battery Remaining Longevity: 110 mo
Battery Voltage: 2.99 V
Brady Statistic AS VP Percent: 98.97 %
Brady Statistic AS VS Percent: 0.07 %
Brady Statistic RV Percent Paced: 99.93 %
Date Time Interrogation Session: 20240723063658
Implantable Lead Connection Status: 753985
Implantable Lead Connection Status: 753985
Implantable Lead Implant Date: 20200722
Implantable Lead Implant Date: 20200722
Implantable Lead Location: 753859
Implantable Lead Model: 1944
Implantable Lead Model: 1948
Implantable Pulse Generator Implant Date: 20200722
Lead Channel Impedance Value: 380 Ohm
Lead Channel Impedance Value: 437 Ohm
Lead Channel Impedance Value: 513 Ohm
Lead Channel Impedance Value: 646 Ohm
Lead Channel Pacing Threshold Amplitude: 0.5 V
Lead Channel Pacing Threshold Amplitude: 0.5 V
Lead Channel Pacing Threshold Pulse Width: 0.4 ms
Lead Channel Sensing Intrinsic Amplitude: 2.125 mV
Lead Channel Sensing Intrinsic Amplitude: 2.125 mV
Lead Channel Sensing Intrinsic Amplitude: 5 mV
Lead Channel Sensing Intrinsic Amplitude: 9.75 mV
Lead Channel Setting Pacing Amplitude: 1.5 V
Lead Channel Setting Pacing Amplitude: 2 V
Lead Channel Setting Pacing Pulse Width: 0.4 ms
Lead Channel Setting Sensing Sensitivity: 2 mV
Zone Setting Status: 755011
Zone Setting Status: 755011

## 2022-10-27 ENCOUNTER — Other Ambulatory Visit: Payer: Self-pay | Admitting: Nurse Practitioner

## 2022-10-27 DIAGNOSIS — J449 Chronic obstructive pulmonary disease, unspecified: Secondary | ICD-10-CM

## 2022-10-31 ENCOUNTER — Ambulatory Visit
Admission: EM | Admit: 2022-10-31 | Discharge: 2022-10-31 | Disposition: A | Discharge status: Home / Self Care | Payer: Medicare HMO | Attending: Physician Assistant | Admitting: Physician Assistant

## 2022-10-31 DIAGNOSIS — R6889 Other general symptoms and signs: Secondary | ICD-10-CM | POA: Insufficient documentation

## 2022-10-31 DIAGNOSIS — Z1152 Encounter for screening for COVID-19: Secondary | ICD-10-CM | POA: Insufficient documentation

## 2022-10-31 DIAGNOSIS — N3000 Acute cystitis without hematuria: Secondary | ICD-10-CM | POA: Insufficient documentation

## 2022-10-31 DIAGNOSIS — J441 Chronic obstructive pulmonary disease with (acute) exacerbation: Secondary | ICD-10-CM | POA: Diagnosis not present

## 2022-10-31 DIAGNOSIS — F17210 Nicotine dependence, cigarettes, uncomplicated: Secondary | ICD-10-CM | POA: Insufficient documentation

## 2022-10-31 DIAGNOSIS — R059 Cough, unspecified: Secondary | ICD-10-CM | POA: Diagnosis present

## 2022-10-31 DIAGNOSIS — R0602 Shortness of breath: Secondary | ICD-10-CM | POA: Diagnosis present

## 2022-10-31 LAB — POCT URINALYSIS DIP (MANUAL ENTRY)
Bilirubin, UA: NEGATIVE
Glucose, UA: NEGATIVE mg/dL
Ketones, POC UA: NEGATIVE mg/dL
Nitrite, UA: NEGATIVE
Protein Ur, POC: 100 mg/dL — AB
Spec Grav, UA: 1.02
Urobilinogen, UA: 1 U/dL
pH, UA: 5.5

## 2022-10-31 LAB — POCT FASTING CBG KUC MANUAL ENTRY: POCT Glucose (KUC): 162 mg/dL — AB (ref 70–99)

## 2022-10-31 LAB — POCT INFLUENZA A/B
Influenza A, POC: NEGATIVE
Influenza B, POC: NEGATIVE

## 2022-10-31 MED ORDER — AMOXICILLIN-POT CLAVULANATE 875-125 MG PO TABS: 1.0000 | ORAL_TABLET | Freq: Two times a day (BID) | ORAL | 0 refills | Status: DC

## 2022-10-31 MED ORDER — PREDNISONE 20 MG PO TABS: 40.0000 mg | ORAL_TABLET | Freq: Every day | ORAL | 0 refills | Status: AC

## 2022-10-31 NOTE — ED Provider Notes (Signed)
[name]    CSN: [id number] Arrival date & time: 10/31/22  1403      History   Chief Complaint Chief Complaint  Patient presents with   Flu like Symptoms   Weakness    HPI [name] is a 87 y.o. female.   Patient here today for evaluation of generalized weakness, fever, cough and congestion with shortness of breath that started about 10 days ago.  She states she does have chills at times.  She took a COVID test several days ago that was negative.  She has not had any vomiting or diarrhea.  She does have underlying COPD.  She has been using medications as prescribed.  The history is provided by the patient.    Past Medical History:  Diagnosis Date   Anxiety    Stable   ASCUS on Pap smear 11/15/2009   Carotid artery disease (HCC)    1-39% RICA stenosis and 60-79% LICA stenosis on pre TAVR dopplers    Cataract    COPD (chronic obstructive pulmonary disease) (HCC)    Depression    Duodenal ulcer due to Helicobacter pylori 06/28/2020   Endometrial polyp 11/15/2009   Endometrioid adenocarcinoma    H/O varicella    Heart murmur    Hemorrhoids    History of measles, mumps, or rubella    Hypercholesterolemia    Hypertension    Menopausal symptoms 06/13/2010   Ovarian cyst 11/15/2009   PMB (postmenopausal bleeding) 11/15/2009   Presence of permanent cardiac pacemaker 10/20/2018   Vaginal bleeding     Patient Active Problem List   Diagnosis Date Noted   Urinary tract infection without hematuria 08/14/2022   Frequency of urination 08/14/2022   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   NSVT (nonsustained ventricular tachycardia) (HCC) 01/06/2022   Conductive hearing loss, bilateral 09/12/2021   Generalized anxiety disorder 08/11/2021   Tobacco use 10/18/2020   COPD (chronic obstructive pulmonary disease) (HCC) 10/18/2020   Pacemaker-MDT 09/13/2020   Generalized weakness 07/04/2020   Dark stools 07/04/2020   DOE (dyspnea on exertion) 07/04/2020   Duodenal  ulcer due to Helicobacter pylori 06/19/2020   Acute blood loss anemia 06/18/2020   HTN (hypertension) 06/18/2020   HLD (hyperlipidemia) 06/18/2020   Carotid arterial disease (HCC) 06/18/2020   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   Severe aortic valve stenosis    Complete heart block (HCC) 10/20/2018   Endometrial cancer (HCC) 06/12/2011    Past Surgical History:  Procedure Laterality Date   BIOPSY  06/19/2020   Procedure: BIOPSY;  Surgeon: Lemar Lofty., MD;  Location: [facility] ENDOSCOPY;  Service: Gastroenterology;;   BREAST SURGERY     Over twenty - five  years ago   CATARACT EXTRACTION W/ INTRAOCULAR LENS IMPLANT Bilateral    CERVICAL BIOPSY     ESOPHAGOGASTRODUODENOSCOPY (EGD) WITH PROPOFOL N/A 06/19/2020   Procedure: ESOPHAGOGASTRODUODENOSCOPY (EGD) WITH PROPOFOL;  Surgeon: Lemar Lofty., MD;  Location: [facility] ENDOSCOPY;  Service: Gastroenterology;  Laterality: N/A;   EYE SURGERY     INSERT / REPLACE / REMOVE PACEMAKER  10/20/2018   LAPAROSCOPIC ASSISTED VAGINAL HYSTERECTOMY  12/2009   BSO   MULTIPLE EXTRACTIONS WITH ALVEOLOPLASTY N/A 12/09/2018   Procedure: EXTRACTION OF TOOTH #'S 6,21-29 AND 32 WITH ALVEOLOPLASTY AND BILATERAL MANDIBULAR TORI REDUCTIONS;  Surgeon: Charlynne Pander, DDS;  Location: [facility] OR;  Service: Oral Surgery;  Laterality: N/A;   PACEMAKER IMPLANT N/A 10/20/2018   Procedure: PACEMAKER IMPLANT;  Surgeon: Duke Salvia, MD;  Location: [facility]  INVASIVE CV LAB;  Service: Cardiovascular;  Laterality: N/A;   RIGHT/LEFT HEART CATH AND CORONARY ANGIOGRAPHY N/A 11/24/2018   Procedure: RIGHT/LEFT HEART CATH AND CORONARY ANGIOGRAPHY;  Surgeon: Kathleene Hazel, MD;  Location: [facility] INVASIVE CV LAB;  Service: Cardiovascular;  Laterality: N/A;   TEE WITHOUT CARDIOVERSION N/A 12/21/2018   Procedure: TRANSESOPHAGEAL ECHOCARDIOGRAM (TEE);  Surgeon: Kathleene Hazel, MD;  Location: [facility] OR;  Service: Open Heart Surgery;  Laterality: N/A;     OB History   No obstetric history on file.      Home Medications    Prior to Admission medications   Medication Sig Start Date End Date Taking? Authorizing Provider  amoxicillin-clavulanate (AUGMENTIN) 875-125 MG tablet Take 1 tablet by mouth every 12 (twelve) hours. 10/31/22  Yes Tomi Bamberger, PA-C  predniSONE (DELTASONE) 20 MG tablet Take 2 tablets (40 mg total) by mouth daily with breakfast for 5 days. 10/31/22 11/05/22 Yes Tomi Bamberger, PA-C  acetaminophen (TYLENOL) 325 MG tablet Take 2 tablets (650 mg total) by mouth every 6 (six) hours as needed for moderate pain. 07/26/21   Wallis Bamberg, PA-C  albuterol (VENTOLIN HFA) 108 (90 Base) MCG/ACT inhaler INHALE 2 PUFFS BY MOUTH EVERY 4 HOURS AS NEEDED FOR WHEEZING FOR SHORTNESS OF BREATH 10/27/22   Sandre Kitty, MD  amLODipine (NORVASC) 10 MG tablet Take 1 tablet (10 mg total) by mouth daily. Please schedule appointment for future refills. Thank you 10/31/21   Janetta Hora, PA-C  aspirin 81 MG chewable tablet Chew 1 tablet (81 mg total) by mouth daily. 12/23/18   Barrett, Rae Roam, PA-C  carboxymethylcellulose (REFRESH PLUS) 0.5 % SOLN Place 1 drop into both eyes daily as needed (dry eyes).    [provider]  Cholecalciferol (VITAMIN D-3) 125 MCG (5000 UT) TABS Take 5,000 Units by mouth daily.    [provider]  citalopram (CELEXA) 40 MG tablet Take 0.5 tablets (20 mg total) by mouth daily. 01/16/22   Carlean Jews, NP  furosemide (LASIX) 20 MG tablet Take 1 tablet by mouth once daily 02/10/22   Kathleene Hazel, MD  halobetasol (ULTRAVATE) 0.05 % cream Apply topically 2 (two) times daily. 09/22/22   Carlean Jews, NP  hydrOXYzine (VISTARIL) 50 MG capsule Take 1 capsule (50 mg total) by mouth 3 (three) times daily as needed for anxiety. 01/16/22   Carlean Jews, NP  Omega-3 Fatty Acids (FISH OIL) 1000 MG CAPS Take 1,000 mg by mouth daily.    [provider]  pantoprazole (PROTONIX) 20 MG  tablet Take 1 tablet by mouth once daily 08/11/22   Iva Boop, MD  simvastatin (ZOCOR) 20 MG tablet Take 1 tablet by mouth once daily 08/11/22   Carlean Jews, NP  umeclidinium-vilanterol (ANORO ELLIPTA) 62.5-25 MCG/ACT AEPB Inhale 1 puff by mouth once daily 03/27/22   Leslye Peer, MD  vitamin B-12 (CYANOCOBALAMIN) 500 MCG tablet Take 500 mcg by mouth daily.    [provider]  vitamin E 180 MG (400 UNITS) capsule Take 400 Units by mouth daily.    [provider]    Family History Family History  Problem Relation Age of Onset   Heart failure Mother    Parkinson's disease Father    Lung cancer Brother    Heart disease Brother    Colon cancer Neg Hx    Colon polyps Neg Hx    Esophageal cancer Neg Hx     Social History Social History  Tobacco Use   Smoking status: Every Day    Current packs/day: 0.25    Average packs/day: 0.3 packs/day for 55.0 years (13.8 ttl pk-yrs)    Types: Cigarettes   Smokeless tobacco: Never   Tobacco comments:    5-6 cigarettes smoke daily HEI 03/27/2022  Vaping Use   Vaping status: Never Used  Substance Use Topics   Alcohol use: No   Drug use: Not Currently     Allergies   Patient has no known allergies.   Review of Systems Review of Systems  Constitutional:  Positive for chills and fever.  HENT:  Positive for congestion and sore throat. Negative for ear pain.   Eyes:  Negative for discharge and redness.  Respiratory:  Positive for cough, shortness of breath and wheezing.   Gastrointestinal:  Negative for abdominal pain, diarrhea, nausea and vomiting.     Physical Exam Triage Vital Signs ED Triage Vitals  Encounter Vitals Group     BP      Systolic BP Percentile      Diastolic BP Percentile      Pulse      Resp      Temp      Temp src      SpO2      Weight      Height      Head Circumference      Peak Flow      Pain Score      Pain Loc      Pain Education      Exclude from Growth Chart    No  data found.  Updated Vital Signs BP 129/61 (BP Location: Left Arm)   Pulse 89   Temp 98.3 F (36.8 C) (Oral)   Resp (!) 22   Ht 5' 2.75" (1.594 m)   Wt 103 lb (46.7 kg)   SpO2 92%   BMI 18.39 kg/m      Physical Exam Vitals and nursing note reviewed.  Constitutional:      General: She is not in acute distress.    Appearance: Normal appearance. She is not ill-appearing.  HENT:     Head: Normocephalic and atraumatic.     Right Ear: Tympanic membrane normal.     Left Ear: Tympanic membrane normal.     Nose: Congestion present.     Mouth/Throat:     Mouth: Mucous membranes are moist.     Pharynx: No oropharyngeal exudate or posterior oropharyngeal erythema.  Eyes:     Conjunctiva/sclera: Conjunctivae normal.  Cardiovascular:     Rate and Rhythm: Normal rate and regular rhythm.     Heart sounds: Normal heart sounds. No murmur heard. Pulmonary:     Effort: Pulmonary effort is normal. No respiratory distress.     Breath sounds: Wheezing present. No rhonchi or rales.     Comments: Breath sounds diminished throughout with rare wheeze noted. Skin:    General: Skin is warm and dry.  Neurological:     Mental Status: She is alert.  Psychiatric:        Mood and Affect: Mood normal.        Thought Content: Thought content normal.      UC Treatments / Results  Labs (all labs ordered are listed, but only abnormal results are displayed) Labs Reviewed  POCT FASTING CBG [facility] MANUAL ENTRY - Abnormal; Notable for the following components:      Result Value   POCT Glucose ([facility]) 162 (*)  All other components within normal limits  POCT URINALYSIS DIP (MANUAL ENTRY) - Abnormal; Notable for the following components:   Blood, UA moderate (*)    Protein Ur, POC =100 (*)    Leukocytes, UA Small (1+) (*)    All other components within normal limits  SARS CORONAVIRUS 2 (TAT 6-24 HRS)  URINE CULTURE  POCT INFLUENZA A/B    EKG   Radiology No results  found.  Procedures Procedures (including critical care time)  Medications Ordered in UC Medications - No data to display  Initial Impression / Assessment and Plan / UC Course  I have reviewed the triage vital signs and the nursing notes.  Pertinent labs & imaging results that were available during my care of the patient were reviewed by me and considered in my medical decision making (see chart for details).    Oxygen saturation initially of low, supplemental oxygen applied in office with improvement.  Before discharge patient was trialed without oxygen supplementation and oxygen saturation ranged from 90 to 92%.  Will treat with steroids and antibiotics to cover possible COPD exacerbation.  Discussed possibility of COVID and will screen for same.  UA with leukocytes noted, Augmentin should cover both UTI and upper respiratory concerns.  Encouraged follow-up if no gradual improvement or with any worsening symptoms or further concerns.  Final Clinical Impressions(s) / UC Diagnoses   Final diagnoses:  Flu-like symptoms  COPD exacerbation (HCC)  Acute cystitis without hematuria   Discharge Instructions   None    ED Prescriptions     Medication Sig Dispense Auth. Provider   amoxicillin-clavulanate (AUGMENTIN) 875-125 MG tablet Take 1 tablet by mouth every 12 (twelve) hours. 14 tablet Erma Pinto F, PA-C   predniSONE (DELTASONE) 20 MG tablet Take 2 tablets (40 mg total) by mouth daily with breakfast for 5 days. 10 tablet Tomi Bamberger, PA-C      PDMP not reviewed this encounter.   Tomi Bamberger, PA-C 10/31/22 [phone]

## 2022-10-31 NOTE — ED Triage Notes (Signed)
"  Started about 10 days ago with weakness, fever on/off, cough, congestion, some sob at times, chills". Last known Fever "I don't know, hair was wet this morning on waking up, so maybe last night". PO's "good". Voids "fine". Stools "normal".   Home COVID19 test Negative "10-24-2022".

## 2022-11-03 ENCOUNTER — Other Ambulatory Visit: Payer: Self-pay | Admitting: Nurse Practitioner

## 2022-11-03 ENCOUNTER — Telehealth: Payer: Self-pay | Admitting: Emergency Medicine

## 2022-11-03 ENCOUNTER — Other Ambulatory Visit: Payer: Self-pay | Admitting: Cardiovascular Disease

## 2022-11-03 ENCOUNTER — Other Ambulatory Visit: Payer: Self-pay | Admitting: Internal Medicine

## 2022-11-03 DIAGNOSIS — E782 Mixed hyperlipidemia: Secondary | ICD-10-CM

## 2022-11-03 NOTE — Telephone Encounter (Signed)
Patient has been sick lately. She is winded simply walking to the bathroom Urgent care saw her for bladder infection, but at that time her 02 was below 90. Believes she needs 02 at home just to keep her on track. LOV 03/27/22. Due for f/u in October (nothing sooner with RB), apps unavailable until mid-September. Please advise.

## 2022-11-03 NOTE — Telephone Encounter (Signed)
Called and spoke with pt, she states she had been dealing with a non productive cough, low oxygen levels and no other symptoms. Pt LOV 03/27/22 not due for a f/u until October. [name] would like to qualify for oxygen. She did had UC visit 8/2 and was treated with antibiotics and prednisone for ear infection. I have scheduled her an appointment with Dr.Byrum 12/31/22. NFN at this time.

## 2022-11-05 ENCOUNTER — Other Ambulatory Visit: Payer: Self-pay | Admitting: Nurse Practitioner

## 2022-11-05 DIAGNOSIS — E782 Mixed hyperlipidemia: Secondary | ICD-10-CM

## 2022-11-06 NOTE — Progress Notes (Signed)
Remote pacemaker transmission.   

## 2022-11-17 ENCOUNTER — Other Ambulatory Visit: Payer: Self-pay | Admitting: Physician Assistant

## 2022-12-22 ENCOUNTER — Other Ambulatory Visit: Payer: Self-pay | Admitting: Family Medicine

## 2022-12-22 DIAGNOSIS — J449 Chronic obstructive pulmonary disease, unspecified: Secondary | ICD-10-CM

## 2022-12-31 ENCOUNTER — Encounter: Payer: Self-pay | Admitting: Emergency Medicine

## 2022-12-31 ENCOUNTER — Ambulatory Visit (INDEPENDENT_AMBULATORY_CARE_PROVIDER_SITE_OTHER): Payer: Medicare HMO | Admitting: Emergency Medicine

## 2022-12-31 VITALS — BP 137/65 | HR 67 | Ht 63.5 in | Wt 102.6 lb

## 2022-12-31 DIAGNOSIS — J449 Chronic obstructive pulmonary disease, unspecified: Secondary | ICD-10-CM | POA: Diagnosis not present

## 2022-12-31 DIAGNOSIS — Z720 Tobacco use: Secondary | ICD-10-CM | POA: Diagnosis not present

## 2022-12-31 DIAGNOSIS — R0902 Hypoxemia: Secondary | ICD-10-CM

## 2022-12-31 NOTE — Patient Instructions (Signed)
VISIT SUMMARY:  During your visit, we discussed your recent COVID-19 infection and its residual symptoms, your chronic obstructive pulmonary disease (COPD), and your recent infections. We also noted changes in the skin on your lower extremities and discussed your general health maintenance.  YOUR PLAN:  -COPD AND RECENT COVID-19 INFECTION: You have been experiencing a persistent cough and congestion following your recent COVID-19 infection. To help with the cough, I have prescribed Tessalon Perles. It's important to continue reducing your smoking, as this can worsen your COPD. We will check on your progress in 3 months.  -INFECTION RISK: You recently had a urinary tract infection and pneumonia. If you notice any symptoms of infection, such as fever, chills, or increased cough, please contact the clinic immediately.  -IMMUNIZATIONS: Although you recently had a COVID-19 infection, it's important to protect yourself from other respiratory illnesses. We will defer the COVID-19 vaccine for 90 days due to your recent infection, but I recommend you get the flu shot and RSV vaccine.  -VENOUS STASIS: We noticed some changes in the color of your feet, which are likely due to poor blood flow in your veins (venous stasis). We did not discuss any specific interventions for this issue during this visit.  -GENERAL HEALTH MAINTENANCE: Continue taking your current medications, including the Anoro and Albuterol inhalers. For your sinus congestion, consider using nasal saline rinses or a nasal steroid.  INSTRUCTIONS:  Please remember to take your new medication, Tessalon Perles, for your cough. Continue reducing your smoking and taking your current medications. If you notice any symptoms of infection, contact the clinic immediately. In 90 days, consider getting the COVID-19 vaccine, and get the flu shot and RSV vaccine as soon as possible. For your sinus congestion, consider using nasal saline rinses or a nasal  steroid.

## 2022-12-31 NOTE — Assessment & Plan Note (Signed)
Transient hypoxemia noted when she is acutely ill, flaring.  She did not desaturate today.  Will need to continue to follow closely as I suspect she ultimately will qualify for oxygen

## 2022-12-31 NOTE — Progress Notes (Signed)
Subjective:    Patient ID: [name], female    DOB: 1936/03/29, 87 y.o.   MRN: [id number]  HPI  ROV 12/31/2022 -- The patient, an 87 year old with a history of tobacco use, moderately severe COPD, CAD, third-degree AV block with a pacemaker, hypertension, and TAVR for severe aortic stenosis (2020), presents three weeks post COVID-19 infection. She reports experiencing congestion, hot and cold flashes, and general malaise during the infection, but did not receive any additional medications for these symptoms. The patient also reports a persistent cough and residual congestion post-infection.  Approximately a month and a half prior to the COVID-19 infection, the patient experienced a flare-up of her COPD, which was treated with prednisone and amoxicillin. Around the same time, she also had a bladder infection, which led to a decrease in her oxygen levels to 88%. The patient has been monitoring her oxygen levels at home and reports occasional drops to 88%, but these do not persist.  The patient continues to smoke, albeit reduced to three cigarettes a day from a previous higher amount. She is on Anoro inhaler, which she reports taking reliably and noticing a difference in her breathing when she forgets to take it. She also uses albuterol at least twice a day, which she reports helps her.  The patient has been experiencing cough with white to yellowish mucus production, which she attributes to sinus drainage. She also reports a bad taste in her mouth. She is not currently on any medication for sinus issues. The patient also reports some changes in the color of her feet, which are attributed to venous stasis changes.    SATURATION QUALIFICATIONS performed today: (This note is used to comply with regulatory documentation for home oxygen) Patient Saturations on Room Air at Rest = 96% Patient Saturations on Room Air while Ambulating = 93% Patient Saturations on 0 Liters of oxygen while Ambulating =  93% Please briefly explain why patient needs home oxygen:   Review of Systems As per HPI     Objective:   Physical Exam Vitals:   12/31/22 1337  BP: 137/65  Pulse: 67  SpO2: 96%  Weight: 102 lb 9.6 oz (46.5 kg)  Height: 5' 3.5" (1.613 m)    Gen: Pleasant, very thin, in no distress,  normal affect  ENT: No lesions,  mouth clear,  oropharynx clear, no postnasal drip  Neck: No JVD, no stridor  Lungs: No use of accessory muscles, distant, no crackles or wheezing on normal respiration, no wheeze on forced expiration  Cardiovascular: RRR, distant, 2/6 M, , no peripheral edema  Musculoskeletal: No deformities, no cyanosis or clubbing  Neuro: alert, awake, non focal  Skin: Warm, no lesions or rash      Assessment & Plan:  COPD (chronic obstructive pulmonary disease) (HCC) COPD Recent COVID-19 infection with residual cough and congestion. History of tobacco use, currently smoking 3 cigarettes per day. Recent flare treated with prednisone and amoxicillin. Using Anoro and Albuterol inhalers regularly. -Prescribe Tessalon Perles for cough suppression. -Encourage continued reduction in smoking. -Return for follow-up in 3 months.  Infection Risk Recent urinary tract infection and pneumonia. -Advise patient to contact clinic if symptoms of infection occur.  Immunizations Recent COVID-19 infection, has not received flu shot or RSV vaccine. -Defer COVID-19 vaccine for 90 days due to recent infection. -Recommend receiving flu shot and RSV vaccine.  General Health Maintenance -Continue current medications including Anoro and Albuterol. -Consider nasal saline rinses or nasal steroid for possible sinus congestion.  Tobacco  use Discussed cessation today  Hypoxemia Transient hypoxemia noted when she is acutely ill, flaring.  She did not desaturate today.  Will need to continue to follow closely as I suspect she ultimately will qualify for oxygen   Levy Pupa, MD,  PhD 12/31/2022, 2:04 PM [facility] [phone] or if no answer before 7:00PM call [phone] For any issues after 7:00PM please call eLink [phone]

## 2022-12-31 NOTE — Assessment & Plan Note (Signed)
Discussed cessation today. 

## 2022-12-31 NOTE — Assessment & Plan Note (Signed)
COPD Recent COVID-19 infection with residual cough and congestion. History of tobacco use, currently smoking 3 cigarettes per day. Recent flare treated with prednisone and amoxicillin. Using Anoro and Albuterol inhalers regularly. -Prescribe Tessalon Perles for cough suppression. -Encourage continued reduction in smoking. -Return for follow-up in 3 months.  Infection Risk Recent urinary tract infection and pneumonia. -Advise patient to contact clinic if symptoms of infection occur.  Immunizations Recent COVID-19 infection, has not received flu shot or RSV vaccine. -Defer COVID-19 vaccine for 90 days due to recent infection. -Recommend receiving flu shot and RSV vaccine.  General Health Maintenance -Continue current medications including Anoro and Albuterol. -Consider nasal saline rinses or nasal steroid for possible sinus congestion.

## 2023-01-01 ENCOUNTER — Encounter: Payer: Self-pay | Admitting: Physician Assistant

## 2023-01-01 ENCOUNTER — Ambulatory Visit: Payer: Medicare HMO | Admitting: Physician Assistant

## 2023-01-01 ENCOUNTER — Ambulatory Visit: Payer: Medicare HMO | Attending: Physician Assistant | Admitting: Physician Assistant

## 2023-01-01 ENCOUNTER — Telehealth: Payer: Self-pay | Admitting: Emergency Medicine

## 2023-01-01 VITALS — BP 122/50 | HR 72 | Ht 63.5 in | Wt 102.8 lb

## 2023-01-01 DIAGNOSIS — Z952 Presence of prosthetic heart valve: Secondary | ICD-10-CM

## 2023-01-01 DIAGNOSIS — I5032 Chronic diastolic (congestive) heart failure: Secondary | ICD-10-CM

## 2023-01-01 DIAGNOSIS — I6523 Occlusion and stenosis of bilateral carotid arteries: Secondary | ICD-10-CM | POA: Diagnosis not present

## 2023-01-01 NOTE — Telephone Encounter (Signed)
Patient was in office yesterday and was supposed to receive a cough medication. Niece and patient checked at pharmacy and prescription was not there.    Pharmacy [facility] on [address].

## 2023-01-01 NOTE — Progress Notes (Signed)
Cardiology Office Note:  .   Date:  01/01/2023  ID:  [name], DOB Dec 09, 1935, MRN [id number] PCP: Carlean Jews, NP  [facility] Providers Cardiologist:  Verne Carrow, MD Electrophysiologist:  Sherryl Manges, MD {  History of Present Illness: [name] Kitchen   [name] is a 87 y.o. female with a PMH of hypertension, hyperlipidemia, mild nonobstructive CAD, severe AS status post TAVR 11/2018) mild-moderate AI by echo 06/2020), moderate pulmonary hypertension by echo 06/2020, high-grade AV block status post pacemaker 09/2018, chronic diastolic CHF, anxiety, depression, carotid artery disease (1 to 39% R ICA, 60 to 79% LICA 11/2019), Plavix allergy, endometrial adenocarcinoma 2011, longstanding tobacco abuse, and GI bleed who presents for follow-up appointment.  Admitted in 2020 for near syncope due to 2-1 AVB with intermittent CHB [name].  Is.  Underwent syncope PPM implant.  Cath 10/2018 showed nonobstructive CAD and she went to have TAVR 11/2019 with a 23 mm Edwards SAPIEN 3 ultra THV via this clinic approach.  Post Hospital course was notable for Plavix allergy so she has been on aspirin indefinitely.  Hospitalized 05/2020 for AVM anemia with melena secondary to UGIB.  EGD showed hiatal hernia, gastritis, nonbleeding duodenal ulcer with positive H. pylori.  Treated for this and cleared to resume her aspirin.  Admitted 4/6-/8/22 with SOB and black stools.  Hemoglobin was stable but not felt to require further procedures.  GI recommended holding antibiotics for H. pylori.  D-dimer was elevated but CTA was negative for PE and lower extremity venous duplex negative for DVT.  Cardiology consulted and possible CHF and mildly elevated troponin.  2D echo 07/06/2020 with LVEF 55%, apical hypokinesis, mild LVH, mildly enlarged RV, moderately eleva troponins were low and flat. Treated with IV lasix.   She was seen by Ronie Spies.  Continued to complain of SOB thought to be multifactorial COPD, anemia,  and CHF.  Felt to be euvolemic at that time after the diuretics recommended.  Hemoglobin was in the normal range.  Giving moderate aortic insufficiency.  Was discussed with multidisciplinary valve team and cardiac surgery consultation.  Repeat echo 10/09/2020 with LVEF 55 to 60%, mild MR, mild AI.  Followed by pulm firearm and is being treated for severe COPD.  Last seen a year ago by Carlean Jews for follow-up.  Complained of some leg weakness but no pain.  No chest pain.  No lower extremity edema, orthopnea, or PND.  No dizziness or syncope.  No blood in urine or stool.  No palpitations.  Does have fatigue and goes outside to work in her garden but takes frequent breaks.  Smoking 5 cigarettes a day.  Today, she tells me that she is having some shortness of breath going uphill and some weakness in her legs but this has been going on for several months.  She had COVID 3 weeks ago and it seems to be worse since then.  She recently also had a UTI and is on amoxicillin and prednisone.  Symptoms have improved.  She has had all of her COVID vaccination shots.  She is down to smoking 3 cigarettes a day.She sees Dr. Graciela Husbands in Nov. We will get her echo and carotid US scheduled today.  Reports no chest pain, pressure, or tightness. No edema, orthopnea, PND. Reports no palpitations.    ROS: Pertinent ROS in HPI  Studies Reviewed: [name] Kitchen   EKG Interpretation Date/Time:  Thursday January 01 2023 15:47:50 EDT Ventricular Rate:  69 PR Interval:  192 QRS  Duration:  174 QT Interval:  484 QTC Calculation: 518 R Axis:   -80  Text Interpretation: Atrial-sensed ventricular-paced rhythm When compared with ECG of 04-Jul-2020 19:28, PREVIOUS ECG IS PRESENT Confirmed by Jari Favre [phone]) on 01/01/2023 4:04:03 PM    Cardiac CT 08/01/2020 IMPRESSION: 1. Post TAVR with 23 mm Sapien 3 valve. Mechanism of AR would appear to be central valvular AR and not peri valvular or supra skirtal The is hypo attenuation and thickening  of the leaflets at the   Basal commissures with central area of mal-coaptation The stent valve itself dose not appear to be over expanded and was not post dilated during the procedure   2. Coronary arteries reside above/at upper edge of stent valve with good lumen   3.  Pacing wires noted in RA/RV   4.  Normal aortic root 2.8 cm   5. Normal LV size and function with abnormal septal motion from pacing suggesting that LV dilatation is not cause of AR   Charlton Haws     ______________________   Echo July 2022:  1. Left ventricular ejection fraction, by estimation, is 55 to 60%. The  left ventricle has normal function. The left ventricle has no regional  wall motion abnormalities. Left ventricular diastolic parameters are  consistent with Grade I diastolic  dysfunction (impaired relaxation). Elevated left atrial pressure.   2. Right ventricular systolic function is normal. The right ventricular  size is normal. There is mildly elevated pulmonary artery systolic  pressure. The estimated right ventricular systolic pressure is 41.6 mmHg.   3. The mitral valve is degenerative. Mild mitral valve regurgitation.  Moderate mitral annular calcification.   4. The aortic valve has been repaired/replaced. Aortic valve  regurgitation is mild. There is a 23 mm Sapien prosthetic (TAVR) valve  present in the aortic position. Aortic valve mean gradient measures 4.5  mmHg. Aortic valve Vmax measures 1.47 m/s.   5. The inferior vena cava is dilated in size with >50% respiratory  variability, suggesting right atrial pressure of 8 mmHg      Physical Exam:   VS:  BP (!) 122/50   Pulse 72   Ht 5' 3.5" (1.613 m)   Wt 102 lb 12.8 oz (46.6 kg)   SpO2 95%   BMI 17.92 kg/m    Wt Readings from Last 3 Encounters:  01/01/23 102 lb 12.8 oz (46.6 kg)  12/31/22 102 lb 9.6 oz (46.5 kg)  10/31/22 103 lb (46.7 kg)    GEN: Well nourished, well developed in no acute distress NECK: No JVD;  +L>R carotid  bruits CARDIAC: RRR, no murmurs, rubs, gallops RESPIRATORY:  Clear to auscultation without rales, wheezing or rhonchi  ABDOMEN: Soft, non-tender, non-distended EXTREMITIES:  No edema; No deformity   ASSESSMENT AND PLAN: .   S/p tavr -plan for updated echo -Continue current medications including amlodipine 10 mg daily, aspirin 81 mg daily, Lasix 20 mg daily, fish oil 1000 mg daily, simvastatin 20 mg daily -SOB but likely multifactorial   HTN -on repeat 122/50 -no med changes today -discussed proper hydration and watching salt intake  Ppm -she says she sees Dr. Graciela Husbands next month -no issues today  Chronic obstructive pulm disease -some SOB but some of this is due to recent covid infection -she sees Dr. Delton Coombes  Chronic diastolic CHF   -euvolemic on exam -plan to continue current medications -will update echocardiogram    Dispo: Please follow-up in a year  Signed, Sharlene Dory, PA-C

## 2023-01-01 NOTE — Patient Instructions (Addendum)
Medication Instructions:   Your physician recommends that you continue on your current medications as directed. Please refer to the Current Medication list given to you today.   *If you need a refill on your cardiac medications before your next appointment, please call your pharmacy*   Lab Work:  NONE ORDERED  TODAY    If you have labs (blood work) drawn today and your tests are completely normal, you will receive your results only by: MyChart Message (if you have MyChart) OR A paper copy in the mail If you have any lab test that is abnormal or we need to change your treatment, we will call you to review the results.   Testing/Procedures: BOTH PROCEDURES ON SAME IF POSSIBLE IN AFTERNOON  Your physician has requested that you have a carotid duplex. This test is an ultrasound of the carotid arteries in your neck. It looks at blood flow through these arteries that supply the brain with blood. Allow one hour for this exam. There are no restrictions or special instructions.  Your physician has requested that you have an echocardiogram. Echocardiography is a painless test that uses sound waves to create images of your heart. It provides your doctor with information about the size and shape of your heart and how well your heart's chambers and valves are working. This procedure takes approximately one hour. There are no restrictions for this procedure. Please do NOT wear cologne, perfume, aftershave, or lotions (deodorant is allowed). Please arrive 15 minutes prior to your appointment time.   Follow-Up: At [facility], you and your health needs are our priority.  As part of our continuing mission to provide you with exceptional heart care, we have created designated Provider Care Teams.  These Care Teams include your primary Cardiologist (physician) and Advanced Practice Providers (APPs -  Physician Assistants and Nurse Practitioners) who all work together to provide you with the care  you need, when you need it.  We recommend signing up for the patient portal called "MyChart".  Sign up information is provided on this After Visit Summary.  MyChart is used to connect with patients for Virtual Visits (Telemedicine).  Patients are able to view lab/test results, encounter notes, upcoming appointments, etc.  Non-urgent messages can be sent to your provider as well.   To learn more about what you can do with MyChart, go to [email or web address].    Your next appointment:   1 year(s)  Provider:   Verne Carrow, MD     Other Instructions  Low-Sodium Eating Plan Salt (sodium) helps you keep a healthy balance of fluids in your body. Too much sodium can raise your blood pressure. It can also cause fluid and waste to be held in your body. Your health care provider or dietitian may recommend a low-sodium eating plan if you have high blood pressure (hypertension), kidney disease, liver disease, or heart failure. Eating less sodium can help lower your blood pressure and reduce swelling. It can also protect your heart, liver, and kidneys. What are tips for following this plan? Reading food labels  Check food labels for the amount of sodium per serving. If you eat more than one serving, you must multiply the listed amount by the number of servings. Choose foods with less than 140 milligrams (mg) of sodium per serving. Avoid foods with 300 mg of sodium or more per serving. Always check how much sodium is in a product, even if the label says "unsalted" or "no salt added." Shopping  Buy products  labeled as "low-sodium" or "no salt added." Buy fresh foods. Avoid canned foods and pre-made or frozen meals. Avoid canned, cured, or processed meats. Buy breads that have less than 80 mg of sodium per slice. Cooking  Eat more home-cooked food. Try to eat less restaurant, buffet, and fast food. Try not to add salt when you cook. Use salt-free seasonings or herbs instead of table salt  or sea salt. Check with your provider or pharmacist before using salt substitutes. Cook with plant-based oils, such as canola, sunflower, or olive oil. Meal planning When eating at a restaurant, ask if your food can be made with less salt or no salt. Avoid dishes labeled as brined, pickled, cured, or smoked. Avoid dishes made with soy sauce, miso, or teriyaki sauce. Avoid foods that have monosodium glutamate (MSG) in them. MSG may be added to some restaurant food, sauces, soups, bouillon, and canned foods. Make meals that can be grilled, baked, poached, roasted, or steamed. These are often made with less sodium. General information Try to limit your sodium intake to 1,500-2,300 mg each day, or the amount told by your provider. What foods should I eat? Fruits Fresh, frozen, or canned fruit. Fruit juice. Vegetables Fresh or frozen vegetables. "No salt added" canned vegetables. "No salt added" tomato sauce and paste. Low-sodium or reduced-sodium tomato and vegetable juice. Grains Low-sodium cereals, such as oats, puffed wheat and rice, and shredded wheat. Low-sodium crackers. Unsalted rice. Unsalted pasta. Low-sodium bread. Whole grain breads and whole grain pasta. Meats and other proteins Fresh or frozen meat, poultry, seafood, and fish. These should have no added salt. Low-sodium canned tuna and salmon. Unsalted nuts. Dried peas, beans, and lentils without added salt. Unsalted canned beans. Eggs. Unsalted nut butters. Dairy Milk. Soy milk. Cheese that is naturally low in sodium, such as ricotta cheese, fresh mozzarella, or Swiss cheese. Low-sodium or reduced-sodium cheese. Cream cheese. Yogurt. Seasonings and condiments Fresh and dried herbs and spices. Salt-free seasonings. Low-sodium mustard and ketchup. Sodium-free salad dressing. Sodium-free light mayonnaise. Fresh or refrigerated horseradish. Lemon juice. Vinegar. Other foods Homemade, reduced-sodium, or low-sodium soups. Unsalted popcorn  and pretzels. Low-salt or salt-free chips. The items listed above may not be all the foods and drinks you can have. Talk to a dietitian to learn more. What foods should I avoid? Vegetables Sauerkraut, pickled vegetables, and relishes. Olives. [place] fries. Onion rings. Regular canned vegetables, except low-sodium or reduced-sodium items. Regular canned tomato sauce and paste. Regular tomato and vegetable juice. Frozen vegetables in sauces. Grains Instant hot cereals. Bread stuffing, pancake, and biscuit mixes. Croutons. Seasoned rice or pasta mixes. Noodle soup cups. Boxed or frozen macaroni and cheese. Regular salted crackers. Self-rising flour. Meats and other proteins Meat or fish that is salted, canned, smoked, spiced, or pickled. Precooked or cured meat, such as sausages or meat loaves. Tomasa Blase. Ham. Pepperoni. Hot dogs. Corned beef. Chipped beef. Salt pork. Jerky. Pickled herring, anchovies, and sardines. Regular canned tuna. Salted nuts. Dairy Processed cheese and cheese spreads. Hard cheeses. Cheese curds. Blue cheese. Feta cheese. String cheese. Regular cottage cheese. Buttermilk. Canned milk. Fats and oils Salted butter. Regular margarine. Ghee. Bacon fat. Seasonings and condiments Onion salt, garlic salt, seasoned salt, table salt, and sea salt. Canned and packaged gravies. Worcestershire sauce. Tartar sauce. Barbecue sauce. Teriyaki sauce. Soy sauce, including reduced-sodium soy sauce. Steak sauce. Fish sauce. Oyster sauce. Cocktail sauce. Horseradish that you find on the shelf. Regular ketchup and mustard. Meat flavorings and tenderizers. Bouillon cubes. Hot sauce. Pre-made or  packaged marinades. Pre-made or packaged taco seasonings. Relishes. Regular salad dressings. Salsa. Other foods Salted popcorn and pretzels. Corn chips and puffs. Potato and tortilla chips. Canned or dried soups. Pizza. Frozen entrees and pot pies. The items listed above may not be all the foods and drinks you  should avoid. Talk to a dietitian to learn more. This information is not intended to replace advice given to you by your health care provider. Make sure you discuss any questions you have with your health care provider. Document Revised: 04/03/2022 Document Reviewed: 04/03/2022 Elsevier Patient Education  2024 Else[employer] Inc.   Heart-Healthy Eating Plan Eating a healthy diet is important for the health of your heart. A heart-healthy eating plan includes: Eating less unhealthy fats. Eating more healthy fats. Eating less salt in your food. Salt is also called sodium. Making other changes in your diet. Talk with your doctor or a diet specialist (dietitian) to create an eating plan that is right for you. What is my plan? Your doctor may recommend an eating plan that includes: Total fat: ______% or less of total calories a day. Saturated fat: ______% or less of total calories a day. Cholesterol: less than _________mg a day. Sodium: less than _________mg a day. What are tips for following this plan? Cooking Avoid frying your food. Try to bake, boil, grill, or broil it instead. You can also reduce fat by: Removing the skin from poultry. Removing all visible fats from meats. Steaming vegetables in water or broth. Meal planning  At meals, divide your plate into four equal parts: Fill one-half of your plate with vegetables and green salads. Fill one-fourth of your plate with whole grains. Fill one-fourth of your plate with lean protein foods. Eat 2-4 cups of vegetables per day. One cup of vegetables is: 1 cup (91 g) broccoli or cauliflower florets. 2 medium carrots. 1 large bell pepper. 1 large sweet potato. 1 large tomato. 1 medium white potato. 2 cups (150 g) raw leafy greens. Eat 1-2 cups of fruit per day. One cup of fruit is: 1 small apple 1 large banana 1 cup (237 g) mixed fruit, 1 large orange,  cup (82 g) dried fruit, 1 cup (240 mL) 100% fruit juice. Eat more foods that  have soluble fiber. These are apples, broccoli, carrots, beans, peas, and barley. Try to get 20-30 g of fiber per day. Eat 4-5 servings of nuts, legumes, and seeds per week: 1 serving of dried beans or legumes equals  cup (90 g) cooked. 1 serving of nuts is  oz (12 almonds, 24 pistachios, or 7 walnut halves). 1 serving of seeds equals  oz (8 g). General information Eat more home-cooked food. Eat less restaurant, buffet, and fast food. Limit or avoid alcohol. Limit foods that are high in starch and sugar. Avoid fried foods. Lose weight if you are overweight. Keep track of how much salt (sodium) you eat. This is important if you have high blood pressure. Ask your doctor to tell you more about this. Try to add vegetarian meals each week. Fats Choose healthy fats. These include olive oil and canola oil, flaxseeds, walnuts, almonds, and seeds. Eat more omega-3 fats. These include salmon, mackerel, sardines, tuna, flaxseed oil, and ground flaxseeds. Try to eat fish at least 2 times each week. Check food labels. Avoid foods with trans fats or high amounts of saturated fat. Limit saturated fats. These are often found in animal products, such as meats, butter, and cream. These are also found in plant foods,  such as palm oil, palm kernel oil, and coconut oil. Avoid foods with partially hydrogenated oils in them. These have trans fats. Examples are stick margarine, some tub margarines, cookies, crackers, and other baked goods. What foods should I eat? Fruits All fresh, canned (in natural juice), or frozen fruits. Vegetables Fresh or frozen vegetables (raw, steamed, roasted, or grilled). Green salads. Grains Most grains. Choose whole wheat and whole grains most of the time. Rice and pasta, including brown rice and pastas made with whole wheat. Meats and other proteins Lean, well-trimmed beef, veal, pork, and lamb. Chicken and [place] without skin. All fish and shellfish. Wild duck, rabbit,  pheasant, and venison. Egg whites or low-cholesterol egg substitutes. Dried beans, peas, lentils, and tofu. Seeds and most nuts. Dairy Low-fat or nonfat cheeses, including ricotta and mozzarella. Skim or 1% milk that is liquid, powdered, or evaporated. Buttermilk that is made with low-fat milk. Nonfat or low-fat yogurt. Fats and oils Non-hydrogenated (trans-free) margarines. Vegetable oils, including soybean, sesame, sunflower, olive, peanut, safflower, corn, canola, and cottonseed. Salad dressings or mayonnaise made with a vegetable oil. Beverages Mineral water. Coffee and tea. Diet carbonated beverages. Sweets and desserts Sherbet, gelatin, and fruit ice. Small amounts of dark chocolate. Limit all sweets and desserts. Seasonings and condiments All seasonings and condiments. The items listed above may not be a complete list of foods and drinks you can eat. Contact a dietitian for more options. What foods should I avoid? Fruits Canned fruit in heavy syrup. Fruit in cream or butter sauce. Fried fruit. Limit coconut. Vegetables Vegetables cooked in cheese, cream, or butter sauce. Fried vegetables. Grains Breads that are made with saturated or trans fats, oils, or whole milk. Croissants. Sweet rolls. Donuts. High-fat crackers, such as cheese crackers. Meats and other proteins Fatty meats, such as hot dogs, ribs, sausage, bacon, rib-eye roast or steak. High-fat deli meats, such as salami and bologna. Caviar. Domestic duck and goose. Organ meats, such as liver. Dairy Cream, sour cream, cream cheese, and creamed cottage cheese. Whole-milk cheeses. Whole or 2% milk that is liquid, evaporated, or condensed. Whole buttermilk. Cream sauce or high-fat cheese sauce. Yogurt that is made from whole milk. Fats and oils Meat fat, or shortening. Cocoa butter, hydrogenated oils, palm oil, coconut oil, palm kernel oil. Solid fats and shortenings, including bacon fat, salt pork, lard, and butter. Nondairy cream  substitutes. Salad dressings with cheese or sour cream. Beverages Regular sodas and juice drinks with added sugar. Sweets and desserts Frosting. Pudding. Cookies. Cakes. Pies. Milk chocolate or white chocolate. Buttered syrups. Full-fat ice cream or ice cream drinks. The items listed above may not be a complete list of foods and drinks to avoid. Contact a dietitian for more information. Summary Heart-healthy meal planning includes eating less unhealthy fats, eating more healthy fats, and making other changes in your diet. Eat a balanced diet. This includes fruits and vegetables, low-fat or nonfat dairy, lean protein, nuts and legumes, whole grains, and heart-healthy oils and fats. This information is not intended to replace advice given to you by your health care provider. Make sure you discuss any questions you have with your health care provider. Document Revised: 04/22/2021 Document Reviewed: 04/22/2021 Elsevier Patient Education  2024 [employer].

## 2023-01-05 NOTE — Telephone Encounter (Signed)
Spoke with patient regarding prior message.Advised patient her script was nor sent to her pharmacy . Patient stated she feels better and dont think she needs her script. Made a 3 month f/u with Beth per Dr.Byrum . Patient stated she will call back if she needs her script sent into her pharmacy .  Patient's voice was understanding.Nothing else further needed.

## 2023-01-18 ENCOUNTER — Other Ambulatory Visit: Payer: Self-pay | Admitting: Nurse Practitioner

## 2023-01-18 DIAGNOSIS — F411 Generalized anxiety disorder: Secondary | ICD-10-CM

## 2023-01-19 ENCOUNTER — Other Ambulatory Visit: Payer: Self-pay

## 2023-01-19 ENCOUNTER — Other Ambulatory Visit: Payer: Self-pay | Admitting: Nurse Practitioner

## 2023-01-19 DIAGNOSIS — F411 Generalized anxiety disorder: Secondary | ICD-10-CM

## 2023-01-20 ENCOUNTER — Ambulatory Visit (INDEPENDENT_AMBULATORY_CARE_PROVIDER_SITE_OTHER): Payer: Medicare HMO

## 2023-01-20 DIAGNOSIS — I442 Atrioventricular block, complete: Secondary | ICD-10-CM | POA: Diagnosis not present

## 2023-01-21 ENCOUNTER — Other Ambulatory Visit: Payer: Self-pay | Admitting: Family Medicine

## 2023-01-22 ENCOUNTER — Encounter: Payer: Self-pay | Admitting: Family Medicine

## 2023-01-22 ENCOUNTER — Ambulatory Visit (INDEPENDENT_AMBULATORY_CARE_PROVIDER_SITE_OTHER): Payer: Medicare HMO | Admitting: Family Medicine

## 2023-01-22 ENCOUNTER — Other Ambulatory Visit: Payer: Self-pay | Admitting: Nurse Practitioner

## 2023-01-22 VITALS — BP 132/57 | HR 72 | Ht 63.5 in | Wt 103.1 lb

## 2023-01-22 DIAGNOSIS — F411 Generalized anxiety disorder: Secondary | ICD-10-CM

## 2023-01-22 DIAGNOSIS — M533 Sacrococcygeal disorders, not elsewhere classified: Secondary | ICD-10-CM | POA: Insufficient documentation

## 2023-01-22 DIAGNOSIS — R159 Full incontinence of feces: Secondary | ICD-10-CM | POA: Insufficient documentation

## 2023-01-22 LAB — CUP PACEART REMOTE DEVICE CHECK
Battery Remaining Longevity: 107 mo
Battery Voltage: 2.99 V
Brady Statistic AP VP Percent: 2.03 %
Brady Statistic AP VS Percent: 0 %
Brady Statistic AS VP Percent: 97.47 %
Brady Statistic AS VS Percent: 0.5 %
Brady Statistic RA Percent Paced: 2.22 %
Brady Statistic RV Percent Paced: 99.5 %
Date Time Interrogation Session: 20241022004618
Implantable Lead Connection Status: 753985
Implantable Lead Connection Status: 753985
Implantable Lead Implant Date: 20200722
Implantable Lead Implant Date: 20200722
Implantable Lead Location: 753859
Implantable Lead Location: 753860
Implantable Lead Model: 1944
Implantable Lead Model: 1948
Implantable Pulse Generator Implant Date: 20200722
Lead Channel Impedance Value: 380 Ohm
Lead Channel Impedance Value: 437 Ohm
Lead Channel Impedance Value: 532 Ohm
Lead Channel Impedance Value: 646 Ohm
Lead Channel Pacing Threshold Amplitude: 0.5 V
Lead Channel Pacing Threshold Amplitude: 0.625 V
Lead Channel Pacing Threshold Pulse Width: 0.4 ms
Lead Channel Pacing Threshold Pulse Width: 0.4 ms
Lead Channel Sensing Intrinsic Amplitude: 3.625 mV
Lead Channel Sensing Intrinsic Amplitude: 3.625 mV
Lead Channel Sensing Intrinsic Amplitude: 5 mV
Lead Channel Sensing Intrinsic Amplitude: 9.75 mV
Lead Channel Setting Pacing Amplitude: 1.5 V
Lead Channel Setting Pacing Amplitude: 2 V
Lead Channel Setting Pacing Pulse Width: 0.4 ms
Lead Channel Setting Sensing Sensitivity: 2 mV
Zone Setting Status: 755011
Zone Setting Status: 755011

## 2023-01-22 MED ORDER — HYDROXYZINE PAMOATE 50 MG PO CAPS
50.0000 mg | ORAL_CAPSULE | Freq: Three times a day (TID) | ORAL | 1 refills | Status: DC | PRN
Start: 2023-01-22 — End: 2023-07-11

## 2023-01-22 NOTE — Assessment & Plan Note (Signed)
Takes hydroxyzine as needed.  Needs refill today.

## 2023-01-22 NOTE — Assessment & Plan Note (Signed)
Some mild tenderness over the sacroiliac bilaterally today.  Pain worse with prolonged ambulation.  Recommended patient use topical treatments as needed.  Patient states pain is tolerable and goes away quickly after she stops ambulating.

## 2023-01-22 NOTE — Progress Notes (Signed)
Annual physical  Subjective   Patient ID: [name], female    DOB: 02-May-1935  Age: 87 y.o. MRN: [id number]  Chief Complaint  Patient presents with   Annual Exam   HPI [name] is a 87 y.o. old female here  for annual exam.   Changes in his/her health in the last 12 months: no    Patient lives alone.  Her niece lives in [place] and the patient has to drive somewhere long distance her niece will drive her otherwise she tries herself and takes care of her own ADLs.  Gets meals from [employer].  She currently smokes but is down to 5 cigarettes a day.  She has been smoking for her entire adult life.  Does not use alcohol.   Lives alone.  Niece lives in [place].  Still does shopping and cooking.  Meals on wheels. Cutting down to 5 cigarettes a day.  Lifelong smoker.  No alcohol.     Patient sees pulmonology and cardiology, most recently earlier this month.  Patient complains of back pain that occurs when she walks for long periods of time.  It is in her low back bilaterally.  Patient also complains that when she walks for prolonged period of time she sometimes has bowel movements without realizing it.  She wears adult depends diapers.  This occurs few times a week.  No numbness in the groin or legs.  No weakness in the legs.  No incontinence of urine.  The stool is usually formed. This has been going on for a few months.  She recently had a endoscopy with Dr. Leone Payor for black stools.   The ASCVD Risk score (Arnett DK, et al., 2019) failed to calculate for the following reasons:   The 2019 ASCVD risk score is only valid for ages 35 to 81  Health Maintenance Due  Topic Date Due   COVID-19 Vaccine (6 - 2023-24 season) 11/30/2022      Objective:     BP (!) 132/57   Pulse 72   Ht 5' 3.5" (1.613 m)   Wt 103 lb 1.9 oz (46.8 kg)   SpO2 97%   BMI 17.98 kg/m    Physical Exam General: Alert, oriented HEENT: PERRLA, EOMI CV: Regular rate rhythm Pulmonary: Scar  bilaterally GI: Soft, nontender. Rectal: Anal sphincter tone present but somewhat weak. MSK: Tenderness to palpation of the psis bilaterally.  No midline tenderness. Skin: Purpura on the upper extremities bilaterally   No results found for any visits on 01/22/23.      Assessment & Plan:   Full incontinence of feces Assessment & Plan: Does not have been every time, only after she is in walking for long periods.  Has no sensation of needing to have a bowel movement when it occurs.  Physical exam showed presence of anal sphincter tone.  No numbness, weakness, urinary incontinence, or other red flag symptoms.  Back pain is more on the sacroiliac joint area.  Recent pelvic x-ray showed arthritic changes in the hips bilaterally but no other concerning findings.  Will refer patient to physical therapy for pelvic floor rehab.  Orders: -     Ambulatory referral to Physical Therapy  Generalized anxiety disorder Assessment & Plan: Takes hydroxyzine as needed.  Needs refill today.  Orders: -     hydrOXYzine Pamoate; Take 1 capsule (50 mg total) by mouth 3 (three) times daily as needed for anxiety.  Dispense: 180 capsule; Refill: 1  Sacroiliac joint dysfunction Assessment &  Plan: Some mild tenderness over the sacroiliac bilaterally today.  Pain worse with prolonged ambulation.  Recommended patient use topical treatments as needed.  Patient states pain is tolerable and goes away quickly after she stops ambulating.      Return in about 6 months (around 07/23/2023) for HTN, incontinence.    Sandre Kitty, MD

## 2023-01-22 NOTE — Patient Instructions (Addendum)
It was nice to see you today,  We addressed the following topics today: -I do not think we need to do any imaging at this time.  The back pain is due to sacroiliitis.  You can try over-the-counter topical pain relieving creams such as Voltaren gel when it hurts. - Your fecal incontinence I think is due to decreased anal sphincter tone and I think you would benefit from pelvic floor physical therapy rehabilitation.  I will send in a referral for this. - Please follow-up with me in 6 months.  Have a great day,  Frederic Jericho, MD

## 2023-01-22 NOTE — Assessment & Plan Note (Signed)
Does not have been every time, only after she is in walking for long periods.  Has no sensation of needing to have a bowel movement when it occurs.  Physical exam showed presence of anal sphincter tone.  No numbness, weakness, urinary incontinence, or other red flag symptoms.  Back pain is more on the sacroiliac joint area.  Recent pelvic x-ray showed arthritic changes in the hips bilaterally but no other concerning findings.  Will refer patient to physical therapy for pelvic floor rehab.

## 2023-01-29 ENCOUNTER — Ambulatory Visit (HOSPITAL_COMMUNITY)
Admission: RE | Admit: 2023-01-29 | Discharge: 2023-01-29 | Disposition: A | Discharge status: Home / Self Care | Payer: Medicare HMO | Source: Ambulatory Visit | Attending: Physician Assistant | Admitting: Physician Assistant

## 2023-01-29 ENCOUNTER — Other Ambulatory Visit: Payer: Self-pay | Admitting: Family Medicine

## 2023-01-29 ENCOUNTER — Ambulatory Visit (HOSPITAL_BASED_OUTPATIENT_CLINIC_OR_DEPARTMENT_OTHER): Payer: Medicare HMO

## 2023-01-29 DIAGNOSIS — Z952 Presence of prosthetic heart valve: Secondary | ICD-10-CM

## 2023-01-29 DIAGNOSIS — E782 Mixed hyperlipidemia: Secondary | ICD-10-CM

## 2023-01-29 DIAGNOSIS — I6523 Occlusion and stenosis of bilateral carotid arteries: Secondary | ICD-10-CM | POA: Diagnosis present

## 2023-01-29 LAB — ECHOCARDIOGRAM COMPLETE
AV Mean grad: 7.6 mm[Hg]
AV Peak grad: 13.9 mm[Hg]
Ao pk vel: 1.86 m/s
Area-P 1/2: 3.19 cm2
S' Lateral: 2.7 cm

## 2023-01-31 ENCOUNTER — Other Ambulatory Visit: Payer: Self-pay | Admitting: Cardiovascular Disease

## 2023-01-31 ENCOUNTER — Other Ambulatory Visit: Payer: Self-pay | Admitting: Family Medicine

## 2023-01-31 DIAGNOSIS — E782 Mixed hyperlipidemia: Secondary | ICD-10-CM

## 2023-02-09 NOTE — Progress Notes (Signed)
Remote pacemaker transmission.   

## 2023-02-13 ENCOUNTER — Other Ambulatory Visit: Payer: Self-pay | Admitting: Nurse Practitioner

## 2023-02-13 ENCOUNTER — Other Ambulatory Visit: Payer: Self-pay | Admitting: Cardiovascular Disease

## 2023-02-13 DIAGNOSIS — F411 Generalized anxiety disorder: Secondary | ICD-10-CM

## 2023-02-19 ENCOUNTER — Encounter: Payer: Medicare HMO | Admitting: Internal Medicine

## 2023-02-20 ENCOUNTER — Other Ambulatory Visit: Payer: Self-pay | Admitting: Family Medicine

## 2023-02-20 DIAGNOSIS — J449 Chronic obstructive pulmonary disease, unspecified: Secondary | ICD-10-CM

## 2023-03-09 ENCOUNTER — Telehealth: Payer: Self-pay | Admitting: Emergency Medicine

## 2023-03-09 MED ORDER — TRELEGY ELLIPTA 100-62.5-25 MCG/ACT IN AEPB: 1.0000 | INHALATION_SPRAY | Freq: Every day | RESPIRATORY_TRACT | Status: DC

## 2023-03-09 NOTE — Telephone Encounter (Signed)
Called patient.  Patient is taking Trelegy 100 mcg.  Instructed patient to rinse mouth with water after using.  Will leave 2 sample boxes at front desk for patient to pick up.  Patient verbalized understanding.

## 2023-03-09 NOTE — Addendum Note (Signed)
Addended byClyda Greener M on: 03/09/2023 02:46 PM   Modules accepted: Orders

## 2023-03-09 NOTE — Telephone Encounter (Signed)
Called patient.  Patient states Dr. Delton Coombes gave her samples of Trelegy at last OV.  She likes this inhaler better than Anoro and would like more samples until her OV 04/07/2023 with Beth.  She is not sure her insurance will pay for Trelegy.  I cannot find in patients last OV or chart where Dr. Delton Coombes has rx Trelegy.  Please advise if we can give samples of Trelegy to patient and what dose/sig.  Thank you.

## 2023-03-09 NOTE — Telephone Encounter (Signed)
PT calling about her inhalers. States she either needs more samples of Trelegy what the Dr. gave her or her old medication(Anoro) called in.  Pharm is [facility] at [facility]  [phone]  She is running out.

## 2023-03-09 NOTE — Telephone Encounter (Signed)
Yes, that's fine. Make sure she knows to clean her mouth out after using Trelegy as the ICS can cause thrush. Thanks.

## 2023-03-17 ENCOUNTER — Encounter: Payer: Self-pay | Admitting: Internal Medicine

## 2023-03-17 ENCOUNTER — Ambulatory Visit: Payer: Medicare HMO | Attending: Internal Medicine | Admitting: Internal Medicine

## 2023-03-17 VITALS — BP 138/60 | HR 74 | Ht 63.5 in | Wt 104.2 lb

## 2023-03-17 DIAGNOSIS — I4729 Other ventricular tachycardia: Secondary | ICD-10-CM | POA: Diagnosis not present

## 2023-03-17 DIAGNOSIS — I5032 Chronic diastolic (congestive) heart failure: Secondary | ICD-10-CM

## 2023-03-17 DIAGNOSIS — I442 Atrioventricular block, complete: Secondary | ICD-10-CM

## 2023-03-17 DIAGNOSIS — Z950 Presence of cardiac pacemaker: Secondary | ICD-10-CM | POA: Diagnosis not present

## 2023-03-17 LAB — CUP PACEART INCLINIC DEVICE CHECK
Battery Remaining Longevity: 106 mo
Battery Voltage: 2.99 V
Brady Statistic AP VP Percent: 1.59 %
Brady Statistic AP VS Percent: 0 %
Brady Statistic AS VP Percent: 98.1 %
Brady Statistic AS VS Percent: 0.31 %
Brady Statistic RA Percent Paced: 1.68 %
Brady Statistic RV Percent Paced: 99.69 %
Date Time Interrogation Session: 20241217164210
Implantable Lead Connection Status: 753985
Implantable Lead Connection Status: 753985
Implantable Lead Implant Date: 20200722
Implantable Lead Implant Date: 20200722
Implantable Lead Location: 753859
Implantable Lead Location: 753860
Implantable Lead Model: 1944
Implantable Lead Model: 1948
Implantable Pulse Generator Implant Date: 20200722
Lead Channel Impedance Value: 380 Ohm
Lead Channel Impedance Value: 437 Ohm
Lead Channel Impedance Value: 589 Ohm
Lead Channel Impedance Value: 703 Ohm
Lead Channel Pacing Threshold Amplitude: 0.5 V
Lead Channel Pacing Threshold Amplitude: 0.5 V
Lead Channel Pacing Threshold Amplitude: 0.625 V
Lead Channel Pacing Threshold Pulse Width: 0.4 ms
Lead Channel Pacing Threshold Pulse Width: 0.4 ms
Lead Channel Pacing Threshold Pulse Width: 0.4 ms
Lead Channel Sensing Intrinsic Amplitude: 1.9 mV
Lead Channel Sensing Intrinsic Amplitude: 2.125 mV
Lead Channel Sensing Intrinsic Amplitude: 3.75 mV
Lead Channel Sensing Intrinsic Amplitude: 5 mV
Lead Channel Sensing Intrinsic Amplitude: 9.25 mV
Lead Channel Sensing Intrinsic Amplitude: 9.3 mV
Lead Channel Setting Pacing Amplitude: 1.5 V
Lead Channel Setting Pacing Amplitude: 2 V
Lead Channel Setting Pacing Pulse Width: 0.4 ms
Lead Channel Setting Sensing Sensitivity: 2 mV
Zone Setting Status: 755011
Zone Setting Status: 755011

## 2023-03-17 NOTE — Patient Instructions (Signed)

## 2023-03-17 NOTE — Progress Notes (Signed)
[facility] patient ID: [name], female   DOB: 24-Sep-1935, 87 y.o.   MRN: [id number]      Patient Care Team: Sandre Kitty, MD as PCP - General (Family Medicine) Duke Salvia, MD as PCP - Electrophysiology (Cardiology) Kathleene Hazel, MD as PCP - Cardiology (Cardiology)   HPI  [name] is a 87 y.o. female Seen in followup for Medtronic  pacemaker implanted 7/20 for syncope and high grade heart block  Severe AS and s/p TAVR 9/20   Much improved following pacing and TAVR    The patient denies chest pain, nocturnal dyspnea, orthopnea or peripheral edema.  There have been no palpitations or syncope.  Complains of chronic shortness of breath with a.m. cough as well as lightheadedness upon standing.Marland Kitchen   DATE TEST EF   5/22 CTA    % No obstructive CAD  8/23 Echo  55% RV dysfunction mild TAVR  10/24 Echo  60-65%     Date Cr K Hgb  6/23 0.83 4.1 15.4   6/24 0.81 4.0 13.4    She is here to be Past Medical History:  Diagnosis Date   Anxiety    Stable   ASCUS on Pap smear 11/15/2009   Carotid artery disease (HCC)    1-39% RICA stenosis and 60-79% LICA stenosis on pre TAVR dopplers    Cataract    COPD (chronic obstructive pulmonary disease) (HCC)    Depression    Duodenal ulcer due to Helicobacter pylori 06/28/2020   Endometrial polyp 11/15/2009   Endometrioid adenocarcinoma    H/O varicella    Heart murmur    Hemorrhoids    History of measles, mumps, or rubella    Hypercholesterolemia    Hypertension    Menopausal symptoms 06/13/2010   Ovarian cyst 11/15/2009   PMB (postmenopausal bleeding) 11/15/2009   Presence of permanent cardiac pacemaker 10/20/2018   Vaginal bleeding     Past Surgical History:  Procedure Laterality Date   BIOPSY  06/19/2020   Procedure: BIOPSY;  Surgeon: Lemar Lofty., MD;  Location: [facility] ENDOSCOPY;  Service: Gastroenterology;;   BREAST SURGERY     Over twenty - five  years ago   CATARACT EXTRACTION W/ INTRAOCULAR  LENS IMPLANT Bilateral    CERVICAL BIOPSY     ESOPHAGOGASTRODUODENOSCOPY (EGD) WITH PROPOFOL N/A 06/19/2020   Procedure: ESOPHAGOGASTRODUODENOSCOPY (EGD) WITH PROPOFOL;  Surgeon: Lemar Lofty., MD;  Location: [facility] ENDOSCOPY;  Service: Gastroenterology;  Laterality: N/A;   EYE SURGERY     INSERT / REPLACE / REMOVE PACEMAKER  10/20/2018   LAPAROSCOPIC ASSISTED VAGINAL HYSTERECTOMY  12/2009   BSO   MULTIPLE EXTRACTIONS WITH ALVEOLOPLASTY N/A 12/09/2018   Procedure: EXTRACTION OF TOOTH #'S 6,21-29 AND 32 WITH ALVEOLOPLASTY AND BILATERAL MANDIBULAR TORI REDUCTIONS;  Surgeon: Charlynne Pander, DDS;  Location: [facility] OR;  Service: Oral Surgery;  Laterality: N/A;   PACEMAKER IMPLANT N/A 10/20/2018   Procedure: PACEMAKER IMPLANT;  Surgeon: Duke Salvia, MD;  Location: [facility] INVASIVE CV LAB;  Service: Cardiovascular;  Laterality: N/A;   RIGHT/LEFT HEART CATH AND CORONARY ANGIOGRAPHY N/A 11/24/2018   Procedure: RIGHT/LEFT HEART CATH AND CORONARY ANGIOGRAPHY;  Surgeon: Kathleene Hazel, MD;  Location: [facility] INVASIVE CV LAB;  Service: Cardiovascular;  Laterality: N/A;   TEE WITHOUT CARDIOVERSION N/A 12/21/2018   Procedure: TRANSESOPHAGEAL ECHOCARDIOGRAM (TEE);  Surgeon: Kathleene Hazel, MD;  Location[facility] OR;  Service: Open Heart Surgery;  Laterality: N/A;    Current Meds  Medication Sig   acetaminophen (  TYLENOL) 325 MG tablet Take 2 tablets (650 mg total) by mouth every 6 (six) hours as needed for moderate pain.   albuterol (VENTOLIN HFA) 108 (90 Base) MCG/ACT inhaler INHALE 2 PUFFS BY MOUTH EVERY 4 HOURS AS NEEDED FOR WHEEZING FOR SHORTNESS OF BREATH   amLODipine (NORVASC) 10 MG tablet TAKE 1 TABLET BY MOUTH ONCE DAILY . APPOINTMENT REQUIRED FOR FUTURE REFILLS WITH  CARDIOLOGIST  IN  OCTOBER  2024,  FINAL  ATTEMPT   aspirin 81 MG chewable tablet Chew 1 tablet (81 mg total) by mouth daily.   carboxymethylcellulose (REFRESH PLUS) 0.5 % SOLN Place 1 drop into both eyes daily as needed (dry  eyes).   Cholecalciferol (VITAMIN D-3) 125 MCG (5000 UT) TABS Take 5,000 Units by mouth daily.   citalopram (CELEXA) 40 MG tablet Take 1/2 (one-half) tablet by mouth once daily   Fluticasone-Umeclidin-Vilant (TRELEGY ELLIPTA) 100-62.5-25 MCG/ACT AEPB Inhale 1 puff into the lungs daily.   furosemide (LASIX) 20 MG tablet Take 1 tablet (20 mg total) by mouth daily.   halobetasol (ULTRAVATE) 0.05 % cream Apply topically 2 (two) times daily.   hydrOXYzine (VISTARIL) 50 MG capsule Take 1 capsule (50 mg total) by mouth 3 (three) times daily as needed for anxiety.   Omega-3 Fatty Acids (FISH OIL) 1000 MG CAPS Take 1,000 mg by mouth daily.   pantoprazole (PROTONIX) 20 MG tablet Take 1 tablet by mouth once daily   simvastatin (ZOCOR) 20 MG tablet Take 1 tablet by mouth once daily   vitamin B-12 (CYANOCOBALAMIN) 500 MCG tablet Take 500 mcg by mouth daily.   vitamin E 180 MG (400 UNITS) capsule Take 400 Units by mouth daily.  Medications were reviewed   No Known Allergies   Review of Systems negative except from HPI and PMH  Physical Exam: BP (!) 154/60   Pulse 74   Ht 5' 3.5" (1.613 m)   Wt 104 lb 3.2 oz (47.3 kg)   SpO2 96%   BMI 18.17 kg/m   Well developed and well nourished in no acute distress HENT normal Neck supple  Decrease in remote breath sounds Device pocket well healed; without hematoma or erythema.  There is no tethering  Regular rate and rhythm, no  gallop No  murmur Abd-soft with active BS No Clubbing cyanosis  edema Skin-warm and dry A & Oriented  Grossly normal sensory and motor function  ECG sinus at 71 with P synchronous pacing with a QR in lead V1 and upright QRS lead I  Device function is normal. Programming changes   See Paceart for details    Assessment and  Plan  Complete heart block  AS  S/p TAVR  Pacemaker Medtronic    HFpEF chronic  Ventricular tachycardia  Nonsustained  During TAVR procedure   COPD  Hypertension    Blood pressure is  reasonably controlled.  Continue amlodipine  Dyspnea is likely multifactorial with a strong pulmonary component but also possibly HFpEF in the context of longstanding hypertension and age.  We have reviewed the potential benefits of an SGLT2 and she will look into the cost.  Continue furosemide at 20

## 2023-03-23 ENCOUNTER — Other Ambulatory Visit: Payer: Self-pay | Admitting: Family Medicine

## 2023-03-23 DIAGNOSIS — J449 Chronic obstructive pulmonary disease, unspecified: Secondary | ICD-10-CM

## 2023-04-07 ENCOUNTER — Encounter: Payer: Self-pay | Admitting: Primary Care

## 2023-04-07 ENCOUNTER — Ambulatory Visit: Payer: PPO | Admitting: Primary Care

## 2023-04-07 VITALS — BP 138/60 | HR 74 | Temp 98.6°F | Ht 63.0 in | Wt 103.4 lb

## 2023-04-07 DIAGNOSIS — J449 Chronic obstructive pulmonary disease, unspecified: Secondary | ICD-10-CM | POA: Diagnosis not present

## 2023-04-07 MED ORDER — TRELEGY ELLIPTA 100-62.5-25 MCG/ACT IN AEPB: 1.0000 | INHALATION_SPRAY | Freq: Every day | RESPIRATORY_TRACT | 3 refills | Status: DC

## 2023-04-07 MED ORDER — TRELEGY ELLIPTA 100-62.5-25 MCG/ACT IN AEPB: 1.0000 | INHALATION_SPRAY | Freq: Every day | RESPIRATORY_TRACT | Status: DC

## 2023-04-07 NOTE — Patient Instructions (Addendum)
-  CHRONIC OBSTRUCTIVE PULMONARY DISEASE (COPD): COPD is a chronic lung condition that makes it hard to breathe. You are currently stable but experience shortness of breath with physical activity. Continue using Trelegy daily and Albuterol  as needed. We encourage you to keep working on quitting smoking. We will reassess your need for home oxygen with a walk test today.  -VACCINATION STATUS: You are up to date with your COVID-19 booster, flu shot, and RSV vaccine. No further action is needed at this time.  INSTRUCTIONS: Continue Trelegy one puff daily  Take robitussin as needed to loosen congestion/cough   Orders: Walk test for O2 qualification   Follow-up: 3 months with Dr. Byrum or sooner if your symptoms worsen or if your mucus becomes colored or more productive.

## 2023-04-07 NOTE — Progress Notes (Signed)
 @Patient  ID: [name], female    DOB: January 24, 1936, 88 y.o.   MRN: [id number]  Chief Complaint  Patient presents with   Follow-up    Doing well.  Sx improved with Trelegy    Referring provider: Hanford Powell BRAVO, NP  HPI: 88 year old female, current smoker. Patient of Dr. Shelah, last seen on 12/31/2022. PMH significant for hypertension, carotid artery disease, complete heart block, severe aortic valve stenosis-post TAVR, COPD, endometrial cancer, hyperlipidemia.  04/07/2023 Discussed the use of AI scribe software for clinical note transcription with the patient, who gave verbal consent to proceed.  History of Present Illness   The patient presents today for 3 month follow-up/ severe COPD, recent history of COVID-19 infection in the fall. She has been working on reducing her smoking habit, currently down to about five cigarettes per day. The patient's maintenance inhaler is Trelegy, which she uses daily. She reports that her breathing is generally fine unless she engages in physical activities, which can cause her to become short of breath and require rest. She does not use oxygen at home and has not been deemed to require it based on previous assessments.  The patient uses Albuterol , typically once a day, sometimes twice, primarily at night. She also reports a chronic cough with phlegm production, which she describes as typical for her. She does not use any expectorants.  The patient had a recent change in insurance, which has affected her medication costs. She has been using samples of Trelegy but also has a supply of Anoro. She reports a slight preference for Trelegy over Anoro, but the cost difference is a concern.  The patient is up to date with her vaccines, including the COVID-19 booster, flu shot, and RSV vaccine. She has been advised to monitor for signs of a COPD flare-up, such as increased mucus production or increased use of her rescue inhaler.      No Known  Allergies  Immunization History  Administered Date(s) Administered   Influenza, High Dose Seasonal PF 01/05/2023   PFIZER(Purple Top)SARS-COV-2 Vaccination 05/03/2019, 05/24/2019, 01/27/2020   PNEUMOCOCCAL CONJUGATE-20 08/17/2020   Pfizer Covid-19 Vaccine Bivalent Booster 100yrs & up 12/25/2020, 01/22/2022   RSV,unspecified 01/08/2023   Tdap 01/23/2022   Zoster Recombinant(Shingrix) 01/04/2020, 03/05/2020    Past Medical History:  Diagnosis Date   Anxiety    Stable   ASCUS on Pap smear 11/15/2009   Carotid artery disease (HCC)    1-39% RICA stenosis and 60-79% LICA stenosis on pre TAVR dopplers    Cataract    COPD (chronic obstructive pulmonary disease) (HCC)    Depression    Duodenal ulcer due to Helicobacter pylori 06/28/2020   Endometrial polyp 11/15/2009   Endometrioid adenocarcinoma    H/O varicella    Heart murmur    Hemorrhoids    History of measles, mumps, or rubella    Hypercholesterolemia    Hypertension    Menopausal symptoms 06/13/2010   Ovarian cyst 11/15/2009   PMB (postmenopausal bleeding) 11/15/2009   Presence of permanent cardiac pacemaker 10/20/2018   Vaginal bleeding     Tobacco History: Social History   Tobacco Use  Smoking Status Every Day   Current packs/day: 0.25   Average packs/day: 0.3 packs/day for 55.0 years (13.8 ttl pk-yrs)   Types: Cigarettes  Smokeless Tobacco Never  Tobacco Comments   5-6 cigarettes smoke daily HEI 03/27/2022   Ready to quit: Not Answered Counseling given: Not Answered Tobacco comments: 5-6 cigarettes smoke daily HEI 03/27/2022  Outpatient Medications Prior to Visit  Medication Sig Dispense Refill   acetaminophen  (TYLENOL ) 325 MG tablet Take 2 tablets (650 mg total) by mouth every 6 (six) hours as needed for moderate pain. 30 tablet 0   albuterol  (VENTOLIN  HFA) 108 (90 Base) MCG/ACT inhaler INHALE 2 PUFFS BY MOUTH EVERY 4 HOURS AS NEEDED FOR WHEEZING FOR SHORTNESS OF BREATH 18 g 3   amLODipine  (NORVASC ) 10 MG  tablet TAKE 1 TABLET BY MOUTH ONCE DAILY . APPOINTMENT REQUIRED FOR FUTURE REFILLS WITH  CARDIOLOGIST  IN  OCTOBER  2024,  FINAL  ATTEMPT 90 tablet 3   aspirin  81 MG chewable tablet Chew 1 tablet (81 mg total) by mouth daily.     carboxymethylcellulose (REFRESH PLUS) 0.5 % SOLN Place 1 drop into both eyes daily as needed (dry eyes).     Cholecalciferol (VITAMIN D-3) 125 MCG (5000 UT) TABS Take 5,000 Units by mouth daily.     citalopram  (CELEXA ) 40 MG tablet Take 1/2 (one-half) tablet by mouth once daily 30 tablet 2   Fluticasone -Umeclidin-Vilant (TRELEGY ELLIPTA ) 100-62.5-25 MCG/ACT AEPB Inhale 1 puff into the lungs daily.     furosemide  (LASIX ) 20 MG tablet Take 1 tablet (20 mg total) by mouth daily. 90 tablet 3   halobetasol  (ULTRAVATE ) 0.05 % cream Apply topically 2 (two) times daily. 50 g 2   hydrOXYzine  (VISTARIL ) 50 MG capsule Take 1 capsule (50 mg total) by mouth 3 (three) times daily as needed for anxiety. 180 capsule 1   Omega-3 Fatty Acids  (FISH OIL) 1000 MG CAPS Take 1,000 mg by mouth daily.     pantoprazole  (PROTONIX ) 20 MG tablet Take 1 tablet by mouth once daily 90 tablet 1   simvastatin  (ZOCOR ) 20 MG tablet Take 1 tablet by mouth once daily 90 tablet 0   vitamin B-12 (CYANOCOBALAMIN ) 500 MCG tablet Take 500 mcg by mouth daily.     vitamin E  180 MG (400 UNITS) capsule Take 400 Units by mouth daily.     No facility-administered medications prior to visit.   Review of Systems  Review of Systems  Constitutional: Negative.   HENT: Negative.    Respiratory: Negative.    Cardiovascular: Negative.    Physical Exam  BP 138/60 (BP Location: Left Arm, Patient Position: Sitting, Cuff Size: Normal)   Pulse 74   Temp 98.6 F (37 C) (Oral)   Ht 5' 3 (1.6 m)   Wt 103 lb 6.4 oz (46.9 kg)   SpO2 93%   BMI 18.32 kg/m  Physical Exam Constitutional:      General: She is not in acute distress.    Appearance: Normal appearance. She is not ill-appearing.  HENT:     Head:  Normocephalic and atraumatic.     Mouth/Throat:     Mouth: Mucous membranes are moist.     Pharynx: Oropharynx is clear.  Cardiovascular:     Rate and Rhythm: Normal rate and regular rhythm.  Pulmonary:     Effort: Pulmonary effort is normal.     Breath sounds: Normal breath sounds. No wheezing or rhonchi.  Skin:    General: Skin is warm and dry.  Neurological:     General: No focal deficit present.     Mental Status: She is alert and oriented to person, place, and time. Mental status is at baseline.  Psychiatric:        Mood and Affect: Mood normal.        Behavior: Behavior normal.  Thought Content: Thought content normal.        Judgment: Judgment normal.      Lab Results:  CBC    Component Value Date/Time   WBC 4.7 09/30/2022 1004   RBC 4.44 09/30/2022 1004   HGB 13.4 09/30/2022 1004   HGB 14.0 09/16/2022 1001   HCT 40.9 09/30/2022 1004   HCT 42.4 09/16/2022 1001   PLT 206.0 09/30/2022 1004   PLT 145 (L) 09/16/2022 1001   MCV 92.0 09/30/2022 1004   MCV 91 09/16/2022 1001   MCH 29.9 09/16/2022 1001   MCH 30.1 07/06/2020 0308   MCHC 32.9 09/30/2022 1004   RDW 14.6 09/30/2022 1004   RDW 13.5 09/16/2022 1001   LYMPHSABS 1.3 09/30/2022 1004   LYMPHSABS 1.5 09/02/2021 0901   MONOABS 0.5 09/30/2022 1004   EOSABS 0.2 09/30/2022 1004   EOSABS 0.3 09/02/2021 0901   BASOSABS 0.0 09/30/2022 1004   BASOSABS 0.0 09/02/2021 0901    BMET    Component Value Date/Time   NA 140 09/16/2022 1001   K 4.0 09/16/2022 1001   CL 103 09/16/2022 1001   CO2 23 09/16/2022 1001   GLUCOSE 86 09/16/2022 1001   GLUCOSE 118 (H) 07/04/2020 1319   BUN 20 09/16/2022 1001   CREATININE 0.81 09/16/2022 1001   CALCIUM 9.3 09/16/2022 1001   GFRNONAA >60 07/04/2020 1319   GFRAA 88 01/11/2019 1014    BNP    Component Value Date/Time   BNP 142.7 (H) 12/17/2018 1428    ProBNP    Component Value Date/Time   PROBNP 1,574 (H) 08/01/2020 1442    Imaging: [facility]  DEVICE CHECK Result Date: 03/17/2023 Pacemaker check in clinic. Normal device function. Thresholds, sensing, impedance's consistent with previous measurements. Device programmed to maximize longevity. No mode switch or high ventricular rates noted. Device programmed at appropriate safety margins. Histogram distribution appropriate for patient activity level. Device programmed to optimize intrinsic conduction. Estimated longevity 8.7 years. Patient enrolled in remote follow-up.Bard Silvan, BSN, RN    Assessment & Plan:   1. COPD, severe (HCC) (Primary)     Chronic Obstructive Pulmonary Disease (COPD) Stable with exertional dyspnea. Chronic productive cough with clear mucus. No current signs of infection or exacerbation. She is compliant with Trelegy 100mcg daily, uses Albuterol  on average once daily at night. Patient continues to smoke but is working on reduction. -Continue Trelegy 100mcg daily (sample given and RX sent) -Use Albuterol  hfa 2 puffs every 4-6 hours as needed. -Encourage smoking cessation. -No oxygen requirements on ambulatory walk test today, she was able to maintain O2 >93% on room air   Vaccination Status Up to date with COVID-19 booster, flu shot, and RSV vaccine. -No further action needed at this time.  Follow-up in 3 months or sooner if symptoms worsen or mucus becomes colored or more productive.     Gina LELON Ferrari, NP 04/07/2023

## 2023-04-21 ENCOUNTER — Ambulatory Visit (INDEPENDENT_AMBULATORY_CARE_PROVIDER_SITE_OTHER): Payer: Medicare HMO

## 2023-04-21 DIAGNOSIS — I442 Atrioventricular block, complete: Secondary | ICD-10-CM

## 2023-04-22 LAB — CUP PACEART REMOTE DEVICE CHECK
Battery Remaining Longevity: 103 mo
Battery Voltage: 2.99 V
Brady Statistic AP VP Percent: 2.19 %
Brady Statistic AP VS Percent: 0 %
Brady Statistic AS VP Percent: 96.97 %
Brady Statistic AS VS Percent: 0.84 %
Brady Statistic RA Percent Paced: 2.37 %
Brady Statistic RV Percent Paced: 99.16 %
Date Time Interrogation Session: 20250121040337
Implantable Lead Connection Status: 753985
Implantable Lead Connection Status: 753985
Implantable Lead Implant Date: 20200722
Implantable Lead Implant Date: 20200722
Implantable Lead Location: 753859
Implantable Lead Location: 753860
Implantable Lead Model: 1944
Implantable Lead Model: 1948
Implantable Pulse Generator Implant Date: 20200722
Lead Channel Impedance Value: 399 Ohm
Lead Channel Impedance Value: 456 Ohm
Lead Channel Impedance Value: 513 Ohm
Lead Channel Impedance Value: 627 Ohm
Lead Channel Pacing Threshold Amplitude: 0.5 V
Lead Channel Pacing Threshold Amplitude: 0.5 V
Lead Channel Pacing Threshold Pulse Width: 0.4 ms
Lead Channel Pacing Threshold Pulse Width: 0.4 ms
Lead Channel Sensing Intrinsic Amplitude: 2 mV
Lead Channel Sensing Intrinsic Amplitude: 2 mV
Lead Channel Sensing Intrinsic Amplitude: 5 mV
Lead Channel Sensing Intrinsic Amplitude: 9.25 mV
Lead Channel Setting Pacing Amplitude: 1.5 V
Lead Channel Setting Pacing Amplitude: 2 V
Lead Channel Setting Pacing Pulse Width: 0.4 ms
Lead Channel Setting Sensing Sensitivity: 2 mV
Zone Setting Status: 755011
Zone Setting Status: 755011

## 2023-04-23 MED ORDER — ANORO ELLIPTA 62.5-25 MCG/ACT IN AEPB: 1.0000 | INHALATION_SPRAY | Freq: Every day | RESPIRATORY_TRACT | 5 refills | Status: DC

## 2023-04-23 NOTE — Telephone Encounter (Signed)
I have received the following message from pt "Have decided to stay with Anoro to avoid side effects of Trelegy. [facility] has not received a prescription for the Trelegy so please call in a prescription for the Anoro. If you need to call me my number is [phone]. Thank you. "  Beth please advise on if its ok to send in [facility]

## 2023-04-23 NOTE — Telephone Encounter (Signed)
Rx for Anoro has been sent

## 2023-04-24 ENCOUNTER — Other Ambulatory Visit: Payer: Self-pay | Admitting: Family Medicine

## 2023-04-24 DIAGNOSIS — L409 Psoriasis, unspecified: Secondary | ICD-10-CM

## 2023-04-24 MED ORDER — HALOBETASOL PROPIONATE 0.05 % EX CREA
TOPICAL_CREAM | Freq: Two times a day (BID) | CUTANEOUS | 2 refills | Status: DC
Start: 2023-04-24 — End: 2023-07-03

## 2023-04-25 ENCOUNTER — Other Ambulatory Visit: Payer: Self-pay | Admitting: Internal Medicine

## 2023-04-25 ENCOUNTER — Other Ambulatory Visit: Payer: Self-pay | Admitting: Family Medicine

## 2023-04-25 DIAGNOSIS — E782 Mixed hyperlipidemia: Secondary | ICD-10-CM

## 2023-05-27 ENCOUNTER — Encounter: Payer: Self-pay | Admitting: Internal Medicine

## 2023-06-03 NOTE — Addendum Note (Signed)
 Addended by: Geralyn Flash D on: 06/03/2023 09:51 AM   Modules accepted: Orders

## 2023-06-03 NOTE — Progress Notes (Signed)
 Remote pacemaker transmission.

## 2023-06-11 ENCOUNTER — Other Ambulatory Visit: Payer: Self-pay

## 2023-06-11 MED ORDER — ANORO ELLIPTA 62.5-25 MCG/ACT IN AEPB: 1.0000 | INHALATION_SPRAY | Freq: Every day | RESPIRATORY_TRACT | 5 refills | Status: DC

## 2023-06-22 ENCOUNTER — Emergency Department (HOSPITAL_COMMUNITY)

## 2023-06-22 ENCOUNTER — Emergency Department (HOSPITAL_COMMUNITY)
Admission: EM | Admit: 2023-06-22 | Discharge: 2023-06-22 | Disposition: A | Discharge status: Home / Self Care | Source: Home / Self Care | Attending: Emergency Medicine | Admitting: Emergency Medicine

## 2023-06-22 ENCOUNTER — Other Ambulatory Visit: Payer: Self-pay

## 2023-06-22 DIAGNOSIS — R059 Cough, unspecified: Secondary | ICD-10-CM | POA: Insufficient documentation

## 2023-06-22 DIAGNOSIS — I700 Atherosclerosis of aorta: Secondary | ICD-10-CM | POA: Diagnosis not present

## 2023-06-22 DIAGNOSIS — Z7982 Long term (current) use of aspirin: Secondary | ICD-10-CM | POA: Insufficient documentation

## 2023-06-22 DIAGNOSIS — R0902 Hypoxemia: Secondary | ICD-10-CM | POA: Diagnosis not present

## 2023-06-22 DIAGNOSIS — J189 Pneumonia, unspecified organism: Secondary | ICD-10-CM | POA: Insufficient documentation

## 2023-06-22 DIAGNOSIS — J441 Chronic obstructive pulmonary disease with (acute) exacerbation: Secondary | ICD-10-CM | POA: Insufficient documentation

## 2023-06-22 DIAGNOSIS — I10 Essential (primary) hypertension: Secondary | ICD-10-CM | POA: Diagnosis not present

## 2023-06-22 DIAGNOSIS — R062 Wheezing: Secondary | ICD-10-CM | POA: Diagnosis not present

## 2023-06-22 DIAGNOSIS — R918 Other nonspecific abnormal finding of lung field: Secondary | ICD-10-CM | POA: Diagnosis not present

## 2023-06-22 DIAGNOSIS — R0602 Shortness of breath: Secondary | ICD-10-CM | POA: Diagnosis not present

## 2023-06-22 LAB — CBC WITH DIFFERENTIAL/PLATELET
Abs Immature Granulocytes: 0.02 10*3/uL (ref 0.00–0.07)
Basophils Absolute: 0 10*3/uL (ref 0.0–0.1)
Basophils Relative: 0 %
Eosinophils Absolute: 0.2 10*3/uL (ref 0.0–0.5)
Eosinophils Relative: 3 %
HCT: 42.6 % (ref 36.0–46.0)
Hemoglobin: 13.5 g/dL (ref 12.0–15.0)
Immature Granulocytes: 0 %
Lymphocytes Relative: 24 %
Lymphs Abs: 1.8 10*3/uL (ref 0.7–4.0)
MCH: 30.3 pg (ref 26.0–34.0)
MCHC: 31.7 g/dL (ref 30.0–36.0)
MCV: 95.7 fL (ref 80.0–100.0)
Monocytes Absolute: 0.7 10*3/uL (ref 0.1–1.0)
Monocytes Relative: 9 %
Neutro Abs: 4.8 10*3/uL (ref 1.7–7.7)
Neutrophils Relative %: 64 %
Platelets: 135 10*3/uL — ABNORMAL LOW (ref 150–400)
RBC: 4.45 MIL/uL (ref 3.87–5.11)
RDW: 15.2 % (ref 11.5–15.5)
WBC: 7.5 10*3/uL (ref 4.0–10.5)
nRBC: 0 % (ref 0.0–0.2)

## 2023-06-22 LAB — COMPREHENSIVE METABOLIC PANEL
ALT: 26 U/L (ref 0–44)
AST: 55 U/L — ABNORMAL HIGH (ref 15–41)
Albumin: 3.6 g/dL (ref 3.5–5.0)
Alkaline Phosphatase: 70 U/L (ref 38–126)
Anion gap: 10 (ref 5–15)
BUN: 12 mg/dL (ref 8–23)
CO2: 23 mmol/L (ref 22–32)
Calcium: 8.8 mg/dL — ABNORMAL LOW (ref 8.9–10.3)
Chloride: 106 mmol/L (ref 98–111)
Creatinine, Ser: 0.82 mg/dL (ref 0.44–1.00)
GFR, Estimated: >60 mL/min (ref >60)
Glucose, Bld: 105 mg/dL — ABNORMAL HIGH (ref 70–99)
Potassium: 3.8 mmol/L (ref 3.5–5.1)
Sodium: 139 mmol/L (ref 135–145)
Total Bilirubin: 0.9 mg/dL (ref 0.0–1.2)
Total Protein: 6 g/dL — ABNORMAL LOW (ref 6.5–8.1)

## 2023-06-22 LAB — TROPONIN I (HIGH SENSITIVITY)
Troponin I (High Sensitivity): 9 ng/L (ref <18)
Troponin I (High Sensitivity): 12 ng/L (ref <18)

## 2023-06-22 LAB — RESP PANEL BY RT-PCR (RSV, FLU A&B, COVID)  RVPGX2
Influenza A by PCR: NEGATIVE
Influenza B by PCR: NEGATIVE
Resp Syncytial Virus by PCR: NEGATIVE
SARS Coronavirus 2 by RT PCR: NEGATIVE

## 2023-06-22 MED ORDER — PREDNISONE 50 MG PO TABS: 50.0000 mg | ORAL_TABLET | Freq: Every day | ORAL | 0 refills | Status: DC

## 2023-06-22 MED ORDER — IPRATROPIUM-ALBUTEROL 0.5-2.5 (3) MG/3ML IN SOLN
3.0000 mL | Freq: Once | RESPIRATORY_TRACT | Status: AC
  Administered 2023-06-22: 3 mL via RESPIRATORY_TRACT
  Filled 2023-06-22: qty 3

## 2023-06-22 MED ORDER — AZITHROMYCIN 500 MG IV SOLR
500.0000 mg | Freq: Once | INTRAVENOUS | Status: AC
  Administered 2023-06-22: 500 mg via INTRAVENOUS
  Filled 2023-06-22: qty 5

## 2023-06-22 MED ORDER — AZITHROMYCIN 500 MG PO TABS: 500.0000 mg | ORAL_TABLET | Freq: Every day | ORAL | 0 refills | Status: DC

## 2023-06-22 NOTE — ED Provider Notes (Signed)
 [place] EMERGENCY DEPARTMENT AT [facility] Provider Note   CSN: [id number] Arrival date & time: 06/22/23  4098     History  Chief Complaint  Patient presents with   Shortness of Breath    [name] is a 88 y.o. female.  With a history of COPD, status post TAVR, heart block status post PPM who presents to the ED for shortness of breath.  Patient reports 1 to 2 days of increased coughing and shortness of breath at home.  Increase sputum production white in color but thicker than usual.  Called EMS given shortness of breath this morning.  EMS noted to be hypoxic 85% on room air and she is typically 92% at baseline.  Improvement after DuoNeb treatment and 125 mg Solu-Medrol.  Patient does report some chest tightness but denies chest pain, nausea, vomiting, diarrhea, abdominal pain and fevers.   Shortness of Breath      Home Medications Prior to Admission medications   Medication Sig Start Date End Date Taking? Authorizing Provider  azithromycin (ZITHROMAX) 500 MG tablet Take 1 tablet (500 mg total) by mouth daily for 5 days. 06/22/23 06/27/23 Yes Royanne Foots, DO  predniSONE (DELTASONE) 50 MG tablet Take 1 tablet (50 mg total) by mouth daily with breakfast for 4 days. 06/22/23 06/26/23 Yes Royanne Foots, DO  acetaminophen (TYLENOL) 325 MG tablet Take 2 tablets (650 mg total) by mouth every 6 (six) hours as needed for moderate pain. 07/26/21   Wallis Bamberg, PA-C  albuterol (VENTOLIN HFA) 108 (90 Base) MCG/ACT inhaler INHALE 2 PUFFS BY MOUTH EVERY 4 HOURS AS NEEDED FOR WHEEZING FOR SHORTNESS OF BREATH 03/23/23   Sandre Kitty, MD  amLODipine (NORVASC) 10 MG tablet TAKE 1 TABLET BY MOUTH ONCE DAILY . APPOINTMENT REQUIRED FOR FUTURE REFILLS WITH  CARDIOLOGIST  IN  OCTOBER  2024,  FINAL  ATTEMPT 02/16/23   Kathleene Hazel, MD  aspirin 81 MG chewable tablet Chew 1 tablet (81 mg total) by mouth daily. 12/23/18   Barrett, Rae Roam, PA-C  carboxymethylcellulose (REFRESH  PLUS) 0.5 % SOLN Place 1 drop into both eyes daily as needed (dry eyes).    [provider]  Cholecalciferol (VITAMIN D-3) 125 MCG (5000 UT) TABS Take 5,000 Units by mouth daily.    [provider]  citalopram (CELEXA) 40 MG tablet Take 1/2 (one-half) tablet by mouth once daily 02/16/23   Sandre Kitty, MD  furosemide (LASIX) 20 MG tablet Take 1 tablet (20 mg total) by mouth daily. 02/02/23   Kathleene Hazel, MD  halobetasol (ULTRAVATE) 0.05 % cream Apply topically 2 (two) times daily. 04/24/23   Sandre Kitty, MD  hydrOXYzine (VISTARIL) 50 MG capsule Take 1 capsule (50 mg total) by mouth 3 (three) times daily as needed for anxiety. 01/22/23   Sandre Kitty, MD  Omega-3 Fatty Acids (FISH OIL) 1000 MG CAPS Take 1,000 mg by mouth daily.    [provider]  pantoprazole (PROTONIX) 20 MG tablet Take 1 tablet by mouth once daily 04/27/23   Iva Boop, MD  simvastatin (ZOCOR) 20 MG tablet Take 1 tablet by mouth once daily 04/27/23   Sandre Kitty, MD  TRELEGY ELLIPTA 100-62.5-25 MCG/ACT AEPB Inhale 1 puff into the lungs daily. 06/11/23   [provider]  umeclidinium-vilanterol (ANORO ELLIPTA) 62.5-25 MCG/ACT AEPB Inhale 1 puff into the lungs daily. 06/11/23   Glenford Bayley, NP  vitamin B-12 (CYANOCOBALAMIN) 500 MCG tablet Take 500  mcg by mouth daily.    [provider]  vitamin E 180 MG (400 UNITS) capsule Take 400 Units by mouth daily.    [provider]      Allergies    Patient has no known allergies.    Review of Systems   Review of Systems  Respiratory:  Positive for shortness of breath.     Physical Exam Updated Vital Signs BP (!) 128/56   Pulse 94   Temp 97.8 F (36.6 C) (Oral)   Resp 20   Ht 5' 3.5" (1.613 m)   Wt 47.6 kg   SpO2 93%   BMI 18.31 kg/m  Physical Exam Vitals and nursing note reviewed.  HENT:     Head: Normocephalic and atraumatic.  Eyes:     Pupils: Pupils are equal, round, and reactive  to light.  Cardiovascular:     Rate and Rhythm: Normal rate and regular rhythm.  Pulmonary:     Effort: Pulmonary effort is normal.     Breath sounds: Examination of the right-upper field reveals wheezing. Examination of the left-upper field reveals wheezing. Examination of the right-lower field reveals rales. Wheezing and rales present.  Abdominal:     Palpations: Abdomen is soft.     Tenderness: There is no abdominal tenderness.  Skin:    General: Skin is warm and dry.  Neurological:     Mental Status: She is alert.  Psychiatric:        Mood and Affect: Mood normal.     ED Results / Procedures / Treatments   Labs (all labs ordered are listed, but only abnormal results are displayed) Labs Reviewed  CBC WITH DIFFERENTIAL/PLATELET - Abnormal; Notable for the following components:      Result Value   Platelets 135 (*)    All other components within normal limits  COMPREHENSIVE METABOLIC PANEL - Abnormal; Notable for the following components:   Glucose, Bld 105 (*)    Calcium 8.8 (*)    Total Protein 6.0 (*)    AST 55 (*)    All other components within normal limits  RESP PANEL BY RT-PCR (RSV, FLU A&B, COVID)  RVPGX2  TROPONIN I (HIGH SENSITIVITY)  TROPONIN I (HIGH SENSITIVITY)    EKG EKG Interpretation Date/Time:  Monday June 22 2023 08:21:19 EDT Ventricular Rate:  82 PR Interval:  9 QRS Duration:  166 QT Interval:  454 QTC Calculation: 531 R Axis:   -72  Text Interpretation: A-V dual-paced complexes w/ some inhibition No further analysis attempted due to paced rhythm Confirmed by Estelle June [phone]) on 06/22/2023 12:23:06 PM  Radiology DG Chest Portable 1 View Result Date: 06/22/2023 CLINICAL DATA:  Breathing problems EXAM: PORTABLE CHEST 1 VIEW COMPARISON:  07/04/2020 FINDINGS: Left-sided implanted cardiac device. The heart size and mediastinal contours are within normal limits. Prior TAVR. Aortic atherosclerosis. Right infrahilar interstitial opacity. There also  mildly prominent interstitial markings at the left lung base. No pleural effusion or pneumothorax. The visualized skeletal structures are unremarkable. IMPRESSION: Right infrahilar interstitial opacity, which may represent atelectasis versus pneumonia. Electronically Signed   By: Duanne Guess D.O.   On: 06/22/2023 09:38    Procedures Procedures    Medications Ordered in ED Medications  ipratropium-albuterol (DUONEB) 0.5-2.5 (3) MG/3ML nebulizer solution 3 mL (3 mLs Nebulization Given 06/22/23 0827)  azithromycin (ZITHROMAX) 500 mg in sodium chloride 0.9 % 250 mL IVPB (0 mg Intravenous Stopped 06/22/23 1108)    ED Course/ Medical Decision Making/ A&P Clinical Course as  of 06/22/23 1235  Mon Jun 22, 2023  1223 Reviewed laboratory workup.  No leukocytosis or severe anemia.  COVID RSV influenza all negative.  No ischemic changes on EKG.  High sensitive troponin not elevated.  Low suspicion for ACS at this time.  Chest x-ray shows question of right sided infiltrate.  Will treat empirically for community-acquired pneumonia and COPD patient.  Stable on room air with no shortness of breath currently.  Appropriate for discharge with azithromycin prescription for pneumonia and a couple more days of steroids for COPD exacerbation.  She has albuterol treatment and inhaled corticosteroids at home [MP]    Clinical Course User Index [MP] Royanne Foots, DO                                 Medical Decision Making 88 year old female with history as above presenting for shortness of breath x 1 to 2 days.  Increased sputum production.  Afebrile slightly hypertensive here.  Significant improvement after DuoNeb and steroids from EMS.  She does have some wheezing with some crackling on the right concerning for potential pneumonia.  Will give another DuoNeb treatment cover with azithromycin and obtain laboratory workup to evaluate for pneumonia, anemia, electrolyte imbalance.  Also has a history of heart block  with PPM.  Will obtain outside high-sensitivity troponin EKG to evaluate for ACS, dysrhythmia.  Will obtain chest x-ray to look for pneumonia.  Amount and/or Complexity of Data Reviewed Labs: ordered. Radiology: ordered.  Risk Prescription drug management.           Final Clinical Impression(s) / ED Diagnoses Final diagnoses:  COPD exacerbation (HCC)  Community acquired pneumonia of right lung, unspecified part of lung    Rx / DC Orders ED Discharge Orders          Ordered    azithromycin (ZITHROMAX) 500 MG tablet  Daily        06/22/23 1234    predniSONE (DELTASONE) 50 MG tablet  Daily with breakfast        06/22/23 1234              Royanne Foots, DO 06/22/23 1235

## 2023-06-22 NOTE — Discharge Instructions (Signed)
 You were seen in the emerged department for shortness of breath Your shortness of breath improved after DuoNeb treatment, steroids here You may have a pneumonia on the right side For this reason we gave you dose of antibiotics here in the ED and have called the rest of your pharmacy Please pick up the prescribed antibiotics and begin taking as directed for treatment of pneumonia We have also called in a few more days of steroids for you to take for your COPD exacerbation Return to the emergency department for trouble breathing, chest pain or any other concerns Otherwise follow-up with your primary doctor within 1 week for reevaluation

## 2023-06-22 NOTE — ED Notes (Signed)
 Patient discharged by this RN. Patient verbalizes understanding of instructions with no additional questions for this RN.

## 2023-06-22 NOTE — ED Triage Notes (Signed)
 Patient arrives via [facility] for shortness of breath, possible bronchitis. Hx of COPD. Room air sat 85%, baseline O2 92%. 1 duoneb with relief, able to now speak in complete sentences. Afebrile with EMS. 20 forearm, 125 solumedrol. Pacemaker with HR at 70. BP 144/62. 100 in duoneb at 8 liters.

## 2023-06-22 NOTE — ED Notes (Signed)
 Pt given [place] sandwich

## 2023-06-23 ENCOUNTER — Other Ambulatory Visit: Payer: Self-pay

## 2023-06-23 ENCOUNTER — Emergency Department (HOSPITAL_COMMUNITY)

## 2023-06-23 ENCOUNTER — Encounter (HOSPITAL_COMMUNITY): Payer: Self-pay

## 2023-06-23 ENCOUNTER — Inpatient Hospital Stay (HOSPITAL_COMMUNITY)
Admission: EM | Admit: 2023-06-23 | Discharge: 2023-07-03 | DRG: 193 | Disposition: A | Discharge status: Skilled Nursing Facility | Attending: Infectious Diseases | Admitting: Infectious Diseases

## 2023-06-23 DIAGNOSIS — Z950 Presence of cardiac pacemaker: Secondary | ICD-10-CM

## 2023-06-23 DIAGNOSIS — F411 Generalized anxiety disorder: Secondary | ICD-10-CM | POA: Diagnosis present

## 2023-06-23 DIAGNOSIS — I5023 Acute on chronic systolic (congestive) heart failure: Secondary | ICD-10-CM

## 2023-06-23 DIAGNOSIS — Z751 Person awaiting admission to adequate facility elsewhere: Secondary | ICD-10-CM

## 2023-06-23 DIAGNOSIS — J206 Acute bronchitis due to rhinovirus: Secondary | ICD-10-CM | POA: Diagnosis not present

## 2023-06-23 DIAGNOSIS — J449 Chronic obstructive pulmonary disease, unspecified: Secondary | ICD-10-CM | POA: Diagnosis present

## 2023-06-23 DIAGNOSIS — J9622 Acute and chronic respiratory failure with hypercapnia: Secondary | ICD-10-CM | POA: Diagnosis not present

## 2023-06-23 DIAGNOSIS — B9789 Other viral agents as the cause of diseases classified elsewhere: Secondary | ICD-10-CM | POA: Diagnosis present

## 2023-06-23 DIAGNOSIS — I5032 Chronic diastolic (congestive) heart failure: Secondary | ICD-10-CM | POA: Diagnosis present

## 2023-06-23 DIAGNOSIS — E43 Unspecified severe protein-calorie malnutrition: Secondary | ICD-10-CM | POA: Diagnosis present

## 2023-06-23 DIAGNOSIS — F17210 Nicotine dependence, cigarettes, uncomplicated: Secondary | ICD-10-CM | POA: Diagnosis not present

## 2023-06-23 DIAGNOSIS — Z9842 Cataract extraction status, left eye: Secondary | ICD-10-CM

## 2023-06-23 DIAGNOSIS — F32A Depression, unspecified: Secondary | ICD-10-CM | POA: Diagnosis present

## 2023-06-23 DIAGNOSIS — R0600 Dyspnea, unspecified: Secondary | ICD-10-CM | POA: Diagnosis not present

## 2023-06-23 DIAGNOSIS — Z801 Family history of malignant neoplasm of trachea, bronchus and lung: Secondary | ICD-10-CM

## 2023-06-23 DIAGNOSIS — I110 Hypertensive heart disease with heart failure: Secondary | ICD-10-CM

## 2023-06-23 DIAGNOSIS — E785 Hyperlipidemia, unspecified: Secondary | ICD-10-CM | POA: Diagnosis present

## 2023-06-23 DIAGNOSIS — R0602 Shortness of breath: Secondary | ICD-10-CM | POA: Diagnosis not present

## 2023-06-23 DIAGNOSIS — E46 Unspecified protein-calorie malnutrition: Secondary | ICD-10-CM | POA: Insufficient documentation

## 2023-06-23 DIAGNOSIS — E8729 Other acidosis: Secondary | ICD-10-CM | POA: Diagnosis present

## 2023-06-23 DIAGNOSIS — I2510 Atherosclerotic heart disease of native coronary artery without angina pectoris: Secondary | ICD-10-CM | POA: Diagnosis present

## 2023-06-23 DIAGNOSIS — Z8249 Family history of ischemic heart disease and other diseases of the circulatory system: Secondary | ICD-10-CM

## 2023-06-23 DIAGNOSIS — J440 Chronic obstructive pulmonary disease with acute lower respiratory infection: Secondary | ICD-10-CM | POA: Diagnosis not present

## 2023-06-23 DIAGNOSIS — Z8711 Personal history of peptic ulcer disease: Secondary | ICD-10-CM

## 2023-06-23 DIAGNOSIS — Z1152 Encounter for screening for COVID-19: Secondary | ICD-10-CM

## 2023-06-23 DIAGNOSIS — I10 Essential (primary) hypertension: Secondary | ICD-10-CM | POA: Diagnosis present

## 2023-06-23 DIAGNOSIS — Z79899 Other long term (current) drug therapy: Secondary | ICD-10-CM

## 2023-06-23 DIAGNOSIS — J441 Chronic obstructive pulmonary disease with (acute) exacerbation: Secondary | ICD-10-CM | POA: Diagnosis not present

## 2023-06-23 DIAGNOSIS — J189 Pneumonia, unspecified organism: Principal | ICD-10-CM | POA: Diagnosis present

## 2023-06-23 DIAGNOSIS — J9621 Acute and chronic respiratory failure with hypoxia: Secondary | ICD-10-CM | POA: Diagnosis present

## 2023-06-23 DIAGNOSIS — I350 Nonrheumatic aortic (valve) stenosis: Secondary | ICD-10-CM | POA: Diagnosis present

## 2023-06-23 DIAGNOSIS — Z7982 Long term (current) use of aspirin: Secondary | ICD-10-CM

## 2023-06-23 DIAGNOSIS — Z681 Body mass index (BMI) 19 or less, adult: Secondary | ICD-10-CM

## 2023-06-23 DIAGNOSIS — Z961 Presence of intraocular lens: Secondary | ICD-10-CM | POA: Diagnosis present

## 2023-06-23 DIAGNOSIS — L409 Psoriasis, unspecified: Secondary | ICD-10-CM

## 2023-06-23 DIAGNOSIS — Z820 Family history of epilepsy and other diseases of the nervous system: Secondary | ICD-10-CM

## 2023-06-23 DIAGNOSIS — Z9841 Cataract extraction status, right eye: Secondary | ICD-10-CM

## 2023-06-23 DIAGNOSIS — K5900 Constipation, unspecified: Secondary | ICD-10-CM | POA: Diagnosis present

## 2023-06-23 DIAGNOSIS — E441 Mild protein-calorie malnutrition: Secondary | ICD-10-CM | POA: Diagnosis not present

## 2023-06-23 DIAGNOSIS — Z8619 Personal history of other infectious and parasitic diseases: Secondary | ICD-10-CM

## 2023-06-23 DIAGNOSIS — R0902 Hypoxemia: Secondary | ICD-10-CM | POA: Diagnosis not present

## 2023-06-23 DIAGNOSIS — E7800 Pure hypercholesterolemia, unspecified: Secondary | ICD-10-CM | POA: Diagnosis present

## 2023-06-23 DIAGNOSIS — Z8616 Personal history of COVID-19: Secondary | ICD-10-CM

## 2023-06-23 DIAGNOSIS — J984 Other disorders of lung: Secondary | ICD-10-CM | POA: Diagnosis present

## 2023-06-23 DIAGNOSIS — J9601 Acute respiratory failure with hypoxia: Secondary | ICD-10-CM | POA: Diagnosis not present

## 2023-06-23 DIAGNOSIS — I442 Atrioventricular block, complete: Secondary | ICD-10-CM | POA: Diagnosis present

## 2023-06-23 DIAGNOSIS — R059 Cough, unspecified: Secondary | ICD-10-CM | POA: Diagnosis not present

## 2023-06-23 DIAGNOSIS — Z90710 Acquired absence of both cervix and uterus: Secondary | ICD-10-CM

## 2023-06-23 DIAGNOSIS — Z952 Presence of prosthetic heart valve: Secondary | ICD-10-CM

## 2023-06-23 DIAGNOSIS — R918 Other nonspecific abnormal finding of lung field: Secondary | ICD-10-CM | POA: Diagnosis not present

## 2023-06-23 LAB — COMPREHENSIVE METABOLIC PANEL
ALT: 32 U/L (ref 0–44)
AST: 58 U/L — ABNORMAL HIGH (ref 15–41)
Albumin: 3.2 g/dL — ABNORMAL LOW (ref 3.5–5.0)
Alkaline Phosphatase: 59 U/L (ref 38–126)
Anion gap: 11 (ref 5–15)
BUN: 18 mg/dL (ref 8–23)
CO2: 22 mmol/L (ref 22–32)
Calcium: 8.4 mg/dL — ABNORMAL LOW (ref 8.9–10.3)
Chloride: 105 mmol/L (ref 98–111)
Creatinine, Ser: 0.88 mg/dL (ref 0.44–1.00)
GFR, Estimated: >60 mL/min (ref >60)
Glucose, Bld: 115 mg/dL — ABNORMAL HIGH (ref 70–99)
Potassium: 3.5 mmol/L (ref 3.5–5.1)
Sodium: 138 mmol/L (ref 135–145)
Total Bilirubin: 0.7 mg/dL (ref 0.0–1.2)
Total Protein: 5.5 g/dL — ABNORMAL LOW (ref 6.5–8.1)

## 2023-06-23 LAB — BLOOD GAS, VENOUS
Acid-Base Excess: 2.4 mmol/L — ABNORMAL HIGH (ref 0.0–2.0)
Bicarbonate: 30.5 mmol/L — ABNORMAL HIGH (ref 20.0–28.0)
Drawn by: 7754
O2 Saturation: 52.9 %
Patient temperature: 36.6
pCO2, Ven: 61 mmHg — ABNORMAL HIGH (ref 44–60)
pH, Ven: 7.31 (ref 7.25–7.43)
pO2, Ven: 33 mmHg (ref 32–45)

## 2023-06-23 LAB — CBC WITH DIFFERENTIAL/PLATELET
Abs Immature Granulocytes: 0.03 10*3/uL (ref 0.00–0.07)
Basophils Absolute: 0 10*3/uL (ref 0.0–0.1)
Basophils Relative: 0 %
Eosinophils Absolute: 0 10*3/uL (ref 0.0–0.5)
Eosinophils Relative: 0 %
HCT: 40.5 % (ref 36.0–46.0)
Hemoglobin: 13.5 g/dL (ref 12.0–15.0)
Immature Granulocytes: 0 %
Lymphocytes Relative: 12 %
Lymphs Abs: 1 10*3/uL (ref 0.7–4.0)
MCH: 30.3 pg (ref 26.0–34.0)
MCHC: 33.3 g/dL (ref 30.0–36.0)
MCV: 90.8 fL (ref 80.0–100.0)
Monocytes Absolute: 0.7 10*3/uL (ref 0.1–1.0)
Monocytes Relative: 8 %
Neutro Abs: 6.7 10*3/uL (ref 1.7–7.7)
Neutrophils Relative %: 80 %
Platelets: 181 10*3/uL (ref 150–400)
RBC: 4.46 MIL/uL (ref 3.87–5.11)
RDW: 15.4 % (ref 11.5–15.5)
WBC: 8.4 10*3/uL (ref 4.0–10.5)
nRBC: 0 % (ref 0.0–0.2)

## 2023-06-23 LAB — I-STAT CHEM 8, ED
BUN: 19 mg/dL (ref 8–23)
Calcium, Ion: 1.14 mmol/L — ABNORMAL LOW (ref 1.15–1.40)
Chloride: 102 mmol/L (ref 98–111)
Creatinine, Ser: 1 mg/dL (ref 0.44–1.00)
Glucose, Bld: 120 mg/dL — ABNORMAL HIGH (ref 70–99)
HCT: 36 % (ref 36.0–46.0)
Hemoglobin: 12.2 g/dL (ref 12.0–15.0)
Potassium: 3.9 mmol/L (ref 3.5–5.1)
Sodium: 138 mmol/L (ref 135–145)
TCO2: 25 mmol/L (ref 22–32)

## 2023-06-23 LAB — I-STAT CG4 LACTIC ACID, ED
Lactic Acid, Venous: 1.5 mmol/L (ref 0.5–1.9)
Lactic Acid, Venous: 1.8 mmol/L (ref 0.5–1.9)

## 2023-06-23 MED ORDER — IPRATROPIUM-ALBUTEROL 0.5-2.5 (3) MG/3ML IN SOLN: 3.0000 mL | RESPIRATORY_TRACT | Status: DC

## 2023-06-23 MED ORDER — GUAIFENESIN ER 600 MG PO TB12
600.0000 mg | ORAL_TABLET | Freq: Two times a day (BID) | ORAL | Status: DC
  Administered 2023-06-24 – 2023-06-25 (×4): 600 mg via ORAL
  Filled 2023-06-23 (×4): qty 1

## 2023-06-23 MED ORDER — SIMVASTATIN 20 MG PO TABS
20.0000 mg | ORAL_TABLET | Freq: Every day | ORAL | Status: DC
  Administered 2023-06-23 – 2023-07-03 (×11): 20 mg via ORAL
  Filled 2023-06-23 (×11): qty 1

## 2023-06-23 MED ORDER — IPRATROPIUM-ALBUTEROL 0.5-2.5 (3) MG/3ML IN SOLN
3.0000 mL | RESPIRATORY_TRACT | Status: AC
  Administered 2023-06-23 – 2023-06-25 (×13): 3 mL via RESPIRATORY_TRACT
  Filled 2023-06-23 (×13): qty 3

## 2023-06-23 MED ORDER — LEVALBUTEROL HCL 1.25 MG/0.5ML IN NEBU
1.2500 mg | INHALATION_SOLUTION | Freq: Four times a day (QID) | RESPIRATORY_TRACT | Status: DC
Start: 2023-06-23 — End: 2023-06-23
  Filled 2023-06-23: qty 0.5

## 2023-06-23 MED ORDER — CITALOPRAM HYDROBROMIDE 20 MG PO TABS
20.0000 mg | ORAL_TABLET | Freq: Every day | ORAL | Status: DC
  Administered 2023-06-23 – 2023-07-03 (×11): 20 mg via ORAL
  Filled 2023-06-23 (×11): qty 1

## 2023-06-23 MED ORDER — LACTATED RINGERS IV BOLUS
1000.0000 mL | Freq: Once | INTRAVENOUS | Status: AC
  Administered 2023-06-23: 1000 mL via INTRAVENOUS

## 2023-06-23 MED ORDER — RIVAROXABAN 10 MG PO TABS
10.0000 mg | ORAL_TABLET | Freq: Every day | ORAL | Status: DC
Start: 2023-06-23 — End: 2023-06-26
  Administered 2023-06-24 – 2023-06-25 (×2): 10 mg via ORAL
  Filled 2023-06-23 (×5): qty 1

## 2023-06-23 MED ORDER — IPRATROPIUM-ALBUTEROL 0.5-2.5 (3) MG/3ML IN SOLN
3.0000 mL | Freq: Four times a day (QID) | RESPIRATORY_TRACT | Status: DC | PRN
  Administered 2023-06-27: 3 mL via RESPIRATORY_TRACT

## 2023-06-23 MED ORDER — IPRATROPIUM-ALBUTEROL 0.5-2.5 (3) MG/3ML IN SOLN
3.0000 mL | Freq: Once | RESPIRATORY_TRACT | Status: AC
  Administered 2023-06-23: 3 mL via RESPIRATORY_TRACT
  Filled 2023-06-23: qty 3

## 2023-06-23 MED ORDER — AZITHROMYCIN 500 MG PO TABS
500.0000 mg | ORAL_TABLET | Freq: Every day | ORAL | Status: AC
  Administered 2023-06-24 – 2023-06-25 (×2): 500 mg via ORAL
  Filled 2023-06-23 (×2): qty 1

## 2023-06-23 MED ORDER — SODIUM CHLORIDE 0.9 % IV SOLN
500.0000 mg | Freq: Once | INTRAVENOUS | Status: AC
  Administered 2023-06-23: 500 mg via INTRAVENOUS
  Filled 2023-06-23: qty 5

## 2023-06-23 MED ORDER — BUDESONIDE 0.25 MG/2ML IN SUSP
0.2500 mg | Freq: Two times a day (BID) | RESPIRATORY_TRACT | Status: DC
  Administered 2023-06-23 – 2023-07-01 (×17): 0.25 mg via RESPIRATORY_TRACT
  Filled 2023-06-23 (×17): qty 2

## 2023-06-23 MED ORDER — SODIUM CHLORIDE 0.9 % IV SOLN
1.0000 g | Freq: Once | INTRAVENOUS | Status: AC
  Administered 2023-06-23: 1 g via INTRAVENOUS
  Filled 2023-06-23: qty 10

## 2023-06-23 MED ORDER — ALBUTEROL SULFATE (2.5 MG/3ML) 0.083% IN NEBU
2.5000 mg | INHALATION_SOLUTION | RESPIRATORY_TRACT | Status: DC | PRN
  Administered 2023-06-23: 2.5 mg via RESPIRATORY_TRACT
  Filled 2023-06-23: qty 3

## 2023-06-23 MED ORDER — SODIUM CHLORIDE 0.9 % IV SOLN
1.0000 g | INTRAVENOUS | Status: AC
  Administered 2023-06-24 – 2023-06-27 (×4): 1 g via INTRAVENOUS
  Filled 2023-06-23 (×4): qty 10

## 2023-06-23 MED ORDER — ARFORMOTEROL TARTRATE 15 MCG/2ML IN NEBU
15.0000 ug | INHALATION_SOLUTION | Freq: Two times a day (BID) | RESPIRATORY_TRACT | Status: DC
Start: 2023-06-23 — End: 2023-07-02
  Administered 2023-06-23 – 2023-07-01 (×15): 15 ug via RESPIRATORY_TRACT
  Filled 2023-06-23 (×17): qty 2

## 2023-06-23 MED ORDER — REVEFENACIN 175 MCG/3ML IN SOLN
175.0000 ug | Freq: Every day | RESPIRATORY_TRACT | Status: DC
  Administered 2023-06-23: 175 ug via RESPIRATORY_TRACT
  Filled 2023-06-23: qty 3

## 2023-06-23 MED ORDER — PREDNISONE 20 MG PO TABS
40.0000 mg | ORAL_TABLET | Freq: Every day | ORAL | Status: DC
  Administered 2023-06-23 – 2023-06-24 (×2): 40 mg via ORAL
  Filled 2023-06-23 (×2): qty 2

## 2023-06-23 MED ORDER — ALBUTEROL SULFATE (2.5 MG/3ML) 0.083% IN NEBU
2.5000 mg | INHALATION_SOLUTION | RESPIRATORY_TRACT | Status: DC
  Administered 2023-06-23: 2.5 mg via RESPIRATORY_TRACT
  Filled 2023-06-23: qty 3

## 2023-06-23 NOTE — Progress Notes (Signed)
 Pt showing dyspnea at rest and using accessory muscles to breathe. RT called at 2031 for scheduled breathing treatment and assessment. Pt reassessed and still having dyspnea. RT called again at 2149 due to patient still needing scheduled breathing treatment.  Pt received breathing treatment at 2154. Pt's vitals BP 147/54, HR 109, RR 24. MEWS yellow. Consulting civil engineer notified. MD paged. Will continue to monitor

## 2023-06-23 NOTE — ED Provider Notes (Signed)
 [place] EMERGENCY DEPARTMENT AT [facility] Provider Note   CSN: [id number] Arrival date & time: 06/23/23  0754     History  Chief Complaint  Patient presents with   Pneumonia    [name] is a 88 y.o. female.  This is a 88 year old female presents emergency department today due to worsening shortness of breath, cough, dyspnea on exertion.  Patient seen yesterday, discharged on azithromycin, prednisone.  85% on room air when EMS arrived.   Pneumonia       Home Medications Prior to Admission medications   Medication Sig Start Date End Date Taking? Authorizing Provider  acetaminophen (TYLENOL) 325 MG tablet Take 2 tablets (650 mg total) by mouth every 6 (six) hours as needed for moderate pain. 07/26/21   Wallis Bamberg, PA-C  albuterol (VENTOLIN HFA) 108 (90 Base) MCG/ACT inhaler INHALE 2 PUFFS BY MOUTH EVERY 4 HOURS AS NEEDED FOR WHEEZING FOR SHORTNESS OF BREATH 03/23/23   Sandre Kitty, MD  amLODipine (NORVASC) 10 MG tablet TAKE 1 TABLET BY MOUTH ONCE DAILY . APPOINTMENT REQUIRED FOR FUTURE REFILLS WITH  CARDIOLOGIST  IN  OCTOBER  2024,  FINAL  ATTEMPT 02/16/23   Kathleene Hazel, MD  aspirin 81 MG chewable tablet Chew 1 tablet (81 mg total) by mouth daily. 12/23/18   Barrett, Erin R, PA-C  azithromycin (ZITHROMAX) 500 MG tablet Take 1 tablet (500 mg total) by mouth daily for 5 days. 06/22/23 06/27/23  Royanne Foots, DO  carboxymethylcellulose (REFRESH PLUS) 0.5 % SOLN Place 1 drop into both eyes daily as needed (dry eyes).    [provider]  Cholecalciferol (VITAMIN D-3) 125 MCG (5000 UT) TABS Take 5,000 Units by mouth daily.    [provider]  citalopram (CELEXA) 40 MG tablet Take 1/2 (one-half) tablet by mouth once daily 02/16/23   Sandre Kitty, MD  furosemide (LASIX) 20 MG tablet Take 1 tablet (20 mg total) by mouth daily. 02/02/23   Kathleene Hazel, MD  halobetasol (ULTRAVATE) 0.05 % cream Apply topically 2 (two) times  daily. 04/24/23   Sandre Kitty, MD  hydrOXYzine (VISTARIL) 50 MG capsule Take 1 capsule (50 mg total) by mouth 3 (three) times daily as needed for anxiety. 01/22/23   Sandre Kitty, MD  Omega-3 Fatty Acids (FISH OIL) 1000 MG CAPS Take 1,000 mg by mouth daily.    [provider]  pantoprazole (PROTONIX) 20 MG tablet Take 1 tablet by mouth once daily 04/27/23   Iva Boop, MD  predniSONE (DELTASONE) 50 MG tablet Take 1 tablet (50 mg total) by mouth daily with breakfast for 4 days. 06/22/23 06/26/23  Royanne Foots, DO  simvastatin (ZOCOR) 20 MG tablet Take 1 tablet by mouth once daily 04/27/23   Sandre Kitty, MD  TRELEGY ELLIPTA 100-62.5-25 MCG/ACT AEPB Inhale 1 puff into the lungs daily. 06/11/23   [provider]  umeclidinium-vilanterol (ANORO ELLIPTA) 62.5-25 MCG/ACT AEPB Inhale 1 puff into the lungs daily. 06/11/23   Glenford Bayley, NP  vitamin B-12 (CYANOCOBALAMIN) 500 MCG tablet Take 500 mcg by mouth daily.    [provider]  vitamin E 180 MG (400 UNITS) capsule Take 400 Units by mouth daily.    [provider]      Allergies    Patient has no known allergies.    Review of Systems   Review of Systems  Physical Exam Updated Vital Signs BP (!) 108/48   Pulse 79  Temp 97.9 F (36.6 C) (Oral)   Resp (!) 21   Ht 5\' 3"  (1.6 m)   Wt 48 kg   SpO2 94%   BMI 18.75 kg/m  Physical Exam Vitals reviewed.  Constitutional:      Appearance: She is not toxic-appearing.  Cardiovascular:     Rate and Rhythm: Normal rate.  Pulmonary:     Effort: Pulmonary effort is normal.     Comments: Right-sided rhonchi, trace wheezing.  Air entry bilaterally. Skin:    General: Skin is warm.  Neurological:     Mental Status: She is alert.     ED Results / Procedures / Treatments   Labs (all labs ordered are listed, but only abnormal results are displayed) Labs Reviewed  I-STAT CHEM 8, ED - Abnormal; Notable for the following components:       Result Value   Glucose, Bld 120 (*)    Calcium, Ion 1.14 (*)    All other components within normal limits  CULTURE, BLOOD (ROUTINE X 2)  CULTURE, BLOOD (ROUTINE X 2)  CBC WITH DIFFERENTIAL/PLATELET  COMPREHENSIVE METABOLIC PANEL  I-STAT CG4 LACTIC ACID, ED  I-STAT CG4 LACTIC ACID, ED    EKG EKG Interpretation Date/Time:  Tuesday June 23 2023 08:00:33 EDT Ventricular Rate:  92 PR Interval:  201 QRS Duration:  167 QT Interval:  425 QTC Calculation: 526 R Axis:   -80  Text Interpretation: Atrial-sensed ventricular-paced rhythm No further analysis attempted due to paced rhythm Confirmed by Anders Simmonds [phone]) on 06/23/2023 8:04:20 AM  Radiology DG Chest Port 1 View Result Date: 06/23/2023 CLINICAL DATA:  Cough and shortness of breath.  Questionable sepsis EXAM: PORTABLE CHEST 1 VIEW COMPARISON:  Chest radiograph dated 06/22/2023 FINDINGS: Left chest wall pacemaker leads project over the right atrium and ventricle. Hyperinflated lungs. Increased bibasilar patchy opacities. No pleural effusion or pneumothorax. Similar postsurgical cardiomediastinal silhouette. No acute osseous abnormality. IMPRESSION: Hyperinflated lungs, which can be seen in the setting of COPD. Increased bibasilar patchy opacities, which may represent aspiration or pneumonia. Electronically Signed   By: Agustin Cree M.D.   On: 06/23/2023 10:17   DG Chest Portable 1 View Result Date: 06/22/2023 CLINICAL DATA:  Breathing problems EXAM: PORTABLE CHEST 1 VIEW COMPARISON:  07/04/2020 FINDINGS: Left-sided implanted cardiac device. The heart size and mediastinal contours are within normal limits. Prior TAVR. Aortic atherosclerosis. Right infrahilar interstitial opacity. There also mildly prominent interstitial markings at the left lung base. No pleural effusion or pneumothorax. The visualized skeletal structures are unremarkable. IMPRESSION: Right infrahilar interstitial opacity, which may represent atelectasis versus pneumonia.  Electronically Signed   By: Duanne Guess D.O.   On: 06/22/2023 09:38    Procedures .Critical Care  Performed by: Arletha Pili, DO Authorized by: Arletha Pili, DO   Critical care provider statement:    Critical care time (minutes):  33   Critical care was necessary to treat or prevent imminent or life-threatening deterioration of the following conditions:  Respiratory failure   Critical care was time spent personally by me on the following activities:  Development of treatment plan with patient or surrogate, discussions with consultants, evaluation of patient's response to treatment, examination of patient, ordering and review of laboratory studies, ordering and review of radiographic studies, ordering and performing treatments and interventions, pulse oximetry, re-evaluation of patient's condition and review of old charts     Medications Ordered in ED Medications  lactated ringers bolus 1,000 mL (has no administration in time range)  cefTRIAXone (  ROCEPHIN) 1 g in sodium chloride 0.9 % 100 mL IVPB (0 g Intravenous Stopped 06/23/23 0939)  azithromycin (ZITHROMAX) 500 mg in sodium chloride 0.9 % 250 mL IVPB (0 mg Intravenous Stopped 06/23/23 1001)  ipratropium-albuterol (DUONEB) 0.5-2.5 (3) MG/3ML nebulizer solution 3 mL (3 mLs Nebulization Given 06/23/23 1610)    ED Course/ Medical Decision Making/ A&P                                 Medical Decision Making 88 year old female who is here today for shortness of breath, cough, dyspnea.  Plan-patient diagnosed with pneumonia yesterday, has failed outpatient treatment.  Will provide her with broad-spectrum antibiotics in the emergency room.  She is currently on 2 L of oxygen.  Will also provide her DuoNeb as the patient does have a history of COPD which is likely not helping the situation.  This patient does not appear to have sepsis at this time.  Patient will require admission.  Reassessment 12:10 PM-my independent review the  patient's chest x-ray does show pneumonia.  Patient feeling a bit better after 2 DuoNebs and antibiotics.  She is on 2 L of oxygen.  Will admit patient to hospitalist for community-acquired pneumonia.  Amount and/or Complexity of Data Reviewed Labs: ordered. Radiology: ordered.  Risk Prescription drug management.           Final Clinical Impression(s) / ED Diagnoses Final diagnoses:  Community acquired pneumonia of right lower lobe of lung    Rx / DC Orders ED Discharge Orders     None         Anders Simmonds T, DO 06/23/23 1213

## 2023-06-23 NOTE — ED Notes (Signed)
 Patient had coughing fit with desaturations to 81% after [place] admin. Medication stopped and oxygen delivered via NRB with return to an SpO2 of 94%. Patient was then taken to room on floor once stable on 3L [place].

## 2023-06-23 NOTE — H&P (Signed)
 Date: 06/23/2023               Patient Name:  [name] MRN: [id number]  DOB: 11-Jun-1935 Age / Sex: 88 y.o., female   PCP: Sandre Kitty, MD              Medical Service: Internal Medicine Teaching Service              Attending Physician: Dr. Ninetta Lights Lacretia Leigh, MD    First Contact: Carmina Miller, DO    Second Contact: Marrianne Mood, MD    Third Contact Leonor Liv, MS          After Hours (After 5p/  First Contact    weekends / holidays): Second Contact     Chief Complaint: Shortness of breath and cough   History of Present Illness:  [name] is an 88 year old has a PMH of CHF, COPD, Pacemaker, aortic valve replacement, and HTN, presents after an acute episode of worsening dyspnea and cough. She reports that her symptoms initally began Sunday. She felt like she was struggling to breathe and the Sxs have continued to worsen. She came to the ED yesterday on 3/24 with similar symptoms, noted 1 to 2 days of increased coughing and shortness of breath at home. Increase sputum production white in color but thicker than usual. Called EMS given SOB, EMS noted her to be hypoxic 85% on room air and she is typically 92% at baseline. CXR was performed which found evidence of pneumonia, and she was discharged on Azithromycin 500mg  and Prednisone 50 mg daily.  Today she returns again with worsening symptoms, [name] was 85% on RA when EMS arrived. She woke up this morning in bed and felt like she could not breathe, and her cough had worsened, and it felt as if something was "stuck in her throat." She took Tylenol and then called EMS. She took her albuterol inhaler which helped. Reports she took 500 mg of Azithromycin last night which has not helped.   Of note, she began to "break out in a sweat" yesterday and has done so multiple times since. She also notes chills and a worsening wet cough with inability to produce sputum.   ED course 3/24: CXR 3/25: Right infrahilar interstitial opacity, which  may represent atelectasis versus pneumonia. ACS evaluation: EKG and troponin wnl  Resp panel: negative  ED course 3/25: Lactated ringers bolus  Cefrtiazone 1g and Azithyromicin 500mg  Duoneb 3ml  CXR 3/25: Hyperinflated lungs, which can be seen in the setting of COPD. Increased bibasilar patchy opacities, which may represent aspiration or pneumonia. Lactic acid wnl  CBC without leukocytosis Blood cultures pending   MH: PCP: Sandre Kitty, MD COPD: [name] is not on O2 at home, reports she takes Trelegy and Steroids as PRN. Her niece and friend who were in the room report she has has multiple episodes of URI lately, notably she had COVID 4 months ago. Her niece reports her O2 is low 90s at baseline though lately it has been dipping into the 80s on exertion.  CHF: [name] has a history of CHF and is currently taking furosemide. She is seen by cardiology outpatient, she has a pacemaker was implanted 7/20 for syncope and high grade heart block. Echo 12/2022 from showed mild stiffening of the heart with mild right ventricular dysfunction, 60-65% EF. RV systolic function reduce on Echo 10/2021  Aortic VR: s/p TAVR 9/20   She takes Aspirin 81 mg daily for stroke  prophylaxis, though denies prior history of stroke or MI.  Depression and anxiety: Takes hydroxyzine daily   Patient reports a past hospitalization for upper GI bleeding around 1 year ago, states an EGD was performed and it was found to be secondary to peptic ulcer disease. Records from 10/2020 show EGD was indicated for an acute duodenal ulcer with hemorrhage, findings were unremarkable.  10/2018 R heart cath showed decreased cardiac output.  She reports seasonal allergies but feels it is unrelated to her current condition.  FH: Reports brother had heart disease, unsure of specific condition.   SH:  Currently smokes 5 cigs per day.  30+ pack years  Lives alone with her dog, decently active   Review of Systems   Constitutional:  Positive for chills and diaphoresis. Negative for fever.  Respiratory:  Positive for cough, shortness of breath and wheezing. Negative for sputum production.   Cardiovascular:  Negative for chest pain, palpitations and leg swelling.  Gastrointestinal:  Negative for abdominal pain.  Genitourinary:  Negative for dysuria.  Musculoskeletal:  Positive for falls and neck pain.       Reports "Tightness" around the base of her throat. 1-2 falls in the past year without LOC  Skin:  Negative for rash.  Neurological:  Negative for loss of consciousness and headaches.  Psychiatric/Behavioral:  Positive for depression.     Meds: Current Facility-Administered Medications  Medication Dose Route Frequency Provider Last Rate Last Admin   arformoterol (BROVANA) nebulizer solution 15 mcg  15 mcg Nebulization BID Carmina Miller, DO       [START ON 06/24/2023] azithromycin (ZITHROMAX) tablet 500 mg  500 mg Oral Daily Carmina Miller, DO       budesonide (PULMICORT) nebulizer solution 0.25 mg  0.25 mg Nebulization BID Carmina Miller, DO       [START ON 06/24/2023] cefTRIAXone (ROCEPHIN) 1 g in sodium chloride 0.9 % 100 mL IVPB  1 g Intravenous Q24H Carmina Miller, DO       citalopram (CELEXA) tablet 20 mg  20 mg Oral Daily Carmina Miller, DO       ipratropium-albuterol (DUONEB) 0.5-2.5 (3) MG/3ML nebulizer solution 3 mL  3 mL Nebulization Q4H while awake Ginnie Smart, MD       predniSONE (DELTASONE) tablet 40 mg  40 mg Oral Q breakfast Carmina Miller, DO   40 mg at 06/23/23 1420   rivaroxaban (XARELTO) tablet 10 mg  10 mg Oral Daily Carmina Miller, DO        Allergies: Allergies as of 06/23/2023   (No Known Allergies)   Past Medical History:  Diagnosis Date   Anxiety    Stable   ASCUS on Pap smear 11/15/2009   Carotid artery disease (HCC)    1-39% RICA stenosis and 60-79% LICA stenosis on pre TAVR dopplers    Cataract    COPD (chronic obstructive pulmonary disease) (HCC)    Depression     Duodenal ulcer due to Helicobacter pylori 06/28/2020   Endometrial polyp 11/15/2009   Endometrioid adenocarcinoma    H/O varicella    Heart murmur    Hemorrhoids    History of measles, mumps, or rubella    Hypercholesterolemia    Hypertension    Menopausal symptoms 06/13/2010   Ovarian cyst 11/15/2009   PMB (postmenopausal bleeding) 11/15/2009   Presence of permanent cardiac pacemaker 10/20/2018   Vaginal bleeding    Past Surgical History:  Procedure Laterality Date   BIOPSY  06/19/2020   Procedure: BIOPSY;  Surgeon: Meridee Score,  Netty Starring., MD;  Location: [facility] ENDOSCOPY;  Service: Gastroenterology;;   BREAST SURGERY     Over twenty - five  years ago   CATARACT EXTRACTION W/ INTRAOCULAR LENS IMPLANT Bilateral    CERVICAL BIOPSY     ESOPHAGOGASTRODUODENOSCOPY (EGD) WITH PROPOFOL N/A 06/19/2020   Procedure: ESOPHAGOGASTRODUODENOSCOPY (EGD) WITH PROPOFOL;  Surgeon: Lemar Lofty., MD;  Location: [facility] ENDOSCOPY;  Service: Gastroenterology;  Laterality: N/A;   EYE SURGERY     INSERT / REPLACE / REMOVE PACEMAKER  10/20/2018   LAPAROSCOPIC ASSISTED VAGINAL HYSTERECTOMY  12/2009   BSO   MULTIPLE EXTRACTIONS WITH ALVEOLOPLASTY N/A 12/09/2018   Procedure: EXTRACTION OF TOOTH #'S 6,21-29 AND 32 WITH ALVEOLOPLASTY AND BILATERAL MANDIBULAR TORI REDUCTIONS;  Surgeon: Charlynne Pander, DDS;  Location: [facility] OR;  Service: Oral Surgery;  Laterality: N/A;   PACEMAKER IMPLANT N/A 10/20/2018   Procedure: PACEMAKER IMPLANT;  Surgeon: Duke Salvia, MD;  Location: [facility] INVASIVE CV LAB;  Service: Cardiovascular;  Laterality: N/A;   RIGHT/LEFT HEART CATH AND CORONARY ANGIOGRAPHY N/A 11/24/2018   Procedure: RIGHT/LEFT HEART CATH AND CORONARY ANGIOGRAPHY;  Surgeon: Kathleene Hazel, MD;  Location: [facility] INVASIVE CV LAB;  Service: Cardiovascular;  Laterality: N/A;   TEE WITHOUT CARDIOVERSION N/A 12/21/2018   Procedure: TRANSESOPHAGEAL ECHOCARDIOGRAM (TEE);  Surgeon: Kathleene Hazel, MD;   Location: [facility] OR;  Service: Open Heart Surgery;  Laterality: N/A;   Family History  Problem Relation Age of Onset   Heart failure Mother    Parkinson's disease Father    Lung cancer Brother    Heart disease Brother    Colon cancer Neg Hx    Colon polyps Neg Hx    Esophageal cancer Neg Hx      Physical Exam: Blood pressure (!) 121/44, pulse 85, temperature 97.9 F (36.6 C), temperature source Oral, resp. rate (!) 24, height 5\' 3"  (1.6 m), weight 48 kg, SpO2 99%. BP (!) 121/44 (BP Location: Right Arm)   Pulse 85   Temp 97.9 F (36.6 C) (Oral)   Resp (!) 24   Ht 5\' 3"  (1.6 m)   Wt 48 kg   SpO2 99%   BMI 18.75 kg/m   General: Alert and oriented, lying down in bed, nasal cannula, multiple EKG leads on abdomen HEENT: PERRL, conjunctiva/corneas clear, mucosa, and tongue normal, no hypoxia Back: Symmetric, no rashes or abnormal curvature  Lungs: Pursed lips when breathing, using accessory neck muscles to breathe, wheezes in all lung fields b/l Chest Wall:  No tenderness, pacemaker visible at right side of chest Cardiovascular: Difficult to obtain with wheezing and coughing of [name] Abdomen: Soft, non tender, normal bowel sounds  Extremities: b/l Extremities normal, no leg swelling, no cyanosis or edema Pulse: Strong R radial pulse  Neuro: 5/5 strength in b/l UE and LE   Lab results:     Latest Ref Rng & Units 06/23/2023   11:38 AM 06/23/2023    8:02 AM 06/22/2023    8:12 AM  CBC  WBC 4.0 - 10.5 K/uL  8.4  7.5   Hemoglobin 12.0 - 15.0 g/dL 16.1  09.6  04.5   Hematocrit 36.0 - 46.0 % 36.0  40.5  42.6   Platelets 150 - 400 K/uL  181  135     CMP     Component Value Date/Time   NA 138 06/23/2023 1138   NA 140 09/16/2022 1001   K 3.9 06/23/2023 1138   CL 102 06/23/2023 1138   CO2 22 06/23/2023 0845  GLUCOSE 120 (H) 06/23/2023 1138   BUN 19 06/23/2023 1138   BUN 20 09/16/2022 1001   CREATININE 1.00 06/23/2023 1138   CALCIUM 8.4 (L) 06/23/2023 0845   PROT 5.5 (L)  06/23/2023 0845   PROT 6.2 09/16/2022 1001   ALBUMIN 3.2 (L) 06/23/2023 0845   ALBUMIN 4.1 09/16/2022 1001   AST 58 (H) 06/23/2023 0845   ALT 32 06/23/2023 0845   ALKPHOS 59 06/23/2023 0845   BILITOT 0.7 06/23/2023 0845   BILITOT 0.4 09/16/2022 1001   EGFR 72 09/16/2022 1001   GFRNONAA >60 06/23/2023 0845   Lactic acid 1.5 > 1.8 VBG pending   Imaging results:  DG Chest Port 1 View Result Date: 06/23/2023 CLINICAL DATA:  Cough and shortness of breath.  Questionable sepsis EXAM: PORTABLE CHEST 1 VIEW COMPARISON:  Chest radiograph dated 06/22/2023 FINDINGS: Left chest wall pacemaker leads project over the right atrium and ventricle. Hyperinflated lungs. Increased bibasilar patchy opacities. No pleural effusion or pneumothorax. Similar postsurgical cardiomediastinal silhouette. No acute osseous abnormality. IMPRESSION: Hyperinflated lungs, which can be seen in the setting of COPD. Increased bibasilar patchy opacities, which may represent aspiration or pneumonia. Electronically Signed   By: Agustin Cree M.D.   On: 06/23/2023 10:17   DG Chest Portable 1 View Result Date: 06/22/2023 CLINICAL DATA:  Breathing problems EXAM: PORTABLE CHEST 1 VIEW COMPARISON:  07/04/2020 FINDINGS: Left-sided implanted cardiac device. The heart size and mediastinal contours are within normal limits. Prior TAVR. Aortic atherosclerosis. Right infrahilar interstitial opacity. There also mildly prominent interstitial markings at the left lung base. No pleural effusion or pneumothorax. The visualized skeletal structures are unremarkable. IMPRESSION: Right infrahilar interstitial opacity, which may represent atelectasis versus pneumonia. Electronically Signed   By: Duanne Guess D.O.   On: 06/22/2023 09:38    Other results: EKG: Atrial-sensed ventricular-paced rhythm  Assessment & Plan by Problem: Principal Problem:   Acute on chronic respiratory failure with hypoxia (HCC) Active Problems:   Complete heart block  (HCC)   Severe aortic valve stenosis   S/P TAVR (transcatheter aortic valve replacement)   HTN (hypertension)   HLD (hyperlipidemia)   COPD (chronic obstructive pulmonary disease) (HCC)   Generalized anxiety disorder   COPD exacerbation (HCC)   Community acquired pneumonia of right lower lobe of lung   Protein-calorie malnutrition, mild (HCC)  Carole Quintanilla is an 88 year old female with PMH of COPD, HFpEF, CHB s/p pacemaker, severe aortic stenosis s/p TAVR, HTN, GAD, and protein-calorie malnutrition and is presenting with an acute on chronic respiratory failure with hypoxia 2/2 COPD exacerbation  Acute on Chronic Respiratory Failure with Hypoxia and Hypercapnia 2/2 CAP Associated COPD Exacerbation  Tobacco Abuse With her SOB, productive cough, wheezing, pursed lips with use of accessory muscles as well as dropping O2 sat on [place], her presentation is consistent with an acute COPD exacerbation, similar to what she likely presented with yesterday. She reports chills with diaphoresis, and CXR findings of pneumonia on 3/25, therefore this exacerbation most likely has an infectious etiology. Will educate patient on benefits of discontinuing tobacco smoking, and ask about nicotine patch.   When RT saw ger see her, her O2 sat dropped to 81 on NRB, however this resolved soon after and her O2 returned to 95% on [place]. According to RT she cannot be on BIPAP due to her secretions. DuoNeb 2.5mg  q4h switched to xopenex due to high HR per RT.   Plan:  VBG to assess the levels of oxygen, carbon dioxide, and pH in the blood.  Nebulizers Ceftriaxone and Azithromycin q24 3 days Prednisone 40 mg daily 5 days Consider Nicotine replacement Continuous pulse ox  HFpEF chronic  Hx Complete heart block s/p pacemaker Her COPD exacerbation is complicated by her history of heart failure. This is less likely a heart failure exacerbation due to her normal volume status on exam and other symptoms as above more consistent  with COPD exacerbation.  Plan:  Hold Lasix 20mg  until discharge Tele mointoring  HTN BP Stable. Hold home Amlodipine  Protein calorie malnutrition, mild BMI 18.75. Concerns of her malnutrition worsening her overall illness.  [name]/OT recs  Plan: Nutrition consult  GAD Continue home Celexa   HLD Home Zocor  VTE prophylaxis: [name] refused Xarelto Diet: Normal IVF: None Code: Full  [name]/OT recommendations: pending TOC recommendations: pending Family Update: Misty Stanley, her niece  Discharge plan: Anticipating discharge within the next 1-2 days for now  This is a Psychologist, occupational Note.  The care of the patient was discussed with Dr. Annie Paras and the assessment and plan was formulated with their assistance.  Please see their note for official documentation of the patient encounter.   Signed: Leonor Liv, MS.  Attestation for Student Documentation:  I personally was present and re-performed the history, physical exam and medical decision-making activities of this service and have verified that the service and findings are accurately documented in the student's note.   This is a very pleasant 88 year old female with a history of COPD on Trelegy, ongoing tobacco use who presents with ACHRF 2/2 COPD exacerbation with DG chest showing possible bibasilar infiltrates. VBG is reassuring. She is not on O2 at home and has increased O2 requirements, cough, and wheezing on exam. On initial evaluation, she does appear to be using accessory muscles for breathing.  Will treat exacerbation with Pulmicort, Brovana, Duonebs (q 4), and steroids. Will cover suspected CAP with Rocephin and Zithromax.  She is currently feeling better after breathing treatments and O2 is currently stable.  Carmina Miller, DO 06/23/2023, 4:38 PM

## 2023-06-23 NOTE — Progress Notes (Incomplete)
 Pt showing dyspnea at rest and using accessory muscles to breath. RT called at 2031 for scheduled breathing treatment and assessment. Pt reassessed and still having dyspnea. RT called at 2149 for scheduled breathing treatment d

## 2023-06-23 NOTE — Progress Notes (Signed)
 Pt having worsening dyspnea at rest, expiratory and inspiratory wheezing and rhonchi throughout.  PRN albuterol administered, O2 dropped to the 80s.  MD notified, MD arrived to bedside to assess pt. N/O received for duonebs.  RT also made aware and is at bedside.  Pt's niece present and updated to POC.  Pt. Now more stable, O2 98% on 7L [place].

## 2023-06-23 NOTE — ED Triage Notes (Signed)
 PT BIB [facility] from home after experiencing SOB and weakness r/t pneumonia Dx from day prior ED visit. PT has Hx of COPD. Received 5mg  duoneb atrovent nebulizer en route. PT aox4. VSS.

## 2023-06-23 NOTE — ED Notes (Signed)
 Patient assisted to bedside commode. Daughter is at bedside.

## 2023-06-23 NOTE — Progress Notes (Signed)
 RT called to bedside due to pt desat. RN placed pt on a non rebreather but was placed back on [place] when RT entered the room. Pt has increased WOB and claims it was from the yuperli tx causing pt to go into coughing fit. Pt and niece at bedside are requesting to not have that breathing tx again. MD notified.

## 2023-06-24 ENCOUNTER — Observation Stay (HOSPITAL_COMMUNITY)

## 2023-06-24 DIAGNOSIS — I10 Essential (primary) hypertension: Secondary | ICD-10-CM | POA: Diagnosis not present

## 2023-06-24 DIAGNOSIS — J9621 Acute and chronic respiratory failure with hypoxia: Secondary | ICD-10-CM | POA: Diagnosis not present

## 2023-06-24 DIAGNOSIS — J9622 Acute and chronic respiratory failure with hypercapnia: Secondary | ICD-10-CM | POA: Diagnosis not present

## 2023-06-24 DIAGNOSIS — J984 Other disorders of lung: Secondary | ICD-10-CM | POA: Diagnosis not present

## 2023-06-24 DIAGNOSIS — Z950 Presence of cardiac pacemaker: Secondary | ICD-10-CM | POA: Diagnosis not present

## 2023-06-24 DIAGNOSIS — E46 Unspecified protein-calorie malnutrition: Secondary | ICD-10-CM | POA: Insufficient documentation

## 2023-06-24 DIAGNOSIS — M6281 Muscle weakness (generalized): Secondary | ICD-10-CM | POA: Diagnosis not present

## 2023-06-24 DIAGNOSIS — I700 Atherosclerosis of aorta: Secondary | ICD-10-CM | POA: Diagnosis not present

## 2023-06-24 DIAGNOSIS — F32A Depression, unspecified: Secondary | ICD-10-CM | POA: Diagnosis not present

## 2023-06-24 DIAGNOSIS — F411 Generalized anxiety disorder: Secondary | ICD-10-CM | POA: Diagnosis not present

## 2023-06-24 DIAGNOSIS — R918 Other nonspecific abnormal finding of lung field: Secondary | ICD-10-CM | POA: Diagnosis not present

## 2023-06-24 DIAGNOSIS — B9789 Other viral agents as the cause of diseases classified elsewhere: Secondary | ICD-10-CM | POA: Diagnosis not present

## 2023-06-24 DIAGNOSIS — Z8616 Personal history of COVID-19: Secondary | ICD-10-CM | POA: Diagnosis not present

## 2023-06-24 DIAGNOSIS — Z79899 Other long term (current) drug therapy: Secondary | ICD-10-CM | POA: Diagnosis not present

## 2023-06-24 DIAGNOSIS — E7800 Pure hypercholesterolemia, unspecified: Secondary | ICD-10-CM | POA: Diagnosis not present

## 2023-06-24 DIAGNOSIS — J440 Chronic obstructive pulmonary disease with acute lower respiratory infection: Secondary | ICD-10-CM | POA: Diagnosis not present

## 2023-06-24 DIAGNOSIS — F17210 Nicotine dependence, cigarettes, uncomplicated: Secondary | ICD-10-CM | POA: Diagnosis not present

## 2023-06-24 DIAGNOSIS — Z681 Body mass index (BMI) 19 or less, adult: Secondary | ICD-10-CM | POA: Diagnosis not present

## 2023-06-24 DIAGNOSIS — J206 Acute bronchitis due to rhinovirus: Secondary | ICD-10-CM | POA: Diagnosis not present

## 2023-06-24 DIAGNOSIS — I350 Nonrheumatic aortic (valve) stenosis: Secondary | ICD-10-CM | POA: Diagnosis not present

## 2023-06-24 DIAGNOSIS — J9601 Acute respiratory failure with hypoxia: Secondary | ICD-10-CM

## 2023-06-24 DIAGNOSIS — Z1152 Encounter for screening for COVID-19: Secondary | ICD-10-CM | POA: Diagnosis not present

## 2023-06-24 DIAGNOSIS — I442 Atrioventricular block, complete: Secondary | ICD-10-CM | POA: Diagnosis not present

## 2023-06-24 DIAGNOSIS — I5032 Chronic diastolic (congestive) heart failure: Secondary | ICD-10-CM | POA: Diagnosis not present

## 2023-06-24 DIAGNOSIS — E43 Unspecified severe protein-calorie malnutrition: Secondary | ICD-10-CM | POA: Diagnosis not present

## 2023-06-24 DIAGNOSIS — I110 Hypertensive heart disease with heart failure: Secondary | ICD-10-CM | POA: Diagnosis not present

## 2023-06-24 DIAGNOSIS — R2681 Unsteadiness on feet: Secondary | ICD-10-CM | POA: Diagnosis not present

## 2023-06-24 DIAGNOSIS — Z8249 Family history of ischemic heart disease and other diseases of the circulatory system: Secondary | ICD-10-CM | POA: Diagnosis not present

## 2023-06-24 DIAGNOSIS — I2510 Atherosclerotic heart disease of native coronary artery without angina pectoris: Secondary | ICD-10-CM | POA: Diagnosis not present

## 2023-06-24 DIAGNOSIS — R0602 Shortness of breath: Secondary | ICD-10-CM | POA: Diagnosis not present

## 2023-06-24 DIAGNOSIS — Z7401 Bed confinement status: Secondary | ICD-10-CM | POA: Diagnosis not present

## 2023-06-24 DIAGNOSIS — J441 Chronic obstructive pulmonary disease with (acute) exacerbation: Secondary | ICD-10-CM | POA: Diagnosis not present

## 2023-06-24 DIAGNOSIS — E8729 Other acidosis: Secondary | ICD-10-CM | POA: Diagnosis not present

## 2023-06-24 DIAGNOSIS — I959 Hypotension, unspecified: Secondary | ICD-10-CM | POA: Diagnosis not present

## 2023-06-24 DIAGNOSIS — J189 Pneumonia, unspecified organism: Secondary | ICD-10-CM | POA: Diagnosis not present

## 2023-06-24 LAB — MRSA NEXT GEN BY PCR, NASAL: MRSA by PCR Next Gen: NOT DETECTED

## 2023-06-24 LAB — CBC
HCT: 38 % (ref 36.0–46.0)
Hemoglobin: 12.5 g/dL (ref 12.0–15.0)
MCH: 29.9 pg (ref 26.0–34.0)
MCHC: 32.9 g/dL (ref 30.0–36.0)
MCV: 90.9 fL (ref 80.0–100.0)
Platelets: 148 10*3/uL — ABNORMAL LOW (ref 150–400)
RBC: 4.18 MIL/uL (ref 3.87–5.11)
RDW: 15.4 % (ref 11.5–15.5)
WBC: 8.5 10*3/uL (ref 4.0–10.5)
nRBC: 0 % (ref 0.0–0.2)

## 2023-06-24 LAB — RESPIRATORY PANEL BY PCR

## 2023-06-24 LAB — BASIC METABOLIC PANEL
Anion gap: 9 (ref 5–15)
BUN: 20 mg/dL (ref 8–23)
CO2: 24 mmol/L (ref 22–32)
Calcium: 9.1 mg/dL (ref 8.9–10.3)
Chloride: 103 mmol/L (ref 98–111)
Creatinine, Ser: 0.81 mg/dL (ref 0.44–1.00)
GFR, Estimated: >60 mL/min (ref >60)
Glucose, Bld: 98 mg/dL (ref 70–99)
Potassium: 4.3 mmol/L (ref 3.5–5.1)
Sodium: 136 mmol/L (ref 135–145)

## 2023-06-24 LAB — GLUCOSE, CAPILLARY
Glucose-Capillary: 109 mg/dL — ABNORMAL HIGH (ref 70–99)
Glucose-Capillary: 126 mg/dL — ABNORMAL HIGH (ref 70–99)

## 2023-06-24 LAB — STREP PNEUMONIAE URINARY ANTIGEN: Strep Pneumo Urinary Antigen: NEGATIVE

## 2023-06-24 LAB — BLOOD GAS, VENOUS
Acid-base deficit: 0.2 mmol/L (ref 0.0–2.0)
Bicarbonate: 29.5 mmol/L — ABNORMAL HIGH (ref 20.0–28.0)
O2 Saturation: 92 %
Patient temperature: 36.6
pCO2, Ven: 71 mmHg (ref 44–60)
pH, Ven: 7.23 — ABNORMAL LOW (ref 7.25–7.43)
pO2, Ven: 60 mmHg — ABNORMAL HIGH (ref 32–45)

## 2023-06-24 LAB — PROCALCITONIN: Procalcitonin: 0.13 ng/mL

## 2023-06-24 MED ORDER — ORAL CARE MOUTH RINSE: 15.0000 mL | OROMUCOSAL | Status: DC | PRN

## 2023-06-24 MED ORDER — STERILE WATER FOR INJECTION IJ SOLN
INTRAMUSCULAR | Status: AC
  Filled 2023-06-24: qty 10

## 2023-06-24 MED ORDER — CHLORHEXIDINE GLUCONATE CLOTH 2 % EX PADS
6.0000 | MEDICATED_PAD | Freq: Every day | CUTANEOUS | Status: DC
  Administered 2023-06-24 – 2023-07-03 (×9): 6 via TOPICAL

## 2023-06-24 MED ORDER — OLANZAPINE 10 MG IM SOLR
2.5000 mg | Freq: Once | INTRAMUSCULAR | Status: AC | PRN
  Administered 2023-06-24: 2.5 mg via INTRAMUSCULAR
  Filled 2023-06-24: qty 10

## 2023-06-24 MED ORDER — HALOPERIDOL LACTATE 5 MG/ML IJ SOLN: 2.0000 mg | Freq: Four times a day (QID) | INTRAMUSCULAR | Status: DC | PRN

## 2023-06-24 MED ORDER — ORAL CARE MOUTH RINSE
15.0000 mL | OROMUCOSAL | Status: DC
  Administered 2023-06-24 – 2023-07-02 (×28): 15 mL via OROMUCOSAL

## 2023-06-24 MED ORDER — POLYETHYLENE GLYCOL 3350 17 G PO PACK
17.0000 g | PACK | Freq: Every day | ORAL | Status: DC | PRN
  Administered 2023-06-29: 17 g via ORAL
  Filled 2023-06-24 (×2): qty 1

## 2023-06-24 MED ORDER — REVEFENACIN 175 MCG/3ML IN SOLN
175.0000 ug | Freq: Every day | RESPIRATORY_TRACT | Status: DC
  Administered 2023-06-24 – 2023-07-01 (×8): 175 ug via RESPIRATORY_TRACT
  Filled 2023-06-24 (×8): qty 3

## 2023-06-24 MED ORDER — METHYLPREDNISOLONE SODIUM SUCC 40 MG IJ SOLR
40.0000 mg | Freq: Two times a day (BID) | INTRAMUSCULAR | Status: DC
  Administered 2023-06-24 – 2023-06-25 (×2): 40 mg via INTRAVENOUS
  Filled 2023-06-24 (×2): qty 1

## 2023-06-24 MED ORDER — DOCUSATE SODIUM 100 MG PO CAPS
100.0000 mg | ORAL_CAPSULE | Freq: Two times a day (BID) | ORAL | Status: DC | PRN
  Administered 2023-06-29: 100 mg via ORAL
  Filled 2023-06-24: qty 1

## 2023-06-24 NOTE — Progress Notes (Signed)
 Interval history Overnight pt was in respiratory distress, O2 dropped in 80s, MEWs were yellow. She received Brovana and improved. She was stable on 4L [place].   Today in the room patient was struggling to breath and felt tired from just talking, history was limited due to dyspnea. She has some new chest tightness and pain in her abdomen overnight with increased sweating. Denies this pain radiating anywhere. Her daughter states every 30 mins to an hour her O2 has dropped into the 80s, and has worsening DOE. She has never used BiPAP at home. In the room her [place] was increased to 7.5L and she continued c/o of dyspnea, O2 was low 90s. She still has a productive cough though only productive of a small amount of white sputum. She is agreeable to Xarelto.   On telemetry, her pacemaker showed Sinus Tachycardia overnight.  PT was unable to assess pt due to current status.   Day 2/3 Azithromycin 500mg  Day 2/5 Ceftriaxone IV  Day 2/5 Prednisone 40mg  daily    Physical exam Blood pressure (!) 137/58, pulse 88, temperature 97.8 F (36.6 C), temperature source Oral, resp. rate (!) 22, height 5\' 3"  (1.6 m), weight 48 kg, SpO2 97%.  General: Alert and oriented, lying down in bed,  HEENT: conjunctiva/corneas clear, mucosa, and tongue normal Resp: Pursed lips when breathing, using accessory neck muscles to breathe, wheezes and ronchi in all lung fields b/l  Cardiovascular: tachycardic, regular rhythm, no LEE Abdomen: Soft, non tender, normal bowel sounds  Extremities: b/l Extremities normal, no leg swelling, no cyanosis or edema, strong dorsalis pedis pulses Neuro: 5/5 strength in b/l UE and LE    Weight change:    Intake/Output Summary (Last 24 hours) at 06/24/2023 1337 Last data filed at 06/24/2023 1610 Gross per 24 hour  Intake 251.88 ml  Output --  Net 251.88 ml   Net IO Since Admission: 251.88 mL [06/24/23 1337]  Labs, images, and other studies    Latest Ref Rng & Units  06/24/2023    6:09 AM 06/23/2023   11:38 AM 06/23/2023    8:02 AM  CBC  WBC 4.0 - 10.5 K/uL 8.5   8.4   Hemoglobin 12.0 - 15.0 g/dL 96.0  45.4  09.8   Hematocrit 36.0 - 46.0 % 38.0  36.0  40.5   Platelets 150 - 400 K/uL 148   181         Latest Ref Rng & Units 06/24/2023    6:09 AM 06/23/2023   11:38 AM 06/23/2023    8:45 AM  BMP  Glucose 70 - 99 mg/dL 98  119  147   BUN 8 - 23 mg/dL 20  19  18    Creatinine 0.44 - 1.00 mg/dL 8.29  5.62  1.30   Sodium 135 - 145 mmol/L 136  138  138   Potassium 3.5 - 5.1 mmol/L 4.3  3.9  3.5   Chloride 98 - 111 mmol/L 103  102  105   CO2 22 - 32 mmol/L 24   22   Calcium 8.9 - 10.3 mg/dL 9.1   8.4    VBG 8/65 > 3/26:  pH: 7.31 > 7.23 pCO2 61 > 71 Bicarbonate 30.5 > 29.5    Repeat CXR official results pending Sputum studies - 20 pathogen panel, procalcitonin, urine legionella and strep     Assessment and plan Hospital day 2  [name]  is a 88 y.o.  with PMH of COPD, HFpEF, CHB s/p pacemaker, severe aortic stenosis s/p TAVR, HTN, GAD, and protein-calorie malnutrition and is presenting with an acute on chronic respiratory failure with hypoxia 2/2 COPD exacerbation   Principal Problem:   Acute on chronic respiratory failure with hypoxia (HCC) Active Problems:   Complete heart block (HCC)   Severe aortic valve stenosis   S/P TAVR (transcatheter aortic valve replacement)   HTN (hypertension)   HLD (hyperlipidemia)   COPD (chronic obstructive pulmonary disease) (HCC)   Generalized anxiety disorder   COPD exacerbation (HCC)   Community acquired pneumonia of right lower lobe of lung   Protein-calorie malnutrition, mild (HCC)   Protein-calorie malnutrition, severe   Acute on Chronic Respiratory Failure with Hypoxia and Hypercapnia 2/2 CAP Associated COPD Exacerbation  Tobacco Abuse Worsening, upon exam today she continues to be in respiratory distress and was requiring nasal cannula up to 7L . VBG today shows increased hypercapnia 61 >  71. Based on her worsening status she will be transferred to the ICU today and initiated on BIPAP. Appreciated RT and PCCM suggestions.    Concern about her chest tightness for MI, though she denies radiation of pain and tele showed no evidence of ST elevations based on exam as well. Suspecting her new chest pain is 2/2 use of accessory muscles in abdomen and neck. Her presentation remains consistent with an acute COPD exacerbation with infectious etiology based on CXR. When she is more stable, would like her to be educated on benefits of discontinuing tobacco smoking, and ask about nicotine patch.    Plan:  Continue Brovana, Pulmicort, Yupelri  Duonebs 3ml q4h, can switch to Xopenex if she becomes too tachycardic  IV Ceftriaxone 1 g q 24 (Day 2/5) Azithromycin 500 mg q 24 (Day 2/3) Prednisone 40 mg (Day 2/5) Pending 20 pathogen panel, procalcitonin, urine legionella and strep. Sputum culture Mucinex PRN    HFpEF chronic  Hx Complete heart block s/p pacemaker Her COPD exacerbation is complicated by her history of heart failure. This is less likely a heart failure exacerbation due to her normal volume status on exam and other symptoms as above more consistent with COPD exacerbation. Volume status on exam looks ok. Overnight on tele she was in Sinus Tachycardia.   Plan:  Holding home lasix given euvolemia Tele monitoring  [place] I/Os Daily weights    HTN BP elevated, likely due to respiratory distress. Consider resuming home antihypertensives if BP continues to increase.  Protein calorie malnutrition, mild BMI 18.75. Severe malnutrition in context of chronic illness. Nutritionist has diagnosed her with Severe Malnutrition and recommends a magic cup TID with meals and a trial of Ensure PO BID.   GAD Pt complaining of increased anxiety, will potentially start Hydrazine if anxiety is refractory after her breathing treatment improves.  Continue home Celexa  HLD Continue home Zocor    VTE prophylaxis: DOAC Diet: Normal IVF: None Code: Full  PT/OT recommendations: pending, patient currently not stable for evaluation TOC recommendations: pending Family Update: [name], her niece   Discharge plan: ICU transfer    This is a Psychologist, occupational Note.  The care of the patient was discussed with Dr. Annie Paras and the assessment and plan was formulated with their assistance.  Please see their note for official documentation of the patient encounter.    Signed: Leonor Liv, MS  Attestation for Student Documentation:  I personally was present and re-performed the history, physical exam and medical decision-making activities of  this service and have verified that the service and findings are accurately documented in the student's note.  Worsening respiratory status with increased hypercapnia.  Agree with ICU transfer for BiPAP and further management.  Awaiting several additional studies/repeat imaging that was ordered today.  Appreciate PCCM recommendations.  IMTS will sign off for now pending improvement in symptoms.  Carmina Miller, DO 06/24/2023, 2:15 PM

## 2023-06-24 NOTE — Progress Notes (Addendum)
 Initial Nutrition Assessment  DOCUMENTATION CODES:   Severe malnutrition in context of chronic illness  INTERVENTION:  Magic cup TID with meals, each supplement provides 290 kcal and 9 grams of protein  Order trail of Ensure Enlive po BID, each supplement provides 350 kcal and 20 grams of protein.  NUTRITION DIAGNOSIS:   Severe Malnutrition related to chronic illness as evidenced by severe muscle depletion, severe fat depletion.  GOAL:   Patient will meet greater than or equal to 90% of their needs  MONITOR:   PO intake, Supplement acceptance  REASON FOR ASSESSMENT:   Consult Assessment of nutrition requirement/status  ASSESSMENT:   PMH of CHF, COPD, Pacemaker, aortic valve replacement, ongoing tobacco use and HTN admitted for COPD exacerbation.  RD met with pt and pt's niece at bedside. Pt's niece reports previous to pt being sick, pt lived a very active lifestyle consisting of yard work and house projects. Pt reports UBW to be 105-106#. Pt's niece reports appetite to be low. Ginger ale and snacks observed at bedside. Pt drinks vanilla ensure at home and likes vanilla ice cream so is willing to try vanilla Magic Cup. Niece reports frequently encouraging po intake and has a room service menu at bedside. Pt does not take a MVI and declined MVI at this time.    Intake/Output Summary (Last 24 hours) at 06/24/2023 1125 Last data filed at 06/24/2023 8657 Gross per 24 hour  Intake 251.88 ml  Output --  Net 251.88 ml    Weights reviewed. No wt loss in past year per chart hx.   Medications reviewed. Labs reviewed.   NUTRITION - FOCUSED PHYSICAL EXAM:  Flowsheet Row Most Recent Value  Orbital Region Moderate depletion  Upper Arm Region Severe depletion  Thoracic and Lumbar Region Severe depletion  Buccal Region Severe depletion  Temple Region Severe depletion  Clavicle Bone Region Severe depletion  Clavicle and Acromion Bone Region Severe depletion  Scapular Bone Region  Severe depletion  Dorsal Hand Moderate depletion  Patellar Region Severe depletion  Anterior Thigh Region Severe depletion  Posterior Calf Region Moderate depletion  Edema (RD Assessment) None  Hair Reviewed  Eyes Reviewed  Mouth Reviewed  Skin Reviewed  Nails Reviewed       Diet Order:   Diet Order             Diet regular Room service appropriate? Yes; Fluid consistency: Thin  Diet effective now                   EDUCATION NEEDS:   Education needs have been addressed  Skin:  Skin Assessment: Reviewed RN Assessment  Last BM:  3/26  Height:   Ht Readings from Last 1 Encounters:  06/23/23 5\' 3"  (1.6 m)    Weight:   Wt Readings from Last 1 Encounters:  06/23/23 48 kg    Ideal Body Weight:  52.3 kg  BMI:  Body mass index is 18.75 kg/m.  Estimated Nutritional Needs:   Kcal:  1300-1500 kcal  Protein:  70-90 g  Fluid:  1 mL/kcal or per MD  Kathrynn Speed, MPH, RD, LDN Clinical Dietitian Contact information can be found at [facility].

## 2023-06-24 NOTE — Consult Note (Signed)
 NAME:  [name], MRN:  [id number], DOB:  1935-12-05, LOS: 0 ADMISSION DATE:  06/23/2023, CONSULTATION DATE:  3/26 REFERRING MD:  Danise Edge CHIEF COMPLAINT:  respiratory distress    History of Present Illness:  88 year old female with past medical history of hypertension, hyperlipidemia, CAD, AS s/p TAVR, PPM, COPD not on home O2 who presented to the ED on 3/25 with shortness of breath. Symptoms beginning on Sunday 3/23 with cough, increased sputum production, dyspnea. She presented to the ED initially on 3/24 with shortness of breath. She was found to have pneumonia and was discharged on azithromycin and prednisone. She had ongoing, worsening symptoms which is why she returned. O2 sat 85% on room air, diaphoretic with complaint of chills at home. She was given LR, rocephin/azithromycin, duoneb and admitted to IMTS. On chart review, appears yesterday she had desaturation requiring RT to place her on NRB. They were called 2 more times to bedside for additional breathing treatments related to her dyspnea. PCCM consulted this morning for increasing oxygen requirement up to [facility], increasing anxiety and worsened shortness of breath.   Pertinent  Medical History  hypertension, hyperlipidemia, CAD, AS s/p TAVR, PPM, COPD not on home O2  Significant Hospital Events: Including procedures, antibiotic start and stop dates in addition to other pertinent events   3/25: admit for AECOPD, pneumonia. Antibiotics. Multiple treatments.  3/26: worsening dyspnea, increasing O2, ccm consult   Interim History / Subjective:  Dyspneic on exam. She appears tired. She is using accessory muscles, pursed lip breathing. She is able to talk in short sentences and is oriented. She feels short of breath without having chest pain or palpitations.   Objective   Blood pressure (!) 171/60, pulse 96, temperature 97.8 F (36.6 C), temperature source Oral, resp. rate (!) 24, height 5\' 3"  (1.6 m), weight 48 kg, SpO2 90%.         Intake/Output Summary (Last 24 hours) at 06/24/2023 1106 Last data filed at 06/24/2023 4098 Gross per 24 hour  Intake 251.88 ml  Output --  Net 251.88 ml   Filed Weights   06/23/23 0803  Weight: 48 kg    Examination: General: elderly, frail and thin, moderate resp distress  HENT: ncat, anicteric sclera, mucous membranes dry, nasal cannula  Lungs: wheezing and rhonchi bilaterally, prolonged expiration, abdominal accessory muscle use, productive/wet cough, 7L HFNC Cardiovascular: s1s2, rrr Abdomen: flat, soft  Extremities: no edema Neuro: drowsy, but able to speak in few word sentences at at time, follows commands GU: no foley  Resolved Hospital Problem list    Assessment & Plan:  Acute exacerbation of COPD  Possible pneumonia, lower suspicion  Presented with about 3 days of cough, increased sputum production and worsening dyspnea. Started on azithromycin and prednisone. Worsened and returned. Admitted. Put on rocephin/azithro, steroids, nebs. 3/26 with increased oxygen requirements and WOB.  - transfer to ICU for BiPAP and airway monitoring - she is tired  - f/u V[facility] for pCO2 eval  - f/u 20 pathogen panel, procalcitonin, urine legionella and strep  - f/u sputum culture  - +brovana, pulmicort, yupelri  - + scheduled and prn duonebs  - can switch to xopenex if she becomes too tachycardic  - flutter valve, IS, chest PT  - con't tele monitoring  - she wants intubation if it comes to that   Hyperlipidemia  - con't simvastatin 20mg  daily   Hypertension  Takes amlodipine, lasix at home  - hold amlodipine for now - - prn if needed  Aortic stenosis s/p TAVR on Xarelto  CAD  - con't Xarelto  - continuous tele-monitoring  - ensure OP f/u on discharge  Best Practice (right click and "Reselect all SmartList Selections" daily)   Diet/type: NPO DVT prophylaxis: DOAC Pressure ulcer(s): na GI prophylaxis: na Lines: na Foley: na Code Status: full code Last date of  multidisciplinary goals of care discussion [3/26]  Labs   CBC: Recent Labs  Lab 06/22/23 0812 06/23/23 0802 06/23/23 1138 06/24/23 0609  WBC 7.5 8.4  --  8.5  NEUTROABS 4.8 6.7  --   --   HGB 13.5 13.5 12.2 12.5  HCT 42.6 40.5 36.0 38.0  MCV 95.7 90.8  --  90.9  PLT 135* 181  --  148*    Basic Metabolic Panel: Recent Labs  Lab 06/22/23 0812 06/23/23 0845 06/23/23 1138 06/24/23 0609  NA 139 138 138 136  K 3.8 3.5 3.9 4.3  CL 106 105 102 103  CO2 23 22  --  24  GLUCOSE 105* 115* 120* 98  BUN 12 18 19 20   CREATININE 0.82 0.88 1.00 0.81  CALCIUM 8.8* 8.4*  --  9.1   GFR: Estimated Creatinine Clearance: 38.5 mL/min (by C-G formula based on SCr of 0.81 mg/dL). Recent Labs  Lab 06/22/23 0812 06/23/23 0802 06/23/23 0923 06/23/23 0951 06/24/23 0609  WBC 7.5 8.4  --   --  8.5  LATICACIDVEN  --   --  1.5 1.8  --     Liver Function Tests: Recent Labs  Lab 06/22/23 0812 06/23/23 0845  AST 55* 58*  ALT 26 32  ALKPHOS 70 59  BILITOT 0.9 0.7  PROT 6.0* 5.5*  ALBUMIN 3.6 3.2*   No results for input(s): "LIPASE", "AMYLASE" in the last 168 hours. No results for input(s): "AMMONIA" in the last 168 hours.  ABG    Component Value Date/Time   PHART 7.422 12/17/2018 1415   PCO2ART 38.0 12/17/2018 1415   PO2ART 94.9 12/17/2018 1415   HCO3 30.5 (H) 06/23/2023 1512   TCO2 25 06/23/2023 1138   ACIDBASEDEF 1.0 11/24/2018 0848   O2SAT 52.9 06/23/2023 1512     Coagulation Profile: No results for input(s): "INR", "PROTIME" in the last 168 hours.  Cardiac Enzymes: No results for input(s): "CKTOTAL", "CKMB", "CKMBINDEX", "TROPONINI" in the last 168 hours.  HbA1C: Hgb A1c MFr Bld  Date/Time Value Ref Range Status  09/16/2022 10:01 AM 5.5 4.8 - 5.6 % Final    Comment:             Prediabetes: 5.7 - 6.4          Diabetes: >6.4          Glycemic control for adults with diabetes: <7.0   09/02/2021 09:01 AM 5.3 4.8 - 5.6 % Final    Comment:             Prediabetes:  5.7 - 6.4          Diabetes: >6.4          Glycemic control for adults with diabetes: <7.0     CBG: No results for input(s): "GLUCAP" in the last 168 hours.  Review of Systems:   As above  Past Medical History:  She,  has a past medical history of Anxiety, ASCUS on Pap smear (11/15/2009), Carotid artery disease (HCC), Cataract, COPD (chronic obstructive pulmonary disease) (HCC), Depression, Duodenal ulcer due to Helicobacter pylori (06/28/2020), Endometrial polyp (11/15/2009), Endometrioid adenocarcinoma, H/O varicella, Heart murmur, Hemorrhoids, History of measles,  mumps, or rubella, Hypercholesterolemia, Hypertension, Menopausal symptoms (06/13/2010), Ovarian cyst (11/15/2009), PMB (postmenopausal bleeding) (11/15/2009), Presence of permanent cardiac pacemaker (10/20/2018), and Vaginal bleeding.   Surgical History:   Past Surgical History:  Procedure Laterality Date   BIOPSY  06/19/2020   Procedure: BIOPSY;  Surgeon: Mansouraty, Netty Starring., MD;  Location: [facility] ENDOSCOPY;  Service: Gastroenterology;;   BREAST SURGERY     Over twenty - five  years ago   CATARACT EXTRACTION W/ INTRAOCULAR LENS IMPLANT Bilateral    CERVICAL BIOPSY     ESOPHAGOGASTRODUODENOSCOPY (EGD) WITH PROPOFOL N/A 06/19/2020   Procedure: ESOPHAGOGASTRODUODENOSCOPY (EGD) WITH PROPOFOL;  Surgeon: Lemar Lofty., MD;  Location: [facility] ENDOSCOPY;  Service: Gastroenterology;  Laterality: N/A;   EYE SURGERY     INSERT / REPLACE / REMOVE PACEMAKER  10/20/2018   LAPAROSCOPIC ASSISTED VAGINAL HYSTERECTOMY  12/2009   BSO   MULTIPLE EXTRACTIONS WITH ALVEOLOPLASTY N/A 12/09/2018   Procedure: EXTRACTION OF TOOTH #'S 6,21-29 AND 32 WITH ALVEOLOPLASTY AND BILATERAL MANDIBULAR TORI REDUCTIONS;  Surgeon: Charlynne Pander, DDS;  Location: [facility] OR;  Service: Oral Surgery;  Laterality: N/A;   PACEMAKER IMPLANT N/A 10/20/2018   Procedure: PACEMAKER IMPLANT;  Surgeon: Duke Salvia, MD;  Location: [facility] INVASIVE CV LAB;  Service:  Cardiovascular;  Laterality: N/A;   RIGHT/LEFT HEART CATH AND CORONARY ANGIOGRAPHY N/A 11/24/2018   Procedure: RIGHT/LEFT HEART CATH AND CORONARY ANGIOGRAPHY;  Surgeon: Kathleene Hazel, MD;  Location: [facility] INVASIVE CV LAB;  Service: Cardiovascular;  Laterality: N/A;   TEE WITHOUT CARDIOVERSION N/A 12/21/2018   Procedure: TRANSESOPHAGEAL ECHOCARDIOGRAM (TEE);  Surgeon: Kathleene Hazel, MD;  Location: [facility] OR;  Service: Open Heart Surgery;  Laterality: N/A;     Social History:   reports that she has been smoking cigarettes. She has a 13.8 pack-year smoking history. She has never used smokeless tobacco. She reports that she does not currently use drugs. She reports that she does not drink alcohol.   Family History:  Her family history includes Heart disease in her brother; Heart failure in her mother; Lung cancer in her brother; Parkinson's disease in her father. There is no history of Colon cancer, Colon polyps, or Esophageal cancer.   Allergies No Known Allergies   Home Medications  Prior to Admission medications   Medication Sig Start Date End Date Taking? Authorizing Provider  acetaminophen (TYLENOL) 325 MG tablet Take 2 tablets (650 mg total) by mouth every 6 (six) hours as needed for moderate pain. 07/26/21   Wallis Bamberg, PA-C  albuterol (VENTOLIN HFA) 108 (90 Base) MCG/ACT inhaler INHALE 2 PUFFS BY MOUTH EVERY 4 HOURS AS NEEDED FOR WHEEZING FOR SHORTNESS OF BREATH Patient taking differently: Inhale 2 puffs into the lungs every 4 (four) hours as needed for wheezing or shortness of breath. 03/23/23   Sandre Kitty, MD  amLODipine (NORVASC) 10 MG tablet TAKE 1 TABLET BY MOUTH ONCE DAILY . APPOINTMENT REQUIRED FOR FUTURE REFILLS WITH  CARDIOLOGIST  IN  OCTOBER  2024,  FINAL  ATTEMPT Patient taking differently: Take 10 mg by mouth daily. 02/16/23   Kathleene Hazel, MD  aspirin 81 MG chewable tablet Chew 1 tablet (81 mg total) by mouth daily. 12/23/18   Barrett, Erin R, PA-C   azithromycin (ZITHROMAX) 500 MG tablet Take 1 tablet (500 mg total) by mouth daily for 5 days. 06/22/23 06/27/23  Royanne Foots, DO  carboxymethylcellulose (REFRESH PLUS) 0.5 % SOLN Place 1 drop into both eyes daily as needed (dry eyes).    [provider]  Cholecalciferol (VITAMIN D-3) 125 MCG (5000 UT) TABS Take 5,000 Units by mouth daily.    [provider]  citalopram (CELEXA) 40 MG tablet Take 1/2 (one-half) tablet by mouth once daily Patient taking differently: Take 20 mg by mouth daily. 02/16/23   Sandre Kitty, MD  furosemide (LASIX) 20 MG tablet Take 1 tablet (20 mg total) by mouth daily. 02/02/23   Kathleene Hazel, MD  halobetasol (ULTRAVATE) 0.05 % cream Apply topically 2 (two) times daily. 04/24/23   Sandre Kitty, MD  hydrOXYzine (VISTARIL) 50 MG capsule Take 1 capsule (50 mg total) by mouth 3 (three) times daily as needed for anxiety. 01/22/23   Sandre Kitty, MD  Omega-3 Fatty Acids (FISH OIL) 1000 MG CAPS Take 1,000 mg by mouth daily.    [provider]  pantoprazole (PROTONIX) 20 MG tablet Take 1 tablet by mouth once daily 04/27/23   Iva Boop, MD  predniSONE (DELTASONE) 50 MG tablet Take 1 tablet (50 mg total) by mouth daily with breakfast for 4 days. 06/22/23 06/26/23  Royanne Foots, DO  simvastatin (ZOCOR) 20 MG tablet Take 1 tablet by mouth once daily 04/27/23   Sandre Kitty, MD  TRELEGY ELLIPTA 100-62.5-25 MCG/ACT AEPB Inhale 1 puff into the lungs daily. 06/11/23   [provider]  umeclidinium-vilanterol (ANORO ELLIPTA) 62.5-25 MCG/ACT AEPB Inhale 1 puff into the lungs daily. 06/11/23   Glenford Bayley, NP  vitamin B-12 (CYANOCOBALAMIN) 500 MCG tablet Take 500 mcg by mouth daily.    [provider]  vitamin E 180 MG (400 UNITS) capsule Take 400 Units by mouth daily.    [provider]     Critical care time: 61    Lenard Galloway [facility] 06/24/23 11:15  AM  Please see [email or web address] for pager details.  From 7A-7P if no response, please call [phone] After hours, please call ELink [phone]

## 2023-06-24 NOTE — Progress Notes (Signed)
 PT Cancellation Note  Patient Details Name: [name] MRN: [id number] DOB: 1935/09/30   Cancelled Treatment:    Reason Eval/Treat Not Completed: Medical issues which prohibited therapy (Per RN, pt with difficulty breathing and waiting for respiratory treatment. Acute PT to follow and re-attempt as able.)  Hilton Cork, PT, DPT Secure Chat Preferred  Rehab Office [phone]   Arturo Morton [facility] 06/24/2023, 8:54 AM

## 2023-06-24 NOTE — Progress Notes (Signed)
 Pt transported from 2W12 to 3M06 with RRT, RN and Rapid RN without complication. Pt initiated on bipap upon arrival to ICU and report given to RRT.

## 2023-06-24 NOTE — Plan of Care (Signed)

## 2023-06-24 NOTE — Care Management Obs Status (Signed)
 MEDICARE OBSERVATION STATUS NOTIFICATION   Patient Details  Name: [name] MRN: [id number] Date of Birth: 1935/11/23   Medicare Observation Status Notification Given:  Yes    Lawerance Sabal, RN 06/24/2023, 10:08 AM

## 2023-06-25 DIAGNOSIS — J9622 Acute and chronic respiratory failure with hypercapnia: Secondary | ICD-10-CM | POA: Diagnosis not present

## 2023-06-25 DIAGNOSIS — J9601 Acute respiratory failure with hypoxia: Secondary | ICD-10-CM | POA: Diagnosis not present

## 2023-06-25 LAB — LEGIONELLA PNEUMOPHILA SEROGP 1 UR AG: L. pneumophila Serogp 1 Ur Ag: NEGATIVE

## 2023-06-25 LAB — GLUCOSE, CAPILLARY: Glucose-Capillary: 115 mg/dL — ABNORMAL HIGH (ref 70–99)

## 2023-06-25 MED ORDER — ASPIRIN 81 MG PO TBEC
81.0000 mg | DELAYED_RELEASE_TABLET | Freq: Every day | ORAL | Status: DC
  Administered 2023-06-25 – 2023-07-03 (×9): 81 mg via ORAL
  Filled 2023-06-25 (×9): qty 1

## 2023-06-25 MED ORDER — DEXTROMETHORPHAN POLISTIREX ER 30 MG/5ML PO SUER
30.0000 mg | Freq: Three times a day (TID) | ORAL | Status: DC | PRN
  Administered 2023-06-25 – 2023-07-02 (×3): 30 mg via ORAL
  Filled 2023-06-25 (×5): qty 5

## 2023-06-25 MED ORDER — METHYLPREDNISOLONE SODIUM SUCC 40 MG IJ SOLR
40.0000 mg | Freq: Every day | INTRAMUSCULAR | Status: AC
  Administered 2023-06-26 – 2023-06-29 (×4): 40 mg via INTRAVENOUS
  Filled 2023-06-25 (×4): qty 1

## 2023-06-25 MED ORDER — HYDROXYZINE HCL 25 MG PO TABS
50.0000 mg | ORAL_TABLET | Freq: Three times a day (TID) | ORAL | Status: DC | PRN
  Administered 2023-06-25 – 2023-07-02 (×9): 50 mg via ORAL
  Filled 2023-06-25 (×10): qty 2

## 2023-06-25 MED ORDER — GUAIFENESIN ER 600 MG PO TB12
1200.0000 mg | ORAL_TABLET | Freq: Two times a day (BID) | ORAL | Status: DC
  Administered 2023-06-25 – 2023-07-03 (×16): 1200 mg via ORAL
  Filled 2023-06-25 (×16): qty 2

## 2023-06-25 MED ORDER — ONDANSETRON HCL 4 MG/2ML IJ SOLN
4.0000 mg | Freq: Four times a day (QID) | INTRAMUSCULAR | Status: AC | PRN
  Administered 2023-06-26 – 2023-06-28 (×2): 4 mg via INTRAVENOUS
  Filled 2023-06-25 (×2): qty 2

## 2023-06-25 NOTE — Consult Note (Signed)
 NAME:  [name], MRN:  [id number], DOB:  05/23/1935, LOS: 1 ADMISSION DATE:  06/23/2023, CONSULTATION DATE:  3/26 REFERRING MD:  Danise Edge CHIEF COMPLAINT:  respiratory distress    History of Present Illness:  88 year old female with past medical history of hypertension, hyperlipidemia, CAD, AS s/p TAVR, PPM, COPD not on home O2 who presented to the ED on 3/25 with shortness of breath. Symptoms beginning on Sunday 3/23 with cough, increased sputum production, dyspnea. She presented to the ED initially on 3/24 with shortness of breath. She was found to have pneumonia and was discharged on azithromycin and prednisone. She had ongoing, worsening symptoms which is why she returned. O2 sat 85% on room air, diaphoretic with complaint of chills at home. She was given LR, rocephin/azithromycin, duoneb and admitted to IMTS. On chart review, appears yesterday she had desaturation requiring RT to place her on NRB. They were called 2 more times to bedside for additional breathing treatments related to her dyspnea. PCCM consulted this morning for increasing oxygen requirement up to [facility], increasing anxiety and worsened shortness of breath.   Pertinent  Medical History  hypertension, hyperlipidemia, CAD, AS s/p TAVR, PPM, COPD not on home O2  Significant Hospital Events: Including procedures, antibiotic start and stop dates in addition to other pertinent events   3/25: admit for AECOPD, pneumonia. Antibiotics. Multiple treatments.  3/26: worsening dyspnea, increasing O2, ccm consult   Interim History / Subjective:  Dyspneic on exam. She appears tired. She is using accessory muscles, pursed lip breathing. She is able to talk in short sentences and is oriented. She feels short of breath without having chest pain or palpitations.   Objective   Blood pressure 119/78, pulse (!) 105, temperature 98 F (36.7 C), temperature source Axillary, resp. rate 18, height 5\' 3"  (1.6 m), weight 48.6 kg, SpO2 100%.    Vent  Mode: PCV;BIPAP FiO2 (%):  [50 %] 50 % Set Rate:  [20 bmp] 20 bmp PEEP:  [5 cmH20] 5 cmH20   Intake/Output Summary (Last 24 hours) at 06/25/2023 0920 Last data filed at 06/25/2023 0630 Gross per 24 hour  Intake 134.3 ml  Output 200 ml  Net -65.7 ml   Filed Weights   06/23/23 0803 06/24/23 1215  Weight: 48 kg 48.6 kg    Examination: General: elderly, frail and thin, moderate resp distress  HENT: ncat, anicteric sclera, mucous membranes dry, nasal cannula  Lungs: wheezing and rhonchi bilaterally, prolonged expiration, no accessory muscle use but tachypneic,  Cardiovascular: s1s2, rrr Abdomen: flat, soft  Extremities: no edema Neuro: alert and oriented GU: no foley  Resolved Hospital Problem list    Assessment & Plan:  Acute on chronic hypercarbic respiratory failure and acute hypoxemic respiratory failure due to COPD exacerbation related to presumed community-acquired pneumonia and rhinovirus.  With bilateral infiltrates. -- Decrease steroids to 40 mg Solumedrol IV daily (increased anxiety) -- Continue CAP coverage, Legionella sent,strep antigen negative -- Continue Yupelri, budesonide, arformoterol, scheduled and as needed DuoNebs -- Trial of BiPAP, [place] as tolerated    History of hyperlipidemia --Statin able to take p.o. and off BiPAP   History of hypertension: -- Hold home amlodipine, she looks dry, hold Lasix for now  H/o anxiety: -- Continue celexa, resume home vistaril   Best Practice (right click and "Reselect all SmartList Selections" daily)   Diet/type: regular diet DVT prophylaxis: lovenox SQ Pressure ulcer(s): na GI prophylaxis: na Lines: na Foley: na Code Status: full code Last date of multidisciplinary goals of care  discussion [3/26]  Labs   CBC: Recent Labs  Lab 06/22/23 0812 06/23/23 0802 06/23/23 1138 06/24/23 0609  WBC 7.5 8.4  --  8.5  NEUTROABS 4.8 6.7  --   --   HGB 13.5 13.5 12.2 12.5  HCT 42.6 40.5 36.0 38.0  MCV 95.7 90.8  --  90.9   PLT 135* 181  --  148*    Basic Metabolic Panel: Recent Labs  Lab 06/22/23 0812 06/23/23 0845 06/23/23 1138 06/24/23 0609  NA 139 138 138 136  K 3.8 3.5 3.9 4.3  CL 106 105 102 103  CO2 23 22  --  24  GLUCOSE 105* 115* 120* 98  BUN 12 18 19 20   CREATININE 0.82 0.88 1.00 0.81  CALCIUM 8.8* 8.4*  --  9.1   GFR: Estimated Creatinine Clearance: 39 mL/min (by C-G formula based on SCr of 0.81 mg/dL). Recent Labs  Lab 06/22/23 0812 06/23/23 0802 06/23/23 0923 06/23/23 0951 06/24/23 0609 06/24/23 1429  PROCALCITON  --   --   --   --   --  0.13  WBC 7.5 8.4  --   --  8.5  --   LATICACIDVEN  --   --  1.5 1.8  --   --     Liver Function Tests: Recent Labs  Lab 06/22/23 0812 06/23/23 0845  AST 55* 58*  ALT 26 32  ALKPHOS 70 59  BILITOT 0.9 0.7  PROT 6.0* 5.5*  ALBUMIN 3.6 3.2*   No results for input(s): "LIPASE", "AMYLASE" in the last 168 hours. No results for input(s): "AMMONIA" in the last 168 hours.  ABG    Component Value Date/Time   PHART 7.422 12/17/2018 1415   PCO2ART 38.0 12/17/2018 1415   PO2ART 94.9 12/17/2018 1415   HCO3 29.5 (H) 06/24/2023 1143   TCO2 25 06/23/2023 1138   ACIDBASEDEF 0.2 06/24/2023 1143   O2SAT 92 06/24/2023 1143     Coagulation Profile: No results for input(s): "INR", "PROTIME" in the last 168 hours.  Cardiac Enzymes: No results for input(s): "CKTOTAL", "CKMB", "CKMBINDEX", "TROPONINI" in the last 168 hours.  HbA1C: Hgb A1c MFr Bld  Date/Time Value Ref Range Status  09/16/2022 10:01 AM 5.5 4.8 - 5.6 % Final    Comment:             Prediabetes: 5.7 - 6.4          Diabetes: >6.4          Glycemic control for adults with diabetes: <7.0   09/02/2021 09:01 AM 5.3 4.8 - 5.6 % Final    Comment:             Prediabetes: 5.7 - 6.4          Diabetes: >6.4          Glycemic control for adults with diabetes: <7.0     CBG: Recent Labs  Lab 06/24/23 1526 06/24/23 1950  GLUCAP 109* 126*    Review of Systems:   As  above  Past Medical History:  She,  has a past medical history of Anxiety, ASCUS on Pap smear (11/15/2009), Carotid artery disease (HCC), Cataract, COPD (chronic obstructive pulmonary disease) (HCC), Depression, Duodenal ulcer due to Helicobacter pylori (06/28/2020), Endometrial polyp (11/15/2009), Endometrioid adenocarcinoma, H/O varicella, Heart murmur, Hemorrhoids, History of measles, mumps, or rubella, Hypercholesterolemia, Hypertension, Menopausal symptoms (06/13/2010), Ovarian cyst (11/15/2009), PMB (postmenopausal bleeding) (11/15/2009), Presence of permanent cardiac pacemaker (10/20/2018), and Vaginal bleeding.   Surgical History:   Past Surgical  History:  Procedure Laterality Date   BIOPSY  06/19/2020   Procedure: BIOPSY;  Surgeon: Mansouraty, Netty Starring., MD;  Location: [facility] ENDOSCOPY;  Service: Gastroenterology;;   BREAST SURGERY     Over twenty - five  years ago   CATARACT EXTRACTION W/ INTRAOCULAR LENS IMPLANT Bilateral    CERVICAL BIOPSY     ESOPHAGOGASTRODUODENOSCOPY (EGD) WITH PROPOFOL N/A 06/19/2020   Procedure: ESOPHAGOGASTRODUODENOSCOPY (EGD) WITH PROPOFOL;  Surgeon: Lemar Lofty., MD;  Location: [facility] ENDOSCOPY;  Service: Gastroenterology;  Laterality: N/A;   EYE SURGERY     INSERT / REPLACE / REMOVE PACEMAKER  10/20/2018   LAPAROSCOPIC ASSISTED VAGINAL HYSTERECTOMY  12/2009   BSO   MULTIPLE EXTRACTIONS WITH ALVEOLOPLASTY N/A 12/09/2018   Procedure: EXTRACTION OF TOOTH #'S 6,21-29 AND 32 WITH ALVEOLOPLASTY AND BILATERAL MANDIBULAR TORI REDUCTIONS;  Surgeon: Charlynne Pander, DDS;  Location: [facility] OR;  Service: Oral Surgery;  Laterality: N/A;   PACEMAKER IMPLANT N/A 10/20/2018   Procedure: PACEMAKER IMPLANT;  Surgeon: Duke Salvia, MD;  Location: [facility] INVASIVE CV LAB;  Service: Cardiovascular;  Laterality: N/A;   RIGHT/LEFT HEART CATH AND CORONARY ANGIOGRAPHY N/A 11/24/2018   Procedure: RIGHT/LEFT HEART CATH AND CORONARY ANGIOGRAPHY;  Surgeon: Kathleene Hazel, MD;   Location: [facility] INVASIVE CV LAB;  Service: Cardiovascular;  Laterality: N/A;   TEE WITHOUT CARDIOVERSION N/A 12/21/2018   Procedure: TRANSESOPHAGEAL ECHOCARDIOGRAM (TEE);  Surgeon: Kathleene Hazel, MD;  Location: [facility] OR;  Service: Open Heart Surgery;  Laterality: N/A;     Social History:   reports that she has been smoking cigarettes. She has a 13.8 pack-year smoking history. She has never used smokeless tobacco. She reports that she does not currently use drugs. She reports that she does not drink alcohol.   Family History:  Her family history includes Heart disease in her brother; Heart failure in her mother; Lung cancer in her brother; Parkinson's disease in her father. There is no history of Colon cancer, Colon polyps, or Esophageal cancer.   Allergies No Known Allergies   Home Medications  Prior to Admission medications   Medication Sig Start Date End Date Taking? Authorizing Provider  acetaminophen (TYLENOL) 325 MG tablet Take 2 tablets (650 mg total) by mouth every 6 (six) hours as needed for moderate pain. 07/26/21   Wallis Bamberg, PA-C  albuterol (VENTOLIN HFA) 108 (90 Base) MCG/ACT inhaler INHALE 2 PUFFS BY MOUTH EVERY 4 HOURS AS NEEDED FOR WHEEZING FOR SHORTNESS OF BREATH Patient taking differently: Inhale 2 puffs into the lungs every 4 (four) hours as needed for wheezing or shortness of breath. 03/23/23   Sandre Kitty, MD  amLODipine (NORVASC) 10 MG tablet TAKE 1 TABLET BY MOUTH ONCE DAILY . APPOINTMENT REQUIRED FOR FUTURE REFILLS WITH  CARDIOLOGIST  IN  OCTOBER  2024,  FINAL  ATTEMPT Patient taking differently: Take 10 mg by mouth daily. 02/16/23   Kathleene Hazel, MD  aspirin 81 MG chewable tablet Chew 1 tablet (81 mg total) by mouth daily. 12/23/18   Barrett, Erin R, PA-C  azithromycin (ZITHROMAX) 500 MG tablet Take 1 tablet (500 mg total) by mouth daily for 5 days. 06/22/23 06/27/23  Royanne Foots, DO  carboxymethylcellulose (REFRESH PLUS) 0.5 % SOLN Place 1 drop  into both eyes daily as needed (dry eyes).    [provider]  Cholecalciferol (VITAMIN D-3) 125 MCG (5000 UT) TABS Take 5,000 Units by mouth daily.    [provider]  citalopram (CELEXA) 40 MG tablet Take 1/2 (one-half)  tablet by mouth once daily Patient taking differently: Take 20 mg by mouth daily. 02/16/23   Sandre Kitty, MD  furosemide (LASIX) 20 MG tablet Take 1 tablet (20 mg total) by mouth daily. 02/02/23   Kathleene Hazel, MD  halobetasol (ULTRAVATE) 0.05 % cream Apply topically 2 (two) times daily. 04/24/23   Sandre Kitty, MD  hydrOXYzine (VISTARIL) 50 MG capsule Take 1 capsule (50 mg total) by mouth 3 (three) times daily as needed for anxiety. 01/22/23   Sandre Kitty, MD  Omega-3 Fatty Acids (FISH OIL) 1000 MG CAPS Take 1,000 mg by mouth daily.    [provider]  pantoprazole (PROTONIX) 20 MG tablet Take 1 tablet by mouth once daily 04/27/23   Iva Boop, MD  predniSONE (DELTASONE) 50 MG tablet Take 1 tablet (50 mg total) by mouth daily with breakfast for 4 days. 06/22/23 06/26/23  Royanne Foots, DO  simvastatin (ZOCOR) 20 MG tablet Take 1 tablet by mouth once daily 04/27/23   Sandre Kitty, MD  TRELEGY ELLIPTA 100-62.5-25 MCG/ACT AEPB Inhale 1 puff into the lungs daily. 06/11/23   [provider]  umeclidinium-vilanterol (ANORO ELLIPTA) 62.5-25 MCG/ACT AEPB Inhale 1 puff into the lungs daily. 06/11/23   Glenford Bayley, NP  vitamin B-12 (CYANOCOBALAMIN) 500 MCG tablet Take 500 mcg by mouth daily.    [provider]  vitamin E 180 MG (400 UNITS) capsule Take 400 Units by mouth daily.    [provider]     Critical care time: 75    Karren Burly, MD [facility] 06/25/23 9:20 AM  Please see [email or web address] for pager details.  From 7A-7P if no response, please call [phone] After hours, please call ELink [phone]

## 2023-06-25 NOTE — Evaluation (Signed)
 Occupational Therapy Evaluation Patient Details Name: [name] MRN: [id number] DOB: 1935/12/23 Today's Date: 06/25/2023   History of Present Illness   88 yo F admitted 3/25 with SOB, PNA (seen in ED 3/24) and COPD exacerbation. 3/26 respiratory decline requiring bipap. PMhx: COPD, COVID Jan 2025,TAVR, PPM, GIB, endometrial CA, HTN, HLD, GAD     Clinical Impressions PTA, pt lives alone and typically Independent with ADLs, IADLs, driving short distances and mobility without AD. Family/friends will assist with groceries as needed. Pt presents now with deficits in cardiopulmonary endurance and strength. Overall, pt requires Supervision for transfers and ADLs- limited in mobility attempts due to SOB (though improved from this AM per pt). Pt's niece at bedside and discussing what assistance pt may need at DC. Pending progression in mobility and respiratory status, feel pt can DC home with HHOT. Will continue to follow acutely.  HR 110s, SpO2 95% on 6 L O2, RR 25     If plan is discharge home, recommend the following:   Assistance with cooking/housework;Assist for transportation     Functional Status Assessment   Patient has had a recent decline in their functional status and demonstrates the ability to make significant improvements in function in a reasonable and predictable amount of time.     Equipment Recommendations   Other (comment) (TBD pending progress)     Recommendations for Other Services         Precautions/Restrictions   Precautions Precautions: Fall;Other (comment) Recall of Precautions/Restrictions: Intact Precaution/Restrictions Comments: watch O2 Restrictions Weight Bearing Restrictions Per Provider Order: No     Mobility Bed Mobility Overal bed mobility: Modified Independent Bed Mobility: Sit to Supine       Sit to supine: Modified independent (Device/Increase time)        Transfers Overall transfer level: Needs assistance Equipment  used: None Transfers: Sit to/from Stand, Bed to chair/wheelchair/BSC Sit to Stand: Supervision Stand pivot transfers: Supervision                Balance Overall balance assessment: No apparent balance deficits (not formally assessed)                                         ADL either performed or assessed with clinical judgement   ADL Overall ADL's : Needs assistance/impaired Eating/Feeding: Independent   Grooming: Set up;Sitting   Upper Body Bathing: Set up;Sitting   Lower Body Bathing: Supervison/ safety;Sit to/from stand;Sitting/lateral leans   Upper Body Dressing : Set up;Sitting   Lower Body Dressing: Supervision/safety;Sitting/lateral leans;Sit to/from stand   Toilet Transfer: Supervision/safety;Stand-pivot   Toileting- Architect and Hygiene: Supervision/safety;Sit to/from stand;Sitting/lateral lean         General ADL Comments: Educated on energy conservation for dressing, bathing, IADL mgmt at home and in community. provided handout and pt implementing many strategies already.     Vision Baseline Vision/History: 1 Wears glasses Ability to See in Adequate Light: 0 Adequate Patient Visual Report: No change from baseline Vision Assessment?: Wears glasses for reading     Perception         Praxis         Pertinent Vitals/Pain Pain Assessment Pain Assessment: No/denies pain     Extremity/Trunk Assessment Upper Extremity Assessment Upper Extremity Assessment: Generalized weakness;Right hand dominant   Lower Extremity Assessment Lower Extremity Assessment: Defer to PT evaluation   Cervical / Trunk Assessment Cervical /  Trunk Assessment: Normal   Communication Communication Communication: Impaired Factors Affecting Communication: Hearing impaired   Cognition Arousal: Alert Behavior During Therapy: WFL for tasks assessed/performed Cognition: No apparent impairments                                Following commands: Intact       Cueing  General Comments   Cueing Techniques: Verbal cues;Gestural cues  Niece at bedside, supportive and can work to arrange some assist as needed at DC   Exercises     Shoulder Instructions      Home Living Family/patient expects to be discharged to:: Private residence Living Arrangements: Alone Available Help at Discharge: Neighbor Type of Home: House Home Access: Stairs to enter Secretary/administrator of Steps: 3   Home Layout: One level     Bathroom Shower/Tub: Chief Strategy Officer: Standard     Home Equipment: Agricultural consultant (2 wheels);BSC/3in1          Prior Functioning/Environment Prior Level of Function : Independent/Modified Independent;Driving             Mobility Comments: drives locally, no AD for mobility ADLs Comments: Indep with ADLs, likes to get in tub, manages IADLs. friends/family assist with bringing bulk groceries and if pt needs small items in between- will drive and go to store nearby (uses shopping cart vs electric cart). has a yorkie that she cares for    OT Problem List: Decreased activity tolerance;Impaired balance (sitting and/or standing);Decreased strength;Cardiopulmonary status limiting activity   OT Treatment/Interventions: Self-care/ADL training;Therapeutic exercise;Energy conservation;DME and/or AE instruction;Therapeutic activities;Patient/family education      OT Goals(Current goals can be found in the care plan section)   Acute Rehab OT Goals Patient Stated Goal: improve breathing OT Goal Formulation: With patient Time For Goal Achievement: 07/09/23 Potential to Achieve Goals: Good ADL Goals Pt Will Transfer to Toilet: with modified independence;ambulating Additional ADL Goal #1: Pt to implement at least 3 energy conservation strategies during ADLs, IADLs and mobility Additional ADL Goal #2: Pt to increase standing activity tolerance > 10 min during ADLs, IADLs and  mobility without seated rest break   OT Frequency:  Min 1X/week    Co-evaluation              AM-PAC OT "6 Clicks" Daily Activity     Outcome Measure Help from another person eating meals?: None Help from another person taking care of personal grooming?: A Little Help from another person toileting, which includes using toliet, bedpan, or urinal?: A Little Help from another person bathing (including washing, rinsing, drying)?: A Little Help from another person to put on and taking off regular upper body clothing?: A Little Help from another person to put on and taking off regular lower body clothing?: A Little 6 Click Score: 19   End of Session Equipment Utilized During Treatment: Oxygen Nurse Communication: Mobility status  Activity Tolerance: Patient tolerated treatment well Patient left: in bed;with call bell/phone within reach  OT Visit Diagnosis: Muscle weakness (generalized) (M62.81)                Time: 1610-9604 OT Time Calculation (min): 25 min Charges:  OT General Charges $OT Visit: 1 Visit OT Evaluation $OT Eval Moderate Complexity: 1 Mod OT Treatments $Self Care/Home Management : 8-22 mins  Bradd Canary, OTR/L Acute Rehab Services Office: [phone]   [name] 06/25/2023, 2:17 PM

## 2023-06-25 NOTE — Progress Notes (Signed)
 Upon assessment, patient with mildly uneven pupils. Right pupil 5mm, briskly reactive, and left pupil 4mm, briskly reactive. Pt moving all extremities equally and to command. Facial movements symmetrical. A&O x 4. Findings reported to [facility]. No new orders received at this time.

## 2023-06-25 NOTE — Progress Notes (Addendum)
 PT Cancellation Note  Patient Details Name: [name] MRN: [id number] DOB: 01/22/1936   Cancelled Treatment:    Reason Eval/Treat Not Completed: Patient declined, no reason specified (attempted to see pt at 0745 but she deferred until after breakfast, will reattempt)  Attempted again 0915 and pt requesting anxiety meds first.   [name] 06/25/2023, 8:28 AM Merryl Hacker, PT Acute Rehabilitation Services Office: [phone]

## 2023-06-25 NOTE — Evaluation (Signed)
 Physical Therapy Evaluation Patient Details Name: [name] MRN: [id number] DOB: 19-Mar-1936 Today's Date: 06/25/2023  History of Present Illness  88 yo F admitted 3/25 with SOB, PNA (seen in ED 3/24) and COPD exacerbation. 3/26 respiratory decline requiring bipap. PMhx: COPD, COVID Jan 2025,TAVR, PPM, GIB, endometrial CA, HTN, HLD, GAD  Clinical Impression  Pt pleasant and agreeable to mobility with encouragement and reassurance. Pt able to stand and pivot to chair but anxious about further mobility with SPO2 dropping to 89% on 50% FiO2 bipap with transfer. 2nd standing trial pt tolerated well without desaturation but she denied further mobility or HEP. Pt lives alone and has supportive friends and niece but no 24hr care and if she needs assist at D/C will benefit from continued inpatient follow up therapy, <3 hours/day. Will follow acutely to maximize strength, transfers, mobility and function to decrease burden of care.         If plan is discharge home, recommend the following: A little help with walking and/or transfers;A lot of help with bathing/dressing/bathroom;Assist for transportation   Can travel by private vehicle   Yes    Equipment Recommendations None recommended by PT  Recommendations for Other Services       Functional Status Assessment Patient has had a recent decline in their functional status and demonstrates the ability to make significant improvements in function in a reasonable and predictable amount of time.     Precautions / Restrictions Precautions Precautions: Fall;Other (comment) Recall of Precautions/Restrictions: Intact Precaution/Restrictions Comments: bipap, watch sats      Mobility  Bed Mobility Overal bed mobility: Needs Assistance Bed Mobility: Supine to Sit     Supine to sit: Supervision, Used rails, HOB elevated     General bed mobility comments: HOB 30 degrees with increased time and supervision for lines to pivot to EOB     Transfers Overall transfer level: Needs assistance   Transfers: Sit to/from Stand, Bed to chair/wheelchair/BSC Sit to Stand: Contact guard assist Stand pivot transfers: Contact guard assist         General transfer comment: CGA for lines with single UE support to stand and pivot to chair. pt stood additional trial from chair with cues for hand placement and maintained standing 35 sec prior to fatigue    Ambulation/Gait               General Gait Details: not yet able  Stairs            Wheelchair Mobility     Tilt Bed    Modified Rankin (Stroke Patients Only)       Balance Overall balance assessment: Mild deficits observed, not formally tested                                           Pertinent Vitals/Pain Pain Assessment Pain Assessment: No/denies pain    Home Living Family/patient expects to be discharged to:: Private residence Living Arrangements: Alone Available Help at Discharge: Neighbor Type of Home: House Home Access: Stairs to enter   Secretary/administrator of Steps: 3   Home Layout: One level Home Equipment: Agricultural consultant (2 wheels);BSC/3in1      Prior Function Prior Level of Function : Independent/Modified Independent;Driving             Mobility Comments: drives locally       Extremity/Trunk Assessment   Upper Extremity  Assessment Upper Extremity Assessment: Generalized weakness    Lower Extremity Assessment Lower Extremity Assessment: Generalized weakness    Cervical / Trunk Assessment Cervical / Trunk Assessment: Normal  Communication   Communication Communication: Impaired Factors Affecting Communication: Hearing impaired    Cognition Arousal: Alert Behavior During Therapy: WFL for tasks assessed/performed   PT - Cognitive impairments: No apparent impairments                         Following commands: Intact       Cueing Cueing Techniques: Verbal cues, Gestural cues      General Comments      Exercises     Assessment/Plan    PT Assessment Patient needs continued PT services  PT Problem List Decreased activity tolerance;Decreased balance;Decreased mobility;Cardiopulmonary status limiting activity       PT Treatment Interventions Gait training;Functional mobility training;Therapeutic activities;Therapeutic exercise;Patient/family education;Balance training;DME instruction;Stair training    PT Goals (Current goals can be found in the Care Plan section)  Acute Rehab PT Goals Patient Stated Goal: return home PT Goal Formulation: With patient/family Time For Goal Achievement: 07/09/23 Potential to Achieve Goals: Good    Frequency Min 2X/week     Co-evaluation               AM-PAC PT "6 Clicks" Mobility  Outcome Measure Help needed turning from your back to your side while in a flat bed without using bedrails?: A Little Help needed moving from lying on your back to sitting on the side of a flat bed without using bedrails?: A Little Help needed moving to and from a bed to a chair (including a wheelchair)?: A Little Help needed standing up from a chair using your arms (e.g., wheelchair or bedside chair)?: A Little Help needed to walk in hospital room?: A Lot Help needed climbing 3-5 steps with a railing? : Total 6 Click Score: 15    End of Session   Activity Tolerance: Patient tolerated treatment well Patient left: in chair;with call bell/phone within reach;with chair alarm set;with family/visitor present Nurse Communication: Mobility status PT Visit Diagnosis: Other abnormalities of gait and mobility (R26.89);Muscle weakness (generalized) (M62.81)    Time: 1610-9604 PT Time Calculation (min) (ACUTE ONLY): 22 min   Charges:   PT Evaluation $PT Eval Moderate Complexity: 1 Mod   PT General Charges $$ ACUTE PT VISIT: 1 Visit         Merryl Hacker, PT Acute Rehabilitation Services Office: [phone]   Enedina Finner  Greyson Peavy 06/25/2023, 11:42 AM

## 2023-06-26 DIAGNOSIS — J9622 Acute and chronic respiratory failure with hypercapnia: Secondary | ICD-10-CM | POA: Diagnosis not present

## 2023-06-26 DIAGNOSIS — J9621 Acute and chronic respiratory failure with hypoxia: Secondary | ICD-10-CM

## 2023-06-26 LAB — BASIC METABOLIC PANEL WITH GFR
Anion gap: 6 (ref 5–15)
BUN: 34 mg/dL — ABNORMAL HIGH (ref 8–23)
CO2: 28 mmol/L (ref 22–32)
Calcium: 8.5 mg/dL — ABNORMAL LOW (ref 8.9–10.3)
Chloride: 102 mmol/L (ref 98–111)
Creatinine, Ser: 0.92 mg/dL (ref 0.44–1.00)
GFR, Estimated: >60 mL/min (ref >60)
Glucose, Bld: 100 mg/dL — ABNORMAL HIGH (ref 70–99)
Potassium: 5 mmol/L (ref 3.5–5.1)
Sodium: 136 mmol/L (ref 135–145)

## 2023-06-26 LAB — CBC
HCT: 39.9 % (ref 36.0–46.0)
Hemoglobin: 12.8 g/dL (ref 12.0–15.0)
MCH: 30 pg (ref 26.0–34.0)
MCHC: 32.1 g/dL (ref 30.0–36.0)
MCV: 93.4 fL (ref 80.0–100.0)
Platelets: 161 10*3/uL (ref 150–400)
RBC: 4.27 MIL/uL (ref 3.87–5.11)
RDW: 15.1 % (ref 11.5–15.5)
WBC: 10.5 10*3/uL (ref 4.0–10.5)
nRBC: 0 % (ref 0.0–0.2)

## 2023-06-26 LAB — EXPECTORATED SPUTUM ASSESSMENT W GRAM STAIN, RFLX TO RESP C: Special Requests: NORMAL

## 2023-06-26 LAB — GLUCOSE, CAPILLARY: Glucose-Capillary: 91 mg/dL (ref 70–99)

## 2023-06-26 LAB — MAGNESIUM: Magnesium: 2.5 mg/dL — ABNORMAL HIGH (ref 1.7–2.4)

## 2023-06-26 MED ORDER — PREDNISONE 20 MG PO TABS
20.0000 mg | ORAL_TABLET | Freq: Every day | ORAL | Status: AC
  Administered 2023-06-30 – 2023-07-02 (×3): 20 mg via ORAL
  Filled 2023-06-26 (×3): qty 1

## 2023-06-26 MED ORDER — ENOXAPARIN SODIUM 30 MG/0.3ML IJ SOSY
30.0000 mg | PREFILLED_SYRINGE | Freq: Every day | INTRAMUSCULAR | Status: DC
  Administered 2023-06-26 – 2023-07-03 (×8): 30 mg via SUBCUTANEOUS
  Filled 2023-06-26 (×8): qty 0.3

## 2023-06-26 MED ORDER — ENSURE ENLIVE PO LIQD
237.0000 mL | Freq: Two times a day (BID) | ORAL | Status: DC
  Administered 2023-06-27 – 2023-07-03 (×9): 237 mL via ORAL

## 2023-06-26 MED ORDER — AMLODIPINE BESYLATE 10 MG PO TABS
10.0000 mg | ORAL_TABLET | Freq: Every day | ORAL | Status: DC
  Administered 2023-06-26 – 2023-07-03 (×8): 10 mg via ORAL
  Filled 2023-06-26 (×8): qty 1

## 2023-06-26 NOTE — Progress Notes (Signed)
 NAME:  [name], MRN:  [id number], DOB:  11-15-1935, LOS: 2 ADMISSION DATE:  06/23/2023, CONSULTATION DATE:  3/26 REFERRING MD:  Danise Edge CHIEF COMPLAINT:  respiratory distress    History of Present Illness:  88 year old female with past medical history of hypertension, hyperlipidemia, CAD, AS s/p TAVR, PPM, COPD not on home O2 who presented to the ED on 3/25 with shortness of breath. Symptoms beginning on Sunday 3/23 with cough, increased sputum production, dyspnea. She presented to the ED initially on 3/24 with shortness of breath. She was found to have pneumonia and was discharged on azithromycin and prednisone. She had ongoing, worsening symptoms which is why she returned. O2 sat 85% on room air, diaphoretic with complaint of chills at home. She was given LR, rocephin/azithromycin, duoneb and admitted to IMTS. On chart review, appears yesterday she had desaturation requiring RT to place her on NRB. They were called 2 more times to bedside for additional breathing treatments related to her dyspnea. PCCM consulted this morning for increasing oxygen requirement up to [facility], increasing anxiety and worsened shortness of breath.   Pertinent  Medical History  hypertension, hyperlipidemia, CAD, AS s/p TAVR, PPM, COPD not on home O2  Significant Hospital Events: Including procedures, antibiotic start and stop dates in addition to other pertinent events   3/25: admit for AECOPD, pneumonia. Antibiotics. Multiple treatments.  3/26: worsening dyspnea, increasing O2, ccm consult   Interim History / Subjective:  Seems better, less SOB, she wants BiPAP when needed  Objective   Blood pressure (!) 140/59, pulse 97, temperature 98 F (36.7 C), temperature source Axillary, resp. rate (!) 25, height 5\' 3"  (1.6 m), weight 48.6 kg, SpO2 (!) 84%.    Vent Mode: BIPAP;PCV FiO2 (%):  [50 %] 50 % Set Rate:  [20 bmp] 20 bmp PEEP:  [5 cmH20] 5 cmH20   Intake/Output Summary (Last 24 hours) at 06/26/2023 0920 Last  data filed at 06/26/2023 0600 Gross per 24 hour  Intake 513.95 ml  Output 450 ml  Net 63.95 ml   Filed Weights   06/23/23 0803 06/24/23 1215  Weight: 48 kg 48.6 kg    Examination: General: elderly, frail and thin, moderate resp distress  HENT: ncat, anicteric sclera, mucous membranes dry, nasal cannula  Lungs: Wheeze improved, prolonged expiration, no accessory muscle use and tachypnea improved Cardiovascular: s1s2, rrr Abdomen: flat, soft  Extremities: no edema Neuro: alert and oriented GU: no foley  Resolved Hospital Problem list    Assessment & Plan:  Acute on chronic hypercarbic respiratory failure and acute hypoxemic respiratory failure due to COPD exacerbation related to presumed community-acquired pneumonia and rhinovirus.  With bilateral infiltrates. -- Decrease steroids to 40 mg Solumedrol IV daily (increased anxiety) -- Continue CAP coverage, Legionella sent,strep antigen negative -- Continue Yupelri, budesonide, arformoterol, scheduled and as needed DuoNebs -- Fisher O2 oal 88%, BIPAP at night   History of hyperlipidemia --home statin   History of hypertension: -- Resume home amlodipine, hold Lasix for now  H/o anxiety: -- Continue celexa, home vistaril  -- Would likely benefit from up titration of celexa, consider Buspar, avoid benzos  Best Practice (right click and "Reselect all SmartList Selections" daily)   Diet/type: regular diet DVT prophylaxis: lovenox SQ Pressure ulcer(s): na GI prophylaxis: na Lines: na Foley: na Code Status: full code Last date of multidisciplinary goals of care discussion [3/26]  Labs   CBC: Recent Labs  Lab 06/22/23 0812 06/23/23 0802 06/23/23 1138 06/24/23 0609 06/26/23 0353  WBC 7.5 8.4  --  8.5 10.5  NEUTROABS 4.8 6.7  --   --   --   HGB 13.5 13.5 12.2 12.5 12.8  HCT 42.6 40.5 36.0 38.0 39.9  MCV 95.7 90.8  --  90.9 93.4  PLT 135* 181  --  148* 161    Basic Metabolic Panel: Recent Labs  Lab 06/22/23 0812  06/23/23 0845 06/23/23 1138 06/24/23 0609 06/26/23 0353  NA 139 138 138 136 136  K 3.8 3.5 3.9 4.3 5.0  CL 106 105 102 103 102  CO2 23 22  --  24 28  GLUCOSE 105* 115* 120* 98 100*  BUN 12 18 19 20  34*  CREATININE 0.82 0.88 1.00 0.81 0.92  CALCIUM 8.8* 8.4*  --  9.1 8.5*  MG  --   --   --   --  2.5*   GFR: Estimated Creatinine Clearance: 34.3 mL/min (by C-G formula based on SCr of 0.92 mg/dL). Recent Labs  Lab 06/22/23 0812 06/23/23 0802 06/23/23 0923 06/23/23 0951 06/24/23 0609 06/24/23 1429 06/26/23 0353  PROCALCITON  --   --   --   --   --  0.13  --   WBC 7.5 8.4  --   --  8.5  --  10.5  LATICACIDVEN  --   --  1.5 1.8  --   --   --     Liver Function Tests: Recent Labs  Lab 06/22/23 0812 06/23/23 0845  AST 55* 58*  ALT 26 32  ALKPHOS 70 59  BILITOT 0.9 0.7  PROT 6.0* 5.5*  ALBUMIN 3.6 3.2*   No results for input(s): "LIPASE", "AMYLASE" in the last 168 hours. No results for input(s): "AMMONIA" in the last 168 hours.  ABG    Component Value Date/Time   PHART 7.422 12/17/2018 1415   PCO2ART 38.0 12/17/2018 1415   PO2ART 94.9 12/17/2018 1415   HCO3 29.5 (H) 06/24/2023 1143   TCO2 25 06/23/2023 1138   ACIDBASEDEF 0.2 06/24/2023 1143   O2SAT 92 06/24/2023 1143     Coagulation Profile: No results for input(s): "INR", "PROTIME" in the last 168 hours.  Cardiac Enzymes: No results for input(s): "CKTOTAL", "CKMB", "CKMBINDEX", "TROPONINI" in the last 168 hours.  HbA1C: Hgb A1c MFr Bld  Date/Time Value Ref Range Status  09/16/2022 10:01 AM 5.5 4.8 - 5.6 % Final    Comment:             Prediabetes: 5.7 - 6.4          Diabetes: >6.4          Glycemic control for adults with diabetes: <7.0   09/02/2021 09:01 AM 5.3 4.8 - 5.6 % Final    Comment:             Prediabetes: 5.7 - 6.4          Diabetes: >6.4          Glycemic control for adults with diabetes: <7.0     CBG: Recent Labs  Lab 06/24/23 1526 06/24/23 1950 06/25/23 1130 06/26/23 0321   GLUCAP 109* 126* 115* 91    Review of Systems:   As above  Past Medical History:  She,  has a past medical history of Anxiety, ASCUS on Pap smear (11/15/2009), Carotid artery disease (HCC), Cataract, COPD (chronic obstructive pulmonary disease) (HCC), Depression, Duodenal ulcer due to Helicobacter pylori (06/28/2020), Endometrial polyp (11/15/2009), Endometrioid adenocarcinoma, H/O varicella, Heart murmur, Hemorrhoids, History of measles, mumps, or rubella, Hypercholesterolemia, Hypertension, Menopausal symptoms (06/13/2010), Ovarian cyst (  11/15/2009), PMB (postmenopausal bleeding) (11/15/2009), Presence of permanent cardiac pacemaker (10/20/2018), and Vaginal bleeding.   Surgical History:   Past Surgical History:  Procedure Laterality Date   BIOPSY  06/19/2020   Procedure: BIOPSY;  Surgeon: Mansouraty, Netty Starring., MD;  Location: [facility] ENDOSCOPY;  Service: Gastroenterology;;   BREAST SURGERY     Over twenty - five  years ago   CATARACT EXTRACTION W/ INTRAOCULAR LENS IMPLANT Bilateral    CERVICAL BIOPSY     ESOPHAGOGASTRODUODENOSCOPY (EGD) WITH PROPOFOL N/A 06/19/2020   Procedure: ESOPHAGOGASTRODUODENOSCOPY (EGD) WITH PROPOFOL;  Surgeon: Lemar Lofty., MD;  Location: [facility] ENDOSCOPY;  Service: Gastroenterology;  Laterality: N/A;   EYE SURGERY     INSERT / REPLACE / REMOVE PACEMAKER  10/20/2018   LAPAROSCOPIC ASSISTED VAGINAL HYSTERECTOMY  12/2009   BSO   MULTIPLE EXTRACTIONS WITH ALVEOLOPLASTY N/A 12/09/2018   Procedure: EXTRACTION OF TOOTH #'S 6,21-29 AND 32 WITH ALVEOLOPLASTY AND BILATERAL MANDIBULAR TORI REDUCTIONS;  Surgeon: Charlynne Pander, DDS;  Location: [facility] OR;  Service: Oral Surgery;  Laterality: N/A;   PACEMAKER IMPLANT N/A 10/20/2018   Procedure: PACEMAKER IMPLANT;  Surgeon: Duke Salvia, MD;  Location: [facility] INVASIVE CV LAB;  Service: Cardiovascular;  Laterality: N/A;   RIGHT/LEFT HEART CATH AND CORONARY ANGIOGRAPHY N/A 11/24/2018   Procedure: RIGHT/LEFT HEART CATH AND  CORONARY ANGIOGRAPHY;  Surgeon: Kathleene Hazel, MD;  Location: [facility] INVASIVE CV LAB;  Service: Cardiovascular;  Laterality: N/A;   TEE WITHOUT CARDIOVERSION N/A 12/21/2018   Procedure: TRANSESOPHAGEAL ECHOCARDIOGRAM (TEE);  Surgeon: Kathleene Hazel, MD;  Location: [facility] OR;  Service: Open Heart Surgery;  Laterality: N/A;     Social History:   reports that she has been smoking cigarettes. She has a 13.8 pack-year smoking history. She has never used smokeless tobacco. She reports that she does not currently use drugs. She reports that she does not drink alcohol.   Family History:  Her family history includes Heart disease in her brother; Heart failure in her mother; Lung cancer in her brother; Parkinson's disease in her father. There is no history of Colon cancer, Colon polyps, or Esophageal cancer.   Allergies No Known Allergies   Home Medications  Prior to Admission medications   Medication Sig Start Date End Date Taking? Authorizing Provider  acetaminophen (TYLENOL) 325 MG tablet Take 2 tablets (650 mg total) by mouth every 6 (six) hours as needed for moderate pain. 07/26/21   Wallis Bamberg, PA-C  albuterol (VENTOLIN HFA) 108 (90 Base) MCG/ACT inhaler INHALE 2 PUFFS BY MOUTH EVERY 4 HOURS AS NEEDED FOR WHEEZING FOR SHORTNESS OF BREATH Patient taking differently: Inhale 2 puffs into the lungs every 4 (four) hours as needed for wheezing or shortness of breath. 03/23/23   Sandre Kitty, MD  amLODipine (NORVASC) 10 MG tablet TAKE 1 TABLET BY MOUTH ONCE DAILY . APPOINTMENT REQUIRED FOR FUTURE REFILLS WITH  CARDIOLOGIST  IN  OCTOBER  2024,  FINAL  ATTEMPT Patient taking differently: Take 10 mg by mouth daily. 02/16/23   Kathleene Hazel, MD  aspirin 81 MG chewable tablet Chew 1 tablet (81 mg total) by mouth daily. 12/23/18   Barrett, Erin R, PA-C  azithromycin (ZITHROMAX) 500 MG tablet Take 1 tablet (500 mg total) by mouth daily for 5 days. 06/22/23 06/27/23  Royanne Foots, DO   carboxymethylcellulose (REFRESH PLUS) 0.5 % SOLN Place 1 drop into both eyes daily as needed (dry eyes).    [provider]  Cholecalciferol (VITAMIN D-3) 125 MCG (5000 UT)  TABS Take 5,000 Units by mouth daily.    [provider]  citalopram (CELEXA) 40 MG tablet Take 1/2 (one-half) tablet by mouth once daily Patient taking differently: Take 20 mg by mouth daily. 02/16/23   Sandre Kitty, MD  furosemide (LASIX) 20 MG tablet Take 1 tablet (20 mg total) by mouth daily. 02/02/23   Kathleene Hazel, MD  halobetasol (ULTRAVATE) 0.05 % cream Apply topically 2 (two) times daily. 04/24/23   Sandre Kitty, MD  hydrOXYzine (VISTARIL) 50 MG capsule Take 1 capsule (50 mg total) by mouth 3 (three) times daily as needed for anxiety. 01/22/23   Sandre Kitty, MD  Omega-3 Fatty Acids (FISH OIL) 1000 MG CAPS Take 1,000 mg by mouth daily.    [provider]  pantoprazole (PROTONIX) 20 MG tablet Take 1 tablet by mouth once daily 04/27/23   Iva Boop, MD  predniSONE (DELTASONE) 50 MG tablet Take 1 tablet (50 mg total) by mouth daily with breakfast for 4 days. 06/22/23 06/26/23  Royanne Foots, DO  simvastatin (ZOCOR) 20 MG tablet Take 1 tablet by mouth once daily 04/27/23   Sandre Kitty, MD  TRELEGY ELLIPTA 100-62.5-25 MCG/ACT AEPB Inhale 1 puff into the lungs daily. 06/11/23   [provider]  umeclidinium-vilanterol (ANORO ELLIPTA) 62.5-25 MCG/ACT AEPB Inhale 1 puff into the lungs daily. 06/11/23   Glenford Bayley, NP  vitamin B-12 (CYANOCOBALAMIN) 500 MCG tablet Take 500 mcg by mouth daily.    [provider]  vitamin E 180 MG (400 UNITS) capsule Take 400 Units by mouth daily.    [provider]     Critical care time:     Karren Burly, MD [facility] 06/26/23 9:20 AM  Please see [email or web address] for pager details.  From 7A-7P if no response, please call [phone] After hours, please call ELink [phone]

## 2023-06-26 NOTE — Progress Notes (Signed)
 Pt placed back on BIPAP after SpO2s dropped into the low 80s and RT called to bedside

## 2023-06-27 ENCOUNTER — Inpatient Hospital Stay (HOSPITAL_COMMUNITY)

## 2023-06-27 DIAGNOSIS — J9622 Acute and chronic respiratory failure with hypercapnia: Secondary | ICD-10-CM | POA: Diagnosis not present

## 2023-06-27 DIAGNOSIS — I10 Essential (primary) hypertension: Secondary | ICD-10-CM

## 2023-06-27 DIAGNOSIS — J441 Chronic obstructive pulmonary disease with (acute) exacerbation: Secondary | ICD-10-CM

## 2023-06-27 DIAGNOSIS — J9601 Acute respiratory failure with hypoxia: Secondary | ICD-10-CM | POA: Diagnosis not present

## 2023-06-27 DIAGNOSIS — J189 Pneumonia, unspecified organism: Principal | ICD-10-CM

## 2023-06-27 LAB — POCT I-STAT 7, (LYTES, BLD GAS, ICA,H+H)
Acid-Base Excess: 5 mmol/L — ABNORMAL HIGH (ref 0.0–2.0)
Bicarbonate: 32.5 mmol/L — ABNORMAL HIGH (ref 20.0–28.0)
Calcium, Ion: 1.28 mmol/L (ref 1.15–1.40)
HCT: 37 % (ref 36.0–46.0)
Hemoglobin: 12.6 g/dL (ref 12.0–15.0)
O2 Saturation: 95 %
Patient temperature: 36.9
Potassium: 5.2 mmol/L — ABNORMAL HIGH (ref 3.5–5.1)
Sodium: 136 mmol/L (ref 135–145)
TCO2: 34 mmol/L — ABNORMAL HIGH (ref 22–32)
pCO2 arterial: 63.2 mmHg — ABNORMAL HIGH (ref 32–48)
pH, Arterial: 7.319 — ABNORMAL LOW (ref 7.35–7.45)
pO2, Arterial: 82 mmHg — ABNORMAL LOW (ref 83–108)

## 2023-06-27 LAB — BASIC METABOLIC PANEL WITH GFR
Anion gap: 6 (ref 5–15)
Anion gap: 8 (ref 5–15)
BUN: 29 mg/dL — ABNORMAL HIGH (ref 8–23)
BUN: 27 mg/dL — ABNORMAL HIGH (ref 8–23)
CO2: 31 mmol/L (ref 22–32)
CO2: 27 mmol/L (ref 22–32)
Calcium: 9 mg/dL (ref 8.9–10.3)
Calcium: 8.8 mg/dL — ABNORMAL LOW (ref 8.9–10.3)
Chloride: 101 mmol/L (ref 98–111)
Chloride: 100 mmol/L (ref 98–111)
Creatinine, Ser: 0.7 mg/dL (ref 0.44–1.00)
Creatinine, Ser: 0.69 mg/dL (ref 0.44–1.00)
GFR, Estimated: >60 mL/min (ref >60)
GFR, Estimated: >60 mL/min (ref >60)
Glucose, Bld: 96 mg/dL (ref 70–99)
Glucose, Bld: 97 mg/dL (ref 70–99)
Potassium: 5.4 mmol/L — ABNORMAL HIGH (ref 3.5–5.1)
Potassium: 4.9 mmol/L (ref 3.5–5.1)
Sodium: 138 mmol/L (ref 135–145)
Sodium: 135 mmol/L (ref 135–145)

## 2023-06-27 LAB — MAGNESIUM: Magnesium: 2.4 mg/dL (ref 1.7–2.4)

## 2023-06-27 LAB — CBC
HCT: 40.7 % (ref 36.0–46.0)
Hemoglobin: 13.1 g/dL (ref 12.0–15.0)
MCH: 30.5 pg (ref 26.0–34.0)
MCHC: 32.2 g/dL (ref 30.0–36.0)
MCV: 94.9 fL (ref 80.0–100.0)
Platelets: 154 10*3/uL (ref 150–400)
RBC: 4.29 MIL/uL (ref 3.87–5.11)
RDW: 14.8 % (ref 11.5–15.5)
WBC: 7.5 10*3/uL (ref 4.0–10.5)
nRBC: 0 % (ref 0.0–0.2)

## 2023-06-27 MED ORDER — LACTATED RINGERS IV BOLUS
500.0000 mL | Freq: Once | INTRAVENOUS | Status: AC
  Administered 2023-06-27: 500 mL via INTRAVENOUS

## 2023-06-27 MED ORDER — SODIUM CHLORIDE 3 % IN NEBU: 4.0000 mL | INHALATION_SOLUTION | RESPIRATORY_TRACT | Status: AC | PRN

## 2023-06-27 MED ORDER — SODIUM ZIRCONIUM CYCLOSILICATE 10 G PO PACK: 10.0000 g | PACK | Freq: Once | ORAL | Status: DC

## 2023-06-27 NOTE — Progress Notes (Signed)
 NAME:  [name], MRN:  [id number], DOB:  [birth year], LOS: 3 ADMISSION DATE:  06/23/2023, CONSULTATION DATE:  3/26 REFERRING MD:  Danise Edge CHIEF COMPLAINT:  respiratory distress    History of Present Illness:  88 year old female with past medical history of hypertension, hyperlipidemia, CAD, AS s/p TAVR, PPM, COPD not on home O2 who presented to the ED on 3/25 with shortness of breath. Symptoms beginning on Sunday 3/23 with cough, increased sputum production, dyspnea. She presented to the ED initially on 3/24 with shortness of breath. She was found to have pneumonia and was discharged on azithromycin and prednisone. She had ongoing, worsening symptoms which is why she returned. O2 sat 85% on room air, diaphoretic with complaint of chills at home. She was given LR, rocephin/azithromycin, duoneb and admitted to IMTS. On chart review, appears yesterday she had desaturation requiring RT to place her on NRB. They were called 2 more times to bedside for additional breathing treatments related to her dyspnea. PCCM consulted this morning for increasing oxygen requirement up to [facility], increasing anxiety and worsened shortness of breath.   Pertinent  Medical History  hypertension, hyperlipidemia, CAD, AS s/p TAVR, PPM, COPD not on home O2  Significant Hospital Events: Including procedures, antibiotic start and stop dates in addition to other pertinent events   3/25: admit for AECOPD, pneumonia. Antibiotics. Multiple treatments.  3/26: worsening dyspnea, increasing O2, ccm consult   Interim History / Subjective:  Seems better, less SOB, she wants BiPAP when needed  Objective   Blood pressure (!) 139/44, pulse 88, temperature 98.2 F (36.8 C), temperature source Axillary, resp. rate 19, height 5\' 3"  (1.6 m), weight 50.4 kg, SpO2 100%.    Vent Mode: PCV;BIPAP FiO2 (%):  [35 %-60 %] 60 % Set Rate:  [20 bmp] 20 bmp PEEP:  [5 cmH20-6 cmH20] 6 cmH20   Intake/Output Summary (Last 24 hours) at 06/27/2023  0920 Last data filed at 06/27/2023 0600 Gross per 24 hour  Intake 100 ml  Output 450 ml  Net -350 ml   Filed Weights   06/23/23 0803 06/24/23 1215 06/27/23 0500  Weight: 48 kg 48.6 kg 50.4 kg    Examination: General: elderly, frail and thin, moderate resp distress  HENT: ncat, anicteric sclera, mucous membranes dry, nasal cannula  Lungs: Wheeze improved, prolonged expiration, no accessory muscle use and tachypnea improved Cardiovascular: s1s2, rrr Abdomen: flat, soft  Extremities: no edema Neuro: alert and oriented GU: no foley  Resolved Hospital Problem list    Assessment & Plan:  Acute on chronic hypercarbic respiratory failure and acute hypoxemic respiratory failure due to COPD exacerbation related to presumed community-acquired pneumonia and rhinovirus.  With bilateral infiltrates. P: Continue IV Solu-Medrol As needed BiPAP Continue Yupelri, budesonide, and arformoterol As needed DuoNebs Continue supplemental oxygen for sat goal greater than 88% Aspiration precautions Encourage adequate and frequent pulmonary hygiene As needed hypertonic nebs  Can trial heated high flow nasal cannula to break from BiPAP later today   History of hyperlipidemia P: Continue home statin  Essential hypertension P: Continue home amlodipine Held Lasix remains on hold  H/o anxiety P: Continue home Celexa and Vistaril Can consider up titration of Celexa versus adding BuSpar  Best Practice (right click and "Reselect all SmartList Selections" daily)   Diet/type: regular diet DVT prophylaxis: lovenox SQ Pressure ulcer(s): na GI prophylaxis: na Lines: na Foley: na Code Status: full code Last date of multidisciplinary goals of care discussion [3/26]  Critical care time:   Ashraf Mesta D. Tiburcio Pea,  NP-C [facility] Personal contact information can be found on Amion  If no contact or response made please call [phone] 06/27/2023, 9:42 AM

## 2023-06-27 NOTE — Progress Notes (Signed)
 eLink Physician-Brief Progress Note Patient Name: [name] DOB: [birth year] MRN: [id number]   Date of Service  06/27/2023  HPI/Events of Note  Patient is mildly confused.  eICU Interventions  ABG ordered to exclude hypercapnia.        [name] Stacey Maura 06/27/2023, 12:22 AM

## 2023-06-27 NOTE — Progress Notes (Signed)
   06/27/23 2156  Vent Select  Invasive or Noninvasive Noninvasive  Adult Vent Y  Adult Ventilator Settings  Vent Type Servo i  Vent Mode BIPAP;PSV  Set Rate 20 bmp  FiO2 (%) 40 %  I Time 0.8 Sec(s)  IPAP 14 cmH20  EPAP 8 cmH20  Pressure Control 8 cmH20  PEEP 6 cmH20  Adult Ventilator Measurements  Peak Airway Pressure 15 L/min  Mean Airway Pressure 8 cmH20  Resp Rate Spontaneous 0 br/min  Resp Rate Total 20 br/min  Exhaled Vt 525 mL  Measured Ve 13.7 L  I:E Ratio Measured 1:2.7  Auto PEEP 0 cmH20  Total PEEP 6 cmH20  Adult Ventilator Alarms  Alarms On Y  Ve High Alarm 24 L/min  Ve Low Alarm 4 L/min  Resp Rate High Alarm 40 br/min  Resp Rate Low Alarm 12  PEEP Low Alarm 3 cmH2O  Press High Alarm 30 cmH2O  VAP Prevention  HOB> 30 Degrees Y  Breath Sounds  Bilateral Breath Sounds Diminished  Vent Respiratory Assessment  Level of Consciousness Alert  Respiratory Pattern Regular;Unlabored;Symmetrical

## 2023-06-28 DIAGNOSIS — J441 Chronic obstructive pulmonary disease with (acute) exacerbation: Secondary | ICD-10-CM | POA: Diagnosis not present

## 2023-06-28 DIAGNOSIS — J9621 Acute and chronic respiratory failure with hypoxia: Secondary | ICD-10-CM | POA: Diagnosis not present

## 2023-06-28 LAB — BASIC METABOLIC PANEL WITH GFR
Anion gap: 4 — ABNORMAL LOW (ref 5–15)
BUN: 27 mg/dL — ABNORMAL HIGH (ref 8–23)
CO2: 32 mmol/L (ref 22–32)
Calcium: 8.5 mg/dL — ABNORMAL LOW (ref 8.9–10.3)
Chloride: 102 mmol/L (ref 98–111)
Creatinine, Ser: 0.65 mg/dL (ref 0.44–1.00)
GFR, Estimated: >60 mL/min (ref >60)
Glucose, Bld: 91 mg/dL (ref 70–99)
Potassium: 4.8 mmol/L (ref 3.5–5.1)
Sodium: 138 mmol/L (ref 135–145)

## 2023-06-28 LAB — CULTURE, BLOOD (ROUTINE X 2)
Culture: NO GROWTH
Culture: NO GROWTH
Special Requests: ADEQUATE

## 2023-06-28 LAB — CBC
HCT: 40.1 % (ref 36.0–46.0)
Hemoglobin: 13 g/dL (ref 12.0–15.0)
MCH: 30 pg (ref 26.0–34.0)
MCHC: 32.4 g/dL (ref 30.0–36.0)
MCV: 92.6 fL (ref 80.0–100.0)
Platelets: 169 10*3/uL (ref 150–400)
RBC: 4.33 MIL/uL (ref 3.87–5.11)
RDW: 14.3 % (ref 11.5–15.5)
WBC: 8.1 10*3/uL (ref 4.0–10.5)
nRBC: 0 % (ref 0.0–0.2)

## 2023-06-28 LAB — MAGNESIUM: Magnesium: 2.1 mg/dL (ref 1.7–2.4)

## 2023-06-28 MED ORDER — ACETAMINOPHEN 325 MG PO TABS
650.0000 mg | ORAL_TABLET | Freq: Four times a day (QID) | ORAL | Status: DC | PRN
  Administered 2023-06-28 – 2023-07-02 (×4): 650 mg via ORAL
  Filled 2023-06-28 (×4): qty 2

## 2023-06-28 NOTE — Progress Notes (Signed)
 Report called and vitals w/in parameters.

## 2023-06-28 NOTE — Progress Notes (Signed)
                 Subjective SOB has improved.   Physical exam Blood pressure (!) 132/50, pulse 82, temperature 98.1 F (36.7 C), temperature source Oral, resp. rate 18, height 5\' 3"  (1.6 m), weight 52.1 kg, SpO2 96%.  Physical Exam: Constitutional:  lying in bed, in no acute distress Cardiovascular: regular rate and rhythm, no m/r/g, no LEE Pulmonary/Chest: normal work of breathing on 6 L [place], diminished breath sounds bilaterally, no wheezing/crackle  Abdominal: soft, non-tender, non-distended Neurological: alert & oriented Skin: warm and dry Psych: normal mood and behavior   Weight change: 1.7 kg   Intake/Output Summary (Last 24 hours) at 06/28/2023 1315 Last data filed at 06/28/2023 0532 Gross per 24 hour  Intake --  Output 500 ml  Net -500 ml   Net IO Since Admission: -629.87 mL [06/28/23 1315]  Labs, images, and other studies    Latest Ref Rng & Units 06/28/2023    4:35 AM 06/27/2023    3:44 AM 06/27/2023   12:38 AM  CBC  WBC 4.0 - 10.5 K/uL 8.1  7.5    Hemoglobin 12.0 - 15.0 g/dL 02.7  25.3  66.4   Hematocrit 36.0 - 46.0 % 40.1  40.7  37.0   Platelets 150 - 400 K/uL 169  154         Latest Ref Rng & Units 06/28/2023    4:35 AM 06/27/2023   11:52 AM 06/27/2023    3:44 AM  BMP  Glucose 70 - 99 mg/dL 91  97  96   BUN 8 - 23 mg/dL 27  27  29    Creatinine 0.44 - 1.00 mg/dL 4.03  4.74  2.59   Sodium 135 - 145 mmol/L 138  135  138   Potassium 3.5 - 5.1 mmol/L 4.8  4.9  5.4   Chloride 98 - 111 mmol/L 102  100  101   CO2 22 - 32 mmol/L 32  27  31   Calcium 8.9 - 10.3 mg/dL 8.5  8.8  9.0      Assessment and plan Hospital day 6  Norissa MEKAILA TARNOW is a 88 y.o. female with PMH of COPD, HFpEF, CHB s/p pacemaker, severe aortic stenosis s/p TAVR, HTN, GAD, and protein-calorie malnutrition and is presenting with an acute on chronic respiratory failure with hypoxia 2/2 COPD exacerbation   Acute on chronic hypercarbic respiratory failure and acute hypoxemic respiratory failure  due to COPD exacerbation related to presumed community-acquired pneumonia and rhinovirus Symptoms are much improved and IMTS will be resuming care today. Labs look good and she is saturating well on 6 L. Will wean as tolerated. She has completed course of Rocephin and Zithromax. Per PCCM, will continue IV Solu-Medrol through tomorrow and transition to prednisone 4/1. Will also re-engage PT/OT to gage level of functionality. She is not on O2 at home so as she is feeling better, will check ambulatory O2. -Continue Solumedrol and scheduled Nebulizers -BiPAP prn  HTN Stable. Continue home Norvasc.   Protein calorie malnutrition, mild PT/OT. Ensure  BID.    GAD Continue home Celexa. Atarax prn.   HLD Continue home Zocor   Carmina Miller, DO 06/28/2023, 1:15 PM  After 5pm or weekend: [phone]

## 2023-06-29 DIAGNOSIS — J441 Chronic obstructive pulmonary disease with (acute) exacerbation: Secondary | ICD-10-CM | POA: Diagnosis not present

## 2023-06-29 DIAGNOSIS — J189 Pneumonia, unspecified organism: Secondary | ICD-10-CM | POA: Diagnosis not present

## 2023-06-29 DIAGNOSIS — J206 Acute bronchitis due to rhinovirus: Secondary | ICD-10-CM | POA: Diagnosis not present

## 2023-06-29 DIAGNOSIS — J9621 Acute and chronic respiratory failure with hypoxia: Secondary | ICD-10-CM | POA: Diagnosis not present

## 2023-06-29 LAB — BASIC METABOLIC PANEL WITH GFR
Anion gap: 4 — ABNORMAL LOW (ref 5–15)
BUN: 28 mg/dL — ABNORMAL HIGH (ref 8–23)
CO2: 32 mmol/L (ref 22–32)
Calcium: 8.6 mg/dL — ABNORMAL LOW (ref 8.9–10.3)
Chloride: 101 mmol/L (ref 98–111)
Creatinine, Ser: 0.57 mg/dL (ref 0.44–1.00)
GFR, Estimated: >60 mL/min (ref >60)
Glucose, Bld: 120 mg/dL — ABNORMAL HIGH (ref 70–99)
Potassium: 4.6 mmol/L (ref 3.5–5.1)
Sodium: 137 mmol/L (ref 135–145)

## 2023-06-29 LAB — CBC
HCT: 38.6 % (ref 36.0–46.0)
Hemoglobin: 12.6 g/dL (ref 12.0–15.0)
MCH: 30.1 pg (ref 26.0–34.0)
MCHC: 32.6 g/dL (ref 30.0–36.0)
MCV: 92.3 fL (ref 80.0–100.0)
Platelets: 169 10*3/uL (ref 150–400)
RBC: 4.18 MIL/uL (ref 3.87–5.11)
RDW: 14.2 % (ref 11.5–15.5)
WBC: 8 10*3/uL (ref 4.0–10.5)
nRBC: 0 % (ref 0.0–0.2)

## 2023-06-29 NOTE — Plan of Care (Signed)
  Problem: Education: Goal: Knowledge of General Education information will improve Description: Including pain rating scale, medication(s)/side effects and non-pharmacologic comfort measures Outcome: Progressing   Problem: Health Behavior/Discharge Planning: Goal: Ability to manage health-related needs will improve Outcome: Progressing   Problem: Clinical Measurements: Goal: Respiratory complications will improve Outcome: Progressing Goal: Cardiovascular complication will be avoided Outcome: Progressing   Problem: Activity: Goal: Risk for activity intolerance will decrease Outcome: Progressing   Problem: Nutrition: Goal: Adequate nutrition will be maintained Outcome: Progressing   Problem: Coping: Goal: Level of anxiety will decrease Outcome: Progressing   Problem: Elimination: Goal: Will not experience complications related to urinary retention Outcome: Progressing   Problem: Pain Managment: Goal: General experience of comfort will improve and/or be controlled Outcome: Progressing   Problem: Safety: Goal: Ability to remain free from injury will improve Outcome: Progressing   Problem: Skin Integrity: Goal: Risk for impaired skin integrity will decrease Outcome: Progressing

## 2023-06-29 NOTE — Progress Notes (Signed)
   06/29/23 1944  BiPAP/CPAP/SIPAP  BiPAP/CPAP/SIPAP Pt Type Adult  BiPAP/CPAP/SIPAP SERVO  BiPAP/CPAP /SiPAP Vitals  Pulse Rate 92  Resp 16   Pt has PRN BIPAP orders, no distress noted at this time

## 2023-06-29 NOTE — Progress Notes (Signed)
 SATURATION QUALIFICATIONS: (This note is used to comply with regulatory documentation for home oxygen)  Patient Saturations on Room Air at Rest = 87%  Patient Saturations on Room Air while Ambulating = unable  Patient Saturations on 3 Liters of oxygen while Ambulating = 92%  Please briefly explain why patient needs home oxygen: Pt requires supplemental O2 to maintain O2 sats at a safe level to manage ADLs/mobility.

## 2023-06-29 NOTE — Progress Notes (Signed)
                 Subjective SOB continues to improve. Denies chest pain/palpitations.  Physical exam Blood pressure (!) 122/34, pulse 85, temperature 97.9 F (36.6 C), temperature source Oral, resp. rate 14, height 5\' 3"  (1.6 m), weight 52.1 kg, SpO2 93%.  Constitutional: frail-appearing, lying in bed, in no acute distress Cardiovascular: regular rate and rhythm, no m/r/g, no LEE Pulmonary/Chest: normal work of breathing on 3 L [place], diminished breath sounds bilaterally, no wheezing/crackles Abdominal: soft, non-tender, non-distended Neurological: alert & oriented Skin: warm and dry Psych: normal mood and behavior   Weight change:    Intake/Output Summary (Last 24 hours) at 06/29/2023 1316 Last data filed at 06/29/2023 0640 Gross per 24 hour  Intake --  Output 100 ml  Net -100 ml   Net IO Since Admission: -729.87 mL [06/29/23 1316]  Labs, images, and other studies    Latest Ref Rng & Units 06/29/2023    1:28 AM 06/28/2023    4:35 AM 06/27/2023    3:44 AM  CBC  WBC 4.0 - 10.5 K/uL 8.0  8.1  7.5   Hemoglobin 12.0 - 15.0 g/dL 16.1  09.6  04.5   Hematocrit 36.0 - 46.0 % 38.6  40.1  40.7   Platelets 150 - 400 K/uL 169  169  154        Latest Ref Rng & Units 06/29/2023    1:28 AM 06/28/2023    4:35 AM 06/27/2023   11:52 AM  BMP  Glucose 70 - 99 mg/dL 409  91  97   BUN 8 - 23 mg/dL 28  27  27    Creatinine 0.44 - 1.00 mg/dL 8.11  9.14  7.82   Sodium 135 - 145 mmol/L 137  138  135   Potassium 3.5 - 5.1 mmol/L 4.6  4.8  4.9   Chloride 98 - 111 mmol/L 101  102  100   CO2 22 - 32 mmol/L 32  32  27   Calcium 8.9 - 10.3 mg/dL 8.6  8.5  8.8      Assessment and plan Hospital day 7  [name] is a 88 y.o.female with PMH of COPD, HFpEF, CHB s/p pacemaker, severe aortic stenosis s/p TAVR, HTN, GAD, and protein-calorie malnutrition and is presenting with an acute on chronic respiratory failure with hypoxia 2/2 COPD exacerbation   Acute on chronic hypercarbic respiratory failure  and acute hypoxemic respiratory failure due to COPD exacerbation related to presumed community-acquired pneumonia and rhinovirus Symptoms continue to improve and she did not require BiPAP last night. Labs are stable. She is requiring 3 L to maintain > 88% at rest and with ambulation as of today per PT note. She did not have O2 requirements at home prior to this admission. PT is also recommending SNF to which she is agreeable. I reached out to case management to get that process started.  -Last day of Solu-Medrol today and will transition to prednisone tomorrow  -Scheduled Nebs -Flutter valve/IS -Mucinex BID -BiPAP prn   HTN Stable. Continue home Norvasc.   Protein calorie malnutrition, mild Ensure  BID.  PT recommending SNF, placement pending   GAD Continue home Celexa. Atarax prn.   HLD Continue home Zocor  Carmina Miller, DO 06/29/2023, 1:16 PM  Pager: [phone] After 5pm or weekend: [phone]

## 2023-06-29 NOTE — Progress Notes (Signed)
 Physical Therapy Treatment Patient Details Name: [name] MRN: [id number] DOB: [birth year] Today's Date: 06/29/2023   History of Present Illness 88 yo F admitted 3/25 with SOB, PNA (seen in ED 3/24) and COPD exacerbation. 3/26 respiratory decline requiring bipap. PMhx: COPD, COVID Jan 2025,TAVR, PPM, GIB, endometrial CA, HTN, HLD, GAD    PT Comments  Pt greeted in recliner chair, daughter present, pleasant and agreeable to PT session. She ambulated ~63ft within the room using RW with CGA. Pt required 3L O2 via [place] to maintain SpO2 >88% during mobility with intermittent standing rest breaks and cues for PLB technique. She continues to demonstrated reduced cardiopulmonary endurance and reported an 8/10 on the modified RPE scale following gait. Educated pt on incentive spirometer use/technique and instructed her to complete 10 reps every hour alternating with the flutter device. Will continue to follow acutely and advance appropriately.     If plan is discharge home, recommend the following: A little help with walking and/or transfers;A lot of help with bathing/dressing/bathroom;Assist for transportation;Help with stairs or ramp for entrance;Assistance with cooking/housework   Can travel by private vehicle     Yes  Equipment Recommendations  None recommended by PT (Pt already has DME)    Recommendations for Other Services       Precautions / Restrictions Precautions Precautions: Fall Recall of Precautions/Restrictions: Intact Precaution/Restrictions Comments: Watch O2 Restrictions Weight Bearing Restrictions Per Provider Order: No     Mobility  Bed Mobility               General bed mobility comments: Not assessed. Pt greeted in recliner chair and returned there following session.    Transfers Overall transfer level: Needs assistance Equipment used: Rolling walker (2 wheels) Transfers: Sit to/from Stand Sit to Stand: Contact guard assist           General transfer  comment: Pt stood from recliner chair with CGA to power up. VC for hand positioning and technique using RW. Poor eccentric control, pt plopped down in chair once returning to recliner.    Ambulation/Gait Ambulation/Gait assistance: Contact guard assist Gait Distance (Feet): 25 Feet (Pt stayed in her room. She declined to go into the hallway d/t the requirement to don a mask secondary to droplet/contact precautions and pt reporting "it would smother me.") Assistive device: Rolling walker (2 wheels) Gait Pattern/deviations: Step-through pattern, Decreased stride length, Narrow base of support Gait velocity: reduced     General Gait Details: Pt ambulated with slow cautious steps. She demonstrated even weight shift, upright posture, and adequate foot clearence. VC for sequencing using RW and to help navigating obstacles within the room. Pt required intermittent standing rest breaks with cues for PLB to maintain SpO2 >88%.   Stairs             Wheelchair Mobility     Tilt Bed    Modified Rankin (Stroke Patients Only)       Balance Overall balance assessment: Mild deficits observed, not formally tested                                          Communication Communication Communication: Impaired Factors Affecting Communication: Hearing impaired (pt wearing hearing aids)  Cognition Arousal: Alert Behavior During Therapy: WFL for tasks assessed/performed   PT - Cognitive impairments: No apparent impairments  Following commands: Intact      Cueing Cueing Techniques: Verbal cues  Exercises      General Comments General comments (skin integrity, edema, etc.): BP 140/43 (71). Pt greeted on 3L O2. Attempted to wean as pt isn't on oxygen at baseline. She tolerated 2L at rest with SpO2 89-90%, but desaturated during gait to 87%. She required 3L to maintain SpO2 >88%, intermittent standing rest breaks, and cues for PLB technique.  Pt reported an 8/10 on the modified RPE scale following gait. Educated pt on incentive spirometer use  and instructed her to complete 10 reps every hour alternating with the flutter machine. Encouraged pt to eat all meals in recliner chair and ambulate multiple times a day with the help of nursing/mobility.      Pertinent Vitals/Pain Pain Assessment Pain Assessment: No/denies pain    Home Living                          Prior Function            PT Goals (current goals can now be found in the care plan section) Acute Rehab PT Goals Patient Stated Goal: Return Home Progress towards PT goals: Progressing toward goals    Frequency    Min 2X/week      PT Plan      Co-evaluation              AM-PAC PT "6 Clicks" Mobility   Outcome Measure  Help needed turning from your back to your side while in a flat bed without using bedrails?: A Little Help needed moving from lying on your back to sitting on the side of a flat bed without using bedrails?: A Little Help needed moving to and from a bed to a chair (including a wheelchair)?: A Little Help needed standing up from a chair using your arms (e.g., wheelchair or bedside chair)?: A Little Help needed to walk in hospital room?: A Little Help needed climbing 3-5 steps with a railing? : Total 6 Click Score: 16    End of Session Equipment Utilized During Treatment: Gait belt;Oxygen Activity Tolerance: Patient tolerated treatment well Patient left: in chair;with call bell/phone within reach;with chair alarm set;with family/visitor present (tray table with lunch set up in front of her) Nurse Communication: Mobility status PT Visit Diagnosis: Other abnormalities of gait and mobility (R26.89);Muscle weakness (generalized) (M62.81)     Time: 1137-1206 PT Time Calculation (min) (ACUTE ONLY): 29 min  Charges:    $Gait Training: 23-37 mins PT General Charges $$ ACUTE PT VISIT: 1 Visit                     Cheri Guppy,  PT, DPT Acute Rehabilitation Services Office: [phone] Secure Chat Preferred  Richardson Chiquito 06/29/2023, 12:47 PM

## 2023-06-29 NOTE — TOC Initial Note (Signed)
 Transition of Care [facility]) - Initial/Assessment Note    Patient Details  Name: [name] MRN: [id number] Date of Birth: 1936-02-28  Transition of Care [facility] CM/SW Contact:    Eduard Roux, LCSW Phone Number: 06/29/2023, 5:36 PM  Clinical Narrative:                  CSW met with patient and her niece, Misty Stanley. CSW introduced self and explained role. CSW discuss with patient and family recommendation for short term rehab at [facility]. Patient reports she lives home alone. Patient states although, she wants to go home she acknowledges SNF would be the best option at this time. CSW explained the SNF process and insurance. Preferred SNF is [facility] in [place].   TOC will provide bed offers once available.   Antony Blackbird, MSW, LCSW Clinical Social Worker    Expected Discharge Plan: Skilled Nursing Facility Barriers to Discharge: Insurance Authorization   Patient Goals and CMS Choice            Expected Discharge Plan and Services In-house Referral: Clinical Social Work     Living arrangements for the past 2 months: Single Family Home                                      Prior Living Arrangements/Services Living arrangements for the past 2 months: Single Family Home Lives with:: Self Patient language and need for interpreter reviewed:: No        Need for Family Participation in Patient Care: Yes (Comment)     Criminal Activity/Legal Involvement Pertinent to Current Situation/Hospitalization: No - Comment as needed  Activities of Daily Living   ADL Screening (condition at time of admission) Independently performs ADLs?: No Does the patient have a NEW difficulty with bathing/dressing/toileting/self-feeding that is expected to last >3 days?: No (needs assist) Does the patient have a NEW difficulty with getting in/out of bed, walking, or climbing stairs that is expected to last >3 days?: No (needs assist) Does the patient have a NEW difficulty with  communication that is expected to last >3 days?: No Is the patient deaf or have difficulty hearing?: Yes Does the patient have difficulty seeing, even when wearing glasses/contacts?: No Does the patient have difficulty concentrating, remembering, or making decisions?: No  Permission Sought/Granted Permission sought to share information with : Family Supports Permission granted to share information with : Yes, Verbal Permission Granted  Share Information with NAME: Tod Persia  Permission granted to share info w AGENCY: SNFs  Permission granted to share info w Relationship: Niece  Permission granted to share info w Contact Information: [phone]  Emotional Assessment Appearance:: Appears stated age Attitude/Demeanor/Rapport: Engaged   Orientation: : Oriented to Self, Oriented to Place, Oriented to  Time, Oriented to Situation Alcohol / Substance Use: Not Applicable Psych Involvement: No (comment)  Admission diagnosis:  COPD exacerbation (HCC) [J44.1] Community acquired pneumonia of right lower lobe of lung [J18.9] Patient Active Problem List   Diagnosis Date Noted   Acute bronchitis due to Rhinovirus 06/29/2023   Protein-calorie malnutrition, severe 06/24/2023   COPD exacerbation (HCC) 06/23/2023   Community acquired pneumonia of right lower lobe of lung 06/23/2023   Protein-calorie malnutrition, mild (HCC) 06/23/2023   Acute on chronic respiratory failure with hypoxia (HCC) 06/23/2023   Full incontinence of feces 01/22/2023   Sacroiliac joint dysfunction 01/22/2023   Hypoxemia 12/31/2022   Frequency of urination  08/14/2022   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   NSVT (nonsustained ventricular tachycardia) (HCC) 01/06/2022   Conductive hearing loss, bilateral 09/12/2021   Generalized anxiety disorder 08/11/2021   Tobacco use 10/18/2020   COPD (chronic obstructive pulmonary disease) (HCC) 10/18/2020   Pacemaker-MDT 09/13/2020   Generalized weakness 07/04/2020   Dark stools  07/04/2020   DOE (dyspnea on exertion) 07/04/2020   Duodenal ulcer due to Helicobacter pylori 06/19/2020   Acute blood loss anemia 06/18/2020   HTN (hypertension) 06/18/2020   HLD (hyperlipidemia) 06/18/2020   Carotid arterial disease (HCC) 06/18/2020   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   Severe aortic valve stenosis    Complete heart block (HCC) 10/20/2018   Endometrial cancer (HCC) 06/12/2011   PCP:  Sandre Kitty, MD Pharmacy:   [facility] [address] [place], [place] - [address] [place] [address] Phone: [phone] Fax: [phone]     Social Drivers of Health (SDOH) Social History: SDOH Screenings   Food Insecurity: No Food Insecurity (06/23/2023)  Housing: Low Risk  (06/23/2023)  Transportation Needs: No Transportation Needs (06/23/2023)  Utilities: Not At Risk (06/23/2023)  Alcohol Screen: Low Risk  (08/05/2022)  Depression (PHQ2-9): Low Risk  (01/22/2023)  Financial Resource Strain: Low Risk  (08/05/2022)  Physical Activity: Inactive (08/05/2022)  Social Connections: Socially Isolated (06/23/2023)  Stress: No Stress Concern Present (08/05/2022)  Tobacco Use: High Risk (06/23/2023)   SDOH Interventions:     Readmission Risk Interventions     No data to display

## 2023-06-29 NOTE — Progress Notes (Signed)
 Occupational Therapy Treatment Patient Details Name: [name] MRN: [id number] DOB: 07-07-1935 Today's Date: 06/29/2023   History of present illness 88 yo F admitted 3/25 with SOB, PNA (seen in ED 3/24) and COPD exacerbation. 3/26 respiratory decline requiring bipap. PMhx: COPD, COVID Jan 2025,TAVR, PPM, GIB, endometrial CA, HTN, HLD, GAD   OT comments  Pt with mild unsteadiness and required light physical assist to manage bed mobility, in-room mobility and ADLs in comparison to initial OT eval d/t reportedly not mobilizing since therapy evals. Overall, pt required Min A for bed mobility, CGA for handheld assist mobility in room and set up in chair for breathing treatment and breakfast. SpO2 fluctuating, more so at rest than with activity today. Required 3 L O2 to  maintain sats 92% with activity, 1/4 DOE noted. Continue to aim for DC home with HHOT.       If plan is discharge home, recommend the following:  Assistance with cooking/housework;Assist for transportation   Equipment Recommendations  Other (comment) (TBD pending progress)    Recommendations for Other Services      Precautions / Restrictions Precautions Precautions: Fall;Other (comment) Recall of Precautions/Restrictions: Intact Precaution/Restrictions Comments: watch O2 (does not wear at baseline) Restrictions Weight Bearing Restrictions Per Provider Order: No       Mobility Bed Mobility Overal bed mobility: Needs Assistance Bed Mobility: Supine to Sit     Supine to sit: Min assist     General bed mobility comments: Min A to scoot to EOB    Transfers Overall transfer level: Needs assistance Equipment used: None Transfers: Sit to/from Stand Sit to Stand: Contact guard assist           General transfer comment: increased effort/time to stand from bedside     Balance Overall balance assessment: Mild deficits observed, not formally tested                                          ADL either performed or assessed with clinical judgement   ADL Overall ADL's : Needs assistance/impaired Eating/Feeding: Independent                   Lower Body Dressing: Supervision/safety;Sitting/lateral leans;Sit to/from stand               Functional mobility during ADLs: Contact guard assist General ADL Comments: Assessed O2 with short in room mobility, some unsteadiness noted today compared to initial eval though pt reports not having been up in a couple of days. Provided handheld assist to walk around bed to chair though would also do well with RW. Pt required assist to don hearing aides due to difficulty with O2 tubing behind ear as well.    Extremity/Trunk Assessment Upper Extremity Assessment Upper Extremity Assessment: Generalized weakness;Right hand dominant   Lower Extremity Assessment Lower Extremity Assessment: Defer to PT evaluation        Vision   Vision Assessment?: Wears glasses for reading   Perception     Praxis     Communication Communication Communication: Impaired Factors Affecting Communication: Hearing impaired;Other (comment) (has hearing aides)   Cognition Arousal: Alert Behavior During Therapy: WFL for tasks assessed/performed Cognition: No apparent impairments                               Following commands: Intact  Cueing   Cueing Techniques: Verbal cues, Gestural cues  Exercises      Shoulder Instructions       General Comments      Pertinent Vitals/ Pain       Pain Assessment Pain Assessment: No/denies pain  Home Living                                          Prior Functioning/Environment              Frequency  Min 2X/week        Progress Toward Goals  OT Goals(current goals can now be found in the care plan section)  Progress towards OT goals: Progressing toward goals  Acute Rehab OT Goals Patient Stated Goal: improve O2 OT Goal Formulation: With  patient Time For Goal Achievement: 07/09/23 Potential to Achieve Goals: Good ADL Goals Pt Will Transfer to Toilet: with modified independence;ambulating Additional ADL Goal #1: Pt to implement at least 3 energy conservation strategies during ADLs, IADLs and mobility Additional ADL Goal #2: Pt to increase standing activity tolerance > 10 min during ADLs, IADLs and mobility without seated rest break  Plan      Co-evaluation                 AM-PAC OT "6 Clicks" Daily Activity     Outcome Measure   Help from another person eating meals?: None Help from another person taking care of personal grooming?: A Little Help from another person toileting, which includes using toliet, bedpan, or urinal?: A Little Help from another person bathing (including washing, rinsing, drying)?: A Little Help from another person to put on and taking off regular upper body clothing?: A Little Help from another person to put on and taking off regular lower body clothing?: A Little 6 Click Score: 19    End of Session Equipment Utilized During Treatment: Oxygen  OT Visit Diagnosis: Muscle weakness (generalized) (M62.81)   Activity Tolerance Patient tolerated treatment well   Patient Left in chair;with call bell/phone within reach;with chair alarm set;Other (comment) (RT)   Nurse Communication Mobility status        Time: 1610-9604 OT Time Calculation (min): 20 min  Charges: OT General Charges $OT Visit: 1 Visit OT Treatments $Therapeutic Activity: 8-22 mins  Bradd Canary, OTR/L Acute Rehab Services Office: [phone]   Lorre Munroe 06/29/2023, 8:43 AM

## 2023-06-30 DIAGNOSIS — J189 Pneumonia, unspecified organism: Secondary | ICD-10-CM | POA: Diagnosis not present

## 2023-06-30 DIAGNOSIS — J441 Chronic obstructive pulmonary disease with (acute) exacerbation: Secondary | ICD-10-CM | POA: Diagnosis not present

## 2023-06-30 DIAGNOSIS — J206 Acute bronchitis due to rhinovirus: Secondary | ICD-10-CM | POA: Diagnosis not present

## 2023-06-30 DIAGNOSIS — J9621 Acute and chronic respiratory failure with hypoxia: Secondary | ICD-10-CM | POA: Diagnosis not present

## 2023-06-30 MED ORDER — SENNA 8.6 MG PO TABS: 2.0000 | ORAL_TABLET | Freq: Every day | ORAL | Status: DC

## 2023-06-30 MED ORDER — POLYETHYLENE GLYCOL 3350 17 G PO PACK
17.0000 g | PACK | Freq: Two times a day (BID) | ORAL | Status: DC
  Filled 2023-06-30: qty 1

## 2023-06-30 NOTE — TOC Progression Note (Signed)
 Transition of Care [facility]) - Progression Note    Patient Details  Name: [name] MRN: [id number] Date of Birth: 1935-12-02  Transition of Care [facility]) CM/SW Contact  Eduard Roux, [place] Phone Number: 06/30/2023, 12:11 PM  Clinical Narrative:     CSW  spoke with patient's niece, Misty Stanley, Informed [facility] was unable to offer. She requested to leave list of other facilities in the room with the patient. She will review and call CSW back with SNF choice.   TOC will continue to follow and assist with discharge planning.  Antony Blackbird, MSW, LCSW Clinical Social Worker    Expected Discharge Plan: Skilled Nursing Facility Barriers to Discharge: Insurance Authorization  Expected Discharge Plan and Services In-house Referral: Clinical Social Work     Living arrangements for the past 2 months: Single Family Home                                       Social Determinants of Health (SDOH) Interventions SDOH Screenings   Food Insecurity: No Food Insecurity (06/23/2023)  Housing: Low Risk  (06/23/2023)  Transportation Needs: No Transportation Needs (06/23/2023)  Utilities: Not At Risk (06/23/2023)  Alcohol Screen: Low Risk  (08/05/2022)  Depression (PHQ2-9): Low Risk  (01/22/2023)  Financial Resource Strain: Low Risk  (08/05/2022)  Physical Activity: Inactive (08/05/2022)  Social Connections: Socially Isolated (06/23/2023)  Stress: No Stress Concern Present (08/05/2022)  Tobacco Use: High Risk (06/23/2023)    Readmission Risk Interventions     No data to display

## 2023-06-30 NOTE — TOC Progression Note (Signed)
 Transition of Care [facility]) - Progression Note    Patient Details  Name: [name] MRN: [id number] Date of Birth: August 30, 1935  Transition of Care [facility]) CM/SW Contact  Eduard Roux, [place] Phone Number: 06/30/2023, 1:43 PM  Clinical Narrative:     Contacted HTA, started insurance authorization for SNF & PTAR   [facility] confirmed availability -  TOC will continue to follow and assist with discharge planning.  Antony Blackbird, MSW, LCSW Clinical Social Worker     Expected Discharge Plan: Skilled Nursing Facility Barriers to Discharge: Insurance Authorization  Expected Discharge Plan and Services In-house Referral: Clinical Social Work     Living arrangements for the past 2 months: Single Family Home                                       Social Determinants of Health (SDOH) Interventions SDOH Screenings   Food Insecurity: No Food Insecurity (06/23/2023)  Housing: Low Risk  (06/23/2023)  Transportation Needs: No Transportation Needs (06/23/2023)  Utilities: Not At Risk (06/23/2023)  Alcohol Screen: Low Risk  (08/05/2022)  Depression (PHQ2-9): Low Risk  (01/22/2023)  Financial Resource Strain: Low Risk  (08/05/2022)  Physical Activity: Inactive (08/05/2022)  Social Connections: Socially Isolated (06/23/2023)  Stress: No Stress Concern Present (08/05/2022)  Tobacco Use: High Risk (06/23/2023)    Readmission Risk Interventions     No data to display

## 2023-06-30 NOTE — Plan of Care (Signed)
   Problem: Clinical Measurements: Goal: Respiratory complications will improve Outcome: Progressing

## 2023-06-30 NOTE — Progress Notes (Signed)
 Mobility Specialist Progress Note:    06/30/23 1005  Mobility  Activity Ambulated with assistance in room  Level of Assistance Contact guard assist, steadying assist  Assistive Device Front wheel walker  Distance Ambulated (ft) 50 ft  Activity Response Tolerated well  Mobility Referral Yes  Mobility visit 1 Mobility  Mobility Specialist Start Time (ACUTE ONLY) 0955  Mobility Specialist Stop Time (ACUTE ONLY) 1005  Mobility Specialist Time Calculation (min) (ACUTE ONLY) 10 min   Pt received in bed, agreeable to ambulate in room with RW and MinG. Tolerated well, VSS throughout, SpO2 WFL on 2L. Pt agreeable to sit up in chair. Pt had coughing spell, SpO2 84% on 2L, recovered, SpO2 97% on 2L. Left pt in chair with alarm on, call bell and phone in reach. All needs met.   Feliciana Rossetti Mobility Specialist Please contact via Special educational needs teacher or  Rehab office at [phone]

## 2023-06-30 NOTE — Plan of Care (Signed)

## 2023-06-30 NOTE — Progress Notes (Signed)
                 Subjective SOB continues to improve  Physical exam Blood pressure (!) 147/37, pulse 71, temperature 98.5 F (36.9 C), temperature source Oral, resp. rate 17, height 5\' 3"  (1.6 m), weight 52 kg, SpO2 97%.  Constitutional: frail-appearing, lying in bed, in no acute distress Cardiovascular: regular rate and rhythm, no m/r/g, no LEE Pulmonary/Chest: normal work of breathing on 2 L [place], diminished breath sounds bilaterally, no wheezing/crackles Abdominal: soft, non-tender, non-distended Neurological: alert & oriented Skin: warm and dry Psych: normal mood and behavior   Weight change:    Intake/Output Summary (Last 24 hours) at 06/30/2023 1051 Last data filed at 06/30/2023 0500 Gross per 24 hour  Intake 360 ml  Output 1900 ml  Net -1540 ml   Net IO Since Admission: -2,269.87 mL [06/30/23 1051]  Labs, images, and other studies    Latest Ref Rng & Units 06/29/2023    1:28 AM 06/28/2023    4:35 AM 06/27/2023    3:44 AM  CBC  WBC 4.0 - 10.5 K/uL 8.0  8.1  7.5   Hemoglobin 12.0 - 15.0 g/dL 16.1  09.6  04.5   Hematocrit 36.0 - 46.0 % 38.6  40.1  40.7   Platelets 150 - 400 K/uL 169  169  154        Latest Ref Rng & Units 06/29/2023    1:28 AM 06/28/2023    4:35 AM 06/27/2023   11:52 AM  BMP  Glucose 70 - 99 mg/dL 409  91  97   BUN 8 - 23 mg/dL 28  27  27    Creatinine 0.44 - 1.00 mg/dL 8.11  9.14  7.82   Sodium 135 - 145 mmol/L 137  138  135   Potassium 3.5 - 5.1 mmol/L 4.6  4.8  4.9   Chloride 98 - 111 mmol/L 101  102  100   CO2 22 - 32 mmol/L 32  32  27   Calcium 8.9 - 10.3 mg/dL 8.6  8.5  8.8      Assessment and plan Hospital day 8  [name] is a 88 y.o.female with PMH of COPD, HFpEF, CHB s/p pacemaker, severe aortic stenosis s/p TAVR, HTN, GAD, and protein-calorie malnutrition and is presenting with an acute on chronic respiratory failure with hypoxia 2/2 COPD exacerbation.  Acute on chronic hypercarbic respiratory failure and acute hypoxemic  respiratory failure due to COPD exacerbation related to presumed community-acquired pneumonia and rhinovirus Symptoms continue to improve and she did not require BiPAP last night which is now two nights in a row without it. She is stable for discharge, just awaiting SNF placement. -Start Prednisone 20 mg with plan to give for 3 days -Scheduled Nebs -Flutter valve/IS -Mucinex BID -BiPAP prn   HTN Stable. Continue home Norvasc.   Protein calorie malnutrition, mild Ensure BID.  PT recommending SNF, placement pending  Constipation Miralax and Senokot   GAD Continue home Celexa. Atarax prn.   HLD Continue home Zocor  Gina Miller, DO 06/30/2023, 10:51 AM  Pager: [phone] After 5pm or weekend: [phone]

## 2023-06-30 NOTE — Progress Notes (Signed)
   06/30/23 1931  BiPAP/CPAP/SIPAP  BiPAP/CPAP/SIPAP Pt Type Adult  Reason BIPAP/CPAP not in use Non-compliant  BiPAP/CPAP /SiPAP Vitals  Pulse Rate 96  Resp 18  SpO2 95 %  Bilateral Breath Sounds Diminished  MEWS Score/Color  MEWS Score 0  MEWS Score Color [name]

## 2023-07-01 DIAGNOSIS — J206 Acute bronchitis due to rhinovirus: Secondary | ICD-10-CM | POA: Diagnosis not present

## 2023-07-01 DIAGNOSIS — J9621 Acute and chronic respiratory failure with hypoxia: Secondary | ICD-10-CM | POA: Diagnosis not present

## 2023-07-01 DIAGNOSIS — J189 Pneumonia, unspecified organism: Secondary | ICD-10-CM | POA: Diagnosis not present

## 2023-07-01 DIAGNOSIS — J441 Chronic obstructive pulmonary disease with (acute) exacerbation: Secondary | ICD-10-CM | POA: Diagnosis not present

## 2023-07-01 NOTE — Progress Notes (Signed)
 Physical Therapy Treatment Patient Details Name: [name] MRN: [id number] DOB: [birth year] Today's Date: 07/01/2023   History of Present Illness 88 yo F admitted 3/25 with SOB, PNA (seen in ED 3/24) and COPD exacerbation. 3/26 respiratory decline requiring bipap. PMhx: COPD, COVID Jan 2025,TAVR, PPM, GIB, endometrial CA, HTN, HLD, GAD    PT Comments  Pt resting in bed on arrival and agreeable to session with good progress towards acute goals. Pt continues to be limited in safe mobility by decreased cardiopulmonary endurance, decreased activity tolerance and poor balance/postural reactions, requiring cues for self pacing and energy conservation. Pt demonstrating bed mobility, transfers and gait with grossly supervision-CGA for safety and line management with gait distance limited by fatigue. SpO2 93-96% on 3L. Continued education on IS and flutter valve use with pt able to demonstrate both and verbalizing understanding. Patient will benefit from continued inpatient follow up therapy, <3 hours/day, will continue to follow acutely.    If plan is discharge home, recommend the following: A little help with walking and/or transfers;A lot of help with bathing/dressing/bathroom;Assist for transportation;Help with stairs or ramp for entrance;Assistance with cooking/housework   Can travel by private vehicle     Yes  Equipment Recommendations  None recommended by PT (Pt already has DME)    Recommendations for Other Services       Precautions / Restrictions Precautions Precautions: Fall Recall of Precautions/Restrictions: Intact Precaution/Restrictions Comments: Watch O2 Restrictions Weight Bearing Restrictions Per Provider Order: No     Mobility  Bed Mobility Overal bed mobility: Needs Assistance Bed Mobility: Supine to Sit     Supine to sit: Contact guard     General bed mobility comments: CGA for safety and line management    Transfers Overall transfer level: Needs  assistance Equipment used: Rolling walker (2 wheels) Transfers: Sit to/from Stand Sit to Stand: Contact guard assist           General transfer comment: CGA to stand from EOB, cues for hand placement    Ambulation/Gait Ambulation/Gait assistance: Contact guard assist Gait Distance (Feet): 125 Feet Assistive device: Rolling walker (2 wheels) Gait Pattern/deviations: Step-through pattern, Decreased stride length Gait velocity: reduced     General Gait Details: Pt ambulated with slow cautious steps. She demonstrated even weight shift, upright posture, and adequate foot clearence. SpO2 93-95% on 3L. Distance limited by fatigue   Stairs             Wheelchair Mobility     Tilt Bed    Modified Rankin (Stroke Patients Only)       Balance Overall balance assessment: Mild deficits observed, not formally tested                                          Communication Communication Communication: Impaired Factors Affecting Communication: Hearing impaired (pt wearing hearing aids)  Cognition Arousal: Alert Behavior During Therapy: WFL for tasks assessed/performed   PT - Cognitive impairments: No apparent impairments                         Following commands: Intact      Cueing Cueing Techniques: Verbal cues  Exercises      General Comments        Pertinent Vitals/Pain Pain Assessment Pain Assessment: No/denies pain    Home Living  Prior Function            PT Goals (current goals can now be found in the care plan section) Acute Rehab PT Goals Patient Stated Goal: Return Home PT Goal Formulation: With patient/family Time For Goal Achievement: 07/09/23 Progress towards PT goals: Progressing toward goals    Frequency    Min 2X/week      PT Plan      Co-evaluation              AM-PAC PT "6 Clicks" Mobility   Outcome Measure  Help needed turning from your back to your  side while in a flat bed without using bedrails?: A Little Help needed moving from lying on your back to sitting on the side of a flat bed without using bedrails?: A Little Help needed moving to and from a bed to a chair (including a wheelchair)?: A Little Help needed standing up from a chair using your arms (e.g., wheelchair or bedside chair)?: A Little Help needed to walk in hospital room?: A Little Help needed climbing 3-5 steps with a railing? : Total 6 Click Score: 16    End of Session Equipment Utilized During Treatment: Gait belt;Oxygen Activity Tolerance: Patient tolerated treatment well Patient left: in chair;with call bell/phone within reach;with chair alarm set;with family/visitor present (tray table with lunch set up in front of her) Nurse Communication: Mobility status PT Visit Diagnosis: Other abnormalities of gait and mobility (R26.89);Muscle weakness (generalized) (M62.81)     Time: 4098-1191 PT Time Calculation (min) (ACUTE ONLY): 32 min  Charges:    $Gait Training: 23-37 mins PT General Charges $$ ACUTE PT VISIT: 1 Visit                     Callaway Hailes R. PTA Acute Rehabilitation Services Office: [phone]   Catalina Antigua 07/01/2023, 9:58 AM

## 2023-07-01 NOTE — Progress Notes (Signed)
 Nutrition Follow-up  DOCUMENTATION CODES:   Severe malnutrition in context of chronic illness  INTERVENTION:  Continue regular diet as ordered Discontinue magic cup Mighty Shake TID with meals, each supplement provides 330 kcals and 9 grams of protein Ensure Enlive po BID between meals as tolerated, each supplement provides 350 kcal and 20 grams of protein. "High Calorie, High Protein Nutrition Therapy" handout added to AVS  NUTRITION DIAGNOSIS:   Severe Malnutrition related to chronic illness as evidenced by severe muscle depletion, severe fat depletion. - remains applicable  GOAL:   Patient will meet greater than or equal to 90% of their needs - unmet, addressing via meals and nutrition supplements  MONITOR:   PO intake, Supplement acceptance  REASON FOR ASSESSMENT:   Consult Assessment of nutrition requirement/status  ASSESSMENT:   PMH of CHF, COPD, Pacemaker, aortic valve replacement, ongoing tobacco use and HTN admitted for COPD exacerbation.  Pt stable for discharge.  TOC working on SNF placement.    Limited meal completions on file to review.  On 3/27 pt consumed 5/% breakfast, 100% lunch and 75% dinner Per MAR, intermittently not receiving nutrition supplements.   Pt sitting up in bedside recliner at time of visit. Pt reports that she is eating as best as she can though can only tolerate small amounts at a time. Mighty shake and Ensure on table. She reports that she is trying to consume the ensure but they are "heavy." She really enjoys the mighty shakes and is agreeable to receive these with all meals in place of magic cup as she does not like them.   Admit weight: 48 kg Current weight: 49.5 kg  Medications: prednisone, simvastatin  Labs:  BUN 28 Anion gap 4  Diet Order:   Diet Order             Diet regular Room service appropriate? Yes; Fluid consistency: Thin  Diet effective now                   EDUCATION NEEDS:   Education needs have  been addressed  Skin:  Skin Assessment: Reviewed RN Assessment  Last BM:  3/31  Height:   Ht Readings from Last 1 Encounters:  06/24/23 5\' 3"  (1.6 m)    Weight:   Wt Readings from Last 1 Encounters:  07/01/23 49.5 kg   BMI:  Body mass index is 19.33 kg/m.  Estimated Nutritional Needs:   Kcal:  1300-1500 kcal  Protein:  70-90 g  Fluid:  1 mL/kcal or per MD  [name], RDN, LDN Clinical Nutrition See AMiON for contact information.

## 2023-07-01 NOTE — Progress Notes (Signed)
                 Subjective SOB continues to improve. A little anxious about discharge but understanding that she is stable and medically ready.  Physical exam Blood pressure 120/65, pulse 83, temperature 98.5 F (36.9 C), temperature source Oral, resp. rate 20, height 5\' 3"  (1.6 m), weight 49.5 kg, SpO2 99%.  Constitutional: frail-appearing, lying in bed, in no acute distress Cardiovascular: regular rate and rhythm, no m/r/g, no LEE Pulmonary/Chest: normal work of breathing on 2 L [place], diminished breath sounds bilaterally (improving), bilateral rhonchi Abdominal: soft, non-tender, non-distended Neurological: alert & oriented Skin: warm and dry Psych: normal mood and behavior   Weight change: -2.5 kg   Intake/Output Summary (Last 24 hours) at 07/01/2023 1318 Last data filed at 07/01/2023 0354 Gross per 24 hour  Intake 240 ml  Output 800 ml  Net -560 ml   Net IO Since Admission: -2,829.87 mL [07/01/23 1318]  Labs, images, and other studies    Latest Ref Rng & Units 06/29/2023    1:28 AM 06/28/2023    4:35 AM 06/27/2023    3:44 AM  CBC  WBC 4.0 - 10.5 K/uL 8.0  8.1  7.5   Hemoglobin 12.0 - 15.0 g/dL 16.1  09.6  04.5   Hematocrit 36.0 - 46.0 % 38.6  40.1  40.7   Platelets 150 - 400 K/uL 169  169  154        Latest Ref Rng & Units 06/29/2023    1:28 AM 06/28/2023    4:35 AM 06/27/2023   11:52 AM  BMP  Glucose 70 - 99 mg/dL 409  91  97   BUN 8 - 23 mg/dL 28  27  27    Creatinine 0.44 - 1.00 mg/dL 8.11  9.14  7.82   Sodium 135 - 145 mmol/L 137  138  135   Potassium 3.5 - 5.1 mmol/L 4.6  4.8  4.9   Chloride 98 - 111 mmol/L 101  102  100   CO2 22 - 32 mmol/L 32  32  27   Calcium 8.9 - 10.3 mg/dL 8.6  8.5  8.8      Assessment and plan Hospital day 9  Mickayla Trouten Ditullio is a 88 y.o.female with PMH of COPD, HFpEF, CHB s/p pacemaker, severe aortic stenosis s/p TAVR, HTN, GAD, and protein-calorie malnutrition and is presenting with an acute on chronic respiratory failure with hypoxia  2/2 COPD exacerbation.   Acute on chronic hypercarbic respiratory failure and acute hypoxemic respiratory failure due to COPD exacerbation related to presumed community-acquired pneumonia and rhinovirus No BiPAP for the last three nights. She is stable for discharge, just awaiting SNF placement. -Continue Prednisone 20 mg (planning to end tomorrow) -Scheduled Nebs -Flutter valve/IS -Mucinex BID -BiPAP prn   HTN Stable. Continue home Norvasc   Protein calorie malnutrition, mild Ensure BID.  PT recommending SNF, placement pending   Constipation Resolved   GAD Continue home Celexa. Atarax prn.   HLD Continue home Zocor  Carmina Miller, DO 07/01/2023, 1:18 PM  After 5pm or weekend: [phone]

## 2023-07-01 NOTE — Plan of Care (Signed)
   Problem: Clinical Measurements: Goal: Will remain free from infection Outcome: Progressing Goal: Diagnostic test results will improve Outcome: Progressing

## 2023-07-01 NOTE — Progress Notes (Signed)
 Mobility Specialist Progress Note:    07/01/23 1000  Mobility  Activity Transferred to/from [facility]  Level of Assistance Contact guard assist, steadying assist  Assistive Device Front wheel walker;BSC  Distance Ambulated (ft) 5 ft  Activity Response Tolerated well  Mobility Referral Yes  Mobility visit 1 Mobility  Mobility Specialist Start Time (ACUTE ONLY) 1000  Mobility Specialist Stop Time (ACUTE ONLY) 1006  Mobility Specialist Time Calculation (min) (ACUTE ONLY) 6 min   Pt received in chair, requesting assistance to transfer to [facility]. MinG to transfer via RW. Tolerated well, void successful.  SpO2 94% on 3L during session. HR 90 bpm. Pt left sitting comfortably in chair. RN notified. All needs met.   Feliciana Rossetti Mobility Specialist Please contact via Special educational needs teacher or  Rehab office at [phone]

## 2023-07-01 NOTE — TOC Progression Note (Signed)
 Transition of Care [facility]) - Progression Note    Patient Details  Name: [name] MRN: [id number] Date of Birth: [birth year]  [facility] CM/SW Contact  Eduard Roux, [place] Phone Number: 07/01/2023, 10:32 AM  Clinical Narrative:     Insurance still pending for SNF authorization  Antony Blackbird, MSW, LCSW Clinical Social Worker    Expected Discharge Plan: Skilled Nursing Facility Barriers to Discharge: Insurance Authorization  Expected Discharge Plan and Services In-house Referral: Clinical Social Work     Living arrangements for the past 2 months: Single Family Home                                       Social Determinants of Health (SDOH) Interventions SDOH Screenings   Food Insecurity: No Food Insecurity (06/23/2023)  Housing: Low Risk  (06/23/2023)  Transportation Needs: No Transportation Needs (06/23/2023)  Utilities: Not At Risk (06/23/2023)  Alcohol Screen: Low Risk  (08/05/2022)  Depression (PHQ2-9): Low Risk  (01/22/2023)  Financial Resource Strain: Low Risk  (08/05/2022)  Physical Activity: Inactive (08/05/2022)  Social Connections: Socially Isolated (06/23/2023)  Stress: No Stress Concern Present (08/05/2022)  Tobacco Use: High Risk (06/23/2023)    Readmission Risk Interventions     No data to display

## 2023-07-01 NOTE — TOC Progression Note (Signed)
 Transition of Care [facility]) - Progression Note    Patient Details  Name: [name] MRN: [id number] Date of Birth: Nov 04, 1935  Transition of Care [facility]) CM/SW Contact  Eduard Roux, [place] Phone Number: 07/01/2023, 4:18 PM  Clinical Narrative:     Peer to peer was requested by HTA-   MD made aware of Peer to Peer requested and advised it was completed around 2:45pm  TOC is waiting on determination of Peer to Peer.  TOC will continue to follow and assist with discharge planning.  Antony Blackbird, MSW, LCSW Clinical Social Worker    Expected Discharge Plan: Skilled Nursing Facility Barriers to Discharge: Insurance Authorization  Expected Discharge Plan and Services In-house Referral: Clinical Social Work     Living arrangements for the past 2 months: Single Family Home                                       Social Determinants of Health (SDOH) Interventions SDOH Screenings   Food Insecurity: No Food Insecurity (06/23/2023)  Housing: Low Risk  (06/23/2023)  Transportation Needs: No Transportation Needs (06/23/2023)  Utilities: Not At Risk (06/23/2023)  Alcohol Screen: Low Risk  (08/05/2022)  Depression (PHQ2-9): Low Risk  (01/22/2023)  Financial Resource Strain: Low Risk  (08/05/2022)  Physical Activity: Inactive (08/05/2022)  Social Connections: Socially Isolated (06/23/2023)  Stress: No Stress Concern Present (08/05/2022)  Tobacco Use: High Risk (06/23/2023)    Readmission Risk Interventions     No data to display

## 2023-07-01 NOTE — Discharge Instructions (Addendum)
 Nutrition Post Hospital Stay Proper nutrition can help your body recover from illness and injury.   Foods and beverages high in protein, vitamins, and minerals help rebuild muscle loss, promote healing, & reduce fall risk.   In addition to eating healthy foods, a nutrition shake is an easy, delicious way to get the nutrition you need during and after your hospital stay  It is recommended that you continue to drink 2 bottles per day of: Mighty Shakes for at least 1 month (30 days) after your hospital stay   Tips for adding a nutrition shake into your routine: As allowed, drink one with vitamins or medications instead of water or juice Enjoy one as a tasty mid-morning or afternoon snack Drink cold or make a milkshake out of it Drink one instead of milk with cereal or snacks Use as a coffee creamer   Available at the following grocery stores and pharmacies:           * Karin Golden * [employer] * [employer]co  * R[employer]          * Walmart * [employer]  * Wal[facility]reens      * Target  * BJ's   * [facility]  * [employer]   * [facility] [phone]            For COUPONS visit: www.ensure.com/join or RoleLink.com.br   Suggested Substitutions Ensure Plus = Boost Plus = Carnation Breakfast Essentials = Boost Compact Ensure Active Clear = Boost Breeze Glucerna Shake = Boost Glucose Control = Carnation Breakfast Essentials SUGAR FREE  How to purchase Mighty Shakes: Visit www.hormelhealthlabs.com Select brands from the drop down menu Select Mighty Shakes   High-Calorie, High-Protein Nutrition Therapy (2021)  A high-calorie, high-protein diet has been recommended to you. Your registered dietitian nutritionist (RDN) may have recommended this diet because you are having difficulty eating enough calories throughout the day, you have lost weight, and/or you need to add protein to your diet. Sometimes you may not feel like eating, even if you know the importance of good  nutrition. The recommendations in this handout can help you with the following: Regaining your strength and energy Keeping your body healthy Healing and recovering from surgery or illness and fighting infection  Tips: Schedule Your Meals and Snacks Several small meals and snacks are often better tolerated and digested than large meals. Strategies Plan to eat 3 meals and 3 snacks daily. Experiment with timing meals to find out when you have a larger appetite. Appetite may be greatest in the morning after not eating all night so you may prefer to eat your larger meals and snacks in the morning and at lunch. Breakfast-type foods are often better tolerated so eat foods such as eggs, pancakes, waffles and cereal for any meal or snack. Carry snacks with you so you are prepared to eat every 2 to 3 hours. Determine what works best for you if your body's cues for feeling hungry or full are not working. Eat a small meal or snack even if you don't feel hungry. Set a timer to remind you when it is time to eat. Take a walk before you eat (with health care provider's approval). Light or moderate physical activity can help you maintain muscle and increase your appetite.  Make Eating Enjoyable Taking steps to make the experience enjoyable may help to increase your interest in eating and improve your appetite. Strategies: Eat with others whenever possible. Include your favorite foods to make  meals more enjoyable. Try new foods. Save your beverage for the end of the meal so that you have more room for food before you get full.  Add Calories to Your Meals and Snacks Try adding calorie-dense foods so that each bite provides more nutrition. Strategies Drink milk, chocolate milk, soy milk, or smoothies instead of low-calorie beverages such as diet drinks or water. Cook with milk or soy milk instead of water when making dishes such as hot cereal, cocoa, or pudding. Add jelly, jam, honey, butter or  margarine to bread and crackers. Add jam or fruit to ice cream and as a topping over cake. Mix dried fruit, nuts, granola, honey, or dry cereal with yogurt or hot cereals. Enjoy snacks such as milkshakes, smoothies, pudding, ice cream, or custard. Blend a fruit smoothie of a banana, frozen berries, milk or soy milk, and 1 tablespoon nonfat powdered milk or protein powder.  Add Protein to Your Meals and Snacks Choose at least one protein food at each meal and snack to increase your daily intake. Strategies Add  cup nonfat dry milk powder or protein powder to make a high-protein milk to drink or to use in recipes that call for milk. Vanilla or peppermint extract or unsweetened cocoa powder could help to boost the flavor. Add hard-cooked eggs, leftover meat, grated cheese, canned beans or tofu to noodles, rice, salads, sandwiches, soups, casseroles, pasta, tuna and other mixed dishes. Add powdered milk or protein powder to hot cereals, meatloaf, casseroles, scrambled eggs, sauces, cream soups, and shakes. Add beans and lentils to salads, soups, casseroles, and vegetable dishes. Eat cottage cheese or yogurt, especially Greek yogurt, with fruit as a snack or dessert. Eat peanut or other nut butters on crackers, bread, toast, waffles, apples, bananas or celery sticks. Add it to milkshakes, smoothies, or desserts. Consider a ready-made protein shake. Your RDN will make recommendations.  Add Fats to Your Meals and Snacks Try adding fats to your meals and snacks. Fat provides more calories in fewer bites than carbohydrate or protein and adds flavors to your foods. Strategies Snack on nuts and seeds or add them to foods like salads, pasta, cereals, yogurt, and ice cream.  Saut or stir-fry vegetables, meats, chicken, fish or tofu in olive or canola oil.  Add olive oil, other vegetable oils, butter or margarine to soups, vegetables, potatoes, cooked cereal, rice, pasta, bread, crackers, pancakes, or  waffles. Snack on olives or add to pasta, pizza, or salad. Add avocado or guacamole to your salads, sandwiches, and other entrees. Include fatty fish such as salmon in your weekly meal plan. For general food safety tips, especially for clients with immunocompromised conditions, ask your RDN for the Food Safety Nutrition Therapy handout.  Small Meal and Snack Ideas These snacks and meals are recommended when you have to eat but aren't necessarily hungry.  They are good choices because they are high in protein and high in calories.  2 graham crackers 2 tablespoons peanut or other nut butter 1 cup milk 2 slices whole wheat toast topped with:  avocado, mashed Seasoning of your choice   cup Greek yogurt  cup fruit  cup granola 2 deviled egg halves 5 whole wheat crackers  1 cup cream of tomato soup  grilled cheese sandwich 1 toasted waffle topped with: 2 tablespoons peanut or nut butter 1 tablespoon jam  Trail mix made with:  cup nuts  cup dried fruit  cup cold cereal, any variety  cup oatmeal or cream of wheat cereal  1 tablespoon peanut or nut butter  cup diced fruit   High-Calorie, High-Protein Sample 1-Day Menu View Nutrient Info Breakfast 1 egg, scrambled 1 ounce cheddar cheese 1 English muffin, whole wheat 1 tablespoon margarine 1 tablespoon jam  cup orange juice, fortified with calcium and vitamin D  Morning Snack 1 tablespoon peanut butter 1 banana 1 cup 1% milk  Lunch Tuna salad sandwich made with: 2 slices bread, whole wheat 3 ounces tuna mixed with: 1 tablespoon mayonnaise  cup pudding  Afternoon Snack  cup hummus  cup carrots 1 pita  Evening Meal Enchilada casserole made with: 2 corn tortillas 3 ounces ground beef, cooked  cup black beans, cooked  cup corn, cooked 1 ounce grated cheddar cheese  cup enchilada sauce  avocado, sliced, topping for enchilada 1 tablespoon sour cream, topping for enchilada Salad:  cup lettuce, shredded  cup  tomatoes, chopped, for salad 1 tablespoon olive oil and vinegar dressing, for salad  Evening Snack  cup Greek yogurt  cup blueberries  cup granola

## 2023-07-02 DIAGNOSIS — J9621 Acute and chronic respiratory failure with hypoxia: Secondary | ICD-10-CM | POA: Diagnosis not present

## 2023-07-02 DIAGNOSIS — J189 Pneumonia, unspecified organism: Secondary | ICD-10-CM | POA: Diagnosis not present

## 2023-07-02 DIAGNOSIS — J441 Chronic obstructive pulmonary disease with (acute) exacerbation: Secondary | ICD-10-CM | POA: Diagnosis not present

## 2023-07-02 DIAGNOSIS — J206 Acute bronchitis due to rhinovirus: Secondary | ICD-10-CM | POA: Diagnosis not present

## 2023-07-02 MED ORDER — FLUTICASONE FUROATE-VILANTEROL 100-25 MCG/ACT IN AEPB
1.0000 | INHALATION_SPRAY | Freq: Every day | RESPIRATORY_TRACT | Status: DC
  Administered 2023-07-02 – 2023-07-03 (×2): 1 via RESPIRATORY_TRACT
  Filled 2023-07-02: qty 28

## 2023-07-02 MED ORDER — UMECLIDINIUM BROMIDE 62.5 MCG/ACT IN AEPB
1.0000 | INHALATION_SPRAY | Freq: Every day | RESPIRATORY_TRACT | Status: DC
  Administered 2023-07-02 – 2023-07-03 (×2): 1 via RESPIRATORY_TRACT
  Filled 2023-07-02: qty 7

## 2023-07-02 NOTE — Progress Notes (Addendum)
                 Subjective Doing well. Felt a little diaphoretic last night.  Physical exam Blood pressure (!) 140/42, pulse 83, temperature 98 F (36.7 C), resp. rate 17, height 5\' 3"  (1.6 m), weight 48.8 kg, SpO2 96%.  Constitutional: frail-appearing, lying in bed, in no acute distress Cardiovascular: regular rate and rhythm, no m/r/g, no LEE Pulmonary/Chest: normal work of breathing on 2 L [place], diminished breath sounds bilaterally (improving) Neurological: alert & oriented Skin: warm and dry   Weight change: -0.699 kg   Intake/Output Summary (Last 24 hours) at 07/02/2023 1259 Last data filed at 07/02/2023 0354 Gross per 24 hour  Intake --  Output 600 ml  Net -600 ml   Net IO Since Admission: -3,429.87 mL [07/02/23 1259]  Labs, images, and other studies    Latest Ref Rng & Units 06/29/2023    1:28 AM 06/28/2023    4:35 AM 06/27/2023    3:44 AM  CBC  WBC 4.0 - 10.5 K/uL 8.0  8.1  7.5   Hemoglobin 12.0 - 15.0 g/dL 81.1  91.4  78.2   Hematocrit 36.0 - 46.0 % 38.6  40.1  40.7   Platelets 150 - 400 K/uL 169  169  154        Latest Ref Rng & Units 06/29/2023    1:28 AM 06/28/2023    4:35 AM 06/27/2023   11:52 AM  BMP  Glucose 70 - 99 mg/dL 956  91  97   BUN 8 - 23 mg/dL 28  27  27    Creatinine 0.44 - 1.00 mg/dL 2.13  0.86  5.78   Sodium 135 - 145 mmol/L 137  138  135   Potassium 3.5 - 5.1 mmol/L 4.6  4.8  4.9   Chloride 98 - 111 mmol/L 101  102  100   CO2 22 - 32 mmol/L 32  32  27   Calcium 8.9 - 10.3 mg/dL 8.6  8.5  8.8      Assessment and plan Hospital day 10  [name] is a 88 y.o. female with PMH of COPD, HFpEF, CHB s/p pacemaker, severe aortic stenosis s/p TAVR, HTN, GAD, and protein-calorie malnutrition and is presenting with an acute on chronic respiratory failure with hypoxia 2/2 COPD exacerbation.   Acute on chronic hypercarbic respiratory failure and acute hypoxemic respiratory failure due to COPD exacerbation related to presumed community-acquired  pneumonia and rhinovirus Switched nebs to formulary equivalent of home Trelegy. Last day of Prednisone. She is stable for discharge, just awaiting SNF placement.  -Flutter valve/IS -Mucinex BID -BiPAP prn   HTN Stable. Continue home Norvasc   Protein calorie malnutrition, mild Ensure BID.  PT recommending SNF, placement pending   GAD Continue home Celexa. Atarax prn.   HLD Continue home Zocor  Carmina Miller, DO 07/02/2023, 12:59 PM  Pager: [phone] After 5pm or weekend: [phone]

## 2023-07-02 NOTE — Care Management Important Message (Signed)
 Important Message  Patient Details  Name: [name] MRN: [id number] Date of Birth: Jan 10, 1936   Important Message Given:  Yes - Medicare IM     Renie Ora 07/02/2023, 8:57 AM

## 2023-07-02 NOTE — Progress Notes (Signed)
                                                                                                                          Re: [name] Date of Birth: 03-Feb-1936 Date:07/02/2023  To Whom It May Concern:  Please be advised that the above-named patient will require a short-term nursing home stay-anticipated 30 days or less for rehabilitation and strengthening. The plan is to return home.

## 2023-07-02 NOTE — Progress Notes (Signed)
 Occupational Therapy Treatment Patient Details Name: [name] MRN: [id number] DOB: 14-Jul-1935 Today's Date: 07/02/2023   History of present illness 88 yo F admitted 3/25 with SOB, PNA (seen in ED 3/24) and COPD exacerbation. 3/26 respiratory decline requiring bipap. PMhx: COPD, COVID Jan 2025,TAVR, PPM, GIB, endometrial CA, HTN, HLD, GAD   OT comments  Pt reported they would like to ambulate in session. Pt presented on 2L and was able to complete room level and ambulation in hall on 2L but needed one standing rest break as went down to 88% but was able to recover. Pt and family was asking about how o2 tanks/concentrator worked and was educated at this time. Pt then agreed to sit up in chair at this time. Patient will benefit from continued inpatient follow up therapy, <3 hours/day.      If plan is discharge home, recommend the following:  Assistance with cooking/housework;Assist for transportation   Equipment Recommendations  Other (comment) (TBD on progress)    Recommendations for Other Services      Precautions / Restrictions Precautions Precautions: Fall Recall of Precautions/Restrictions: Intact Precaution/Restrictions Comments: Watch O2 Restrictions Weight Bearing Restrictions Per Provider Order: No       Mobility Bed Mobility Overal bed mobility: Needs Assistance Bed Mobility: Supine to Sit     Supine to sit: Supervision, HOB elevated, Used rails          Transfers Overall transfer level: Needs assistance Equipment used: Rolling walker (2 wheels) Transfers: Sit to/from Stand Sit to Stand: Modified independent (Device/Increase time)                 Balance Overall balance assessment: Mild deficits observed, not formally tested                                         ADL either performed or assessed with clinical judgement   ADL Overall ADL's : Needs assistance/impaired Eating/Feeding: Independent   Grooming: Set up;Sitting    Upper Body Bathing: Set up;Sitting   Lower Body Bathing: Supervison/ safety;Sit to/from stand;Sitting/lateral leans   Upper Body Dressing : Set up;Sitting   Lower Body Dressing: Supervision/safety;Sitting/lateral leans;Sit to/from stand   Toilet Transfer: Supervision/safety;Stand-pivot   Toileting- Architect and Hygiene: Supervision/safety;Sit to/from stand;Sitting/lateral lean       Functional mobility during ADLs: Supervision/safety;Contact guard assist;Rolling walker (2 wheels);Cueing for sequencing;Cueing for safety      Extremity/Trunk Assessment Upper Extremity Assessment Upper Extremity Assessment: Generalized weakness   Lower Extremity Assessment Lower Extremity Assessment: Defer to PT evaluation        Vision   Vision Assessment?: Wears glasses for reading   Perception     Praxis     Communication Communication Communication: Impaired Factors Affecting Communication: Hearing impaired (uses hearing aides)   Cognition Arousal: Alert Behavior During Therapy: WFL for tasks assessed/performed Cognition: No apparent impairments                               Following commands: Intact        Cueing   Cueing Techniques: Verbal cues  Exercises      Shoulder Instructions       General Comments Pt on 2L and with ambulation went down to 88% but with standing rest break was able to reciver    Pertinent Vitals/ Pain  Pain Assessment Pain Assessment: No/denies pain  Home Living                                          Prior Functioning/Environment              Frequency  Min 2X/week        Progress Toward Goals  OT Goals(current goals can now be found in the care plan section)  Progress towards OT goals: Progressing toward goals  Acute Rehab OT Goals Patient Stated Goal: to get rid of cough OT Goal Formulation: With patient Time For Goal Achievement: 07/09/23 Potential to Achieve Goals:  Good ADL Goals Pt Will Transfer to Toilet: with modified independence;ambulating Additional ADL Goal #1: Pt to implement at least 3 energy conservation strategies during ADLs, IADLs and mobility Additional ADL Goal #2: Pt to increase standing activity tolerance > 10 min during ADLs, IADLs and mobility without seated rest break  Plan      Co-evaluation                 AM-PAC OT "6 Clicks" Daily Activity     Outcome Measure   Help from another person eating meals?: None Help from another person taking care of personal grooming?: A Little Help from another person toileting, which includes using toliet, bedpan, or urinal?: A Little Help from another person bathing (including washing, rinsing, drying)?: A Little Help from another person to put on and taking off regular upper body clothing?: A Little Help from another person to put on and taking off regular lower body clothing?: A Little 6 Click Score: 19    End of Session Equipment Utilized During Treatment: Oxygen;Rolling walker (2 wheels)  OT Visit Diagnosis: Muscle weakness (generalized) (M62.81)   Activity Tolerance Patient tolerated treatment well   Patient Left in chair;with call bell/phone within reach;with family/visitor present   Nurse Communication Mobility status        Time: 7829-5621 OT Time Calculation (min): 33 min  Charges: OT General Charges $OT Visit: 1 Visit OT Treatments $Self Care/Home Management : 23-37 mins  Presley Raddle OTR/L  Acute Rehab Services  [phone] office number   Alphia Moh 07/02/2023, 11:35 AM

## 2023-07-02 NOTE — TOC Progression Note (Addendum)
 Transition of Care [facility]) - Progression Note    Patient Details  Name: [name] MRN: [id number] Date of Birth: [birth year]  Transition of Care [facility]) CM/SW Contact  Eduard Roux, [place] Phone Number: 07/02/2023, 10:05 AM  Clinical Narrative:     Received insurance authorization received  approval SNF # [id number]  PTAR [id number]  PASRR # Pending - must receive before patient and d/c to SNF  Antony Blackbird, MSW, LCSW Clinical Social Worker    Expected Discharge Plan: Skilled Nursing Facility Barriers to Discharge: Insurance Authorization  Expected Discharge Plan and Services In-house Referral: Clinical Social Work     Living arrangements for the past 2 months: Single Family Home                                       Social Determinants of Health (SDOH) Interventions SDOH Screenings   Food Insecurity: No Food Insecurity (06/23/2023)  Housing: Low Risk  (06/23/2023)  Transportation Needs: No Transportation Needs (06/23/2023)  Utilities: Not At Risk (06/23/2023)  Alcohol Screen: Low Risk  (08/05/2022)  Depression (PHQ2-9): Low Risk  (01/22/2023)  Financial Resource Strain: Low Risk  (08/05/2022)  Physical Activity: Inactive (08/05/2022)  Social Connections: Socially Isolated (06/23/2023)  Stress: No Stress Concern Present (08/05/2022)  Tobacco Use: High Risk (06/23/2023)    Readmission Risk Interventions     No data to display

## 2023-07-02 NOTE — NC FL2 (Signed)
 Door MEDICAID FL2 LEVEL OF CARE FORM     IDENTIFICATION  Patient Name: [name] Birthdate: May 23, 1935 Sex: female Admission Date (Current Location): 06/23/2023  [facility] and [place] Number:  Producer, television/film/video and Address:  [facility] [place], [address] [place], [place] [address]      Provider Number: [id number]  Attending Physician Name and Address:  Ginnie Smart, MD  Relative Name and Phone Number:       Current Level of Care: Hospital Recommended Level of Care: Skilled Nursing Facility Prior Approval Number:    Date Approved/Denied:   PASRR Number: Pending  Discharge Plan: SNF    Current Diagnoses: Patient Active Problem List   Diagnosis Date Noted   Acute bronchitis due to Rhinovirus 06/29/2023   Protein-calorie malnutrition, severe 06/24/2023   COPD exacerbation (HCC) 06/23/2023   Community acquired pneumonia of right lower lobe of lung 06/23/2023   Protein-calorie malnutrition, mild (HCC) 06/23/2023   Acute on chronic respiratory failure with hypoxia (HCC) 06/23/2023   Full incontinence of feces 01/22/2023   Sacroiliac joint dysfunction 01/22/2023   Hypoxemia 12/31/2022   Frequency of urination 08/14/2022   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   NSVT (nonsustained ventricular tachycardia) (HCC) 01/06/2022   Conductive hearing loss, bilateral 09/12/2021   Generalized anxiety disorder 08/11/2021   Tobacco use 10/18/2020   COPD (chronic obstructive pulmonary disease) (HCC) 10/18/2020   Pacemaker-MDT 09/13/2020   Generalized weakness 07/04/2020   Dark stools 07/04/2020   DOE (dyspnea on exertion) 07/04/2020   Duodenal ulcer due to Helicobacter pylori 06/19/2020   Acute blood loss anemia 06/18/2020   HTN (hypertension) 06/18/2020   HLD (hyperlipidemia) 06/18/2020   Carotid arterial disease (HCC) 06/18/2020   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   Severe aortic valve stenosis    Complete heart block  (HCC) 10/20/2018   Endometrial cancer (HCC) 06/12/2011    Orientation RESPIRATION BLADDER Height & Weight     Self, Time, Situation, Place  O2 External catheter, Continent Weight: 107 lb 9.4 oz (48.8 kg) Height:  5\' 3"  (160 cm)  BEHAVIORAL SYMPTOMS/MOOD NEUROLOGICAL BOWEL NUTRITION STATUS      Continent Diet (please see discharge summary)  AMBULATORY STATUS COMMUNICATION OF NEEDS Skin   Limited Assist Verbally Normal                       Personal Care Assistance Level of Assistance  Bathing, Feeding, Dressing Bathing Assistance: Limited assistance Feeding assistance: Independent Dressing Assistance: Limited assistance     Functional Limitations Info  Sight, Hearing, Speech Sight Info: Adequate Hearing Info: Impaired Speech Info: Adequate    SPECIAL CARE FACTORS FREQUENCY  PT (By licensed PT), OT (By licensed OT)     PT Frequency: 5x per week OT Frequency: 5x per week            Contractures Contractures Info: Not present    Additional Factors Info  Code Status, Allergies, Psychotropic Code Status Info: FULL Code Allergies Info: NKA Psychotropic Info: citalopram (CELEXA) tablet 20 mg         Current Medications (07/02/2023):  This is the current hospital active medication list Current Facility-Administered Medications  Medication Dose Route Frequency Provider Last Rate Last Admin   acetaminophen (TYLENOL) tablet 650 mg  650 mg Oral Q6H PRN Modena Slater, DO   650 mg at 07/01/23 2344   amLODipine (NORVASC) tablet 10 mg  10 mg Oral Daily Hunsucker, Lesia Sago, MD  10 mg at 07/02/23 0932   aspirin EC tablet 81 mg  81 mg Oral Daily Hunsucker, Lesia Sago, MD   81 mg at 07/02/23 0932   Chlorhexidine Gluconate Cloth 2 % PADS 6 each  6 each Topical Daily Hunsucker, Lesia Sago, MD   6 each at 07/02/23 0950   citalopram (CELEXA) tablet 20 mg  20 mg Oral Daily Carmina Miller, DO   20 mg at 07/02/23 0932   dextromethorphan (DELSYM) 30 MG/5ML liquid 30 mg  30 mg Oral Q8H  PRN Hunsucker, Lesia Sago, MD   30 mg at 06/26/23 0424   docusate sodium (COLACE) capsule 100 mg  100 mg Oral BID PRN Cristopher Peru, PA-C   100 mg at 06/29/23 0939   enoxaparin (LOVENOX) injection 30 mg  30 mg Subcutaneous Daily Hunsucker, Lesia Sago, MD   30 mg at 07/02/23 0933   feeding supplement (ENSURE ENLIVE / ENSURE PLUS) liquid 237 mL  237 mL Oral BID BM Hunsucker, Lesia Sago, MD   237 mL at 07/01/23 1557   fluticasone furoate-vilanterol (BREO ELLIPTA) 100-25 MCG/ACT 1 puff  1 puff Inhalation Daily Carmina Miller, DO   1 puff at 07/02/23 1610   And   umeclidinium bromide (INCRUSE ELLIPTA) 62.5 MCG/ACT 1 puff  1 puff Inhalation Daily Carmina Miller, DO   1 puff at 07/02/23 0817   guaiFENesin (MUCINEX) 12 hr tablet 1,200 mg  1,200 mg Oral BID Hunsucker, Lesia Sago, MD   1,200 mg at 07/02/23 0932   hydrOXYzine (ATARAX) tablet 50 mg  50 mg Oral TID PRN Hunsucker, Lesia Sago, MD   50 mg at 07/01/23 2345   Oral care mouth rinse  15 mL Mouth Rinse 4 times per day Karren Burly, MD   15 mL at 07/02/23 0935   Oral care mouth rinse  15 mL Mouth Rinse PRN Hunsucker, Lesia Sago, MD       simvastatin (ZOCOR) tablet 20 mg  20 mg Oral Daily Carmina Miller, DO   20 mg at 07/02/23 9604     Discharge Medications: Please see discharge summary for a list of discharge medications.  Relevant Imaging Results:  Relevant Lab Results:   Additional Information SSN [id number]  Eduard Roux, LCSW

## 2023-07-03 DIAGNOSIS — Z7401 Bed confinement status: Secondary | ICD-10-CM | POA: Diagnosis not present

## 2023-07-03 DIAGNOSIS — K5900 Constipation, unspecified: Secondary | ICD-10-CM | POA: Diagnosis present

## 2023-07-03 DIAGNOSIS — J80 Acute respiratory distress syndrome: Secondary | ICD-10-CM | POA: Diagnosis not present

## 2023-07-03 DIAGNOSIS — I2510 Atherosclerotic heart disease of native coronary artery without angina pectoris: Secondary | ICD-10-CM | POA: Diagnosis present

## 2023-07-03 DIAGNOSIS — R54 Age-related physical debility: Secondary | ICD-10-CM | POA: Diagnosis present

## 2023-07-03 DIAGNOSIS — I959 Hypotension, unspecified: Secondary | ICD-10-CM | POA: Diagnosis not present

## 2023-07-03 DIAGNOSIS — F411 Generalized anxiety disorder: Secondary | ICD-10-CM | POA: Diagnosis not present

## 2023-07-03 DIAGNOSIS — I272 Pulmonary hypertension, unspecified: Secondary | ICD-10-CM | POA: Diagnosis present

## 2023-07-03 DIAGNOSIS — J9601 Acute respiratory failure with hypoxia: Secondary | ICD-10-CM | POA: Diagnosis not present

## 2023-07-03 DIAGNOSIS — J9600 Acute respiratory failure, unspecified whether with hypoxia or hypercapnia: Secondary | ICD-10-CM | POA: Diagnosis not present

## 2023-07-03 DIAGNOSIS — I442 Atrioventricular block, complete: Secondary | ICD-10-CM | POA: Diagnosis present

## 2023-07-03 DIAGNOSIS — R609 Edema, unspecified: Secondary | ICD-10-CM | POA: Diagnosis not present

## 2023-07-03 DIAGNOSIS — F410 Panic disorder [episodic paroxysmal anxiety] without agoraphobia: Secondary | ICD-10-CM | POA: Diagnosis present

## 2023-07-03 DIAGNOSIS — J9622 Acute and chronic respiratory failure with hypercapnia: Secondary | ICD-10-CM | POA: Diagnosis present

## 2023-07-03 DIAGNOSIS — J449 Chronic obstructive pulmonary disease, unspecified: Secondary | ICD-10-CM | POA: Diagnosis not present

## 2023-07-03 DIAGNOSIS — E440 Moderate protein-calorie malnutrition: Secondary | ICD-10-CM | POA: Diagnosis present

## 2023-07-03 DIAGNOSIS — J9811 Atelectasis: Secondary | ICD-10-CM | POA: Diagnosis present

## 2023-07-03 DIAGNOSIS — Z66 Do not resuscitate: Secondary | ICD-10-CM | POA: Diagnosis present

## 2023-07-03 DIAGNOSIS — J984 Other disorders of lung: Secondary | ICD-10-CM | POA: Diagnosis not present

## 2023-07-03 DIAGNOSIS — R0989 Other specified symptoms and signs involving the circulatory and respiratory systems: Secondary | ICD-10-CM | POA: Diagnosis not present

## 2023-07-03 DIAGNOSIS — I110 Hypertensive heart disease with heart failure: Secondary | ICD-10-CM | POA: Diagnosis present

## 2023-07-03 DIAGNOSIS — R0602 Shortness of breath: Secondary | ICD-10-CM | POA: Diagnosis present

## 2023-07-03 DIAGNOSIS — J441 Chronic obstructive pulmonary disease with (acute) exacerbation: Secondary | ICD-10-CM | POA: Diagnosis present

## 2023-07-03 DIAGNOSIS — Z515 Encounter for palliative care: Secondary | ICD-10-CM | POA: Diagnosis not present

## 2023-07-03 DIAGNOSIS — J189 Pneumonia, unspecified organism: Secondary | ICD-10-CM | POA: Diagnosis not present

## 2023-07-03 DIAGNOSIS — R918 Other nonspecific abnormal finding of lung field: Secondary | ICD-10-CM | POA: Diagnosis not present

## 2023-07-03 DIAGNOSIS — E43 Unspecified severe protein-calorie malnutrition: Secondary | ICD-10-CM | POA: Diagnosis not present

## 2023-07-03 DIAGNOSIS — Z9981 Dependence on supplemental oxygen: Secondary | ICD-10-CM | POA: Diagnosis not present

## 2023-07-03 DIAGNOSIS — I5033 Acute on chronic diastolic (congestive) heart failure: Secondary | ICD-10-CM | POA: Diagnosis present

## 2023-07-03 DIAGNOSIS — I10 Essential (primary) hypertension: Secondary | ICD-10-CM | POA: Diagnosis not present

## 2023-07-03 DIAGNOSIS — Z1152 Encounter for screening for COVID-19: Secondary | ICD-10-CM | POA: Diagnosis not present

## 2023-07-03 DIAGNOSIS — M6281 Muscle weakness (generalized): Secondary | ICD-10-CM | POA: Diagnosis not present

## 2023-07-03 DIAGNOSIS — F064 Anxiety disorder due to known physiological condition: Secondary | ICD-10-CM | POA: Diagnosis present

## 2023-07-03 DIAGNOSIS — J90 Pleural effusion, not elsewhere classified: Secondary | ICD-10-CM | POA: Diagnosis not present

## 2023-07-03 DIAGNOSIS — D509 Iron deficiency anemia, unspecified: Secondary | ICD-10-CM | POA: Diagnosis present

## 2023-07-03 DIAGNOSIS — R001 Bradycardia, unspecified: Secondary | ICD-10-CM | POA: Diagnosis not present

## 2023-07-03 DIAGNOSIS — R61 Generalized hyperhidrosis: Secondary | ICD-10-CM | POA: Diagnosis not present

## 2023-07-03 DIAGNOSIS — Z79899 Other long term (current) drug therapy: Secondary | ICD-10-CM | POA: Diagnosis not present

## 2023-07-03 DIAGNOSIS — R2681 Unsteadiness on feet: Secondary | ICD-10-CM | POA: Diagnosis not present

## 2023-07-03 DIAGNOSIS — J206 Acute bronchitis due to rhinovirus: Secondary | ICD-10-CM | POA: Diagnosis not present

## 2023-07-03 DIAGNOSIS — Z681 Body mass index (BMI) 19 or less, adult: Secondary | ICD-10-CM | POA: Diagnosis not present

## 2023-07-03 DIAGNOSIS — Z87891 Personal history of nicotine dependence: Secondary | ICD-10-CM | POA: Diagnosis not present

## 2023-07-03 DIAGNOSIS — Z7189 Other specified counseling: Secondary | ICD-10-CM | POA: Diagnosis not present

## 2023-07-03 DIAGNOSIS — J9621 Acute and chronic respiratory failure with hypoxia: Secondary | ICD-10-CM | POA: Diagnosis present

## 2023-07-03 DIAGNOSIS — E874 Mixed disorder of acid-base balance: Secondary | ICD-10-CM | POA: Diagnosis present

## 2023-07-03 DIAGNOSIS — R0902 Hypoxemia: Secondary | ICD-10-CM | POA: Diagnosis not present

## 2023-07-03 DIAGNOSIS — R042 Hemoptysis: Secondary | ICD-10-CM | POA: Diagnosis present

## 2023-07-03 DIAGNOSIS — Z952 Presence of prosthetic heart valve: Secondary | ICD-10-CM | POA: Diagnosis not present

## 2023-07-03 MED ORDER — ENSURE ENLIVE PO LIQD: 237.0000 mL | Freq: Two times a day (BID) | ORAL | Status: DC

## 2023-07-03 MED ORDER — HALOBETASOL PROPIONATE 0.05 % EX CREA: TOPICAL_CREAM | CUTANEOUS | Status: DC

## 2023-07-03 MED ORDER — GUAIFENESIN ER 600 MG PO TB12: 1200.0000 mg | ORAL_TABLET | Freq: Two times a day (BID) | ORAL | Status: DC

## 2023-07-03 NOTE — Discharge Summary (Signed)
 Name: [name] MRN: [id number] DOB: 25-May-1935 88 y.o. PCP: Sandre Kitty, MD  Date of Admission: 06/23/2023  7:54 AM Date of Discharge:  07/03/2023 Attending Physician: Dr.  Johny Sax  DISCHARGE DIAGNOSIS:  Primary Problem: Acute on chronic respiratory failure with hypoxia [facility])   Hospital Problems: Principal Problem:   Acute on chronic respiratory failure with hypoxia (HCC) Active Problems:   Complete heart block (HCC)   Severe aortic valve stenosis   S/P TAVR (transcatheter aortic valve replacement)   HTN (hypertension)   HLD (hyperlipidemia)   COPD (chronic obstructive pulmonary disease) (HCC)   Generalized anxiety disorder   COPD exacerbation (HCC)   Community acquired pneumonia of right lower lobe of lung   Protein-calorie malnutrition, mild (HCC)   Protein-calorie malnutrition, severe   Acute bronchitis due to Rhinovirus    DISCHARGE MEDICATIONS:   Allergies as of 07/03/2023   No Known Allergies      Medication List     STOP taking these medications    azithromycin 500 MG tablet Commonly known as: ZITHROMAX   furosemide 20 MG tablet Commonly known as: LASIX   predniSONE 50 MG tablet Commonly known as: DELTASONE       TAKE these medications    acetaminophen 500 MG tablet Commonly known as: TYLENOL Take 500 mg by mouth at bedtime.   albuterol 108 (90 Base) MCG/ACT inhaler Commonly known as: VENTOLIN HFA INHALE 2 PUFFS BY MOUTH EVERY 4 HOURS AS NEEDED FOR WHEEZING FOR SHORTNESS OF BREATH   amLODipine 10 MG tablet Commonly known as: NORVASC TAKE 1 TABLET BY MOUTH ONCE DAILY . APPOINTMENT REQUIRED FOR FUTURE REFILLS WITH  CARDIOLOGIST  IN  OCTOBER  2024,  FINAL  ATTEMPT What changed: See the new instructions.   aspirin EC 81 MG tablet Take 81 mg by mouth at bedtime.   carboxymethylcellulose 0.5 % Soln Commonly known as: REFRESH PLUS Place 1 drop into both eyes daily as needed (dry eyes).   citalopram 40 MG tablet Commonly known  as: CELEXA Take 1/2 (one-half) tablet by mouth once daily What changed: See the new instructions.   feeding supplement Liqd Take 237 mLs by mouth 2 (two) times daily between meals.   guaiFENesin 600 MG 12 hr tablet Commonly known as: MUCINEX Take 2 tablets (1,200 mg total) by mouth 2 (two) times daily.   halobetasol 0.05 % cream Commonly known as: ULTRAVATE Use twice per week as needed for rash on scalp. What changed:  how to take this when to take this additional instructions   hydrOXYzine 50 MG capsule Commonly known as: VISTARIL Take 1 capsule (50 mg total) by mouth 3 (three) times daily as needed for anxiety. What changed: when to take this   pantoprazole 20 MG tablet Commonly known as: PROTONIX Take 1 tablet by mouth once daily What changed: when to take this   simvastatin 20 MG tablet Commonly known as: ZOCOR Take 1 tablet by mouth once daily What changed: when to take this   Trelegy Ellipta 100-62.5-25 MCG/ACT Aepb Generic drug: Fluticasone-Umeclidin-Vilant Inhale 1 puff into the lungs daily.   VITAMIN E PO Take 1 capsule by mouth at bedtime.               Durable Medical Equipment  (From admission, onward)           Start     Ordered   06/30/23 0734  For home use only DME oxygen  Once       Question  Answer Comment  Length of Need Lifetime   Mode or (Route) Nasal cannula   Liters per Minute 3   Frequency Continuous (stationary and portable oxygen unit needed)   Oxygen delivery system Gas      06/30/23 0734            DISPOSITION AND FOLLOW-UP:  [name] was discharged from [facility] in Stable condition. At the hospital follow up visit please address:  Acute on chronic respiratory failure with hypoxia Ensure O2 needs are met with DME O2. Consider pulmonary rehab. Consider repeat DG chest to ensure resolution/interval improvement of CAP  HTN Lasix discontinued as she did not need it during admission.     Follow-up Appointments:  Contact information for after-discharge care     Destination     [facility], [facility] .   Service: Skilled Nursing Contact information: [address] [place] [address] [phone]                     HOSPITAL COURSE:  Patient Summary:  Acute on chronic respiratory failure with hypoxia and hypercapnia 2/2 CAP-associated COPD Exacerbation Presented to ED with worsening SOB.  She was mildly hypoxic/hypercapnic.  CT chest showed bilateral basilar infiltrates.  She was started on azithromycin, Rocephin, and nebulizer equivalents of home Trelegy.  On hospital day 2 her respiratory status worsened.  PCCM was consulted and patient was transferred to ICU and placed on BiPAP.  Respiratory panel was positive for rhinovirus.  She was given 4 days of Solu-Medrol with improvement in respiratory status.  She was stable enough to be transferred to the floor on hospital day 5 and transition to prednisone and completed course prior to discharge. Ambulatory O2 required 2-3 L to maintain O2 > 88%. Overall symptoms improved during admission.  PT/OT recommended SNF where she will go upon discharge for further rehab/management. Home O2 needs to be re-addressed prior to SNF discharge.  SUBJECTIVE:  SOB improved. Comfortable with discharge. Discharge Vitals:   BP (!) 140/40 (BP Location: Right Arm)   Pulse 81   Temp 98.6 F (37 C) (Oral)   Resp 18   Ht 5\' 3"  (1.6 m)   Wt 48.2 kg   SpO2 94%   BMI 18.82 kg/m   OBJECTIVE:  Constitutional: frail-appearing, lying in bed, in no acute distress Cardiovascular: regular rate and rhythm, no m/r/g, no LEE Pulmonary/Chest: normal work of breathing on 2 L [place], diminished breath sounds bilaterally (improving) Neurological: alert & oriented Skin: warm and dry  Pertinent Labs, Studies, and Procedures:     Latest Ref Rng & Units 06/29/2023    1:28 AM 06/28/2023    4:35 AM  06/27/2023    3:44 AM  CBC  WBC 4.0 - 10.5 K/uL 8.0  8.1  7.5   Hemoglobin 12.0 - 15.0 g/dL 91.4  78.2  95.6   Hematocrit 36.0 - 46.0 % 38.6  40.1  40.7   Platelets 150 - 400 K/uL 169  169  154        Latest Ref Rng & Units 06/29/2023    1:28 AM 06/28/2023    4:35 AM 06/27/2023   11:52 AM  CMP  Glucose 70 - 99 mg/dL 213  91  97   BUN 8 - 23 mg/dL 28  27  27    Creatinine 0.44 - 1.00 mg/dL 0.86  5.78  4.69   Sodium 135 - 145 mmol/L 137  138  135   Potassium 3.5 - 5.1 mmol/L 4.6  4.8  4.9   Chloride 98 - 111 mmol/L 101  102  100   CO2 22 - 32 mmol/L 32  32  27   Calcium 8.9 - 10.3 mg/dL 8.6  8.5  8.8     DG CHEST PORT 1 VIEW Result Date: 06/24/2023 CLINICAL DATA:  Shortness of breath EXAM: PORTABLE CHEST 1 VIEW COMPARISON:  06/23/2023 FINDINGS: Left-sided implanted cardiac device remains in place. Normal heart size status post TAVR. Aortic atherosclerosis. Hyperinflated lungs. Increasing bibasilar interstitial opacities. No pleural effusion or pneumothorax. IMPRESSION: Increasing bibasilar interstitial opacities, which may reflect atelectasis versus developing infiltrates and/or aspiration. Electronically Signed   By: Duanne Guess D.O.   On: 06/24/2023 13:22   DG Chest Port 1 View Result Date: 06/23/2023 CLINICAL DATA:  Cough and shortness of breath.  Questionable sepsis EXAM: PORTABLE CHEST 1 VIEW COMPARISON:  Chest radiograph dated 06/22/2023 FINDINGS: Left chest wall pacemaker leads project over the right atrium and ventricle. Hyperinflated lungs. Increased bibasilar patchy opacities. No pleural effusion or pneumothorax. Similar postsurgical cardiomediastinal silhouette. No acute osseous abnormality. IMPRESSION: Hyperinflated lungs, which can be seen in the setting of COPD. Increased bibasilar patchy opacities, which may represent aspiration or pneumonia. Electronically Signed   By: Agustin Cree M.D.   On: 06/23/2023 10:17     Signed: Carmina Miller, DO  Internal Medicine Resident,  PGY-1 [facility]  Pager: [phone] 11:00 AM, 07/03/2023

## 2023-07-03 NOTE — Progress Notes (Addendum)
 Physical Therapy Treatment Patient Details Name: [name] MRN: [id number] DOB: January 07, 1936 Today's Date: 07/03/2023   History of Present Illness 88 yo F admitted 3/25 with SOB, PNA (seen in ED 3/24) and COPD exacerbation. 3/26 respiratory decline requiring bipap. PMhx: COPD, COVID Jan 2025,TAVR, PPM, GIB, endometrial CA, HTN, HLD, GAD    PT Comments  Pt admitted with above diagnosis. Pt was able to ambulate with RW with supervision. Continues to have O2 needs needing 2LO2 at rest and with activity. Will continue to follow acutely.  Educated and had pt demonstrate incentive spirometer. Pt currently with functional limitations due to the deficits listed below (see PT Problem List). Pt will benefit from acute skilled PT to increase their independence and safety with mobility to allow discharge.       If plan is discharge home, recommend the following: A little help with walking and/or transfers;A lot of help with bathing/dressing/bathroom;Assist for transportation;Help with stairs or ramp for entrance;Assistance with cooking/housework   Can travel by private vehicle     Yes  Equipment Recommendations  None recommended by PT (Pt already has DME)    Recommendations for Other Services       Precautions / Restrictions Precautions Precautions: Fall Recall of Precautions/Restrictions: Intact Precaution/Restrictions Comments: Watch O2 Restrictions Weight Bearing Restrictions Per Provider Order: No     Mobility  Bed Mobility Overal bed mobility: Needs Assistance Bed Mobility: Supine to Sit     Supine to sit: Supervision, HOB elevated, Used rails Sit to supine: Modified independent (Device/Increase time)   General bed mobility comments: supervision for safety and line management    Transfers Overall transfer level: Needs assistance Equipment used: Rolling walker (2 wheels) Transfers: Sit to/from Stand Sit to Stand: Supervision           General transfer comment: Pt  supervision for sit to stand, only needed assist with O2    Ambulation/Gait Ambulation/Gait assistance: Contact guard assist Gait Distance (Feet): 350 Feet Assistive device: Rolling walker (2 wheels) Gait Pattern/deviations: Step-through pattern, Decreased stride length Gait velocity: reduced     General Gait Details: Pt on 3LO2 on arrival. Pt ambulated with slow steps. She demonstrated even weight shift, upright posture, and adequate foot clearance. SpO2 to 88% on RA therefore placed pt on 1L and sats still 90%.  Incr to 2LO2 with SpO2 93-95%. Distance limited by fatigue. Discussed with MD regarding O2 >90% on 2L and MD agreed to leave pt on 2L.   Stairs             Wheelchair Mobility     Tilt Bed    Modified Rankin (Stroke Patients Only)       Balance Overall balance assessment: Needs assistance         Standing balance support: Bilateral upper extremity supported, No upper extremity supported, During functional activity Standing balance-Leahy Scale: Fair Standing balance comment: Pt not reliant on device and can walk short distances without device.                            Communication Communication Communication: Impaired Factors Affecting Communication: Hearing impaired (uses hearing aides)  Cognition Arousal: Alert Behavior During Therapy: WFL for tasks assessed/performed   PT - Cognitive impairments: No apparent impairments                         Following commands: Intact      Cueing Cueing  Techniques: Verbal cues  Exercises      General Comments        Pertinent Vitals/Pain Pain Assessment Pain Assessment: No/denies pain    Home Living                          Prior Function            PT Goals (current goals can now be found in the care plan section) Progress towards PT goals: Progressing toward goals    Frequency    Min 2X/week      PT Plan      Co-evaluation               AM-PAC PT "6 Clicks" Mobility   Outcome Measure  Help needed turning from your back to your side while in a flat bed without using bedrails?: A Little Help needed moving from lying on your back to sitting on the side of a flat bed without using bedrails?: A Little Help needed moving to and from a bed to a chair (including a wheelchair)?: A Little Help needed standing up from a chair using your arms (e.g., wheelchair or bedside chair)?: A Little Help needed to walk in hospital room?: A Little Help needed climbing 3-5 steps with a railing? : Total 6 Click Score: 16    End of Session Equipment Utilized During Treatment: Gait belt;Oxygen Activity Tolerance: Patient tolerated treatment well Patient left: with call bell/phone within reach;in bed Nurse Communication: Mobility status (decr O2 to 2L) PT Visit Diagnosis: Other abnormalities of gait and mobility (R26.89);Muscle weakness (generalized) (M62.81)     Time: 0454-0981 PT Time Calculation (min) (ACUTE ONLY): 22 min  Charges:    $Gait Training: 8-22 mins PT General Charges $$ ACUTE PT VISIT: 1 Visit                     [name] M,PT Acute Rehab Services [phone]    Bevelyn Buckles 07/03/2023, 2:25 PM

## 2023-07-03 NOTE — Plan of Care (Signed)
  Problem: Clinical Measurements: Goal: Ability to maintain clinical measurements within normal limits will improve Outcome: Progressing Goal: Will remain free from infection Outcome: Progressing Goal: Diagnostic test results will improve Outcome: Progressing   Problem: Elimination: Goal: Will not experience complications related to bowel motility Outcome: Progressing   

## 2023-07-03 NOTE — Progress Notes (Signed)
 Mobility Specialist Progress Note:   07/03/23 1046  Mobility  Activity Ambulated with assistance in hallway  Level of Assistance Standby assist, set-up cues, supervision of patient - no hands on  Assistive Device Front wheel walker  Distance Ambulated (ft) 275 ft  Activity Response Tolerated well  Mobility Referral Yes  Mobility visit 1 Mobility  Mobility Specialist Start Time (ACUTE ONLY) 0930  Mobility Specialist Stop Time (ACUTE ONLY) 0955  Mobility Specialist Time Calculation (min) (ACUTE ONLY) 25 min   Pt received in bed, agreeable to mobility. VSS on 3 L. Pt denied any SOB during ambulation. Pt was able to ambulate in hallway with only supervision. When returning to room pt requesting to use BR for BM. Void successful. Assisted with pericare. Pt left in chair with call bell in reach and all needs met.   Leory Plowman  Mobility Specialist Please contact via [employer] office at [phone]

## 2023-07-03 NOTE — Progress Notes (Signed)
 SATURATION QUALIFICATIONS: (This note is used to comply with regulatory documentation for home oxygen)  Patient Saturations on Room Air at Rest = 89%  Patient Saturations on Room Air while Ambulating = 88%  Patient Saturations on 2 Liters of oxygen while Ambulating = 92%  Please briefly explain why patient needs home oxygen:Pt requiring O2 at rest and with activity to keep sats >90%. Connell Bognar M,PT Acute Rehab Services [phone]

## 2023-07-03 NOTE — TOC Transition Note (Signed)
 Transition of Care [facility]) - Discharge Note   Patient Details  Name: [name] MRN: [id number] Date of Birth: 30-Sep-1935  [facility] CM/SW Contact:  Erin Sons, LCSW Phone Number: 07/03/2023, 11:49 AM   Clinical Narrative:     PASRR approved [id number] E expires 5/3 [place] can admit today.   Per MD patient ready for DC to [facility]. RN, patient, patient's family, and facility notified of DC. Discharge Summary and FL2 sent to facility. RN to call report prior to discharge [phone]). DC packet on chart. Ambulance transport requested for patient.   CSW will sign off for now as social work intervention is no longer needed. Please consult [place] again if new needs arise.   Final next level of care: Skilled Nursing Facility Barriers to Discharge: No Barriers Identified     Discharge Placement              Patient chooses bed at: [facility] Patient to be transferred to facility by: [facility] Name of family member notified: Niece Misty Stanley Patient and family notified of of transfer: 07/03/23  Discharge Plan and Services Additional resources added to the After Visit Summary for   In-house Referral: Clinical Social Work                                   Social Drivers of Health (SDOH) Interventions SDOH Screenings   Food Insecurity: No Food Insecurity (06/23/2023)  Housing: Low Risk  (06/23/2023)  Transportation Needs: No Transportation Needs (06/23/2023)  Utilities: Not At Risk (06/23/2023)  Alcohol Screen: Low Risk  (08/05/2022)  Depression (PHQ2-9): Low Risk  (01/22/2023)  Financial Resource Strain: Low Risk  (08/05/2022)  Physical Activity: Inactive (08/05/2022)  Social Connections: Socially Isolated (06/23/2023)  Stress: No Stress Concern Present (08/05/2022)  Tobacco Use: High Risk (06/23/2023)     Readmission Risk Interventions     No data to display

## 2023-07-03 NOTE — Plan of Care (Signed)
 Reviewed AVS with receiving nurse at [facility]. Nyesha LPN

## 2023-07-06 DIAGNOSIS — J441 Chronic obstructive pulmonary disease with (acute) exacerbation: Secondary | ICD-10-CM | POA: Diagnosis not present

## 2023-07-06 DIAGNOSIS — F411 Generalized anxiety disorder: Secondary | ICD-10-CM | POA: Diagnosis not present

## 2023-07-06 DIAGNOSIS — J9621 Acute and chronic respiratory failure with hypoxia: Secondary | ICD-10-CM | POA: Diagnosis not present

## 2023-07-06 DIAGNOSIS — J206 Acute bronchitis due to rhinovirus: Secondary | ICD-10-CM | POA: Diagnosis not present

## 2023-07-06 DIAGNOSIS — J9601 Acute respiratory failure with hypoxia: Secondary | ICD-10-CM | POA: Diagnosis not present

## 2023-07-06 DIAGNOSIS — E43 Unspecified severe protein-calorie malnutrition: Secondary | ICD-10-CM | POA: Diagnosis not present

## 2023-07-06 DIAGNOSIS — M6281 Muscle weakness (generalized): Secondary | ICD-10-CM | POA: Diagnosis not present

## 2023-07-09 ENCOUNTER — Ambulatory Visit: Payer: PPO | Admitting: Emergency Medicine

## 2023-07-10 DIAGNOSIS — I10 Essential (primary) hypertension: Secondary | ICD-10-CM | POA: Diagnosis not present

## 2023-07-11 ENCOUNTER — Emergency Department (HOSPITAL_COMMUNITY)

## 2023-07-11 ENCOUNTER — Other Ambulatory Visit: Payer: Self-pay

## 2023-07-11 ENCOUNTER — Inpatient Hospital Stay (HOSPITAL_COMMUNITY)

## 2023-07-11 ENCOUNTER — Inpatient Hospital Stay (HOSPITAL_COMMUNITY)
Admission: EM | Admit: 2023-07-11 | Discharge: 2023-07-27 | DRG: 291 | Disposition: A | Discharge status: Skilled Nursing Facility | Attending: Internal Medicine | Admitting: Internal Medicine

## 2023-07-11 DIAGNOSIS — I10 Essential (primary) hypertension: Secondary | ICD-10-CM | POA: Diagnosis present

## 2023-07-11 DIAGNOSIS — D509 Iron deficiency anemia, unspecified: Secondary | ICD-10-CM | POA: Diagnosis not present

## 2023-07-11 DIAGNOSIS — Z7401 Bed confinement status: Secondary | ICD-10-CM | POA: Diagnosis not present

## 2023-07-11 DIAGNOSIS — I110 Hypertensive heart disease with heart failure: Principal | ICD-10-CM | POA: Diagnosis present

## 2023-07-11 DIAGNOSIS — R1319 Other dysphagia: Secondary | ICD-10-CM | POA: Diagnosis not present

## 2023-07-11 DIAGNOSIS — I2510 Atherosclerotic heart disease of native coronary artery without angina pectoris: Secondary | ICD-10-CM | POA: Diagnosis present

## 2023-07-11 DIAGNOSIS — R0602 Shortness of breath: Secondary | ICD-10-CM | POA: Diagnosis present

## 2023-07-11 DIAGNOSIS — Z515 Encounter for palliative care: Secondary | ICD-10-CM | POA: Diagnosis not present

## 2023-07-11 DIAGNOSIS — M7989 Other specified soft tissue disorders: Secondary | ICD-10-CM | POA: Diagnosis present

## 2023-07-11 DIAGNOSIS — J206 Acute bronchitis due to rhinovirus: Secondary | ICD-10-CM | POA: Diagnosis not present

## 2023-07-11 DIAGNOSIS — I272 Pulmonary hypertension, unspecified: Secondary | ICD-10-CM | POA: Diagnosis present

## 2023-07-11 DIAGNOSIS — J189 Pneumonia, unspecified organism: Secondary | ICD-10-CM | POA: Diagnosis not present

## 2023-07-11 DIAGNOSIS — Z66 Do not resuscitate: Secondary | ICD-10-CM | POA: Diagnosis not present

## 2023-07-11 DIAGNOSIS — J9600 Acute respiratory failure, unspecified whether with hypoxia or hypercapnia: Secondary | ICD-10-CM

## 2023-07-11 DIAGNOSIS — E440 Moderate protein-calorie malnutrition: Secondary | ICD-10-CM | POA: Diagnosis present

## 2023-07-11 DIAGNOSIS — Z87891 Personal history of nicotine dependence: Secondary | ICD-10-CM

## 2023-07-11 DIAGNOSIS — R609 Edema, unspecified: Secondary | ICD-10-CM

## 2023-07-11 DIAGNOSIS — Z952 Presence of prosthetic heart valve: Secondary | ICD-10-CM

## 2023-07-11 DIAGNOSIS — E874 Mixed disorder of acid-base balance: Secondary | ICD-10-CM | POA: Diagnosis present

## 2023-07-11 DIAGNOSIS — F419 Anxiety disorder, unspecified: Secondary | ICD-10-CM

## 2023-07-11 DIAGNOSIS — K219 Gastro-esophageal reflux disease without esophagitis: Secondary | ICD-10-CM

## 2023-07-11 DIAGNOSIS — J9621 Acute and chronic respiratory failure with hypoxia: Secondary | ICD-10-CM | POA: Diagnosis not present

## 2023-07-11 DIAGNOSIS — M6281 Muscle weakness (generalized): Secondary | ICD-10-CM | POA: Diagnosis not present

## 2023-07-11 DIAGNOSIS — R54 Age-related physical debility: Secondary | ICD-10-CM | POA: Diagnosis not present

## 2023-07-11 DIAGNOSIS — R042 Hemoptysis: Secondary | ICD-10-CM | POA: Diagnosis not present

## 2023-07-11 DIAGNOSIS — J80 Acute respiratory distress syndrome: Secondary | ICD-10-CM | POA: Diagnosis not present

## 2023-07-11 DIAGNOSIS — F064 Anxiety disorder due to known physiological condition: Secondary | ICD-10-CM | POA: Diagnosis present

## 2023-07-11 DIAGNOSIS — E8770 Fluid overload, unspecified: Secondary | ICD-10-CM

## 2023-07-11 DIAGNOSIS — I442 Atrioventricular block, complete: Secondary | ICD-10-CM | POA: Diagnosis present

## 2023-07-11 DIAGNOSIS — Z79899 Other long term (current) drug therapy: Secondary | ICD-10-CM

## 2023-07-11 DIAGNOSIS — K5900 Constipation, unspecified: Secondary | ICD-10-CM | POA: Diagnosis not present

## 2023-07-11 DIAGNOSIS — J441 Chronic obstructive pulmonary disease with (acute) exacerbation: Secondary | ICD-10-CM | POA: Diagnosis not present

## 2023-07-11 DIAGNOSIS — J9622 Acute and chronic respiratory failure with hypercapnia: Secondary | ICD-10-CM | POA: Diagnosis not present

## 2023-07-11 DIAGNOSIS — R61 Generalized hyperhidrosis: Secondary | ICD-10-CM | POA: Diagnosis not present

## 2023-07-11 DIAGNOSIS — R1311 Dysphagia, oral phase: Secondary | ICD-10-CM | POA: Diagnosis not present

## 2023-07-11 DIAGNOSIS — D72829 Elevated white blood cell count, unspecified: Secondary | ICD-10-CM | POA: Diagnosis present

## 2023-07-11 DIAGNOSIS — F410 Panic disorder [episodic paroxysmal anxiety] without agoraphobia: Secondary | ICD-10-CM | POA: Diagnosis present

## 2023-07-11 DIAGNOSIS — R918 Other nonspecific abnormal finding of lung field: Secondary | ICD-10-CM | POA: Diagnosis not present

## 2023-07-11 DIAGNOSIS — I959 Hypotension, unspecified: Secondary | ICD-10-CM | POA: Diagnosis not present

## 2023-07-11 DIAGNOSIS — J449 Chronic obstructive pulmonary disease, unspecified: Secondary | ICD-10-CM | POA: Diagnosis not present

## 2023-07-11 DIAGNOSIS — R531 Weakness: Secondary | ICD-10-CM | POA: Diagnosis not present

## 2023-07-11 DIAGNOSIS — I5033 Acute on chronic diastolic (congestive) heart failure: Secondary | ICD-10-CM | POA: Diagnosis not present

## 2023-07-11 DIAGNOSIS — R001 Bradycardia, unspecified: Secondary | ICD-10-CM | POA: Diagnosis not present

## 2023-07-11 DIAGNOSIS — Z9981 Dependence on supplemental oxygen: Secondary | ICD-10-CM

## 2023-07-11 DIAGNOSIS — I5030 Unspecified diastolic (congestive) heart failure: Secondary | ICD-10-CM | POA: Insufficient documentation

## 2023-07-11 DIAGNOSIS — Z7951 Long term (current) use of inhaled steroids: Secondary | ICD-10-CM

## 2023-07-11 DIAGNOSIS — R000 Tachycardia, unspecified: Secondary | ICD-10-CM | POA: Diagnosis present

## 2023-07-11 DIAGNOSIS — Z681 Body mass index (BMI) 19 or less, adult: Secondary | ICD-10-CM

## 2023-07-11 DIAGNOSIS — Z7189 Other specified counseling: Secondary | ICD-10-CM

## 2023-07-11 DIAGNOSIS — Z7982 Long term (current) use of aspirin: Secondary | ICD-10-CM

## 2023-07-11 DIAGNOSIS — J984 Other disorders of lung: Secondary | ICD-10-CM | POA: Diagnosis not present

## 2023-07-11 DIAGNOSIS — Z1152 Encounter for screening for COVID-19: Secondary | ICD-10-CM

## 2023-07-11 DIAGNOSIS — Z950 Presence of cardiac pacemaker: Secondary | ICD-10-CM

## 2023-07-11 DIAGNOSIS — R0989 Other specified symptoms and signs involving the circulatory and respiratory systems: Secondary | ICD-10-CM | POA: Diagnosis not present

## 2023-07-11 DIAGNOSIS — R2681 Unsteadiness on feet: Secondary | ICD-10-CM | POA: Diagnosis not present

## 2023-07-11 DIAGNOSIS — J9601 Acute respiratory failure with hypoxia: Secondary | ICD-10-CM | POA: Diagnosis not present

## 2023-07-11 DIAGNOSIS — J9811 Atelectasis: Secondary | ICD-10-CM | POA: Diagnosis not present

## 2023-07-11 DIAGNOSIS — D649 Anemia, unspecified: Secondary | ICD-10-CM | POA: Insufficient documentation

## 2023-07-11 DIAGNOSIS — J90 Pleural effusion, not elsewhere classified: Secondary | ICD-10-CM | POA: Diagnosis not present

## 2023-07-11 DIAGNOSIS — R2689 Other abnormalities of gait and mobility: Secondary | ICD-10-CM | POA: Diagnosis not present

## 2023-07-11 DIAGNOSIS — R0902 Hypoxemia: Secondary | ICD-10-CM | POA: Diagnosis not present

## 2023-07-11 DIAGNOSIS — E46 Unspecified protein-calorie malnutrition: Secondary | ICD-10-CM | POA: Diagnosis present

## 2023-07-11 DIAGNOSIS — J9691 Respiratory failure, unspecified with hypoxia: Secondary | ICD-10-CM | POA: Diagnosis present

## 2023-07-11 LAB — CBC WITH DIFFERENTIAL/PLATELET
Abs Immature Granulocytes: 0.11 10*3/uL — ABNORMAL HIGH (ref 0.00–0.07)
Basophils Absolute: 0 10*3/uL (ref 0.0–0.1)
Basophils Relative: 0 %
Eosinophils Absolute: 0 10*3/uL (ref 0.0–0.5)
Eosinophils Relative: 0 %
HCT: 35.2 % — ABNORMAL LOW (ref 36.0–46.0)
Hemoglobin: 11.4 g/dL — ABNORMAL LOW (ref 12.0–15.0)
Immature Granulocytes: 1 %
Lymphocytes Relative: 14 %
Lymphs Abs: 2.5 10*3/uL (ref 0.7–4.0)
MCH: 30.5 pg (ref 26.0–34.0)
MCHC: 32.4 g/dL (ref 30.0–36.0)
MCV: 94.1 fL (ref 80.0–100.0)
Monocytes Absolute: 1.2 10*3/uL — ABNORMAL HIGH (ref 0.1–1.0)
Monocytes Relative: 7 %
Neutro Abs: 14.3 10*3/uL — ABNORMAL HIGH (ref 1.7–7.7)
Neutrophils Relative %: 78 %
Platelets: 346 10*3/uL (ref 150–400)
RBC: 3.74 MIL/uL — ABNORMAL LOW (ref 3.87–5.11)
RDW: 15.9 % — ABNORMAL HIGH (ref 11.5–15.5)
WBC: 18.2 10*3/uL — ABNORMAL HIGH (ref 4.0–10.5)
nRBC: 0 % (ref 0.0–0.2)

## 2023-07-11 LAB — COMPREHENSIVE METABOLIC PANEL WITH GFR
ALT: 35 U/L (ref 0–44)
AST: 38 U/L (ref 15–41)
Albumin: 3.4 g/dL — ABNORMAL LOW (ref 3.5–5.0)
Alkaline Phosphatase: 84 U/L (ref 38–126)
Anion gap: 8 (ref 5–15)
BUN: 19 mg/dL (ref 8–23)
CO2: 24 mmol/L (ref 22–32)
Calcium: 8.8 mg/dL — ABNORMAL LOW (ref 8.9–10.3)
Chloride: 107 mmol/L (ref 98–111)
Creatinine, Ser: 0.9 mg/dL (ref 0.44–1.00)
GFR, Estimated: >60 mL/min (ref >60)
Glucose, Bld: 176 mg/dL — ABNORMAL HIGH (ref 70–99)
Potassium: 4.1 mmol/L (ref 3.5–5.1)
Sodium: 139 mmol/L (ref 135–145)
Total Bilirubin: 0.6 mg/dL (ref 0.0–1.2)
Total Protein: 6.3 g/dL — ABNORMAL LOW (ref 6.5–8.1)

## 2023-07-11 LAB — I-STAT CHEM 8, ED
BUN: 20 mg/dL (ref 8–23)
Calcium, Ion: 1.23 mmol/L (ref 1.15–1.40)
Chloride: 107 mmol/L (ref 98–111)
Creatinine, Ser: 0.9 mg/dL (ref 0.44–1.00)
Glucose, Bld: 173 mg/dL — ABNORMAL HIGH (ref 70–99)
HCT: 36 % (ref 36.0–46.0)
Hemoglobin: 12.2 g/dL (ref 12.0–15.0)
Potassium: 4 mmol/L (ref 3.5–5.1)
Sodium: 139 mmol/L (ref 135–145)
TCO2: 25 mmol/L (ref 22–32)

## 2023-07-11 LAB — I-STAT VENOUS BLOOD GAS, ED
Acid-base deficit: 5 mmol/L — ABNORMAL HIGH (ref 0.0–2.0)
Bicarbonate: 23.6 mmol/L (ref 20.0–28.0)
Calcium, Ion: 1.24 mmol/L (ref 1.15–1.40)
HCT: 35 % — ABNORMAL LOW (ref 36.0–46.0)
Hemoglobin: 11.9 g/dL — ABNORMAL LOW (ref 12.0–15.0)
O2 Saturation: 98 %
Potassium: 4 mmol/L (ref 3.5–5.1)
Sodium: 139 mmol/L (ref 135–145)
TCO2: 25 mmol/L (ref 22–32)
pCO2, Ven: 59.8 mmHg (ref 44–60)
pH, Ven: 7.204 — ABNORMAL LOW (ref 7.25–7.43)
pO2, Ven: 124 mmHg — ABNORMAL HIGH (ref 32–45)

## 2023-07-11 LAB — RESP PANEL BY RT-PCR (RSV, FLU A&B, COVID)  RVPGX2
Influenza A by PCR: NEGATIVE
Influenza B by PCR: NEGATIVE
Resp Syncytial Virus by PCR: NEGATIVE
SARS Coronavirus 2 by RT PCR: NEGATIVE

## 2023-07-11 LAB — TROPONIN I (HIGH SENSITIVITY)
Troponin I (High Sensitivity): 31 ng/L — ABNORMAL HIGH (ref <18)
Troponin I (High Sensitivity): 46 ng/L — ABNORMAL HIGH (ref <18)

## 2023-07-11 LAB — BRAIN NATRIURETIC PEPTIDE: B Natriuretic Peptide: 805.7 pg/mL — ABNORMAL HIGH (ref 0.0–100.0)

## 2023-07-11 MED ORDER — ALBUTEROL SULFATE (2.5 MG/3ML) 0.083% IN NEBU
2.5000 mg | INHALATION_SOLUTION | RESPIRATORY_TRACT | Status: DC
  Administered 2023-07-11 – 2023-07-12 (×8): 2.5 mg via RESPIRATORY_TRACT
  Filled 2023-07-11 (×9): qty 3

## 2023-07-11 MED ORDER — BUDESONIDE 0.25 MG/2ML IN SUSP
0.2500 mg | Freq: Two times a day (BID) | RESPIRATORY_TRACT | Status: DC
Start: 2023-07-11 — End: 2023-07-17
  Administered 2023-07-11 – 2023-07-16 (×12): 0.25 mg via RESPIRATORY_TRACT
  Filled 2023-07-11 (×12): qty 2

## 2023-07-11 MED ORDER — SODIUM CHLORIDE 0.9 % IV SOLN
1.0000 g | Freq: Two times a day (BID) | INTRAVENOUS | Status: DC
  Administered 2023-07-11 – 2023-07-12 (×3): 1 g via INTRAVENOUS
  Filled 2023-07-11 (×4): qty 10

## 2023-07-11 MED ORDER — ALBUTEROL SULFATE (2.5 MG/3ML) 0.083% IN NEBU
10.0000 mg/h | INHALATION_SOLUTION | Freq: Once | RESPIRATORY_TRACT | Status: AC
  Administered 2023-07-11: 10 mg/h via RESPIRATORY_TRACT
  Filled 2023-07-11: qty 12

## 2023-07-11 MED ORDER — ALBUTEROL SULFATE (2.5 MG/3ML) 0.083% IN NEBU: 2.5000 mg | INHALATION_SOLUTION | RESPIRATORY_TRACT | Status: DC

## 2023-07-11 MED ORDER — ENOXAPARIN SODIUM 40 MG/0.4ML IJ SOSY
40.0000 mg | PREFILLED_SYRINGE | INTRAMUSCULAR | Status: DC
  Administered 2023-07-11 – 2023-07-27 (×17): 40 mg via SUBCUTANEOUS
  Filled 2023-07-11 (×17): qty 0.4

## 2023-07-11 MED ORDER — ARFORMOTEROL TARTRATE 15 MCG/2ML IN NEBU
15.0000 ug | INHALATION_SOLUTION | Freq: Two times a day (BID) | RESPIRATORY_TRACT | Status: DC
  Administered 2023-07-11 – 2023-07-16 (×12): 15 ug via RESPIRATORY_TRACT
  Filled 2023-07-11 (×12): qty 2

## 2023-07-11 MED ORDER — AZITHROMYCIN 250 MG PO TABS
250.0000 mg | ORAL_TABLET | Freq: Every day | ORAL | Status: DC
  Administered 2023-07-12: 250 mg via ORAL
  Filled 2023-07-11 (×2): qty 1

## 2023-07-11 MED ORDER — AMLODIPINE BESYLATE 10 MG PO TABS
10.0000 mg | ORAL_TABLET | Freq: Every day | ORAL | Status: DC
  Administered 2023-07-11 – 2023-07-21 (×11): 10 mg via ORAL
  Filled 2023-07-11 (×11): qty 1

## 2023-07-11 MED ORDER — ALBUTEROL SULFATE (2.5 MG/3ML) 0.083% IN NEBU
10.0000 mg/h | INHALATION_SOLUTION | Freq: Once | RESPIRATORY_TRACT | Status: AC
  Administered 2023-07-11: 10 mg/h via RESPIRATORY_TRACT
  Filled 2023-07-11: qty 3

## 2023-07-11 MED ORDER — METHYLPREDNISOLONE SODIUM SUCC 125 MG IJ SOLR
125.0000 mg | Freq: Two times a day (BID) | INTRAMUSCULAR | Status: AC
  Administered 2023-07-11 – 2023-07-12 (×3): 125 mg via INTRAVENOUS
  Filled 2023-07-11 (×4): qty 2

## 2023-07-11 MED ORDER — ALBUTEROL SULFATE (2.5 MG/3ML) 0.083% IN NEBU
5.0000 mg/h | INHALATION_SOLUTION | RESPIRATORY_TRACT | Status: DC
Start: 2023-07-11 — End: 2023-07-11

## 2023-07-11 MED ORDER — BIOTENE DRY MOUTH MT LIQD: 15.0000 mL | OROMUCOSAL | Status: DC | PRN

## 2023-07-11 MED ORDER — CITALOPRAM HYDROBROMIDE 20 MG PO TABS
20.0000 mg | ORAL_TABLET | Freq: Every day | ORAL | Status: DC
  Administered 2023-07-11 – 2023-07-26 (×16): 20 mg via ORAL
  Filled 2023-07-11 (×17): qty 1

## 2023-07-11 MED ORDER — FUROSEMIDE 10 MG/ML IJ SOLN
20.0000 mg | Freq: Once | INTRAMUSCULAR | Status: AC
  Administered 2023-07-11: 20 mg via INTRAVENOUS
  Filled 2023-07-11: qty 2

## 2023-07-11 MED ORDER — PANTOPRAZOLE SODIUM 20 MG PO TBEC
20.0000 mg | DELAYED_RELEASE_TABLET | Freq: Every day | ORAL | Status: DC
  Administered 2023-07-11 – 2023-07-26 (×16): 20 mg via ORAL
  Filled 2023-07-11 (×16): qty 1

## 2023-07-11 MED ORDER — SIMVASTATIN 20 MG PO TABS
20.0000 mg | ORAL_TABLET | Freq: Every day | ORAL | Status: DC
  Administered 2023-07-11 – 2023-07-26 (×16): 20 mg via ORAL
  Filled 2023-07-11 (×16): qty 1

## 2023-07-11 MED ORDER — REVEFENACIN 175 MCG/3ML IN SOLN
175.0000 ug | Freq: Every day | RESPIRATORY_TRACT | Status: DC
  Administered 2023-07-11 – 2023-07-16 (×6): 175 ug via RESPIRATORY_TRACT
  Filled 2023-07-11 (×6): qty 3

## 2023-07-11 MED ORDER — ENSURE ENLIVE PO LIQD
237.0000 mL | Freq: Three times a day (TID) | ORAL | Status: DC
  Administered 2023-07-11 – 2023-07-27 (×32): 237 mL via ORAL
  Filled 2023-07-11: qty 237

## 2023-07-11 MED ORDER — SODIUM CHLORIDE 0.9 % IV SOLN
500.0000 mg | Freq: Once | INTRAVENOUS | Status: AC
  Administered 2023-07-11: 500 mg via INTRAVENOUS
  Filled 2023-07-11: qty 5

## 2023-07-11 MED ORDER — ASPIRIN 81 MG PO TBEC
81.0000 mg | DELAYED_RELEASE_TABLET | Freq: Every day | ORAL | Status: DC
  Administered 2023-07-11 – 2023-07-26 (×16): 81 mg via ORAL
  Filled 2023-07-11 (×16): qty 1

## 2023-07-11 MED ORDER — SODIUM CHLORIDE 0.9 % IV SOLN
2.0000 g | Freq: Once | INTRAVENOUS | Status: AC
  Administered 2023-07-11: 2 g via INTRAVENOUS
  Filled 2023-07-11: qty 12.5

## 2023-07-11 MED ORDER — HYDROXYZINE HCL 25 MG PO TABS
50.0000 mg | ORAL_TABLET | Freq: Three times a day (TID) | ORAL | Status: DC | PRN
  Administered 2023-07-11 – 2023-07-12 (×3): 50 mg via ORAL
  Filled 2023-07-11 (×3): qty 2

## 2023-07-11 MED ORDER — VANCOMYCIN HCL IN DEXTROSE 1-5 GM/200ML-% IV SOLN
1000.0000 mg | Freq: Once | INTRAVENOUS | Status: AC
  Administered 2023-07-11: 1000 mg via INTRAVENOUS
  Filled 2023-07-11: qty 200

## 2023-07-11 MED ORDER — ENSURE ENLIVE PO LIQD: 237.0000 mL | Freq: Two times a day (BID) | ORAL | Status: DC

## 2023-07-11 NOTE — ED Provider Notes (Signed)
 Patient signed out to me at 0700 by Dr. Dolan Freiberg pending labs and reassessment with plan for admission.  In short this is an 88 year old female with a past medical history of COPD, aortic stenosis status post TAVR, complete heart block status post pacemaker placement, hypertension that presented to the emergency department with respiratory distress.  She was initially on CPAP prior to arrival and was given 2 DuoNebs, Solu-Medrol, mag and IM epi and route.  She was transitioned to BiPAP here and was started on hour-long continuous neb.  Patient's labs do show leukocytosis of 18 and mildly elevated troponin of 31.  BNP is 805.  Chest x-ray does show worsening right lower lobe infiltrate with possible vascular congestion.  She will be started on broad-spectrum antibiotics with concern for hospital-acquired pneumonia with her recent admission.   On my evaluation, the patient recently finished her first continuous neb.  She still has tight breath sounds with end expiratory wheeze diffusely.  She reports that she does feel somewhat improved.  Respiratory did attempt to trial her off and she again had increased work of breathing and was placed back on BiPAP.  Will be given a second continuous neb.  The patient also has elevated BNP of 800 and some mild vascular congestion on chest x-ray and will be given IV Lasix.  The patient was also complaining of swelling to her left arm.  She states this started yesterday.  She does have edema and swelling to her left forearm compared to the right, no tenderness, 2+ radial pulses, no overlying skin changes.  Will DVT ultrasound of the left upper extremity.  Patient will require admission and internal medicine was called.    Kingsley, Nikolos Billig K, DO 07/11/23 [phone]

## 2023-07-11 NOTE — ED Provider Notes (Signed)
 Emergency Department Provider Note   I have reviewed the triage vital signs and the nursing notes.   HISTORY  Chief Complaint Respiratory Distress   HPI [name] is a 88 y.o. female past history of COPD on nasal cannula oxygen at home presents the emergency department from rehab with shortness of breath and wheezing.  EMS arrived on scene to find the patient in respiratory distress with diaphoresis and agonal respirations.  They reported near absent lung sounds initially.  Patient had already been given 2 nebulizer treatments by fire.  They gave 2 additional DuoNebs, started CPAP, patient was given IM epinephrine and Solu-Medrol prior to arrival.  They report significant improvement in route.  She is no longer diaphoretic and seems to be breathing easier.  Patient reports some improvement in her breathing symptoms.  She denies chest pain.  Unable to give significant/detailed history due to respiratory distress.  Level 5 caveat applies.   Past Medical History:  Diagnosis Date   Anxiety    Stable   ASCUS on Pap smear 11/15/2009   Carotid artery disease (HCC)    1-39% RICA stenosis and 60-79% LICA stenosis on pre TAVR dopplers    Cataract    COPD (chronic obstructive pulmonary disease) (HCC)    Depression    Duodenal ulcer due to Helicobacter pylori 06/28/2020   Endometrial polyp 11/15/2009   Endometrioid adenocarcinoma    H/O varicella    Heart murmur    Hemorrhoids    History of measles, mumps, or rubella    Hypercholesterolemia    Hypertension    Menopausal symptoms 06/13/2010   Ovarian cyst 11/15/2009   PMB (postmenopausal bleeding) 11/15/2009   Presence of permanent cardiac pacemaker 10/20/2018   Vaginal bleeding     Review of Systems  Level 5 caveat: Respiratory distress  ____________________________________________   PHYSICAL EXAM:  VITAL SIGNS: ED Triage Vitals  Encounter Vitals Group     BP 07/11/23 0619 (!) 142/61     Pulse Rate 07/11/23 0619 96      Resp 07/11/23 0619 (!) 21     Temp 07/11/23 0619 (!) 96.4 F (35.8 C)     Temp Source 07/11/23 0619 Tympanic     SpO2 07/11/23 0619 95 %     Weight 07/11/23 0622 105 lb 13.1 oz (48 kg)     Height 07/11/23 0622 5\' 3"  (1.6 m)   Constitutional: Alert but in notable respiratory distress. Able to answer questions in 1-2 word responses.  Eyes: Conjunctivae are normal.  Head: Atraumatic. Nose: No congestion/rhinnorhea. Mouth/Throat: Mucous membranes are moist.   Neck: No stridor. Cardiovascular: Normal rate, regular rhythm. Good peripheral circulation. Grossly normal heart sounds.   Respiratory: Increased respiratory effort.  No retractions. Lungs with good aeration at the apices. Tighter at the bases bilaterally with end-expiratory wheeze.  Gastrointestinal: Soft and nontender. No distention.  Musculoskeletal: No gross deformities of extremities. Neurologic:  Normal speech and language.  Skin:  Skin is warm, dry and intact. No rash noted.  ____________________________________________   LABS (all labs ordered are listed, but only abnormal results are displayed)  Labs Reviewed  COMPREHENSIVE METABOLIC PANEL WITH GFR - Abnormal; Notable for the following components:      Result Value   Glucose, Bld 176 (*)    Calcium 8.8 (*)    Total Protein 6.3 (*)    Albumin 3.4 (*)    All other components within normal limits  BRAIN NATRIURETIC PEPTIDE - Abnormal; Notable for the following components:  B Natriuretic Peptide 805.7 (*)    All other components within normal limits  CBC WITH DIFFERENTIAL/PLATELET - Abnormal; Notable for the following components:   WBC 18.2 (*)    RBC 3.74 (*)    Hemoglobin 11.4 (*)    HCT 35.2 (*)    RDW 15.9 (*)    Neutro Abs 14.3 (*)    Monocytes Absolute 1.2 (*)    Abs Immature Granulocytes 0.11 (*)    All other components within normal limits  I-STAT CHEM 8, ED - Abnormal; Notable for the following components:   Glucose, Bld 173 (*)    All other  components within normal limits  I-STAT VENOUS BLOOD GAS, ED - Abnormal; Notable for the following components:   pH, Ven 7.204 (*)    pO2, Ven 124 (*)    Acid-base deficit 5.0 (*)    HCT 35.0 (*)    Hemoglobin 11.9 (*)    All other components within normal limits  TROPONIN I (HIGH SENSITIVITY) - Abnormal; Notable for the following components:   Troponin I (High Sensitivity) 31 (*)    All other components within normal limits  TROPONIN I (HIGH SENSITIVITY) - Abnormal; Notable for the following components:   Troponin I (High Sensitivity) 46 (*)    All other components within normal limits  RESP PANEL BY RT-PCR (RSV, FLU A&B, COVID)  RVPGX2  CULTURE, BLOOD (ROUTINE X 2) W REFLEX TO ID PANEL  CULTURE, BLOOD (ROUTINE X 2) W REFLEX TO ID PANEL  RESPIRATORY PANEL BY PCR   ____________________________________________  EKG   EKG Interpretation Date/Time:  Saturday July 11 2023 06:17:23 EDT Ventricular Rate:  96 PR Interval:  189 QRS Duration:  163 QT Interval:  415 QTC Calculation: 525 R Axis:   -46  Text Interpretation: AV PACED RHYTHM Confirmed by Abby Hocking [phone]) on 07/11/2023 6:27:32 AM        ____________________________________________  RADIOLOGY  UE VENOUS DUPLEX (7am - 7pm) Result Date: 07/11/2023 UPPER VENOUS STUDY  Patient Name:  [name]  Date of Exam:   07/11/2023 Medical Rec #: [id number]       Accession #:    [id number] Date of Birth: January 28, 1936        Patient Gender: F Patient Age:   55 years Exam Location:  [facility] Procedure:      VAS US  UPPER EXTREMITY VENOUS DUPLEX Referring Phys: Celesta Coke --------------------------------------------------------------------------------  Indications: Edema Comparison Study: No prior study on file Performing Technologist: Carleene Chase RVS  Examination Guidelines: A complete evaluation includes B-mode imaging, spectral Doppler, color Doppler, and power Doppler as needed of all accessible portions of each  vessel. Bilateral testing is considered an integral part of a complete examination. Limited examinations for reoccurring indications may be performed as noted.  Right Findings: +----------+------------+---------+-----------+----------+-------+ RIGHT     CompressiblePhasicitySpontaneousPropertiesSummary +----------+------------+---------+-----------+----------+-------+ Subclavian               Yes       Yes                      +----------+------------+---------+-----------+----------+-------+  Left Findings: +----------+------------+---------+-----------+----------+-------+ LEFT      CompressiblePhasicitySpontaneousPropertiesSummary +----------+------------+---------+-----------+----------+-------+ IJV           Full       Yes       Yes                      +----------+------------+---------+-----------+----------+-------+ Subclavian  Yes       Yes                      +----------+------------+---------+-----------+----------+-------+ Axillary      Full       Yes       Yes                      +----------+------------+---------+-----------+----------+-------+ Brachial      Full       Yes       Yes                      +----------+------------+---------+-----------+----------+-------+ Radial        Full                                          +----------+------------+---------+-----------+----------+-------+ Ulnar         Full                                          +----------+------------+---------+-----------+----------+-------+ Cephalic      Full                                          +----------+------------+---------+-----------+----------+-------+ Basilic       Full                                          +----------+------------+---------+-----------+----------+-------+  Summary:  Right: No evidence of thrombosis in the subclavian.  Left: No evidence of deep vein thrombosis in the upper extremity. No evidence of  superficial vein thrombosis in the upper extremity.  *See table(s) above for measurements and observations.  Diagnosing physician: Irvin Mantel Electronically signed by Irvin Mantel on 07/11/2023 at 2:03:39 PM.    Final    DG Chest Portable 1 View Result Date: 07/11/2023 CLINICAL DATA:  Shortness of breath and respiratory distress. EXAM: PORTABLE CHEST 1 VIEW COMPARISON:  06/27/2023 FINDINGS: Lungs are hyperexpanded. Interval increase in bibasilar airspace disease, compatible with atelectasis or infiltrate. Probable tiny bilateral pleural effusions. Increased vascular congestion. The cardiopericardial silhouette is within normal limits for size. Status post TAVR. Left dual lead permanent pacemaker remains in place. Telemetry leads overlie the chest. IMPRESSION: 1. Interval increase in bibasilar airspace disease, compatible with atelectasis or infiltrate. 2. Increased vascular congestion with probable tiny bilateral pleural effusions. Electronically Signed   By: Donnal Fusi M.D.   On: 07/11/2023 07:06    ____________________________________________   PROCEDURES  Procedure(s) performed:   Procedures  CRITICAL CARE Performed by: Roberts Ching Total critical care time: 45 minutes Critical care time was exclusive of separately billable procedures and treating other patients. Critical care was necessary to treat or prevent imminent or life-threatening deterioration. Critical care was time spent personally by me on the following activities: development of treatment plan with patient and/or surrogate as well as nursing, discussions with consultants, evaluation of patient's response to treatment, examination of patient, obtaining history from patient or surrogate, ordering and performing treatments and interventions, ordering and review of laboratory studies, ordering and  review of radiographic studies, pulse oximetry and re-evaluation of patient's condition.  Abby Hocking, MD Emergency  Medicine  ____________________________________________   INITIAL IMPRESSION / ASSESSMENT AND PLAN / ED COURSE  Pertinent labs & imaging results that were available during my care of the patient were reviewed by me and considered in my medical decision making (see chart for details).   This patient is Presenting for Evaluation of SOB, which does require a range of treatment options, and is a complaint that involves a high risk of morbidity and mortality.  The Differential Diagnoses includes but is not exclusive to acute coronary syndrome, aortic dissection, pulmonary embolism, cardiac tamponade, community-acquired pneumonia, pericarditis, musculoskeletal chest wall pain, etc.   Critical Interventions-    Medications  enoxaparin (LOVENOX) injection 40 mg (40 mg Subcutaneous Given 07/11/23 0943)  revefenacin (YUPELRI) nebulizer solution 175 mcg (175 mcg Nebulization Given 07/11/23 0922)  arformoterol (BROVANA) nebulizer solution 15 mcg (15 mcg Nebulization Given 07/11/23 1954)  budesonide (PULMICORT) nebulizer solution 0.25 mg (0.25 mg Nebulization Given 07/11/23 1954)  methylPREDNISolone sodium succinate (SOLU-MEDROL) 125 mg/2 mL injection 125 mg (125 mg Intravenous Given 07/11/23 2256)  cefTRIAXone (ROCEPHIN) 1 g in sodium chloride 0.9 % 100 mL IVPB (0 g Intravenous Stopped 07/11/23 2245)  azithromycin (ZITHROMAX) tablet 250 mg (has no administration in time range)  amLODipine (NORVASC) tablet 10 mg (10 mg Oral Given 07/11/23 2201)  aspirin EC tablet 81 mg (81 mg Oral Given 07/11/23 2201)  citalopram (CELEXA) tablet 20 mg (20 mg Oral Given 07/11/23 2201)  pantoprazole (PROTONIX) EC tablet 20 mg (20 mg Oral Given 07/11/23 2201)  simvastatin (ZOCOR) tablet 20 mg (20 mg Oral Given 07/11/23 2201)  albuterol (PROVENTIL) (2.5 MG/3ML) 0.083% nebulizer solution 2.5 mg (2.5 mg Nebulization Given 07/11/23 2337)  feeding supplement (ENSURE ENLIVE / ENSURE PLUS) liquid 237 mL (237 mLs Oral Given 07/11/23 2033)   antiseptic oral rinse (BIOTENE) solution 15 mL (has no administration in time range)  hydrOXYzine (ATARAX) tablet 50 mg (50 mg Oral Given 07/12/23 0134)  albuterol (PROVENTIL) (2.5 MG/3ML) 0.083% nebulizer solution (10 mg/hr Nebulization Given 07/11/23 0627)  vancomycin (VANCOCIN) IVPB 1000 mg/200 mL premix (0 mg Intravenous Stopped 07/11/23 0837)  ceFEPIme (MAXIPIME) 2 g in sodium chloride 0.9 % 100 mL IVPB (0 g Intravenous Stopped 07/11/23 0801)  albuterol (PROVENTIL) (2.5 MG/3ML) 0.083% nebulizer solution (10 mg/hr Nebulization Given 07/11/23 0801)  furosemide (LASIX) injection 20 mg (20 mg Intravenous Given 07/11/23 0801)  azithromycin (ZITHROMAX) 500 mg in sodium chloride 0.9 % 250 mL IVPB (0 mg Intravenous Stopped 07/11/23 1059)    Reassessment after intervention:  symptoms improved.    I did obtain Additional Historical Information from EMS.   I decided to review pertinent External Data, and in summary patient discharged from the IM teaching service on 4/4 with similar presentation.    Clinical Laboratory Tests Ordered, included VBG shows respiratory acidosis with pH 7.2.  Leukocytosis to 18 without significant anemia.  No AKI on Chem-8.   Radiologic Tests Ordered, included CXR. I independently interpreted the images and agree with radiology interpretation.   Cardiac Monitor Tracing which shows NSR   Social Determinants of Health Risk patient with a smoking history.   Medical Decision Making: Summary:  Patient presents emergency department shortness of breath.  She arrives in distress but seems to be improving significantly after transition to BiPAP in the ED.  Plan for additional continuous nebulizer treatment.  Patient has already received Solu-Medrol prior to arrival.  Reevaluation with update and  discussion with patient at 6:45AM. Patient looking well on BiPAP. Color improved and mental status is sharp. Speaking more clearly.   Care transferred to Dr. Nora Beal pending remaining  labs and admit.   Patient's presentation is most consistent with acute presentation with potential threat to life or bodily function.   Disposition: admit  ____________________________________________  FINAL CLINICAL IMPRESSION(S) / ED DIAGNOSES  Final diagnoses:  COPD exacerbation (HCC)  Acute respiratory failure, unspecified whether with hypoxia or hypercapnia (HCC)  HCAP (healthcare-associated pneumonia)    Note:  This document was prepared using Dragon voice recognition software and may include unintentional dictation errors.  Abby Hocking, MD, [facility] Emergency Medicine    Arshdeep Bolger, Shereen Dike, MD 07/12/23 [phone]

## 2023-07-11 NOTE — Progress Notes (Signed)
 Patient removed from Bipap around 0815 and placed on [place] 3L. CAT Albuterol treatment stopped in [facility] and patient moved to room ED 04. CAT Albuterol treatment restarted at 0839 on 8L.

## 2023-07-11 NOTE — H&P (Signed)
 Date: 07/11/2023         Patient Name:  [name] MRN: [id number]  DOB: 07/25/1935 Age / Sex: 88 y.o., female   PCP: Laneta Pintos, MD         Medical Service: Internal Medicine Teaching Service         Attending Physician: Dr. Broadus Canes Whitney Hams, MD    First Contact: Dr. Jayson Myquan [place] Pager: [phone]  Second Contact: Dr. Hoyt Macleod Pager: [phone]                 Chief Concern: I feel like I cannot breathe  History of Present Illness: This 88 year old has severe COPD.  She was recently discharged after treatment in the hospital for COPD exacerbation which required a stay in the ICU.  She was doing well for a couple of days after discharge but then began to notice more shortness of breath with her physical therapy sessions at [facility] living [facility] rehab.  The last time she attempted therapy was Thursday, but was unable to complete it because of such severe shortness of breath.  She did not do any therapy yesterday.  Overnight last night she woke up with really bad shortness of breath, EMS was called and she was transported to the hospital.  She has not had any fevers.  She has a chronic cough that is productive of thick sputum but this is not new.  In the emergency department BiPAP and continuous nebulizer was started for respiratory distress with wheezing.  Workup was notable for mild acidosis, borderline high CO2 on her VBG.  Vancomycin and cefepime were administered.  A BNP was elevated.  Lasix was given through her IV.  She is feeling somewhat better by the time I went down to see her.  We had a discussion, along with her niece about advance care planning, as documented below.  Allergies: No Known Allergies  Past Medical History: Chronic hypoxic respiratory failure on 2 to 3 L of oxygen continuously.  History of aortic stenosis status post TAVR.  Complete heart block status post pacemaker placement.  Protein calorie malnutrition.  Medications: Albuterol as  needed Amlodipine 10 mg Aspirin 81 mg daily Celexa 20 mg daily Hydroxyzine grams 3 times daily as needed for anxiety Pantoprazole 20 mg Prednisone 20 mg, 5-day course started 4/10 to run through 07/14/2023 Simvastatin 20 mg Trelegy 1 puff daily Was on Lasix 20 mg but this was discontinued after last hospitalization.  Surgical History: Reviewed, pertinent for history of TAVR and pacemaker implantation, right and left heart cath in 2020 showed nonobstructive CAD and mild pulmonary hypertension with mean pulmonary artery pressure of 25 mmHg.  Family History:  Reviewed, not pertinent to today's admission.  Social History:  Was living at home prior to her last hospitalization.  Her niece is her closest family support.  She is a current smoker, smokes quarter pack of cigarettes per day and has smoked for 55 years.  Physical Exam: Blood pressure (!) 120/37, pulse 92, temperature 97.8 F (36.6 C), temperature source Oral, resp. rate (!) 22, height 5\' 3"  (1.6 m), weight 48 kg, SpO2 100%.  Frail-appearing Heart rate is normal, rhythm is regular, strong radial pulses, some jugular venous distention especially on expiration Breathing is labored, she is tachypneic, her lungs have loud wheezing throughout all fields Skin is warm and dry Asymmetric edema that is pitting of the left upper extremity Alert and oriented, speech is normal sounding, no facial asymmetry  EKG:  AV paced rhythm at 96 bpm, similar to last EKG.  Labs: Mild acidosis, borderline high CO2 on VBG Low albumin BNP 805.7 Troponin 31 => 46 Leukocytosis, WBC 18.2 with left shift Hemoglobin 11.4 Flu, RSV, COVID-negative  Images and other studies: Chest x-ray essentially stable from the last, maybe with some increased vascular congestion and bibasilar atelectasis  Assessment & Plan:  VETRA SHINALL is a 88 y.o. with severe COPD and history of HFpEF who presents with dyspnea and wheezing is admitted for acute on chronic  hypoxic and hypercapnic respiratory failure  Principal Problem:   Respiratory failure with hypoxia and hypercapnia (HCC) Improving, but still quite sick.  I think this is from a COPD exacerbation, may be volume overload is contributing a little bit.  I doubt she has a new pneumonia.  She will need ambulatory SpO2 again to determine outpatient oxygen needs.  Treat COPD and volume overload per below.  Admit to progressive care unit, last time she was here for similar problem she deteriorated and required ICU level care, I think her risk for deterioration is higher than average.  Active Problems:   COPD exacerbation (HCC) Improving.  Somewhat decreased work of breathing since admission.  Not sure what triggered this.  Will treat empirically for CAP, but overall index of suspicion for pneumonia is pretty low.  I do not think she has a healthcare associated pneumonia.  IV steroids.  Will give her LAMA/LABA/ICS via the nebulized route.  Will do albuterol for her.  BiPAP as needed. - Methylprednisolone 125 mg IV every 12 hours through tomorrow morning - Albuterol 2.5 mg via nebulized route every 4 hours while awake - Revefenacin, arformoterol, budesonide via neb - BiPAP as needed for increased work of breathing - Ceftriaxone and azithromycin for 3 days    Volume overload Do not think this is contributing as much to her respiratory failure.  Has asymmetric upper extremity edema, left worse than right, niece states that her ankles were little bit swollen for the last few days which is unusual for her.  She may have some vascular congestion on her chest x-ray.  She has some jugular venous distention but this is confounded because of her high intrathoracic pressures on exhalation right now.  She does have a history of heart failure with preserved ejection fraction and mild pulmonary hypertension, probably group 3 to her lung disease.  She got a dose of IV Lasix.  Her outpatient Lasix was stopped during her last  admission because she was doing well without it but I will restart it here tomorrow. - Restart Lasix 20 mg p.o. daily tomorrow    Protein calorie malnutrition (HCC) Seen by dietitians during last admission.  Per their recommendations continue regular diet, supplement shakes 3 times daily between meals.    Left arm swelling Probably from volume overload.  Ultrasound does not show any DVT.    Anxiety Was a barrier to BiPAP treatment last time.  Will continue outpatient Celexa for this.  Hold off on hydroxyzine for now, she is doing okay with the BiPAP on her face at the time of my evaluation.    Advance care planning I recommended against intubation and CPR as I feel that she would likely not survive to leave the hospital if she were to undergo positive pressure ventilation by endotracheal tube.  She and her niece took these recommendations pretty well, have not made any decisions in this regard yet.  I offered a palliative care consult for more  goals of care discussions and advance care planning, which they were amenable to.  Level of care: Progressive for BiPAP as needed Diet: N.p.o. until she is off the BiPAP for a while IVF: N/A VTE: enoxaparin (LOVENOX) injection 40 mg Start: 07/11/23 0900 Code: Full pending advance care planning discussions Surrogate: Niece, Liane Redman  Signed: Adria Hopkins MD 07/11/2023, 10:32 AM Pager: [phone]

## 2023-07-11 NOTE — ED Triage Notes (Signed)
 Pt BIB [facility] from [facility]. Pt woke up w/sudden on set of SOB; facility administered 2 nebs; no improvement 80% on 3L [place]. On EMS arrival pt was lethergic and diaphoretic; CPAP applied.   125mg  Solumedrol  2g Mag .3mg  IM Epi 2 Duonebs

## 2023-07-11 NOTE — ED Notes (Signed)
 Pts niece notified of pts arrival to the department.

## 2023-07-11 NOTE — ED Notes (Signed)
 Pt resting comfortably at this time. Pt reports ease in WOB.

## 2023-07-11 NOTE — Progress Notes (Signed)
 VASCULAR LAB    Left upper extremity venous duplex has been performed.  See CV proc for preliminary results.   Keeleigh Terris, RVT 07/11/2023, 9:27 AM

## 2023-07-11 NOTE — ED Notes (Signed)
 PT OTF in no new onset distress at this time.

## 2023-07-12 DIAGNOSIS — Z7189 Other specified counseling: Secondary | ICD-10-CM | POA: Diagnosis not present

## 2023-07-12 DIAGNOSIS — Z515 Encounter for palliative care: Secondary | ICD-10-CM | POA: Diagnosis not present

## 2023-07-12 DIAGNOSIS — K219 Gastro-esophageal reflux disease without esophagitis: Secondary | ICD-10-CM

## 2023-07-12 DIAGNOSIS — I5030 Unspecified diastolic (congestive) heart failure: Secondary | ICD-10-CM | POA: Insufficient documentation

## 2023-07-12 LAB — RESPIRATORY PANEL BY PCR

## 2023-07-12 LAB — GLUCOSE, CAPILLARY: Glucose-Capillary: 104 mg/dL — ABNORMAL HIGH (ref 70–99)

## 2023-07-12 MED ORDER — FUROSEMIDE 10 MG/ML IJ SOLN
40.0000 mg | Freq: Two times a day (BID) | INTRAMUSCULAR | Status: AC
  Administered 2023-07-12 (×2): 40 mg via INTRAVENOUS
  Filled 2023-07-12 (×2): qty 4

## 2023-07-12 MED ORDER — GUAIFENESIN ER 600 MG PO TB12
600.0000 mg | ORAL_TABLET | Freq: Two times a day (BID) | ORAL | Status: DC
  Administered 2023-07-12 – 2023-07-15 (×7): 600 mg via ORAL
  Filled 2023-07-12 (×7): qty 1

## 2023-07-12 MED ORDER — BUSPIRONE HCL 5 MG PO TABS
7.5000 mg | ORAL_TABLET | Freq: Two times a day (BID) | ORAL | Status: DC
  Administered 2023-07-12 – 2023-07-13 (×2): 7.5 mg via ORAL
  Filled 2023-07-12 (×2): qty 2

## 2023-07-12 MED ORDER — BUSPIRONE HCL 5 MG PO TABS
7.5000 mg | ORAL_TABLET | Freq: Two times a day (BID) | ORAL | Status: DC
  Administered 2023-07-12: 7.5 mg via ORAL
  Filled 2023-07-12: qty 2

## 2023-07-12 MED ORDER — HYDROXYZINE HCL 25 MG PO TABS
25.0000 mg | ORAL_TABLET | Freq: Three times a day (TID) | ORAL | Status: DC | PRN
  Administered 2023-07-13 – 2023-07-15 (×4): 25 mg via ORAL
  Filled 2023-07-12 (×5): qty 1

## 2023-07-12 MED ORDER — ALBUTEROL SULFATE (2.5 MG/3ML) 0.083% IN NEBU
2.5000 mg | INHALATION_SOLUTION | Freq: Four times a day (QID) | RESPIRATORY_TRACT | Status: DC
  Administered 2023-07-12: 2.5 mg via RESPIRATORY_TRACT
  Filled 2023-07-12: qty 3

## 2023-07-12 MED ORDER — PREDNISONE 20 MG PO TABS
40.0000 mg | ORAL_TABLET | Freq: Every day | ORAL | Status: AC
  Administered 2023-07-13 – 2023-07-15 (×3): 40 mg via ORAL
  Filled 2023-07-12 (×3): qty 2

## 2023-07-12 NOTE — Hospital Course (Addendum)
 [name] is a 88 y.o. with severe COPD not on O2 at baseline, aortic stenosis s/p TAVR, complete heart block with pacemaker and HFpEF who was discharged 8 days ago after being discharged from the ICU for a COPD exacerbation who now presents with dyspnea and wheezing is admitted for acute on chronic hypoxic and hypercapnic respiratory failure secondary to COPD and HFpEF exacerbation with fluid overload.  Respiratory failure with hypoxia and hypercapnia (HCC) COPD exacerbation (HCC) The patient was discharged 8 days ago for a COPD exacerbation to [facility]. She presented via EMS with severe shortness of breath, tachypnea, diffuse wheezing and LUE edema. She was initially placed on BiPAP then weaned to 3 L [place]. In the ED, she received nebulized albuterol  Q4H, IV furosemide , vancomycin  and cefepime  on top of her home nebulized budesonide , arformoterol , and revefenacin . Her labs demonstrated BNP of 805.7, WBC of 18.2, hsTroponin 31, and negative Flu A/B, RSV and COVID swab. CXR showed increase in bibasilar airspace disease. The etiology of her exacerbation is not known but procalcitonin on hospital day 2 is suggestive of bacterial pneumonia. She was transferred to the progressive unit on 3 L [place]. She received 3 days of ceftriaxone  1 g BID and 5 days of azithromycin  500 mg QD for CAP coverage. She had intermittent episodes of SpO2 decreases that required BiPAP. As her anxiety improved, she did not require BiPAP treatment. Her oxygen requirement decreased from a peak of 8 L by nasal cannula, to 1 L. At time of discharge, her oxygen requirement was room air, though she preferred 1L [place] for her anxiety, with ambulation oxygenation requirement of 2L.   Heart failure with preserved ejection fraction (HCC) Volume overload Left arm swelling Patient presented with picture consistent with a component of HF contribution. At presentation, her distal LUE was tense and edematous. She received IV and  oral furosemide  for symptoms of volume overload. She also received acetazolamide  for contraction alkalosis. At time of discharge her left arm was swollen but much improved than on admission. She was discharged on lasix  40 mg daily and spironolactone  25 mg daily.   Anxiety In the ED, anxiety was a barrier to BiPAP treatment with complaints of claustrophobia like symptoms. After discussing benefits of treatment she tolerated BiPAP well. After this, she still continued to have anxiety and panic attacks throughout her hospitalization. She continued citalopram  while inpatient and requested PRN hydroxyzine  regularly. She was started on buspirone  7.5 mg BID, which was eventually increased to 15 mg TID. She was trialed on a one time dose of morphine  5 mg to see if this could address her anxiety and air hunger which helped when provided at night. As her anxiety improved with buspirone , the morphine  was discontinued. Her anxiety regarding movement continued to improve as she was diuresed. She was able to tolerate walking every day on 2L of oxygen at time of discharge.     Hypertension Discontinued amlodipine  because of low diastolic pressures. Transitioned to spironolactone  25 mg for benefits to BP, HFpEF and diuresis.   Normocytic anemia Iron  deficiency  Hb stable at 10.8 on the day of discharge. She received IV iron  while inpatient and would recommend repeat iron  studies as an outpatient.   Advance care planning Upon admission, patient was full code. After initial recommendations, she was amenable to having further discussion with palliative care team. Palliative care was consulted and resulted in decision to declare DNR/DNI. She will follow up with palliative care as an outpatient.

## 2023-07-12 NOTE — Progress Notes (Signed)
   07/12/23 0841  Adult Ventilator Settings  Vent Type Servo i  Vent Mode PCV;BIPAP  FiO2 (%) 50 %  IPAP 16 cmH20  EPAP 8 cmH20   Pt had an episode of SOB after her treatments. Pt dropped to mid 80s on 5L [place]. Pt placed on BiPAP on above settings and is tolerating well at this time.

## 2023-07-12 NOTE — Progress Notes (Signed)
 Called to pt's room by RN stating pt's Spo2 had decreased to low 80's on 4L and had not recovered despite increasing Fio2. Pt's WOB was also increased at this time. This RT placed pt on Bipap with settings she had previously been on in ED. Spo2 immediately increased to 90's, WOB decreased, and pt appears more comfortable. BBS equal throughout w/ rhonchi in upper lobes Will cont to monitor

## 2023-07-12 NOTE — Progress Notes (Signed)
 Patient called c/o SOB. Patient was having Resp. Distress. Resp up and O2 sats down to 82% on 3 liters of O2 Resp. Laboured. Inspir. And expir wheezes Called Resp Therapy Patient was put on BIPAP. Aaron AasAtarax 50 mg p.o.given for anxiety. Michele Ahle and Marine Sia with Rapid response aware. Text page Dr. Winston Hawking

## 2023-07-12 NOTE — Evaluation (Signed)
 Physical Therapy Evaluation Patient Details Name: [name] MRN: [id number] DOB: 06/10/1935 Today's Date: 07/12/2023  History of Present Illness  Pt is a 88 y.o. female admitted 07/11/23 for respiratory failure with hypoxia and hypercapnia. Pt presented with SOB and increased WOB. Workup was notable for mild acidosis, borderline high CO2 on her VBG.  PMH of COPD on 2-3L O2 at baseline, COVID Jan 2025, TAVR, PPM, GIB, endometrial CA, HTN, HLD, CAD, mild pulmonary hypertension. Of note, recent hospitalization for COPD exacerbation which required a stay in the ICU.   Clinical Impression  Pt admitted with above diagnosis. PTA, pt was at [facility] receiving therapy services. She required 1+ assist for functional mobility using a RW and for ADLs. Pt currently with functional limitations due to the deficits listed below (see PT Problem List). She has generalized deconditioning, impaired cardiopulmonary status, and reduced activity tolerance. Pt required supervision-CGA for bed mobility and sit-to-stand. She was able to tolerate static stance with unilateral UE support for a maximum of ~77mins. She desaturated to 83% SpO2 on first stand requiring bump up to 9L and ~67mins of seated rest to recover to 89% SpO2. Pt had a productive cough of thick sputum and blood, RN notified. She desaturated to 85% SpO2 on second stand, required bump up to 5L and seated rest for recovery within ~82mins to 90% SpO2. Pt will benefit from acute skilled PT to increase her independence and safety with mobility to allow discharge. Recommend post-acute rehab <3 hours/day of therapy.      If plan is discharge home, recommend the following: A little help with walking and/or transfers;A lot of help with bathing/dressing/bathroom;Assist for transportation;Help with stairs or ramp for entrance;Assistance with cooking/housework   Can travel by private vehicle   No    Equipment Recommendations Wheelchair (measurements PT);Wheelchair  cushion (measurements PT)  Recommendations for Other Services       Functional Status Assessment Patient has had a recent decline in their functional status and/or demonstrates limited ability to make significant improvements in function in a reasonable and predictable amount of time     Precautions / Restrictions Precautions Precautions: Fall Recall of Precautions/Restrictions: Intact Precaution/Restrictions Comments: Watch SpO2 Restrictions Weight Bearing Restrictions Per Provider Order: No      Mobility  Bed Mobility Overal bed mobility: Needs Assistance Bed Mobility: Supine to Sit     Supine to sit: Supervision, HOB elevated Sit to supine: Supervision, HOB elevated   General bed mobility comments: Pt sat up on L side of bed with increased time and supervision for safety and line management.    Transfers Overall transfer level: Needs assistance Equipment used: 2 person hand held assist, 1 person hand held assist Transfers: Sit to/from Stand Sit to Stand: Contact guard assist           General transfer comment: Pt stood from lowest bed height x3 without an AD. She maintained static stance for ~38mins before fatiguing and desaturation. Pt required prolonged seated rest between stands. Good eccentric control with sitting.    Ambulation/Gait               General Gait Details: Deferred d/t fatigue  Stairs            Wheelchair Mobility     Tilt Bed    Modified Rankin (Stroke Patients Only)       Balance Overall balance assessment: Needs assistance Sitting-balance support: No upper extremity supported, Feet supported Sitting balance-Leahy Scale: Fair Sitting balance - Comments: Pt  was able to don socks in long sitting. She sat EOB with CGA.   Standing balance support: Bilateral upper extremity supported, Single extremity supported, During functional activity Standing balance-Leahy Scale: Fair Standing balance comment: Pt stood with 1-2 HHA. She  maintained static stance with unilateral UE support. No LOB.                             Pertinent Vitals/Pain Pain Assessment Pain Assessment: No/denies pain    Home Living Family/patient expects to be discharged to:: Private residence Living Arrangements: Alone Available Help at Discharge: Family;Neighbor;Available PRN/intermittently Type of Home: House Home Access: Stairs to enter   Entrance Stairs-Number of Steps: 3   Home Layout: One level Home Equipment: Rolling Walker (2 wheels);BSC/3in1 Additional Comments: Pt has been at [facility] since the first week of April to recieve therapy. She wasn't able to participate in the last couple of PT sessions d/t SOB.    Prior Function Prior Level of Function : Independent/Modified Independent;Driving;Needs assist             Mobility Comments: Prior to late March 2025, pt was ambulating without an AD and driving locally. Since April 2025, pt uses a RW for mobility and requires 1+ assist and takes increased time. ADLs Comments: Prior to late March 2025, she was indep with ADLs, likes to get in tub, manages IADLs. Her friends/family assist with bringing bulk groceries and if pt needs small items in between will drive and go to store nearby. Since April 2025, pt has required 1+ assist and takes increased time.     Extremity/Trunk Assessment   Upper Extremity Assessment Upper Extremity Assessment: Defer to OT evaluation    Lower Extremity Assessment Lower Extremity Assessment: Generalized weakness    Cervical / Trunk Assessment Cervical / Trunk Assessment: Normal  Communication   Communication Communication: Impaired Factors Affecting Communication: Hearing impaired    Cognition Arousal: Alert Behavior During Therapy: Anxious   PT - Cognitive impairments: Safety/Judgement, Awareness                       PT - Cognition Comments: Pt A,Ox4. She reports a hx of panic attacks and  states she is getting worked up easier. She required increased time to perform all mobility, moderate encouragement to participate, and re-direction to help calm her down. Following commands: Intact       Cueing Cueing Techniques: Verbal cues     General Comments General comments (skin integrity, edema, etc.): Pt was greeted on 4L O2, finishing up a breathing treatment. She desaturated on initial stand to 83% SpO2, required prolonged seated rest break ~13mins and bump up to 9L to recover to 89% SpO2. Pt had a productive cough of thick sputum and blood, contained in tissue, RN notified. Able to titrate back down to 4L. She desaturated to 85% on second stand, required bump up to 5L and seated rest for recovery within ~70mins to 90%. At end of session, pt returned to 4L with SpO2 93%.    Exercises     Assessment/Plan    PT Assessment Patient needs continued PT services  PT Problem List Decreased activity tolerance;Decreased balance;Decreased mobility;Cardiopulmonary status limiting activity;Decreased strength       PT Treatment Interventions Gait training;Functional mobility training;Therapeutic activities;Therapeutic exercise;Patient/family education;Balance training;DME instruction;Stair training    PT Goals (Current goals can be found in the Care Plan section)  Acute Rehab  PT Goals Patient Stated Goal: Return Home PT Goal Formulation: With patient/family Time For Goal Achievement: 07/26/23 Potential to Achieve Goals: Fair    Frequency Min 2X/week     Co-evaluation PT/OT/SLP Co-Evaluation/Treatment: Yes Reason for Co-Treatment: To address functional/ADL transfers PT goals addressed during session: Mobility/safety with mobility;Balance OT goals addressed during session: ADL's and self-care       AM-PAC PT "6 Clicks" Mobility  Outcome Measure Help needed turning from your back to your side while in a flat bed without using bedrails?: A Little Help needed moving from lying on  your back to sitting on the side of a flat bed without using bedrails?: A Little Help needed moving to and from a bed to a chair (including a wheelchair)?: A Little Help needed standing up from a chair using your arms (e.g., wheelchair or bedside chair)?: A Little Help needed to walk in hospital room?: Total Help needed climbing 3-5 steps with a railing? : Total 6 Click Score: 14    End of Session Equipment Utilized During Treatment: Oxygen Activity Tolerance: Patient limited by fatigue Patient left: in bed;with call bell/phone within reach;with family/visitor present Nurse Communication: Mobility status;Other (comment) (Desaturation and Productive thick sputum with blood in tissue at sink for her to see.) PT Visit Diagnosis: Other abnormalities of gait and mobility (R26.89);Muscle weakness (generalized) (M62.81)    Time: 4098-1191 PT Time Calculation (min) (ACUTE ONLY): 36 min   Charges:   PT Evaluation $PT Eval Moderate Complexity: 1 Mod   PT General Charges $$ ACUTE PT VISIT: 1 Visit         Glenford Lanes, PT, DPT Acute Rehabilitation Services Office: [phone] Secure Chat Preferred  Riva Chester 07/12/2023, 1:41 PM

## 2023-07-12 NOTE — Evaluation (Signed)
 Occupational Therapy Evaluation Patient Details Name: [name] MRN: [id number] DOB: [birth year] Today's Date: 07/12/2023   History of Present Illness   Pt is a 88 y.o. female admitted 07/11/23 for respiratory failure with hypoxia and hypercapnia. Pt presented with SOB and increased WOB. Workup was notable for mild acidosis, borderline high CO2 on her VBG.  PMH of COPD on 2-3L O2 at baseline, COVID Jan 2025, TAVR, PPM, GIB, endometrial CA, HTN, HLD, CAD, mild pulmonary hypertension. Of note, recent hospitalization for COPD exacerbation which required a stay in the ICU.     Clinical Impressions Pt presents to [facility] from [facility] where she was receiving short-term rehab. Pt now presents with decreased activity tolerance, impaired cardiopulmonary status, generalized B UE weakness, decreased balance and decreased safety and independence with functional tasks. Pt largely demonstrating ability to complete ADLs Independent to Min assist +1 to +2 with frequent rest breaks. Upon arrival, pt on 4L O2 and finishing a breathing treatment. Pt desaturated on initial stand to 83% SpO2 on 4L, requiring prolonged seated rest break ~88mins and bump up to 9L to recover to 89% SpO2. Able to titrate back down to 4L. Pt then desaturated to 85% on second stand and required bump up to 5L and seated rest for recovery to 90% within ~57mins. RN notified. Pt participated well in session and is motivated to participate in skilled rehab. Pt will benefit from acute skilled OT services to address deficits outlined below and increase safety and independence with functional tasks. Post acute discharge, pt will benefit from continued intensive inpatient skilled rehab services < 3 hours per day to maximize rehab potential.     If plan is discharge home, recommend the following:   A little help with walking and/or transfers;A little help with bathing/dressing/bathroom;Assistance with cooking/housework;Help with stairs or ramp for  entrance;Assist for transportation     Functional Status Assessment   Patient has had a recent decline in their functional status and demonstrates the ability to make significant improvements in function in a reasonable and predictable amount of time.     Equipment Recommendations   None recommended by OT (Pt has needed equipment)     Recommendations for Other Services         Precautions/Restrictions   Precautions Precautions: Fall Recall of Precautions/Restrictions: Intact Precaution/Restrictions Comments: Watch SpO2 Restrictions Weight Bearing Restrictions Per Provider Order: No     Mobility Bed Mobility Overal bed mobility: Needs Assistance Bed Mobility: Supine to Sit, Sit to Supine     Supine to sit: Supervision, HOB elevated Sit to supine: Supervision, HOB elevated   General bed mobility comments: Pt sat up on L side of bed with increased time and supervision for safety and line management.    Transfers Overall transfer level: Needs assistance Equipment used: 2 person hand held assist, 1 person hand held assist Transfers: Sit to/from Stand Sit to Stand: Contact guard assist           General transfer comment: Pt stood from lowest bed height x3 without an AD. She maintained static stance for ~80mins before fatiguing and desaturation. Pt required prolonged seated rest between stands. Good eccentric control with sitting.      Balance Overall balance assessment: Needs assistance Sitting-balance support: No upper extremity supported, Feet supported Sitting balance-Leahy Scale: Fair Sitting balance - Comments: Pt was able to don socks in long sitting. She sat EOB with CGA.   Standing balance support: Bilateral upper extremity supported, Single extremity supported, During functional activity  Standing balance-Leahy Scale: Fair Standing balance comment: Pt stood with 1-2 HHA. She maintained static stance with unilateral UE support. No LOB.                            ADL either performed or assessed with clinical judgement   ADL Overall ADL's : Needs assistance/impaired Eating/Feeding: Independent;Sitting   Grooming: Set up;Sitting   Upper Body Bathing: Contact guard assist;Supervision/ safety;Sitting   Lower Body Bathing: Contact guard assist;Minimal assistance;Sitting/lateral leans;Sit to/from stand;+2 for safety/equipment   Upper Body Dressing : Set up;Sitting   Lower Body Dressing: Contact guard assist;Sit to/from stand;+2 for safety/equipment Lower Body Dressing Details (indicate cue type and reason): donned socks in long sitting; other LB dressing completed at EOB   Toilet Transfer Details (indicate cue type and reason): deferred this session for pt/therapist safety as pt with O2 desat in standing EOB Toileting- Clothing Manipulation and Hygiene: Contact guard assist;Minimal assistance;Sit to/from stand;+2 for safety/equipment Toileting - Clothing Manipulation Details (indicate cue type and reason): at EOB       General ADL Comments: Pt with decreased activity tolerance, fatiguing quickly during tasks. Pt also limited by O2 desat during standing this session.     Vision Baseline Vision/History: 1 Wears glasses Ability to See in Adequate Light: 0 Adequate (with glasses) Patient Visual Report: No change from baseline       Perception         Praxis         Pertinent Vitals/Pain Pain Assessment Pain Assessment: No/denies pain     Extremity/Trunk Assessment Upper Extremity Assessment Upper Extremity Assessment: Right hand dominant;Generalized weakness   Lower Extremity Assessment Lower Extremity Assessment: Defer to PT evaluation   Cervical / Trunk Assessment Cervical / Trunk Assessment: Normal   Communication Communication Communication: Impaired Factors Affecting Communication: Hearing impaired   Cognition Arousal: Alert Behavior During Therapy: Anxious, WFL for tasks  assessed/performed Cognition: No apparent impairments             OT - Cognition Comments: Pt AAOx4 and pleasant throughout session. Pt reports ongoing issues with anxiety and may benefit from education in strategies to assisting in managing feelings of anxiety.                 Following commands: Intact       Cueing  General Comments   Cueing Techniques: Verbal cues  Upon arrival, pt on 4L O2 and finishing a breathing treatment. She desaturated on initial stand to 83% SpO2 on 4L, requiring prolonged seated rest break ~28mins and bump up to 9L to recover to 89% SpO2. Pt had a productive cough of thick sputum and blood, contained in tissue, RN notified. Able to titrate back down to 4L. She then desaturated to 85% on second stand and required bump up to 5L and seated rest for recovery within ~67mins. Pt's neice present throughout session.   Exercises     Shoulder Instructions      Home Living Family/patient expects to be discharged to:: Private residence Living Arrangements: Alone Available Help at Discharge: Family;Neighbor;Available PRN/intermittently Type of Home: House Home Access: Stairs to enter Entrance Stairs-Number of Steps: 3   Home Layout: One level     Bathroom Shower/Tub: Chief Strategy Officer: Standard     Home Equipment: Agricultural consultant (2 wheels);BSC/3in1   Additional Comments: Pt has been at [facility] since the first week of April to recieve short-term  therapy. She wasn't able to participate in the last two rehab sessions d/t SOB.      Prior Functioning/Environment Prior Level of Function : Independent/Modified Independent;Driving;Needs assist             Mobility Comments: Prior to late March 2025, pt was ambulating without an AD. Since April 2025, pt uses a RW for mobility and requires 1+ assist and takes increased time. ADLs Comments: Prior to late March 2025, she was indep with ADLs and IADLs and prefers to  take a bath in the tub. Her friends/family assist with bringing bulk groceries and if pt needs small items in between will drive and go to store nearby. Since April 2025, pt has required 1+ assist and increased time for ADLs    OT Problem List: Decreased activity tolerance;Impaired balance (sitting and/or standing);Decreased strength;Cardiopulmonary status limiting activity   OT Treatment/Interventions: Self-care/ADL training;Therapeutic exercise;Energy conservation;DME and/or AE instruction;Therapeutic activities;Patient/family education;Balance training      OT Goals(Current goals can be found in the care plan section)   Acute Rehab OT Goals Patient Stated Goal: to be able to do more and breathe better OT Goal Formulation: With patient Time For Goal Achievement: 07/26/23 Potential to Achieve Goals: Fair ADL Goals Pt Will Perform Grooming: with supervision;standing Pt Will Perform Lower Body Bathing: with supervision;sitting/lateral leans;sit to/from stand Pt Will Perform Lower Body Dressing: with modified independence;sitting/lateral leans;sit to/from stand Pt Will Transfer to Toilet: with supervision;ambulating;regular height toilet;grab bars (with least restrictive AD) Additional ADL Goal #1: Patient will demonstrate understanding through teach back of education in strategies tomanage symptoms of anxiety (i.e. breathing techniques, meditation techniques, sensory strategies) to improve general mental health and quality of life.   OT Frequency:  Min 2X/week    Co-evaluation PT/OT/SLP Co-Evaluation/Treatment: Yes Reason for Co-Treatment: To address functional/ADL transfers PT goals addressed during session: Mobility/safety with mobility;Balance OT goals addressed during session: ADL's and self-care      AM-PAC OT "6 Clicks" Daily Activity     Outcome Measure Help from another person eating meals?: None Help from another person taking care of personal grooming?: A Little Help  from another person toileting, which includes using toliet, bedpan, or urinal?: A Little Help from another person bathing (including washing, rinsing, drying)?: A Little Help from another person to put on and taking off regular upper body clothing?: A Little Help from another person to put on and taking off regular lower body clothing?: A Little 6 Click Score: 19   End of Session Equipment Utilized During Treatment: Oxygen;Gait belt Nurse Communication: Mobility status;Other (comment) (Pt coughing up thick sputum and blood; pt desating upon standing with need to titraite O2 throughout session for pt recovery)  Activity Tolerance: Patient tolerated treatment well;Treatment limited secondary to medical complications (Comment) (limited by O2 desat in standing requiring increased O2 to recover) Patient left: in bed;with call bell/phone within reach;with family/visitor present  OT Visit Diagnosis: Other abnormalities of gait and mobility (R26.89);Muscle weakness (generalized) (M62.81);Other (comment) (decreased activity tolerance)                Time: 8295-6213 OT Time Calculation (min): 36 min Charges:  OT General Charges $OT Visit: 1 Visit OT Evaluation $OT Eval Moderate Complexity: 1 Mod OT Treatments $Self Care/Home Management : 8-22 mins  Ilya Ess "Darral Ellis., OTR/L, MA Acute Rehab [phone]   Walt Gunner 07/12/2023, 5:22 PM

## 2023-07-12 NOTE — Progress Notes (Cosign Needed Addendum)
 Subjective:  Overnight, patient's SpO2 dropped to 82% on 3L [place] and required BiPAP. She received hydralazine for anxiety and RPP collected. This morning the patient expressed concerns regarding her anxiety. She has complaints of "getting hot" which evolve into panic attacks when she is unable to clear sputum. She is interested in a way to clear her sputum. She also expressed concerns about her sputum being pink in color.  Objective:  Vital signs in last 24 hours: Vitals:   07/12/23 0153 07/12/23 0200 07/12/23 0356 07/12/23 0623  BP:  (!) 143/51 (!) 132/55   Pulse:  97 (!) 108   Resp:  (!) 23 (!) 23   Temp:   97.7 F (36.5 C)   TempSrc:   Oral   SpO2: 96% 97% 98% 92%  Weight:  54.3 kg    Height:       Weight change: 6.3 kg  Intake/Output Summary (Last 24 hours) at 07/12/2023 7829 Last data filed at 07/12/2023 5621 Gross per 24 hour  Intake 910.29 ml  Output --  Net 910.29 ml   GEN: Frail appearing, no acute distress, L distal UE tense and edematous CV: Irregular rhythm with distant heart sounds PULM: Paroxysmal hacking cough productive of pink tinged sputum. End expiratory wheezing noted on bilateral upper lung lobes. Middle lobe and lower lobes CTAB.  PSYCH: Calm at present, but raises concerns multiple times about her anxiety and panic attacks  Assessment/Plan:  Patient Summary [name] is a 88 y.o. with severe COPD and history of HFpEF who presents with dyspnea and wheezing is admitted for acute on chronic hypoxic and hypercapnic respiratory failure.  Principal Problem   Respiratory failure with hypoxia and hypercapnia (HCC)   COPD exacerbation (HCC) Patient continues to require additional support from her baseline, requiring BiPAP overnight for a drop in SpO2 on 3L O2. Was discharged on 2L O2 at the end of March 2025. Requiring additional respiratory support compared to home requirements. Unknown cause of exacerbation. WBC was elevated at presentation, but RPP and 4  plex qPCR negative collected last night make infectious cause less likely. CXR demonstrated bibasilar airspace disease. Current presentation most likely secondary to COPD exacerbation with some contribution of HFpEF. Last night required BiPAP.  Continue to wean O2 as tolerated, but increase O2 flow rate or use BiPAP as needed for oxygenation. Continue LAMA/LABA/ICS, finish IV methylprednisolone today, ceftriaxone and azithromycin for 3 days. Complaints today about clearing mucus with some pink sputum so plant to start guaifenesin. - Methylprednisolone 125 mg IV every 12 hours through this morning - Albuterol 2.5 mg via nebulized route every 4 hours while awake - Revefenacin, arformoterol, budesonide via neb - BiPAP as needed for increased work of breathing - Ceftriaxone and azithromycin day 2 of 3 - Guaifenesin 600 mg BID  Active Problems   Heart failure with preserved ejection fraction (HCC)   Volume overload   Left arm swelling Potential mixed picture of heart failure on top of COPD exacerbation. ProBNP up to 805.7. Asymmetric upper extremity edema, left worse than right. US  shows no DVT in upper extremities. - Furosemide 40 mg IV, BID    Anxiety Patient has concerns regarding anxiety surrounding breathing. She thinks her hydroxyzine is insufficient and requests benzodiazepines, but we will not provide given danger to patient. - Continue home citalopram 20 mg every day  - Continue PRN hydroxyzine 50 mg TID    Advance care planning Patient is currently full code. She has been counseled about making changes  to this when she was admitted and took the recommendation to be DNR/DNI well. She was offered palliative consult for more thorough exploration of goals of care discussions. - Palliative care consult pending  Stable chronic problems   Protein calorie malnutrition (HCC) Seen by dietitians during her last admission recommended 3 proteins shakes every day. - Continue protein shake  supplementation    Hypertension - Continue home amlodipine 10 mg   LOS: 1 day   Ferdinand Houseman, Medical Student 07/12/2023, 6:27 AM

## 2023-07-13 DIAGNOSIS — Z515 Encounter for palliative care: Secondary | ICD-10-CM | POA: Diagnosis not present

## 2023-07-13 DIAGNOSIS — Z7189 Other specified counseling: Secondary | ICD-10-CM

## 2023-07-13 LAB — CBC
HCT: 31.5 % — ABNORMAL LOW (ref 36.0–46.0)
Hemoglobin: 10.6 g/dL — ABNORMAL LOW (ref 12.0–15.0)
MCH: 30.7 pg (ref 26.0–34.0)
MCHC: 33.7 g/dL (ref 30.0–36.0)
MCV: 91.3 fL (ref 80.0–100.0)
Platelets: 221 10*3/uL (ref 150–400)
RBC: 3.45 MIL/uL — ABNORMAL LOW (ref 3.87–5.11)
RDW: 16 % — ABNORMAL HIGH (ref 11.5–15.5)
WBC: 12.2 10*3/uL — ABNORMAL HIGH (ref 4.0–10.5)
nRBC: 0 % (ref 0.0–0.2)

## 2023-07-13 LAB — BASIC METABOLIC PANEL WITH GFR
Anion gap: 9 (ref 5–15)
BUN: 33 mg/dL — ABNORMAL HIGH (ref 8–23)
CO2: 25 mmol/L (ref 22–32)
Calcium: 8.8 mg/dL — ABNORMAL LOW (ref 8.9–10.3)
Chloride: 104 mmol/L (ref 98–111)
Creatinine, Ser: 0.84 mg/dL (ref 0.44–1.00)
GFR, Estimated: >60 mL/min (ref >60)
Glucose, Bld: 114 mg/dL — ABNORMAL HIGH (ref 70–99)
Potassium: 3.8 mmol/L (ref 3.5–5.1)
Sodium: 138 mmol/L (ref 135–145)

## 2023-07-13 LAB — PROCALCITONIN: Procalcitonin: 0.72 ng/mL

## 2023-07-13 MED ORDER — IPRATROPIUM BROMIDE 0.02 % IN SOLN
0.5000 mg | Freq: Four times a day (QID) | RESPIRATORY_TRACT | Status: DC | PRN
  Administered 2023-07-14 – 2023-07-26 (×21): 0.5 mg via RESPIRATORY_TRACT
  Filled 2023-07-13 (×23): qty 2.5

## 2023-07-13 MED ORDER — ALBUTEROL SULFATE (2.5 MG/3ML) 0.083% IN NEBU
2.5000 mg | INHALATION_SOLUTION | Freq: Four times a day (QID) | RESPIRATORY_TRACT | Status: DC
  Administered 2023-07-13 (×2): 2.5 mg via RESPIRATORY_TRACT
  Filled 2023-07-13 (×2): qty 3

## 2023-07-13 MED ORDER — AZITHROMYCIN 500 MG PO TABS
500.0000 mg | ORAL_TABLET | Freq: Every day | ORAL | Status: AC
  Administered 2023-07-13 – 2023-07-15 (×3): 500 mg via ORAL
  Filled 2023-07-13 (×3): qty 1

## 2023-07-13 MED ORDER — SODIUM CHLORIDE 0.9 % IV SOLN
2.0000 g | INTRAVENOUS | Status: AC
  Administered 2023-07-13: 2 g via INTRAVENOUS
  Filled 2023-07-13: qty 20

## 2023-07-13 MED ORDER — BUSPIRONE HCL 5 MG PO TABS
10.0000 mg | ORAL_TABLET | Freq: Two times a day (BID) | ORAL | Status: DC
  Administered 2023-07-13 – 2023-07-15 (×5): 10 mg via ORAL
  Filled 2023-07-13 (×5): qty 2

## 2023-07-13 NOTE — Progress Notes (Signed)
 Pt breathing normally and stating she feels better from this afternoon when she had to be placed on the BIPAP. Pt tolerating [place] on 3L at this time, BIPAP on standby in room.

## 2023-07-13 NOTE — Progress Notes (Signed)
 Mobility Specialist Progress Note:   07/13/23 1551  Mobility  Activity  (bed level exercises)  Level of Assistance Standby assist, set-up cues, supervision of patient - no hands on  Assistive Device None  Activity Response Tolerated well  Mobility Referral Yes  Mobility visit 1 Mobility  Mobility Specialist Start Time (ACUTE ONLY) 1413  Mobility Specialist Stop Time (ACUTE ONLY) 1423  Mobility Specialist Time Calculation (min) (ACUTE ONLY) 10 min   Pt received in bed strongly refusing OOB activity. Pt states she is "afraid of a panic attack from standing up and moving". Despite max encouragement pt only agreeable to perform bed level exercises. Call bell in reach. All needs met. Daughter in room  Inetta Manes Mobility Specialist  Please contact vis Secure Chat or  Rehab Office [phone]

## 2023-07-13 NOTE — Progress Notes (Signed)
 During nebulizer treatment pt became tachycardiac, using accessory muscles and increased WOB. Placed back on BIPAP. RN at bedside.

## 2023-07-13 NOTE — Consult Note (Signed)
 Palliative Medicine Inpatient Consult Note  Consulting Provider: Rocky Morel, DO   Reason for consult:   Palliative Care Consult Services Palliative Medicine Consult  Reason for Consult? severe COPD with multiple recent admissions, goals of care   07/13/2023  HPI:  Per intake H&P --> LEANETTE EUTSLER is a 88 y.o. with severe COPD and history of HFpEF who presents with dyspnea and wheezing is admitted for acute on chronic hypoxic and hypercapnic respiratory failure.  The Palliative care team has been asked to get involved for further foals of care conversations.  Clinical Assessment/Goals of Care:  *Please note that this is a verbal dictation therefore any spelling or grammatical errors are due to the "Dragon Medical One" system interpretation.  I have reviewed medical records including EPIC notes, labs and imaging, received report from bedside RN, assessed the patient who is lying in bed in NAD.    I met with Yareth and her niece, Misty Stanley to further discuss diagnosis prognosis, GOC, EOL wishes, disposition and options.   I introduced Palliative Medicine as specialized medical care for people living with serious illness. It focuses on providing relief from the symptoms and stress of a serious illness. The goal is to improve quality of life for both the patient and the family.  Medical History Review and Understanding:  A review of Umaiza's past medical history significant for COPD, heart failure, TAVR, and pacemaker was completed.  Social History:  Gilberta shares that she is from [facility] originally thought was lived in [place] for a number of years.  Taite has never been married nor has she had children though she considers her niece, Misty Stanley like a daughter.  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  She shares that she loves dancing predominantly to country music.  Japji is a woman of faith practicing within the [facility] denomination.  Functional and  Nutritional State:  Preceding hospitalization Jullian was living in a single-family home able to complete all B ADLs.  She recently had a stent at [facility] given her prior hospitalization.  Advance Directives:  A detailed discussion was had today regarding advanced directives.  Bonita Quin shares that Misty Stanley her niece is her HCPOA.  Code Status:  Concepts specific to code status, artifical feeding and hydration, continued IV antibiotics and rehospitalization was had.  The difference between a aggressive medical intervention path  and a palliative comfort care path for this patient at this time was had.   Encouraged patient/family to consider DNR/DNI status understanding evidenced based poor outcomes in similar hospitalized patient, as the cause of arrest is likely associated with advanced chronic/terminal illness rather than an easily reversible acute cardio-pulmonary event. I explained that DNR/DNI does not change the medical plan and it only comes into effect after a person has arrested (died).  It is a protective measure to keep [place] from harming the patient in their last moments of life.  Abbie was agreeable to DNR/DNI with understanding that patient would not receive CPR, defibrillation, ACLS medications, or intubation.   A Most form was reviewed in the presence of Wilhelmine and her niece.   Provided  "Hard Choices for [employer]" booklet.   Discussion:  A review of Shawna's acute on chronic medical conditions was completed.  We discussed COPD and the chronic progressive nature of the disease.  We reviewed at some point she may be optimized as best as possible medically though her disease may still progress.    We reviewed when disease burden progressive  possibly leading to recurring hospitalizations then it might be worthwhile to consider additional support by hospice.  I described hospice as a service for patients who have a life expectancy of 6 months or less. The goal of hospice is the  preservation of dignity and quality at the end phases of life. Under hospice care, the focus changes from curative to symptom relief.   Momoko shares the hope for improvement and the ability to go back to her home though she understands the "what if's" of her disease.   Namine is open to OP Palliative care support at this time.   Discussed the importance of continued conversation with family and their  medical providers regarding overall plan of care and treatment options, ensuring decisions are within the context of the patients values and GOCs.  Decision Maker: Liane Redman (Niece): [phone] (Mobile)   SUMMARY OF RECOMMENDATIONS   DNAR/DNI  Discussed a MOST form  Open and honest conversations held in the setting of patient's advanced COPD  Patient is open to OP Palliative support on discharge  Patients goal is to be functional without significant SOB  Ongoing PMT support  Code Status/Advance Care Planning: DNAR/DNI  Palliative Prophylaxis:  Aspiration, Bowel Regimen, Delirium Protocol, Frequent Pain Assessment, Oral Care, Palliative Wound Care, and Turn Reposition  Additional Recommendations (Limitations, Scope, Preferences): Continue present care  Psycho-social/Spiritual:  Desire for further Chaplaincy support: [facility] Additional Recommendations: Education on COPD progression   Prognosis: High 12 month mortality risk in the setting of recurrent rehospitalization's, chronic disease burden, and frailty.  Discharge Planning: Discharge once medically optimized per primary team.  Vitals:   07/13/23 0845 07/13/23 0921  BP:  (!) 151/54  Pulse:  63  Resp:  20  Temp:  (!) 97.5 F (36.4 C)  SpO2: 93% 96%    Intake/Output Summary (Last 24 hours) at 07/13/2023 1240 Last data filed at 07/13/2023 0856 Gross per 24 hour  Intake 820 ml  Output 1400 ml  Net -580 ml   Last Weight  Most recent update: 07/13/2023  3:31 AM    Weight  55 kg (121 lb 4.1 oz)             Gen: Chronically ill-appearing elderly Caucasian female HEENT: moist mucous membranes CV: Regular rate and rhythm  PULM: On 4 L breathing is even and unlabored ABD: soft/nontender  EXT: No edema  Neuro: Alert and oriented x3   PPS: 50%   This conversation/these recommendations were discussed with patient primary care team, Dr. Broadus Canes  Billing based on MDM: High ______________________________________________________ Camille Cedars [facility] Palliative Medicine Team Team Cell Phone: [phone] Please utilize secure chat with additional questions, if there is no response within 30 minutes please call the above phone number  Palliative Medicine Team providers are available by phone from 7am to 7pm daily and can be reached through the team cell phone.  Should this patient require assistance outside of these hours, please call the patient's attending physician.

## 2023-07-13 NOTE — TOC Progression Note (Signed)
 Transition of Care [facility]) - Progression Note    Patient Details  Name: [name] MRN: [id number] Date of Birth: 24-Jul-1935  Transition of Care [facility]) CM/SW Contact  Valery Gaucher, [place] Phone Number: 07/13/2023, 5:11 PM  Clinical Narrative:     Sent message to [facility] to confirmed bed availability- waiting on response   Patient will need authorization for short term rehab once bed is confirmed.  Liddie Reel, MSW, LCSW Clinical Social Worker         Expected Discharge Plan and Services                                               Social Determinants of Health (SDOH) Interventions SDOH Screenings   Food Insecurity: No Food Insecurity (07/13/2023)  Housing: Low Risk  (07/13/2023)  Transportation Needs: No Transportation Needs (06/23/2023)  Utilities: Not At Risk (06/23/2023)  Alcohol Screen: Low Risk  (08/05/2022)  Depression (PHQ2-9): Low Risk  (01/22/2023)  Financial Resource Strain: Low Risk  (08/05/2022)  Physical Activity: Inactive (08/05/2022)  Social Connections: Socially Isolated (06/23/2023)  Stress: No Stress Concern Present (08/05/2022)  Tobacco Use: High Risk (06/23/2023)    Readmission Risk Interventions     No data to display

## 2023-07-13 NOTE — Progress Notes (Cosign Needed Addendum)
 Subjective:  This morning [name] had a panic attack because she was worried about her breathing and was placed on BiPAP. She says that her breathing is the primary cause of her panic attacks, but will also have panic attacks when she is not able to reach something, like the TV remote. She said that today she was able to cough up her sputum better. The sputum is pink-tinged to dark red, but not bright red. She has not required BiPAP for 24 hr until this morning's episode. In the afternoon, she says that her anxiety is her main problem that she wants solved, because she is too scared to engage in physical activity when she could have a panic attack.  Objective:  Vital signs in last 24 hours: Vitals:   07/12/23 2343 07/13/23 0203 07/13/23 0331 07/13/23 0359  BP: (!) 128/56 (!) 117/47  (!) 130/51  Pulse: (!) 109 (!) 102 (!) 105 (!) 105  Resp: (!) 21 20 20 20   Temp: 98.3 F (36.8 C) 98.2 F (36.8 C)  98.2 F (36.8 C)  TempSrc: Oral Oral  Oral  SpO2: 92% 96% 100% 92%  Weight:   55 kg   Height:       Weight change: 0.7 kg  Intake/Output Summary (Last 24 hours) at 07/13/2023 0606 Last data filed at 07/13/2023 0144 Gross per 24 hour  Intake 940 ml  Output 1350 ml  Net -410 ml   GEN: No acute distress, L sided implant over L clavicle, BiPAP in place CV: Distant heart sounds PULM: Upper lung fields on front with end expiratory wheezing, on back, diffuse wheezing with crackles at lung bases with BiPAP PSYCH: Mood "good", Calm, congruent with affect EXT: L forearm swelling reduced  Assessment/Plan:  Patient Summary [name] is a 88 y.o. with severe COPD, not requiring O2 at home, and history of HFpEF who presents 8 days after being discharged for a COPD exacerbation which required ICU care, who presented with dyspnea and wheezing was admitted for acute on chronic hypoxic and hypercapnic respiratory failure.  Principal Problem   Respiratory failure with hypoxia and  hypercapnia (HCC)   COPD exacerbation (HCC) Patient continues to require additional support from her baseline, requiring BiPAP overnight for a drop in SpO2 on 3L O2. Was discharged on 2L O2 at the end of March 2025. Requiring additional respiratory support compared to home requirements. Unknown cause of exacerbation. WBC was elevated at presentation, but RPP and 4 plex qPCR negative collected last night make infectious cause less likely. CXR demonstrated bibasilar airspace disease. Current presentation most likely secondary to COPD exacerbation with some contribution of HFpEF. Continue to wean O2 as tolerated, but increase O2 flow rate or use BiPAP as needed for oxygenation. Continue LAMA/LABA/ICS, finish ceftriaxone today for CAP coverage and continue azithromycin for 5 day course. Added on procalcintonin to AM labs found to be 0.72. At prior admission was 0.13. This increase is consistent with bacterial lower respiratory infection. At her previous hospitalization she required ICU care on BiPAP, rhinovirus positve and MRSA nares negative. - Methylprednisolone 125 mg IV every 12 hours through this morning - Ipratropium 0.5 mg Q6H via neb - Revefenacin, arformoterol, budesonide via neb - BiPAP as needed for increased work of breathing - Ceftriaxone day 3 of 3 for CAP coverage - Azithromycin day 3 or 5 for COPD exacerbation - Guaifenesin 600 mg BID - Procalcitonin 0.72 on hospital day 2  Active Problems   Heart failure with preserved  ejection fraction (HCC)   Volume overload   Left arm swelling Potential mixed picture of heart failure on top of COPD exacerbation. ProBNP up to 805.7. Asymmetric upper extremity edema, left worse than right. US  shows no DVT in upper extremities. Decision to diurese each day dependant on physical exam and volume status. Today her L arm is much less swollen after encouraging her to elevate it. - Hold furosemide 40 mg IV, in AM    Anxiety Patient has concerns regarding  anxiety surrounding breathing. She had a panic attack this morning because of air hunger, but it resolved after being placed on BiPAP. She is very interested in having her anxiety managed, saying "use anything that can help". - Continue home citalopram 20 mg every day  - Continue PRN hydroxyzine 25 mg TID - Continue Buspirone 7.5 mg BID  Tachycardia Patient is persistently tachycardic in low 100s. Likely secondary to sustained albuterol use. - Discontinue albuterol, use ipratropium for SOB as above    Advance care planning Patient is currently full code. She has been counseled about making changes to this when she was admitted and took the recommendation to be DNR/DNI well. She was offered palliative consult for more thorough exploration of goals of care discussions. - Palliative care consult pending  Stable chronic problems   Protein calorie malnutrition (HCC) Seen by dietitians during her last admission recommended 3 proteins shakes every day. - Continue protein shake supplementation    Hypertension - Continue home amlodipine 10 mg   LOS: 2 days   Ferdinand Houseman, Medical Student 07/13/2023, 6:06 AM

## 2023-07-13 NOTE — Plan of Care (Signed)
  Problem: Education: Goal: Knowledge of General Education information will improve Description: Including pain rating scale, medication(s)/side effects and non-pharmacologic comfort measures 07/13/2023 1936 by Alix Isaac, RN Outcome: Progressing 07/13/2023 1936 by Alix Isaac, RN Outcome: Progressing   Problem: Clinical Measurements: Goal: Ability to maintain clinical measurements within normal limits will improve 07/13/2023 1936 by Alix Isaac, RN Outcome: Progressing 07/13/2023 1936 by Alix Isaac, RN Outcome: Progressing Goal: Will remain free from infection Outcome: Progressing Goal: Diagnostic test results will improve Outcome: Progressing

## 2023-07-13 NOTE — Plan of Care (Signed)

## 2023-07-14 DIAGNOSIS — D649 Anemia, unspecified: Secondary | ICD-10-CM | POA: Insufficient documentation

## 2023-07-14 DIAGNOSIS — Z515 Encounter for palliative care: Secondary | ICD-10-CM | POA: Diagnosis not present

## 2023-07-14 DIAGNOSIS — Z7189 Other specified counseling: Secondary | ICD-10-CM | POA: Diagnosis not present

## 2023-07-14 MED ORDER — MORPHINE SULFATE 10 MG/5ML PO SOLN
5.0000 mg | Freq: Once | ORAL | Status: AC
  Administered 2023-07-14: 5 mg via ORAL
  Filled 2023-07-14: qty 4

## 2023-07-14 NOTE — Progress Notes (Signed)
 BIPAP not indicated at this time. Machine on standby in room.

## 2023-07-14 NOTE — Plan of Care (Signed)
  Problem: Education: Goal: Knowledge of General Education information will improve Description: Including pain rating scale, medication(s)/side effects and non-pharmacologic comfort measures Outcome: Progressing Note: Better understanding of anxiety and effective ways to reduce.   Problem: Clinical Measurements: Goal: Ability to maintain clinical measurements within normal limits will improve Outcome: Progressing Goal: Will remain free from infection Outcome: Progressing Goal: Diagnostic test results will improve Outcome: Progressing

## 2023-07-14 NOTE — Progress Notes (Signed)
 Scheduled neb treatments given. Post txs pt endorsed increase in work of breathing and requested bipap be placed on. Pt placed on bipap at this time and tolerating well. RT will continue to monitor and be available as needed.    07/14/23 0845  Vent Select  Invasive or Noninvasive Noninvasive  Adult Vent Y  Adult Ventilator Settings  Vent Type Servo i  Vent Mode BIPAP;PCV  Set Rate 15 bmp  FiO2 (%) 50 %  I Time 0.7 Sec(s)  Pressure Control 7 cmH20 (above peep)  PEEP 8 cmH20  Adult Ventilator Measurements  Peak Airway Pressure 14 L/min  Mean Airway Pressure 11 cmH20  Resp Rate Spontaneous 18 br/min  Resp Rate Total 33 br/min  Exhaled Vt 535 mL  Spont TV 548 mL  Measured Ve 13.2 L  I:E Ratio Measured 1:1.4  Auto PEEP 0 cmH20  Total PEEP 5 cmH20  Adult Ventilator Alarms  Alarms On Y  Ve High Alarm 22 L/min  Ve Low Alarm 4 L/min  Resp Rate High Alarm 40 br/min  Resp Rate Low Alarm 8  PEEP Low Alarm 2 cmH2O  Press High Alarm 30 cmH2O  T Apnea 20 sec(s)  VAP Prevention  HOB> 30 Degrees Y  Breath Sounds  Bilateral Breath Sounds Clear;Diminished  Vent Respiratory Assessment  Patient Effort Good  Level of Consciousness Alert  Respiratory Pattern Regular;Unlabored;Symmetrical;Tachypnea

## 2023-07-14 NOTE — Progress Notes (Cosign Needed Addendum)
 Subjective:  Today, [name] feels good. She admits that she feels well "as long as I don't move". She is very concerned about her anxiety and her ability to progress while her anxiety is at the present level. She required her BiPAP again this morning for anxiety regarding air hunger. She endorses continued hemoptysis, now endorsing that the blood is more dark red, and not like coffee grounds.  Objective:  Vital signs in last 24 hours: Vitals:   07/13/23 2004 07/13/23 2325 07/14/23 0346 07/14/23 0457  BP:  (!) 127/55  (!) 149/54  Pulse:  (!) 106  (!) 104  Resp:  20 20 20   Temp:  97.6 F (36.4 C)  (!) 97.5 F (36.4 C)  TempSrc:  Oral  Oral  SpO2: 93% 94%  92%  Weight:      Height:       Weight change:   Intake/Output Summary (Last 24 hours) at 07/14/2023 4098 Last data filed at 07/13/2023 0856 Gross per 24 hour  Intake --  Output 350 ml  Net -350 ml   GEN: No acute distress, sitting comfortably in bed, eating her meal, [place] in place CV: distant heart sounds, irregular rhythm PULM: end expiratory wheezing in upper lung fields, air movement in bilateral lower lung fields with mild wheezing  Assessment/Plan:  Patient Summary [name] is a 88 y.o. with severe COPD, not requiring O2 at home, and history of HFpEF who presents 8 days after being discharged for a COPD exacerbation which required ICU care, who presented with dyspnea and wheezing was admitted for acute on chronic respiratory failure.  Principal Problem   Respiratory failure with hypoxia and hypercapnia (HCC)   COPD exacerbation (HCC) Patient continues to require additional O2 support, from her no O2 baseline. Was discharged on 2L O2 at the end of March 2025. Continue to wean O2 as tolerated, but increase O2 flow rate or use BiPAP as needed for oxygenation. Continue LAMA/LABA/ICS and continue azithromycin for 5 day course. At a visit to her pulmonologist in October 2024, he noted that she may ultimately  qualify for oxygen. She may require oxygen at discharge. - Ipratropium 0.5 mg Q6H via neb - Revefenacin, arformoterol, budesonide via neb - BiPAP as needed for increased work of breathing - Azithromycin day 4 of 5 for COPD exacerbation - Guaifenesin 600 mg BID - Procalcitonin 0.72 on hospital day 2  Active Problems   Anxiety Patient has concerns regarding anxiety surrounding breathing. She had another anxiety attack this morning and required BiPAP. Today will try one time morphine to see if this leads to improvement of anxiety. - Continue home citalopram 20 mg every day  - Continue PRN hydroxyzine 25 mg TID - Increased Buspirone to 10 mg BID - Added 5 mg morphine today to test to see if this helps with anxiety and air hunger    Anemia Patient's Hgb has decreased from 12.2 on admission to 10.6 over 3 days. Normocytic, with increased RDW. Differential includes blood loss from hemoptysis and GI bleed. Will evaluate for rectal bleeding. Ask nursing about stool color and consider DRE tomorrow. - CBC tomorrow AM - Follow up on iron panel    Heart failure with preserved ejection fraction (HCC)   Volume overload Potential mixed picture of heart failure on top of COPD exacerbation. ProBNP up to 805.7 on admission.  - Diurese for symptomatic relief as needed - No scheduled furosemide today    Left arm swelling Asymmetric upper extremity  edema, left worse than right. US  shows no DVT in upper extremities. Decision to diurese each day dependant on physical exam and volume status. Today her L arm is much less swollen after encouraging her to elevate it. - Monitor daily    Tachycardia Patient remains tachycardic in low 100s today after discontinuing albuterol. Differential of etiology includes albuterol, beta-2 agonists, anxiety, bacterial lung infection and pain. - Monitor tomorrow after morphine administration to evaluate cause.    Advance care planning Patient is currently full code. She has  been counseled about making changes to this when she was admitted and took the recommendation to be DNR/DNI well. She was offered palliative consult for more thorough exploration of goals of care discussions and better understanding of her goals. - Palliative care seeing patient regularly, appreciate recs  Stable chronic problems   Protein calorie malnutrition (HCC) Seen by dietitians during her last admission recommended 3 proteins shakes every day. - Continue protein shake supplementation   Hypertension - Continue home amlodipine 10 mg   LOS: 3 days   Ferdinand Houseman, Medical Student 07/14/2023, 6:28 AM

## 2023-07-14 NOTE — Progress Notes (Signed)
 Physical Therapy Treatment Patient Details Name: [name] MRN: [id number] DOB: 22-Aug-1935 Today's Date: 07/14/2023   History of Present Illness Pt is a 88 y.o. female admitted 07/11/23 for respiratory failure with hypoxia and hypercapnia. Pt presented with SOB and increased WOB. Workup was notable for mild acidosis, borderline high CO2 on her VBG.  PMH of COPD on 2-3L O2 at baseline, COVID Jan 2025, TAVR, PPM, GIB, endometrial CA, HTN, HLD, CAD, mild pulmonary hypertension. Of note, recent hospitalization for COPD exacerbation which required a stay in the ICU.    PT Comments  Pt resting in bed no arrival and agreeable to session with steady progress towards acute goals. Pt continues to limited by DOE, however pt able to maintain SpO2 88-95% on 5L throughout all mobility. Pt continues to benefit from cues for breathing techniques as pt with some anxiousness in anticipation of SpO2 dropping and DOE, with pt demonstrating increased accessory muscle use for inspiration. Pt requiring grossly CGA for bed mobility and light min A to steady on rise to stand with HHA. Pt able to maintain standing ~2 mins before needing to sit due to fatigue. Educated pt on importance of time up OOB and continued mobility with pt verbalizing understanding. Pt continues to benefit from skilled PT services to progress toward functional mobility goals.      If plan is discharge home, recommend the following: A little help with walking and/or transfers;A lot of help with bathing/dressing/bathroom;Assist for transportation;Help with stairs or ramp for entrance;Assistance with cooking/housework   Can travel by private vehicle     No  Equipment Recommendations  Wheelchair (measurements PT);Wheelchair cushion (measurements PT)    Recommendations for Other Services       Precautions / Restrictions Precautions Precautions: Fall Recall of Precautions/Restrictions: Intact Precaution/Restrictions Comments: Watch  SpO2 Restrictions Weight Bearing Restrictions Per Provider Order: No     Mobility  Bed Mobility Overal bed mobility: Needs Assistance Bed Mobility: Supine to Sit, Sit to Supine     Supine to sit: Supervision, HOB elevated Sit to supine: Supervision, HOB elevated   General bed mobility comments: Pt sat up on L side of bed with increased time and supervision for safety and line management.    Transfers Overall transfer level: Needs assistance Equipment used: 1 person hand held assist, None Transfers: Sit to/from Stand Sit to Stand: Contact guard assist           General transfer comment: CGA for safety with HHA to steady on rise with pt preferance for HHA in standing    Ambulation/Gait               General Gait Details: Deferred d/t fatigue   Stairs             Wheelchair Mobility     Tilt Bed    Modified Rankin (Stroke Patients Only)       Balance Overall balance assessment: Needs assistance Sitting-balance support: No upper extremity supported, Feet supported Sitting balance-Leahy Scale: Fair Sitting balance - Comments: Pt was able to don socks in long sitting. She sat EOB with CGA.   Standing balance support: Bilateral upper extremity supported, Single extremity supported, During functional activity Standing balance-Leahy Scale: Fair Standing balance comment: Pt stood with 1-2 HHA. She maintained static stance with unilateral UE support. No LOB.                            Communication Communication Communication: Impaired  Factors Affecting Communication: Hearing impaired  Cognition Arousal: Alert Behavior During Therapy: Anxious, WFL for tasks assessed/performed   PT - Cognitive impairments: Safety/Judgement, Awareness                       PT - Cognition Comments: She reports a hx of panic attacks and states she is getting worked up easier. She required increased time to perform all mobility, moderate  encouragement to participate, and re-direction to help calm her down. Following commands: Intact      Cueing Cueing Techniques: Verbal cues  Exercises General Exercises - Lower Extremity Straight Leg Raises: AROM, Right, Left, 5 reps, Supine    General Comments General comments (skin integrity, edema, etc.): SpO2 88-95% on 5L throughout session and with mobility, pt with milf DOE 2-3/4 during session, benefits from cues for PLB      Pertinent Vitals/Pain Pain Assessment Pain Assessment: No/denies pain    Home Living                          Prior Function            PT Goals (current goals can now be found in the care plan section) Acute Rehab PT Goals PT Goal Formulation: With patient/family Time For Goal Achievement: 07/26/23 Progress towards PT goals: Progressing toward goals    Frequency    Min 2X/week      PT Plan      Co-evaluation              AM-PAC PT "6 Clicks" Mobility   Outcome Measure  Help needed turning from your back to your side while in a flat bed without using bedrails?: A Little Help needed moving from lying on your back to sitting on the side of a flat bed without using bedrails?: A Little Help needed moving to and from a bed to a chair (including a wheelchair)?: A Little Help needed standing up from a chair using your arms (e.g., wheelchair or bedside chair)?: A Little Help needed to walk in hospital room?: Total Help needed climbing 3-5 steps with a railing? : Total 6 Click Score: 14    End of Session Equipment Utilized During Treatment: Oxygen Activity Tolerance: Patient limited by fatigue Patient left: in bed;with call bell/phone within reach;with family/visitor present Nurse Communication: Mobility status PT Visit Diagnosis: Other abnormalities of gait and mobility (R26.89);Muscle weakness (generalized) (M62.81)     Time: 4098-1191 PT Time Calculation (min) (ACUTE ONLY): 15 min  Charges:    $Therapeutic  Activity: 8-22 mins PT General Charges $$ ACUTE PT VISIT: 1 Visit                     Annalisa Colonna R. PTA Acute Rehabilitation Services Office: [phone]   Agapito Horseman 07/14/2023, 2:25 PM

## 2023-07-14 NOTE — Progress Notes (Signed)
 Palliative Medicine Inpatient Follow Up Note HPI: [name] is a 88 y.o. with severe COPD and history of HFpEF who presents with dyspnea and wheezing is admitted for acute on chronic hypoxic and hypercapnic respiratory failure.   The Palliative care team has been asked to get involved for further foals of care conversations.  Today's Discussion 07/14/2023  *Please note that this is a verbal dictation therefore any spelling or grammatical errors are due to the "[employer]agon [employer] One" system interpretation.  Chart reviewed inclusive of vital signs, progress notes, laboratory results, and diagnostic images.   I met with [name] at bedside this morning. She shares that she "is not ready to leave yet" as she has episodes where her oxygen goes down with just turning.   Discussed with [name] alternative ways to handle anxiety such as breathing exercises. She does feel some improvement with her anxiety but does not ongoing feelings of worry. Created space and opportunity for patient to explore thoughts feelings and fears regarding current medical situation.  Discussed the plan at this time in regards to her going to a rehabilitation to gain strength. I shared the primary medical team would determine when she is stable enough to do so and I shared that I would voice her concerns.  We reviewed the MOST for as below:  Cardiopulmonary Resuscitation: Do Not Attempt Resuscitation (DNR/No CPR)  Medical Interventions: Limited Additional Interventions: Use medical treatment, IV fluids and cardiac monitoring as indicated, DO NOT USE intubation or mechanical ventilation. May consider use of less invasive airway support such as BiPAP or CPAP. Also provide comfort measures. Transfer to the hospital if indicated. Avoid intensive care.   Antibiotics: Antibiotics if indicated  IV Fluids: IV fluids if indicated  Feeding Tube: No feeding tube   Questions and concerns addressed/Palliative Support Provided.    Objective Assessment: Vital Signs Vitals:   07/14/23 0814 07/14/23 0815  BP:    Pulse:    Resp:    Temp:    SpO2: 94% 92%    Intake/Output Summary (Last 24 hours) at 07/14/2023 1044 Last data filed at 07/14/2023 1610 Gross per 24 hour  Intake 450 ml  Output 450 ml  Net 0 ml   Last Weight  Most recent update: 07/14/2023  7:34 AM    Weight  50.7 kg (111 lb 12.4 oz)            Gen: Chronically ill-appearing elderly Caucasian female HEENT: moist mucous membranes CV: Regular rate and rhythm  PULM: On 5L breathing is even and unlabored ABD: soft/nontender  EXT: No edema  Neuro: Alert and oriented x3   SUMMARY OF RECOMMENDATIONS   DNAR/DNI   DNR/MOST form completed and placed on the paper Chart    Patient is open to OP Palliative support on discharge   Patients goal is to be functional without significant SOB  Patients anxiety is quite significant despite her citalopram/buspar/hydroxyzine as needed --> It may be worth considering having psychiatry weigh in on this as it appears quite inhibitory towards her improvement   Ongoing PMT support  Billing based on MDM: High in the setting of advance care planning document completion ______________________________________________________________________________________ Lamarr Lulas [place] Palliative Medicine Team Team Cell Phone: [phone] Please utilize secure chat with additional questions, if there is no response within 30 minutes please call the above phone number  Palliative Medicine Team providers are available by phone from 7am to 7pm daily and can be reached through the team cell phone.  Should  this patient require assistance outside of these hours, please call the patient's attending physician.

## 2023-07-15 DIAGNOSIS — Z515 Encounter for palliative care: Secondary | ICD-10-CM | POA: Diagnosis not present

## 2023-07-15 DIAGNOSIS — Z7189 Other specified counseling: Secondary | ICD-10-CM | POA: Diagnosis not present

## 2023-07-15 LAB — CBC
HCT: 34.4 % — ABNORMAL LOW (ref 36.0–46.0)
Hemoglobin: 11.5 g/dL — ABNORMAL LOW (ref 12.0–15.0)
MCH: 30.6 pg (ref 26.0–34.0)
MCHC: 33.4 g/dL (ref 30.0–36.0)
MCV: 91.5 fL (ref 80.0–100.0)
Platelets: 221 10*3/uL (ref 150–400)
RBC: 3.76 MIL/uL — ABNORMAL LOW (ref 3.87–5.11)
RDW: 15.5 % (ref 11.5–15.5)
WBC: 11.2 10*3/uL — ABNORMAL HIGH (ref 4.0–10.5)
nRBC: 0 % (ref 0.0–0.2)

## 2023-07-15 MED ORDER — MORPHINE SULFATE 10 MG/5ML PO SOLN: 5.0000 mg | Freq: Four times a day (QID) | ORAL | Status: DC

## 2023-07-15 MED ORDER — MORPHINE SULFATE 10 MG/5ML PO SOLN: 6.0000 mg | Freq: Four times a day (QID) | ORAL | Status: DC

## 2023-07-15 MED ORDER — FUROSEMIDE 20 MG PO TABS
20.0000 mg | ORAL_TABLET | Freq: Every day | ORAL | Status: DC
  Administered 2023-07-15 – 2023-07-20 (×6): 20 mg via ORAL
  Filled 2023-07-15 (×6): qty 1

## 2023-07-15 MED ORDER — MORPHINE NICU/PEDS ORAL SYRINGE 0.4 MG/ML: 6.0000 mg | Freq: Once | ORAL | Status: DC

## 2023-07-15 MED ORDER — MORPHINE SULFATE 10 MG/5ML PO SOLN
5.0000 mg | Freq: Four times a day (QID) | ORAL | Status: DC | PRN
  Administered 2023-07-16 – 2023-07-21 (×10): 5 mg via ORAL
  Filled 2023-07-15 (×5): qty 5
  Filled 2023-07-15: qty 4
  Filled 2023-07-15 (×4): qty 5

## 2023-07-15 MED ORDER — GUAIFENESIN ER 600 MG PO TB12
1200.0000 mg | ORAL_TABLET | Freq: Two times a day (BID) | ORAL | Status: DC
  Administered 2023-07-15 – 2023-07-27 (×24): 1200 mg via ORAL
  Filled 2023-07-15 (×24): qty 2

## 2023-07-15 MED ORDER — HYDROXYZINE HCL 25 MG PO TABS
25.0000 mg | ORAL_TABLET | Freq: Once | ORAL | Status: AC
  Administered 2023-07-15: 25 mg via ORAL
  Filled 2023-07-15 (×2): qty 1

## 2023-07-15 MED ORDER — MORPHINE SULFATE 10 MG/5ML PO SOLN
6.0000 mg | Freq: Once | ORAL | Status: DC
  Filled 2023-07-15: qty 4

## 2023-07-15 NOTE — Progress Notes (Signed)
 Patient and niece requesting that if possible would like to be moved to another room with more traffic. Patient states she feels isolation at end of hall is adding to her anxiety. Charge RN made aware of pt/family request. Pt resting with call bell within reach.  Will continue to monitor.

## 2023-07-15 NOTE — Progress Notes (Signed)
   07/15/23 2104  BiPAP/CPAP/SIPAP  $ Non-Invasive Ventilator  Non-Invasive Vent Subsequent  BiPAP/CPAP/SIPAP Pt Type Adult  BiPAP/CPAP/SIPAP SERVO  Reason BIPAP/CPAP not in use Other(comment) (no respiratory distress noted, BIPAP not needed atm. Pt will let RT know if she requests it.)  BiPAP/CPAP /SiPAP Vitals  Pulse Rate (!) 105  Resp 18  SpO2 95 %  Bilateral Breath Sounds Expiratory wheezes

## 2023-07-15 NOTE — Plan of Care (Signed)
   Problem: Education: Goal: Knowledge of General Education information will improve Description: Including pain rating scale, medication(s)/side effects and non-pharmacologic comfort measures Outcome: Progressing   Problem: Activity: Goal: Risk for activity intolerance will decrease Outcome: Progressing

## 2023-07-15 NOTE — Progress Notes (Signed)
 Occupational Therapy Treatment Patient Details Name: [name] MRN: [id number] DOB: 12/23/1935 Today's Date: 07/15/2023   History of present illness Pt is a 88 y.o. female admitted 07/11/23 for respiratory failure with hypoxia and hypercapnia. Pt presented with SOB and increased WOB. Workup was notable for mild acidosis, borderline high CO2 on her VBG.  PMH of COPD on 2-3L O2 at baseline, COVID Jan 2025, TAVR, PPM, GIB, endometrial CA, HTN, HLD, CAD, mild pulmonary hypertension. Of note, recent hospitalization for COPD exacerbation which required a stay in the ICU.   OT comments  OT session focused on education in techniques for improved management of symptoms of anxiety and day-to-day stress increased ability to participate in functional tasks, including education in belly breathing, guided meditation, body scan meditation, use of stress ball, and 5-4-3-2-1 sensory-based meditation. Pt verbalized and demonstrated understanding through teach back with pt reporting strategies are helpful. Handout also provided (MedBridge access code [id number]). Pt VSS on 4L continuous O2 through nasal cannula throughout session. Pt is making progress toward goals and will benefit from continued acute skilled OT services to address deficits outlined below and increase safety and independence with functional tasks. Post acute discharge, pt will benefit from intensive inpatient skilled rehab services , 3 hours per day to maximize rehab potential.       If plan is discharge home, recommend the following:  A little help with walking and/or transfers;A little help with bathing/dressing/bathroom;Assistance with cooking/housework;Help with stairs or ramp for entrance;Assist for transportation   Equipment Recommendations  None recommended by OT (Pt already has needed equipment)    Recommendations for Other Services      Precautions / Restrictions Precautions Precautions: Fall Recall of Precautions/Restrictions:  Intact Precaution/Restrictions Comments: Watch SpO2 Restrictions Weight Bearing Restrictions Per Provider Order: No       Mobility Bed Mobility   Bed Mobility: Rolling, Supine to Sit Rolling: Modified independent (Device/Increase time)   Supine to sit: Modified independent (Device/Increase time), Supervision (coming to long sitting in bed)     General bed mobility comments: Pt declined further bed mobility and OOB activity this session with no reason given and with pt stating she'll "try later" with assist of Mobility Specialist.    Transfers                         Balance Overall balance assessment: Needs assistance Sitting-balance support: Single extremity supported, No upper extremity supported, Feet supported Sitting balance-Leahy Scale: Fair Sitting balance - Comments: in long sitting in bed                                   ADL either performed or assessed with clinical judgement   ADL Overall ADL's : Needs assistance/impaired                                       General ADL Comments: Pt with increased anxiety with functional mobility and OOB activity, including transitioning to sit EOB or transfer to recliner for ADLs. OT educated pt and her niece in techniques for improved management of pt's feelings of anxiety and general stress, including belly breathing, guided mediation, body scan meditation, use of stress ball, and 5-4-3-2-1 sensory-based meditation. OT also educated pt and niece in importance of practicing strategies when pt is calm to improve  pt ability to successfully utilize strategies during moments of anxiety or stress. Pt and niece verbalized and demonstrated understanding through teach back of all education. Pt reports she feels these strategies will be helpful. Handout provided (MedBridge access code [id number]).    Extremity/Trunk Assessment Upper Extremity Assessment Upper Extremity Assessment: Right hand  dominant;Generalized weakness   Lower Extremity Assessment Lower Extremity Assessment: Defer to PT evaluation        Vision       Perception     Praxis     Communication Communication Communication: Impaired Factors Affecting Communication: Hearing impaired   Cognition Arousal: Alert Behavior During Therapy: Anxious, WFL for tasks assessed/performed Cognition: No apparent impairments             OT - Cognition Comments: Pt AAOx4 and pleasant throughout session. Pt reports ongoing issues with anxiety affecting functional level.                 Following commands: Intact        Cueing   Cueing Techniques: Verbal cues  Exercises      Shoulder Instructions       General Comments VSS on 4L continuous O2 through nasal cannula throughout session. Pt's neice present through majority of session.    Pertinent Vitals/ Pain       Pain Assessment Pain Assessment: No/denies pain  Home Living                                          Prior Functioning/Environment              Frequency  Min 2X/week        Progress Toward Goals  OT Goals(current goals can now be found in the care plan section)  Progress towards OT goals: Progressing toward goals  Acute Rehab OT Goals Patient Stated Goal: to breathe easier  Plan      Co-evaluation                 AM-PAC OT "6 Clicks" Daily Activity     Outcome Measure   Help from another person eating meals?: None Help from another person taking care of personal grooming?: A Little Help from another person toileting, which includes using toliet, bedpan, or urinal?: A Little Help from another person bathing (including washing, rinsing, drying)?: A Little Help from another person to put on and taking off regular upper body clothing?: A Little Help from another person to put on and taking off regular lower body clothing?: A Little 6 Click Score: 19    End of Session Equipment Utilized  During Treatment: Oxygen  OT Visit Diagnosis: Muscle weakness (generalized) (M62.81);Other (comment);Other symptoms and signs involving cognitive function (decreased activity tolerance)   Activity Tolerance Patient tolerated treatment well;Other (comment) (Pt with some self-limiting behavior, declining sitting EOB or OOB activity this session.)   Patient Left in bed;with call bell/phone within reach;with family/visitor present   Nurse Communication Mobility status        Time: 1116-1150 OT Time Calculation (min): 34 min  Charges: OT General Charges $OT Visit: 1 Visit OT Treatments $Therapeutic Activity: 23-37 mins  Mekaylah Klich "Orson Eva., OTR/L, MA Acute Rehab [phone]   Lendon Colonel 07/15/2023, 4:41 PM

## 2023-07-15 NOTE — Progress Notes (Signed)
   Palliative Medicine Inpatient Follow Up Note HPI: [name] is a 88 y.o. with severe COPD and history of HFpEF who presents with dyspnea and wheezing is admitted for acute on chronic hypoxic and hypercapnic respiratory failure.   The Palliative care team has been asked to get involved for further foals of care conversations.  Today's Discussion 07/15/2023  *Please note that this is a verbal dictation therefore any spelling or grammatical errors are due to the "Dragon Medical One" system interpretation.  Chart reviewed inclusive of vital signs, progress notes, laboratory results, and diagnostic images.   I spoke to [facility] this morning. She is in good spirits and shares that the night nurse helped her to get some sputum up which overall has helped her breathing. She notes ongoing anxiety but some improvement.   Discussed the goal of patient getting OOB to chair today.   Reviewed that [name] still does not feel ready to transition to skilled nursing. She felt that previously she was discharged "too soon" and she does not desire for this to happen again. Allowed her time and space to express her apprehensions.   Questions and concerns addressed/Palliative Support Provided.   Objective Assessment: Vital Signs Vitals:   07/15/23 0848 07/15/23 0906  BP:    Pulse: 99   Resp:    Temp:    SpO2: 92% 94%    Intake/Output Summary (Last 24 hours) at 07/15/2023 1006 Last data filed at 07/14/2023 2300 Gross per 24 hour  Intake --  Output 500 ml  Net -500 ml   Last Weight  Most recent update: 07/14/2023  7:34 AM    Weight  50.7 kg (111 lb 12.4 oz)            Gen: Chronically ill-appearing elderly Caucasian female HEENT: moist mucous membranes CV: Regular rate and rhythm  PULM: On 4L breathing is even and unlabored ABD: soft/nontender  EXT: No edema  Neuro: Alert and oriented x3   SUMMARY OF RECOMMENDATIONS   DNAR/DNI   DNR/MOST form completed and placed on the paper  Chart    Patient is open to OP Palliative support on discharge   Patients goal is to be functional without significant SOB --> Agree with morphine for air hunger   Ongoing PMT support  Time: 25 ______________________________________________________________________________________ [facility] [place] Palliative Medicine Team Team Cell Phone: [phone] Please utilize secure chat with additional questions, if there is no response within 30 minutes please call the above phone number  Palliative Medicine Team providers are available by phone from 7am to 7pm daily and can be reached through the team cell phone.  Should this patient require assistance outside of these hours, please call the patient's attending physician.

## 2023-07-15 NOTE — TOC Initial Note (Signed)
 Transition of Care [facility]) - Initial/Assessment Note    Patient Details  Name: [name] MRN: [id number] Date of Birth: 1935/11/29  [facility] CM/SW Contact:    Valery Gaucher, LCSW Phone Number: 07/15/2023, 11:39 AM  Clinical Narrative:                  CSW spoke with patient's niece, Edwina Gram, that assist with patient care and decision making. She confirmed the patient arrived from [facility] and is expected to return once stable.   TOC will continue to follow and assist with discharge planning.  Patient will need new authorization nonce stable before she returns to SNF.   Liddie Reel, MSW, LCSW Clinical Social Worker    Expected Discharge Plan: Skilled Nursing Facility Barriers to Discharge: Continued Medical Work up   Patient Goals and CMS Choice            Expected Discharge Plan and Services In-house Referral: Clinical Social Work     Living arrangements for the past 2 months: Single Family Home                                      Prior Living Arrangements/Services Living arrangements for the past 2 months: Single Family Home Lives with:: Self          Need for Family Participation in Patient Care: Yes (Comment) Care giver support system in place?: Yes (comment)   Criminal Activity/Legal Involvement Pertinent to Current Situation/Hospitalization: No - Comment as needed  Activities of Daily Living      Permission Sought/Granted                  Emotional Assessment       Orientation: : Oriented to Self, Oriented to Place, Oriented to  Time, Oriented to Situation Alcohol / Substance Use: Not Applicable Psych Involvement: No (comment)  Admission diagnosis:  COPD exacerbation (HCC) [J44.1] HCAP (healthcare-associated pneumonia) [J18.9] Acute respiratory failure, unspecified whether with hypoxia or hypercapnia (HCC) [J96.00] Respiratory failure with hypoxia and hypercapnia (HCC) [J96.91, J96.92] Patient Active  Problem List   Diagnosis Date Noted   Anemia 07/14/2023   Heart failure with preserved ejection fraction (HCC) 07/12/2023   Respiratory failure with hypoxia and hypercapnia (HCC) 07/11/2023   Volume overload 07/11/2023   Left arm swelling 07/11/2023   Anxiety 07/11/2023   Advance care planning 07/11/2023   Acute bronchitis due to Rhinovirus 06/29/2023   Protein calorie malnutrition (HCC) 06/24/2023   COPD exacerbation (HCC) 06/23/2023   Community acquired pneumonia of right lower lobe of lung 06/23/2023   Protein-calorie malnutrition, mild (HCC) 06/23/2023   Acute on chronic respiratory failure with hypoxia (HCC) 06/23/2023   Full incontinence of feces 01/22/2023   Sacroiliac joint dysfunction 01/22/2023   Hypoxemia 12/31/2022   Frequency of urination 08/14/2022   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   NSVT (nonsustained ventricular tachycardia) (HCC) 01/06/2022   Conductive hearing loss, bilateral 09/12/2021   Generalized anxiety disorder 08/11/2021   Tobacco use 10/18/2020   COPD (chronic obstructive pulmonary disease) (HCC) 10/18/2020   Pacemaker-MDT 09/13/2020   Generalized weakness 07/04/2020   Dark stools 07/04/2020   DOE (dyspnea on exertion) 07/04/2020   Duodenal ulcer due to Helicobacter pylori 06/19/2020   Acute blood loss anemia 06/18/2020   HTN (hypertension) 06/18/2020   HLD (hyperlipidemia) 06/18/2020   Carotid arterial disease (HCC) 06/18/2020   S/P TAVR (transcatheter aortic valve  replacement) 12/21/2018   Severe aortic valve stenosis    Complete heart block (HCC) 10/20/2018   Endometrial cancer (HCC) 06/12/2011   PCP:  Laneta Pintos, MD Pharmacy:   [facility] [address] [place] - [address] [place] [address] Phone: [phone] Fax: [phone]     Social Drivers of Health (SDOH) Social History: SDOH Screenings   Food Insecurity: No Food Insecurity (07/13/2023)  Housing: Low Risk  (07/13/2023)   Transportation Needs: No Transportation Needs (07/13/2023)  Utilities: Not At Risk (07/13/2023)  Alcohol Screen: Low Risk  (08/05/2022)  Depression (PHQ2-9): Low Risk  (01/22/2023)  Financial Resource Strain: Low Risk  (08/05/2022)  Physical Activity: Inactive (08/05/2022)  Social Connections: Socially Isolated (06/23/2023)  Stress: No Stress Concern Present (08/05/2022)  Tobacco Use: High Risk (06/23/2023)   SDOH Interventions:     Readmission Risk Interventions     No data to display

## 2023-07-15 NOTE — Progress Notes (Signed)
 Subjective:  [name] feels okay. She had another panic attack late last light and required BiPAP. She was anxious minutes before the rounding team entered, and received a dose of hydroxyzine. She is most worried that she is unable to cough up stubborn sputum and will cough very hard to get it up. She feels breathing relief after coughing up sputum.  Objective:  Vital signs in last 24 hours: Vitals:   07/14/23 2003 07/14/23 2108 07/15/23 0000 07/15/23 0400  BP:  (!) 131/47 (!) 113/46 (!) 139/56  Pulse:  69 99 98  Resp:  20 20 20   Temp:  98 F (36.7 C) 97.8 F (36.6 C) (!) 97.5 F (36.4 C)  TempSrc:  Oral Oral Oral  SpO2: 93% 93% 97% 98%  Weight:      Height:       Weight change:   Intake/Output Summary (Last 24 hours) at 07/15/2023 9562 Last data filed at 07/14/2023 2300 Gross per 24 hour  Intake --  Output 1150 ml  Net -1150 ml   GEN: No acute distress, sitting in bed comfortably with nasal cannula in place, LUE edema reduced, but still edematous compared to RUE CV: Distant heart sounds, irregular PULM: Intermittent hacking cough productive of sputum, air movement heard throughout lung fields, end expiratory wheezing in upper lobes, R>L, crackles in RLL NEURO: Alert oriented PSYCH: Calm, endorses anxiety attacks  Assessment/Plan:  Patient Summary [name] is a 88 y.o. with severe COPD, not requiring O2 at home, and history of HFpEF who presents 8 days after being discharged for a COPD exacerbation which required ICU care, who presented with dyspnea and wheezing was admitted for acute on chronic respiratory failure.  Principal Problem   Respiratory failure with hypoxia and hypercapnia (HCC)   COPD exacerbation (HCC) Patient continues to require additional O2 support, from her no O2 baseline. Was discharged on 2L O2 at the end of March 2025 to SNF. At a visit to her pulmonologist in October 2024, he noted that she may ultimately qualify for oxygen. She may  require oxygen at discharge. - Ipratropium 0.5 mg Q6H via neb - Revefenacin, arformoterol, budesonide via neb - BiPAP as needed for increased work of breathing - Azithromycin day 5 of 5 for COPD exacerbation - Guaifenesin 600 mg BID - Procalcitonin 0.72 on hospital day 2  Active Problems   Anxiety Patient has concerns regarding anxiety surrounding breathing. She also has concerns about her room being at the end of the hall, and thus has less foot traffic nearby. She didn't think her morphine was helpful yesterday, but would like to try it again at night so her anxiety regarding breathing my decrease and help her sleep. - Continue home citalopram 20 mg every day  - Continue PRN hydroxyzine 25 mg TID - Increased Buspirone to 10 mg BID - Added 6 mg morphine 4/16 one time dose at 2200 to help with air hunger    Anemia Patient's Hgb has decreased from 12.2 on admission to 10.6 over 3 days. Normocytic, with increased RDW. Today Hgb 11.5, MCV 91.5 and RDW down to 15.5. Differential includes blood loss from hemoptysis and GI bleed. Currently appears stable. Will check CBC tomorrow AM to see if Hgb is stable. - Follow up on iron panel    Heart failure with preserved ejection fraction (HCC)   Volume overload Potential mixed picture of heart failure on top of COPD exacerbation. ProBNP up to 805.7 on admission. Crackles auscultated in RLL,  will start to diurese with furosemide every day. - Diurese for symptomatic relief as needed - PO furosemide 20 mg every day    Left arm swelling Asymmetric upper extremity edema, left worse than right. US  shows no DVT in upper extremities. - Monitor daily    Tachycardia Patient's tachycardia slightly reduced to 90s from 100s today after discontinuing albuterol. Differential of etiology includes albuterol, beta-2 agonists, anxiety, bacterial lung infection and pain. - Monitor tomorrow after morphine administration to evaluate cause.    Advance care  planning Patient is DNR/DNI, status was updated on hospital day 2. - Palliative care seeing patient regularly, appreciate recs  Stable chronic problems   Protein calorie malnutrition (HCC) Seen by dietitians during her last admission recommended 3 proteins shakes every day. - Continue protein shake supplementation   Hypertension - Continue home amlodipine 10 mg   LOS: 4 days   Truxton Stupka P, Medical Student 07/15/2023, 6:14 AM

## 2023-07-16 DIAGNOSIS — Z515 Encounter for palliative care: Secondary | ICD-10-CM | POA: Diagnosis not present

## 2023-07-16 DIAGNOSIS — Z7189 Other specified counseling: Secondary | ICD-10-CM | POA: Diagnosis not present

## 2023-07-16 LAB — CULTURE, BLOOD (ROUTINE X 2)
Culture: NO GROWTH
Culture: NO GROWTH
Special Requests: ADEQUATE
Special Requests: ADEQUATE

## 2023-07-16 MED ORDER — BUSPIRONE HCL 5 MG PO TABS
15.0000 mg | ORAL_TABLET | Freq: Two times a day (BID) | ORAL | Status: DC
  Administered 2023-07-16 – 2023-07-19 (×7): 15 mg via ORAL
  Filled 2023-07-16 (×7): qty 3

## 2023-07-16 NOTE — Care Management Important Message (Signed)
 Important Message  Patient Details  Name: [name] MRN: [id number] Date of Birth: [birth year]   Important Message Given:  Yes - Medicare IM     Janith Melnick 07/16/2023, 11:16 AM

## 2023-07-16 NOTE — Progress Notes (Signed)
 Subjective:  [name] is feeling much better today. She thinks she is able to move her sputum much better. Last night, she felt that she slept well, but woke up around 3 AM. She started to panic about not being able to clear her mucus, but she was able to overcome the sensation. She thinks that she is getting better, but she is worried she is not well enough to go back to [facility] Around noon, she experienced pain in her back near T5 and fullness in her epigastric and LUQ when using her incentive spirometer that resolved within 10 minutes. IMTS team came by and checked in on her during the episode and determined the pain to not be cardiac in nature.  Objective:  Vital signs in last 24 hours: Vitals:   07/16/23 0801 07/16/23 0814 07/16/23 0900 07/16/23 1203  BP: (!) 142/41   (!) 156/47  Pulse: 88   80  Resp:    18  Temp:   (!) 97.5 F (36.4 C) (!) 97.5 F (36.4 C)  TempSrc:   Oral Oral  SpO2: 96% 97% 94%   Weight:      Height:       Weight change:   Intake/Output Summary (Last 24 hours) at 07/16/2023 1430 Last data filed at 07/16/2023 0215 Gross per 24 hour  Intake 240 ml  Output 900 ml  Net -660 ml   GEN: No acute distress, laying in bed with nasal cannula in place CV: Regular rate PULM: End expiratory wheezes in RML, and RLL. Mildly decreased lung sounds in the bases with good air movement throughout ABD: Non tender to palpation EXT: No edema in BLE, tense, dependent, focal edema L medial antecubital region PSYCH: Calm affect, until discharging raised as an option  Assessment/Plan:  Patient Summary [name] is a 88 y.o. with severe COPD, not requiring O2 at home, and history of HFpEF who presents 8 days after being discharged for a COPD exacerbation which required ICU care, who presented with dyspnea and wheezing was admitted for acute on chronic respiratory failure.  Principal Problem   Anxiety The patient's primary source of anxiety is surrounding  the inability to clear her mucus. When she cannot clear her mucus, she becomes very anxious about not being able to breathe. Discontinue hydroxyzine as this is highly anticholinergic and will dehydrate her secretions, and increase guaifenesin - Continue home citalopram 20 mg every day  - Buspirone 15 mg BID - Guaifenesin 1200 mg BID - Discontinue hydroxyzine - Added 5 mg morphine at 2200 to help with nighttime air hunger, and PRN Q4H for air hunger in the day  Active Problems   Respiratory failure with hypoxia and hypercapnia (HCC)   COPD exacerbation (HCC) O2 requirement decreasing today, with good saturation on 2L O2 today. Was discharged on 2L O2 at the end of March 2025 to SNF. At a visit to her pulmonologist in October 2024, he noted that she may ultimately qualify for oxygen. She may require oxygen at discharge. - Ipratropium 0.5 mg Q6H via neb - Revefenacin, arformoterol, budesonide via neb - BiPAP as needed for increased work of breathing    Anemia Patient's Hgb has decreased from 12.2 on admission to 10.6 over 3 days, then increased to 11.5. Daily CBCs not indicated as her Hgb is stable and she is not coughing up bright red blood. - No longer decreasing, discontinue daily CBCs    Heart failure with preserved ejection fraction (HCC)  Volume overload Potential mixed picture of heart failure on top of COPD exacerbation at presentation. Volume status has improved greatly, and she responded well to 20 mg furosemide yesterday. Previously discharged without furosemide. Continue furosemide for now and consider furosemide PRN for edema on discharge. - PO furosemide 20 mg every day    Left arm swelling Asymmetric upper extremity edema, left worse than right. US  shows no DVT in upper extremities. - Monitor daily - Elevate LUE orders    Tachycardia Patient's HR was 88 in the morning today. Differential of etiology includes albuterol, beta-2 agonists, anxiety, bacterial lung infection and  pain. Appears to be improving with change from albuterol to ipratropium. - Continue to monitor for tachycardia    Advance care planning Patient is DNR/DNI, status was updated on hospital day 2. - Palliative care seeing patient regularly, appreciate recs  Stable chronic problems   Protein calorie malnutrition (HCC) Seen by dietitians during her last admission recommended 3 proteins shakes every day. - Continue protein shake supplementation   Hypertension - Continue home amlodipine 10 mg  Diet: Regular VTE: enoxaparin 40 mg every day Code: DNR/DNI   LOS: 5 days   Ferdinand Houseman, Medical Student 07/16/2023, 2:30 PM

## 2023-07-16 NOTE — Progress Notes (Signed)
   07/16/23 2024  BiPAP/CPAP/SIPAP  Reason BIPAP/CPAP not in use Non-compliant   Pt refused BiPAP for night time use. Currently tolerating HFNC on 3L at this time VSS.

## 2023-07-16 NOTE — Progress Notes (Signed)
 Pt was given morphine 5mg  po which was tubed from pharmacy. Five mg was wasted with Joyce. I was unable to waste med in pixis and Epic.

## 2023-07-16 NOTE — Progress Notes (Signed)
 Physical Therapy Treatment Patient Details Name: [name] MRN: [id number] DOB: 1935/07/05 Today's Date: 07/16/2023   History of Present Illness Pt is a 88 y.o. female admitted 07/11/23 for respiratory failure with hypoxia and hypercapnia. Pt presented with SOB and increased WOB. Workup was notable for mild acidosis, borderline high CO2 on her VBG.  PMH of COPD on 2-3L O2 at baseline, COVID Jan 2025, TAVR, PPM, GIB, endometrial CA, HTN, HLD, CAD, mild pulmonary hypertension. Of note, recent hospitalization for COPD exacerbation which required a stay in the ICU.    PT Comments  Patient with bright affect and willing to work with PT despite fatigue. Patient demonstrated AROM LE exercises she has been doing on her own. Patient agreed to OOB to chair and required min assist with 1 person HHA. Felt too fatigued to attempt ambulation, but agreed to standing marching with RW. Up in chair with all needs met on departure.     If plan is discharge home, recommend the following: A little help with walking and/or transfers;A lot of help with bathing/dressing/bathroom;Assist for transportation;Help with stairs or ramp for entrance;Assistance with cooking/housework   Can travel by private vehicle     No  Equipment Recommendations  Wheelchair (measurements PT);Wheelchair cushion (measurements PT)    Recommendations for Other Services       Precautions / Restrictions Precautions Precautions: Fall Recall of Precautions/Restrictions: Intact Precaution/Restrictions Comments: Watch SpO2 Restrictions Weight Bearing Restrictions Per Provider Order: No     Mobility  Bed Mobility Overal bed mobility: Needs Assistance Bed Mobility: Supine to Sit     Supine to sit: Modified independent (Device/Increase time), HOB elevated          Transfers Overall transfer level: Needs assistance Equipment used: 1 person hand held assist, None Transfers: Sit to/from Stand, Bed to chair/wheelchair/BSC Sit to  Stand: Contact guard assist   Step pivot transfers: Min assist       General transfer comment: CGA for safety with HHA to steady on rise with pt preferance for HHA in standing    Ambulation/Gait Ambulation/Gait assistance: Contact guard assist   Assistive device: Rolling walker (2 wheels)       Pre-gait activities: marching in place x 20 reps (pt did not feel she could walk away from chair yet)     Stairs             Wheelchair Mobility     Tilt Bed    Modified Rankin (Stroke Patients Only)       Balance Overall balance assessment: Needs assistance Sitting-balance support: No upper extremity supported, Feet supported Sitting balance-Leahy Scale: Fair     Standing balance support: Bilateral upper extremity supported, Single extremity supported, During functional activity Standing balance-Leahy Scale: Poor Standing balance comment: UE support required                            Communication Communication Communication: Impaired Factors Affecting Communication: Hearing impaired  Cognition Arousal: Alert Behavior During Therapy: WFL for tasks assessed/performed   PT - Cognitive impairments: No apparent impairments                         Following commands: Intact      Cueing Cueing Techniques: Verbal cues  Exercises General Exercises - Lower Extremity Ankle Circles/Pumps: AROM, Both, 5 reps Straight Leg Raises: AROM, Right, Left, 5 reps, Supine    General Comments General comments (skin integrity,  edema, etc.): sats 97% down to 93% during transfer with quick recovery with pt independently using PLB. Pt on 3L O2      Pertinent Vitals/Pain Pain Assessment Pain Assessment: No/denies pain    Home Living                          Prior Function            PT Goals (current goals can now be found in the care plan section) Acute Rehab PT Goals Patient Stated Goal: Return Home Time For Goal Achievement:  07/26/23 Potential to Achieve Goals: Fair Progress towards PT goals: Progressing toward goals    Frequency    Min 2X/week      PT Plan      Co-evaluation              AM-PAC PT "6 Clicks" Mobility   Outcome Measure  Help needed turning from your back to your side while in a flat bed without using bedrails?: A Little Help needed moving from lying on your back to sitting on the side of a flat bed without using bedrails?: A Little Help needed moving to and from a bed to a chair (including a wheelchair)?: A Little Help needed standing up from a chair using your arms (e.g., wheelchair or bedside chair)?: A Little Help needed to walk in hospital room?: Total Help needed climbing 3-5 steps with a railing? : Total 6 Click Score: 14    End of Session Equipment Utilized During Treatment: Oxygen Activity Tolerance: Patient limited by fatigue Patient left: with call bell/phone within reach;with family/visitor present;in chair Nurse Communication: Mobility status PT Visit Diagnosis: Other abnormalities of gait and mobility (R26.89);Muscle weakness (generalized) (M62.81)     Time: 1610-9604 PT Time Calculation (min) (ACUTE ONLY): 24 min  Charges:    $Gait Training: 8-22 mins $Therapeutic Exercise: 8-22 mins PT General Charges $$ ACUTE PT VISIT: 1 Visit                      [name], PT Acute Rehabilitation Services  Office [phone]    Guilford Leep 07/16/2023, 4:44 PM

## 2023-07-16 NOTE — Progress Notes (Signed)
 Patient pressed call bell and advised front desk that she is having chest pain. Upon entering the room, the patient is on her phone. She explains that all of a sudden she felt tight squeeze pressure mid sternal radiating to her back, 5/10 intensity. Patient able to speak in complete sentences. She is nonmonitored, but 02 saturation is 96%, she is on 3L [place]. HR 80. Vitals obtained and stable. EKG obtained. Patient said that her pain got better after decreasing HOB 20 to obtain EKG. Patient requested to stay supine following EKG. Dr. Koomson IM Resident paged.

## 2023-07-16 NOTE — Plan of Care (Signed)

## 2023-07-17 DIAGNOSIS — J9622 Acute and chronic respiratory failure with hypercapnia: Secondary | ICD-10-CM | POA: Diagnosis not present

## 2023-07-17 DIAGNOSIS — J9621 Acute and chronic respiratory failure with hypoxia: Secondary | ICD-10-CM | POA: Diagnosis not present

## 2023-07-17 MED ORDER — MELATONIN 3 MG PO TABS
3.0000 mg | ORAL_TABLET | Freq: Every day | ORAL | Status: DC
  Administered 2023-07-17 – 2023-07-26 (×11): 3 mg via ORAL
  Filled 2023-07-17 (×11): qty 1

## 2023-07-17 MED ORDER — TRAZODONE HCL 50 MG PO TABS
25.0000 mg | ORAL_TABLET | Freq: Every day | ORAL | Status: DC
  Administered 2023-07-17 – 2023-07-26 (×10): 25 mg via ORAL
  Filled 2023-07-17 (×10): qty 1

## 2023-07-17 MED ORDER — BUDESON-GLYCOPYRROL-FORMOTEROL 160-9-4.8 MCG/ACT IN AERO
2.0000 | INHALATION_SPRAY | Freq: Two times a day (BID) | RESPIRATORY_TRACT | Status: DC
  Administered 2023-07-17 – 2023-07-26 (×19): 2 via RESPIRATORY_TRACT
  Filled 2023-07-17: qty 5.9

## 2023-07-17 NOTE — Progress Notes (Signed)
 Physical Therapy Treatment Patient Details Name: [name] MRN: [id number] DOB: 10-Jun-1935 Today's Date: 07/17/2023   History of Present Illness Pt is a 88 y.o. female admitted 07/11/23 for respiratory failure with hypoxia and hypercapnia. Pt presented with SOB and increased WOB. Workup was notable for mild acidosis, borderline high CO2 on her VBG.  PMH of COPD on 2-3L O2 at baseline, COVID Jan 2025, TAVR, PPM, GIB, endometrial CA, HTN, HLD, CAD, mild pulmonary hypertension. Of note, recent hospitalization for COPD exacerbation which required a stay in the ICU.    PT Comments  Pt up in chair, cheerful and eager for therapy session with pt demonstrating continued progress towards acute goals. Pt able to progress gait this session for x2 short bouts with grossly CGA for safety and RW for support. Pt able to perform seated therex for increased endurance and verbalizing understanding of importance of completing throughout day between therapies. Pt continues to be limited by decreased cardiopulmonary endurance, decreased activity tolerance, and poor balance/postural reactions. Patient will benefit from continued inpatient follow up therapy, <3 hours/day, will continue to follow acutely.     If plan is discharge home, recommend the following: A little help with walking and/or transfers;A lot of help with bathing/dressing/bathroom;Assist for transportation;Help with stairs or ramp for entrance;Assistance with cooking/housework   Can travel by private vehicle     No  Equipment Recommendations  Wheelchair (measurements PT);Wheelchair cushion (measurements PT)    Recommendations for Other Services       Precautions / Restrictions Precautions Precautions: Fall Recall of Precautions/Restrictions: Intact Precaution/Restrictions Comments: Watch SpO2 Restrictions Weight Bearing Restrictions Per Provider Order: No     Mobility  Bed Mobility Overal bed mobility: Needs Assistance              General bed mobility comments: up in chair on arrival    Transfers Overall transfer level: Needs assistance Equipment used: Rolling walker (2 wheels) Transfers: Sit to/from Stand, Bed to chair/wheelchair/BSC Sit to Stand: Contact guard assist           General transfer comment: CGA for safety    Ambulation/Gait Ambulation/Gait assistance: Contact guard assist Gait Distance (Feet): 10 Feet (+ 40') Assistive device: Rolling walker (2 wheels) Gait Pattern/deviations: Step-through pattern, Decreased stride length Gait velocity: reduced     General Gait Details: low short steps with RW for support, no overt LOB noted, CGA for safety, cues for breathing techniques throughout   Stairs             Wheelchair Mobility     Tilt Bed    Modified Rankin (Stroke Patients Only)       Balance Overall balance assessment: Needs assistance Sitting-balance support: No upper extremity supported, Feet supported Sitting balance-Leahy Scale: Fair     Standing balance support: Bilateral upper extremity supported, Single extremity supported, During functional activity Standing balance-Leahy Scale: Poor Standing balance comment: UE support required                            Communication Communication Communication: Impaired Factors Affecting Communication: Hearing impaired  Cognition Arousal: Alert Behavior During Therapy: WFL for tasks assessed/performed   PT - Cognitive impairments: No apparent impairments                         Following commands: Intact      Cueing Cueing Techniques: Verbal cues  Exercises Other Exercises Other Exercises: seated marching  x30 seconds, air punches in sitting x30 seconds,  x2 bouts for increased endurance    General Comments General comments (skin integrity, edema, etc.): SpO2 98-99% on 3L at rest, droppnig to 88% during gait with pt able to recover with PLB during ambulation to >90%.      Pertinent  Vitals/Pain Pain Assessment Pain Assessment: No/denies pain    Home Living                          Prior Function            PT Goals (current goals can now be found in the care plan section) Acute Rehab PT Goals Patient Stated Goal: Return Home PT Goal Formulation: With patient/family Time For Goal Achievement: 07/26/23 Progress towards PT goals: Progressing toward goals    Frequency    Min 2X/week      PT Plan      Co-evaluation              AM-PAC PT 6 Clicks Mobility   Outcome Measure  Help needed turning from your back to your side while in a flat bed without using bedrails?: A Little Help needed moving from lying on your back to sitting on the side of a flat bed without using bedrails?: A Little Help needed moving to and from a bed to a chair (including a wheelchair)?: A Little Help needed standing up from a chair using your arms (e.g., wheelchair or bedside chair)?: A Little Help needed to walk in hospital room?: A Lot Help needed climbing 3-5 steps with a railing? : Total 6 Click Score: 15    End of Session Equipment Utilized During Treatment: Oxygen Activity Tolerance: Patient limited by fatigue;Patient tolerated treatment well Patient left: with call bell/phone within reach;in chair Nurse Communication: Mobility status PT Visit Diagnosis: Other abnormalities of gait and mobility (R26.89);Muscle weakness (generalized) (M62.81)     Time: 8841-8778 PT Time Calculation (min) (ACUTE ONLY): 23 min  Charges:    $Gait Training: 8-22 mins $Therapeutic Exercise: 8-22 mins PT General Charges $$ ACUTE PT VISIT: 1 Visit                     [name] R. PTA Acute Rehabilitation Services Office: [phone]   [name] 07/17/2023, 12:45 PM

## 2023-07-17 NOTE — TOC Progression Note (Signed)
 Transition of Care [facility]) - Progression Note    Patient Details  Name: [name] MRN: [id number] Date of Birth: December 13, 1935  [facility] CM/SW Contact  Valery Gaucher, [place] Phone Number: 07/17/2023, 10:17 AM  Clinical Narrative:     CSW informed by Medical Student, patient is stable for d/c. Insurance authorization started w/ [employer] for SNF and PTAR.   [facility] updated  TOC will continue to follow and assist with discharge planning.  Liddie Reel, MSW, LCSW Clinical Social Worker    Expected Discharge Plan: Skilled Nursing Facility Barriers to Discharge: Insurance Authorization  Expected Discharge Plan and Services In-house Referral: Clinical Social Work     Living arrangements for the past 2 months: Single Family Home                                       Social Determinants of Health (SDOH) Interventions SDOH Screenings   Food Insecurity: No Food Insecurity (07/13/2023)  Housing: Low Risk  (07/13/2023)  Transportation Needs: No Transportation Needs (07/13/2023)  Utilities: Not At Risk (07/13/2023)  Alcohol  Screen: Low Risk  (08/05/2022)  Depression (PHQ2-9): Low Risk  (01/22/2023)  Financial Resource Strain: Low Risk  (08/05/2022)  Physical Activity: Inactive (08/05/2022)  Social Connections: Socially Isolated (07/16/2023)  Stress: No Stress Concern Present (08/05/2022)  Tobacco Use: High Risk (06/23/2023)    Readmission Risk Interventions     No data to display

## 2023-07-17 NOTE — Progress Notes (Signed)
 HD#6 Subjective:   Summary: [name] is a 88 y.o. female with pertinent PMH of severe COPD with recently no chronic respiratory failure on 2 L O2 at home, CHF, complete heart block with pacemaker, hypertension, and prior TAVR who presented on 07/11/2023 with worsening shortness of breath and is admitted for acute on chronic hypoxic respiratory failure. Since admission she has been treated for COPD exacerbation and had her anxiety medications changed from an anticholinergic (hydroxyzine ) to buspirone  and as needed morphine  sublingual for anxiety and air hunger with good progress.  She is at baseline and requires 2 L of oxygen through nasal cannula.  Today she is doing well without any new or worsening complaints.  She did not sleep very well last night but attributes this mostly to the hospital bed.  She denies any anxiety attacks overnight that caused significant shortness of breath.  She did not use the as needed morphine  overnight either.  Objective:  Vital signs in last 24 hours: Vitals:   07/16/23 1935 07/16/23 2024 07/16/23 2314 07/17/23 0300  BP: (!) 137/55  (!) 146/61 (!) 140/40  Pulse: 87 91 87 79  Resp: 18  16 16   Temp: 97.7 F (36.5 C)  97.8 F (36.6 C) 97.8 F (36.6 C)  TempSrc: Oral  Oral Oral  SpO2: 100% 98% 95% 98%  Weight:    47.6 kg  Height:       Supplemental O2: Nasal Cannula SpO2: 98 % O2 Flow Rate (L/min): 3 L/min FiO2 (%): 50 %   Physical Exam:  Constitutional: Thin but well-appearing elderly female laying in bed, in no acute distress Pulmonary/Chest: normal work of breathing  Neurological: alert & oriented x 3, No focal deficits noted  Filed Weights   07/14/23 0500 07/16/23 0325 07/17/23 0300  Weight: 50.7 kg 48.2 kg 47.6 kg     No intake or output data in the 24 hours ending 07/17/23 0611 Net IO Since Admission: -1,089.71 mL [07/17/23 0611]  Pertinent Labs:    Latest Ref Rng & Units 07/15/2023    3:17 AM 07/13/2023    3:50 AM 07/11/2023     6:28 AM  CBC  WBC 4.0 - 10.5 K/uL 11.2  12.2  18.2   Hemoglobin 12.0 - 15.0 g/dL 65.7  84.6  96.2    95.2   Hematocrit 36.0 - 46.0 % 34.4  31.5  35.0    35.2   Platelets 150 - 400 K/uL 221  221  346        Latest Ref Rng & Units 07/13/2023    3:50 AM 07/11/2023    6:28 AM 07/11/2023    6:27 AM  CMP  Glucose 70 - 99 mg/dL 841  324  401   BUN 8 - 23 mg/dL 33  19  20   Creatinine 0.44 - 1.00 mg/dL 0.27  2.53  6.64   Sodium 135 - 145 mmol/L 138  139    139  139   Potassium 3.5 - 5.1 mmol/L 3.8  4.0    4.1  4.0   Chloride 98 - 111 mmol/L 104  107  107   CO2 22 - 32 mmol/L 25  24    Calcium 8.9 - 10.3 mg/dL 8.8  8.8    Total Protein 6.5 - 8.1 g/dL  6.3    Total Bilirubin 0.0 - 1.2 mg/dL  0.6    Alkaline Phos 38 - 126 U/L  84    AST 15 - 41 U/L  38    ALT 0 - 44 U/L  35      Assessment/Plan:   Principal Problem:   Respiratory failure with hypoxia and hypercapnia (HCC) Active Problems:   COPD exacerbation (HCC)   Protein calorie malnutrition (HCC)   Volume overload   Left arm swelling   Anxiety   Advance care planning   Heart failure with preserved ejection fraction (HCC)   Anemia   Patient Summary: [name] is a 88 y.o. female with pertinent PMH of severe COPD with recently no chronic respiratory failure on 2 L O2 at home, CHF, complete heart block with pacemaker, hypertension, and prior TAVR who presented on 07/11/2023 with worsening shortness of breath and is admitted for acute on chronic hypoxic respiratory failure, on hospital day 6.   Acute on chronic hypoxic respiratory failure COPD Stable on 2 L O2 through the nasal cannula and has not required BiPAP in about 3 days.  As below treating complicating anxiety and switch to Breztri  inhaler from nebulizers today. - Continue triple therapy inhaler, as needed ipratropium, keep O2 sats 88-92  Anxiety -Continue buspirone  15 mg twice daily and morphine  5 mg sublingual every 6 hours as needed for air hunger and  anxiety  HFpEF Appears euvolemic and continues on Lasix  20 mg daily.  Left arm swelling Stable and improved from admission.  No DVT on Doppler.  Anemia-stable, no daily labs for monitoring needed Protein calorie malnutrition-protein shakes Hypertension-amlodipine  10 mg daily   Dispo: Anticipated discharge to Skilled nursing facility in 1 days pending insurance authorization. Patient is medically stable to d/c.  Cleven Dallas, DO Internal Medicine Resident, PGY-2 Please contact the on call pager at [phone] for any urgent or emergent needs. 6:11 AM 07/17/2023

## 2023-07-17 NOTE — Progress Notes (Signed)
 Patient declined BIPAP for the night. Patient has no c/o being short of breath at this time.

## 2023-07-18 DIAGNOSIS — J9622 Acute and chronic respiratory failure with hypercapnia: Secondary | ICD-10-CM | POA: Diagnosis not present

## 2023-07-18 DIAGNOSIS — J9621 Acute and chronic respiratory failure with hypoxia: Secondary | ICD-10-CM | POA: Diagnosis not present

## 2023-07-18 NOTE — Progress Notes (Signed)
 Mobility Specialist Progress Note:    07/18/23 1517  Mobility  Activity Ambulated with assistance in hallway  Level of Assistance Contact guard assist, steadying assist  Assistive Device Front wheel walker  Distance Ambulated (ft) 150 ft  Activity Response Tolerated well  Mobility Referral Yes  Mobility visit 1 Mobility  Mobility Specialist Start Time (ACUTE ONLY) 1435  Mobility Specialist Stop Time (ACUTE ONLY) 1453  Mobility Specialist Time Calculation (min) (ACUTE ONLY) 18 min   Pt received in bed agreeable to mobility. Upon reaching EOB, MS noticed pts gown was wet, assisted in linen change. No physical assistance required just contact guard for safety. No c/o throughout session. Ambulated on 3L/min VSS. Returned to room w/o fault. Left in chair w/ call bell and personal belongings in reach. All needs met. RN aware.  Inetta Manes Mobility Specialist  Please contact vis Secure Chat or  Rehab Office [phone]

## 2023-07-18 NOTE — Plan of Care (Signed)

## 2023-07-18 NOTE — Plan of Care (Signed)
   Problem: Education: Goal: Knowledge of General Education information will improve Description Including pain rating scale, medication(s)/side effects and non-pharmacologic comfort measures Outcome: Progressing   Problem: Health Behavior/Discharge Planning: Goal: Ability to manage health-related needs will improve Outcome: Progressing

## 2023-07-18 NOTE — Progress Notes (Signed)
 HD#7 Subjective:   Summary: [name] is a 88 y.o. female with pertinent PMH of severe COPD with recently no chronic respiratory failure on 2 L O2 at home, CHF, complete heart block with pacemaker, hypertension, and prior TAVR who presented on 07/11/2023 with worsening shortness of breath and is admitted for acute on chronic hypoxic respiratory failure. Since admission she has been treated for COPD exacerbation and had her anxiety medications changed from an anticholinergic (hydroxyzine ) to buspirone  and as needed morphine  sublingual for anxiety and air hunger with good progress.  She is at baseline and requires 2 L of oxygen through nasal cannula.  She continues to do well but had another anxiety attack overnight with resolution after receiving her sublingual morphine .  Denies any other new or worsening complaints.  Objective:  Vital signs in last 24 hours: Vitals:   07/18/23 0304 07/18/23 0400 07/18/23 0500 07/18/23 0910  BP: (!) 133/46     Pulse: 83   87  Resp: 20   (!) 21  Temp:  98.2 F (36.8 C)    TempSrc:      SpO2: 94%   95%  Weight:   52.4 kg   Height:       Supplemental O2: Nasal Cannula SpO2: 95 % O2 Flow Rate (L/min): 3 L/min FiO2 (%): 50 %   Physical Exam:  Constitutional: Thin but well-appearing elderly female laying in bed, in no acute distress Pulmonary/Chest: normal work of breathing  Neurological: alert & oriented x 3, No focal deficits noted  Filed Weights   07/16/23 0325 07/17/23 0300 07/18/23 0500  Weight: 48.2 kg 47.6 kg 52.4 kg      Intake/Output Summary (Last 24 hours) at 07/18/2023 1144 Last data filed at 07/18/2023 0500 Gross per 24 hour  Intake --  Output 200 ml  Net -200 ml   Net IO Since Admission: -1,589.71 mL [07/18/23 1144]  Pertinent Labs:    Latest Ref Rng & Units 07/15/2023    3:17 AM 07/13/2023    3:50 AM 07/11/2023    6:28 AM  CBC  WBC 4.0 - 10.5 K/uL 11.2  12.2  18.2   Hemoglobin 12.0 - 15.0 g/dL 16.1  09.6  04.5     40.9   Hematocrit 36.0 - 46.0 % 34.4  31.5  35.0    35.2   Platelets 150 - 400 K/uL 221  221  346        Latest Ref Rng & Units 07/13/2023    3:50 AM 07/11/2023    6:28 AM 07/11/2023    6:27 AM  CMP  Glucose 70 - 99 mg/dL 811  914  782   BUN 8 - 23 mg/dL 33  19  20   Creatinine 0.44 - 1.00 mg/dL 9.56  2.13  0.86   Sodium 135 - 145 mmol/L 138  139    139  139   Potassium 3.5 - 5.1 mmol/L 3.8  4.0    4.1  4.0   Chloride 98 - 111 mmol/L 104  107  107   CO2 22 - 32 mmol/L 25  24    Calcium 8.9 - 10.3 mg/dL 8.8  8.8    Total Protein 6.5 - 8.1 g/dL  6.3    Total Bilirubin 0.0 - 1.2 mg/dL  0.6    Alkaline Phos 38 - 126 U/L  84    AST 15 - 41 U/L  38    ALT 0 - 44 U/L  35  Assessment/Plan:   Principal Problem:   Respiratory failure with hypoxia and hypercapnia (HCC) Active Problems:   COPD exacerbation (HCC)   Protein calorie malnutrition (HCC)   Volume overload   Left arm swelling   Anxiety   Advance care planning   Heart failure with preserved ejection fraction (HCC)   Anemia   Patient Summary: [name] is a 88 y.o. female with pertinent PMH of severe COPD with recently no chronic respiratory failure on 2 L O2 at home, CHF, complete heart block with pacemaker, hypertension, and prior TAVR who presented on 07/11/2023 with worsening shortness of breath and is admitted for acute on chronic hypoxic respiratory failure, on hospital day 7.   Acute on chronic hypoxic respiratory failure COPD Stable on 2 L O2 through the nasal cannula and has not required BiPAP in about 3 days.  As below treating complicating anxiety and switch to Breztri  inhaler from nebulizers today. - Continue triple therapy inhaler, as needed ipratropium, keep O2 sats 88-92  Anxiety -Continue buspirone  15 mg twice daily and morphine  5 mg sublingual every 6 hours as needed for air hunger and anxiety  HFpEF Appears euvolemic and continues on Lasix  20 mg daily.  Left arm swelling Stable and  improved from admission.  No DVT on Doppler.  Anemia-stable, no daily labs for monitoring needed Protein calorie malnutrition-protein shakes Hypertension-amlodipine  10 mg daily   Dispo: Anticipated discharge to Skilled nursing facility in 1 days pending insurance authorization. Patient is medically stable to d/c.  Cleven Dallas, DO Internal Medicine Resident, PGY-2 Please contact the on call pager at [phone] for any urgent or emergent needs. 11:44 AM 07/18/2023

## 2023-07-18 NOTE — TOC Progression Note (Addendum)
 Transition of Care [facility]) - Progression Note    Patient Details  Name: [name] MRN: [id number] Date of Birth: 28-Apr-1935  Transition of Care [facility]) CM/SW Contact  Carmon Christen, LCSWA Phone Number: 07/18/2023, 12:05 PM  Clinical Narrative:     CSW LVM for Debria Fang with [employer]TA. CSW awaiting call back to check status of insurance authorization for SNF and PTAR. CSW will continue to follow and assist with patients dc planning needs.  UpdateDebria Fang with HTA  confirmed Patient has been approved for SNF [phone] and Lyna Sandhoff has been approved # [id number].  CSW LVM for [facility] at [facility].CSW spoke with receptionist at [facility] place unable to get a hold of star. Receptionist informed CSW star not working this weekend . Facility unable to accept patient over the weekend. CSW informed MD.  Expected Discharge Plan: Skilled Nursing Facility Barriers to Discharge: Insurance Authorization  Expected Discharge Plan and Services In-house Referral: Clinical Social Work     Living arrangements for the past 2 months: Single Family Home                                       Social Determinants of Health (SDOH) Interventions SDOH Screenings   Food Insecurity: No Food Insecurity (07/13/2023)  Housing: Low Risk  (07/13/2023)  Transportation Needs: No Transportation Needs (07/13/2023)  Utilities: Not At Risk (07/13/2023)  Alcohol  Screen: Low Risk  (08/05/2022)  Depression (PHQ2-9): Low Risk  (01/22/2023)  Financial Resource Strain: Low Risk  (08/05/2022)  Physical Activity: Inactive (08/05/2022)  Social Connections: Socially Isolated (07/16/2023)  Stress: No Stress Concern Present (08/05/2022)  Tobacco Use: High Risk (06/23/2023)    Readmission Risk Interventions     No data to display

## 2023-07-19 DIAGNOSIS — J9622 Acute and chronic respiratory failure with hypercapnia: Secondary | ICD-10-CM | POA: Diagnosis not present

## 2023-07-19 DIAGNOSIS — Z515 Encounter for palliative care: Secondary | ICD-10-CM | POA: Diagnosis not present

## 2023-07-19 DIAGNOSIS — Z7189 Other specified counseling: Secondary | ICD-10-CM | POA: Diagnosis not present

## 2023-07-19 DIAGNOSIS — J9621 Acute and chronic respiratory failure with hypoxia: Secondary | ICD-10-CM | POA: Diagnosis not present

## 2023-07-19 MED ORDER — BUSPIRONE HCL 5 MG PO TABS
20.0000 mg | ORAL_TABLET | Freq: Two times a day (BID) | ORAL | Status: DC
  Administered 2023-07-19 – 2023-07-20 (×2): 20 mg via ORAL
  Filled 2023-07-19 (×2): qty 4

## 2023-07-19 NOTE — Plan of Care (Signed)
   Problem: Education: Goal: Knowledge of General Education information will improve Description Including pain rating scale, medication(s)/side effects and non-pharmacologic comfort measures Outcome: Progressing   Problem: Health Behavior/Discharge Planning: Goal: Ability to manage health-related needs will improve Outcome: Progressing

## 2023-07-19 NOTE — Plan of Care (Signed)

## 2023-07-19 NOTE — Progress Notes (Signed)
 Mobility Specialist Progress Note:    07/19/23 1459  Mobility  Activity Ambulated with assistance in hallway  Level of Assistance Contact guard assist, steadying assist  Assistive Device Front wheel walker  Distance Ambulated (ft) 150 ft  Activity Response Tolerated well  Mobility Referral Yes  Mobility visit 1 Mobility  Mobility Specialist Start Time (ACUTE ONLY) 1125  Mobility Specialist Stop Time (ACUTE ONLY) 1138  Mobility Specialist Time Calculation (min) (ACUTE ONLY) 13 min   Pt received in bed agreeable to mobility. No physical assistance required just contact guard for safety  07/19/23 1459  Mobility  Activity Ambulated with assistance in hallway  Level of Assistance Contact guard assist, steadying assist  Assistive Device Front wheel walker  Distance Ambulated (ft) 150 ft  Activity Response Tolerated well  Mobility Referral Yes  Mobility visit 1 Mobility  Mobility Specialist Start Time (ACUTE ONLY) 1125  Mobility Specialist Stop Time (ACUTE ONLY) 1138  Mobility Specialist Time Calculation (min) (ACUTE ONLY) 13 min     07/19/23 1459  Mobility  Activity Ambulated with assistance in hallway  Level of Assistance Contact guard assist, steadying assist  Assistive Device Front wheel walker  Distance Ambulated (ft) 150 ft  Activity Response Tolerated well  Mobility Referral Yes  Mobility visit 1 Mobility  Mobility Specialist Start Time (ACUTE ONLY) 1125  Mobility Specialist Stop Time (ACUTE ONLY) 1138  Mobility Specialist Time Calculation (min) (ACUTE ONLY) 13 min   Pt received in bed agreeable to mobility. No physical assistance required. No c/o pain throughout, pt became a little anxious at EOS. Returned to room w/o fault. Situated in chair w/ call bell and personal belongings in reach. All needs met. RN aware.  Inetta Manes Mobility Specialist  Please contact vis Secure Chat or  Rehab Office [phone]

## 2023-07-19 NOTE — Progress Notes (Signed)
 HD#8 Subjective:   Summary: [name] is a 88 y.o. female with pertinent PMH of severe COPD with recently no chronic respiratory failure on 2 L O2 at home, CHF, complete heart block with pacemaker, hypertension, and prior TAVR who presented on 07/11/2023 with worsening shortness of breath and is admitted for acute on chronic hypoxic respiratory failure. Since admission she has been treated for COPD exacerbation and had her anxiety medications changed from an anticholinergic (hydroxyzine ) to buspirone  and as needed morphine  sublingual for anxiety and air hunger with good progress.  She is at baseline and requires 2 L of oxygen through nasal cannula.  She continues to do well and did not have any significant anxiety overnight.   Objective:  Vital signs in last 24 hours: Vitals:   07/18/23 2100 07/18/23 2259 07/19/23 0302 07/19/23 0455  BP:  117/65 (!) 107/37   Pulse:  84 73 77  Resp:  20 20   Temp:  98.4 F (36.9 C) 97.8 F (36.6 C)   TempSrc:  Oral Oral   SpO2: 94% 94% 92% 98%  Weight:    52.8 kg  Height:       Supplemental O2: Nasal Cannula SpO2: 98 % O2 Flow Rate (L/min): 3 L/min FiO2 (%): 50 %   Physical Exam:  Constitutional: Thin but well-appearing elderly female laying in bed, in no acute distress Pulmonary/Chest: normal work of breathing  Neurological: alert & oriented x 3, No focal deficits noted  Filed Weights   07/17/23 0300 07/18/23 0500 07/19/23 0455  Weight: 47.6 kg 52.4 kg 52.8 kg      Intake/Output Summary (Last 24 hours) at 07/19/2023 0928 Last data filed at 07/19/2023 0457 Gross per 24 hour  Intake --  Output 600 ml  Net -600 ml   Net IO Since Admission: -2,189.71 mL [07/19/23 0928]  Pertinent Labs:    Latest Ref Rng & Units 07/15/2023    3:17 AM 07/13/2023    3:50 AM 07/11/2023    6:28 AM  CBC  WBC 4.0 - 10.5 K/uL 11.2  12.2  18.2   Hemoglobin 12.0 - 15.0 g/dL 66.4  40.3  47.4    25.9   Hematocrit 36.0 - 46.0 % 34.4  31.5  35.0    35.2    Platelets 150 - 400 K/uL 221  221  346        Latest Ref Rng & Units 07/13/2023    3:50 AM 07/11/2023    6:28 AM 07/11/2023    6:27 AM  CMP  Glucose 70 - 99 mg/dL 563  875  643   BUN 8 - 23 mg/dL 33  19  20   Creatinine 0.44 - 1.00 mg/dL 3.29  5.18  8.41   Sodium 135 - 145 mmol/L 138  139    139  139   Potassium 3.5 - 5.1 mmol/L 3.8  4.0    4.1  4.0   Chloride 98 - 111 mmol/L 104  107  107   CO2 22 - 32 mmol/L 25  24    Calcium 8.9 - 10.3 mg/dL 8.8  8.8    Total Protein 6.5 - 8.1 g/dL  6.3    Total Bilirubin 0.0 - 1.2 mg/dL  0.6    Alkaline Phos 38 - 126 U/L  84    AST 15 - 41 U/L  38    ALT 0 - 44 U/L  35      Assessment/Plan:   Principal Problem:  Respiratory failure with hypoxia and hypercapnia (HCC) Active Problems:   COPD exacerbation (HCC)   Protein calorie malnutrition (HCC)   Volume overload   Left arm swelling   Anxiety   Advance care planning   Heart failure with preserved ejection fraction (HCC)   Anemia   Patient Summary: [name] is a 88 y.o. female with pertinent PMH of severe COPD with recently no chronic respiratory failure on 2 L O2 at home, CHF, complete heart block with pacemaker, hypertension, and prior TAVR who presented on 07/11/2023 with worsening shortness of breath and is admitted for acute on chronic hypoxic respiratory failure, on hospital day 8.   Acute on chronic hypoxic respiratory failure COPD Stable on 2 L O2 through the nasal cannula. - Continue triple therapy inhaler, as needed ipratropium, supplemental O2, keep O2 sats 88-92  Anxiety -Continue buspirone  15 mg twice daily and morphine  5 mg sublingual every 6 hours as needed for air hunger and anxiety -trazodone  25 mg nightly for sleep  HFpEF Appears euvolemic and continues on Lasix  20 mg daily.  Left arm swelling Stable and improved from admission.  No DVT on Doppler.  Anemia-stable, no daily labs for monitoring needed Protein calorie malnutrition-protein  shakes Hypertension-amlodipine  10 mg daily   Dispo: Anticipated discharge to Skilled nursing facility in 1 days pending bed availability. Patient is medically stable to d/c. Discussed with patient, daughter, and SW today.  Cleven Dallas, DO Internal Medicine Resident, PGY-2 Please contact the on call pager at [phone] for any urgent or emergent needs. 9:28 AM 07/19/2023

## 2023-07-19 NOTE — Plan of Care (Signed)
  Problem: Education: Goal: Knowledge of General Education information will improve Description: Including pain rating scale, medication(s)/side effects and non-pharmacologic comfort measures 07/19/2023 1652 by Corlis Dienes, RN Outcome: Progressing 07/19/2023 1650 by Corlis Dienes, RN Outcome: Progressing 07/19/2023 1636 by Corlis Dienes, RN Outcome: Progressing   Problem: Health Behavior/Discharge Planning: Goal: Ability to manage health-related needs will improve 07/19/2023 1652 by Corlis Dienes, RN Outcome: Progressing 07/19/2023 1650 by Corlis Dienes, RN Outcome: Progressing

## 2023-07-19 NOTE — Plan of Care (Signed)
   Problem: Education: Goal: Knowledge of General Education information will improve Description Including pain rating scale, medication(s)/side effects and non-pharmacologic comfort measures Outcome: Progressing

## 2023-07-19 NOTE — TOC Progression Note (Signed)
 Transition of Care [facility]) - Progression Note    Patient Details  Name: [name] MRN: [id number] Date of Birth: 10/24/1935  Transition of Care [facility]) CM/SW Contact  Valley Gavia, LCSWA Phone Number: 07/19/2023, 9:23 AM  Clinical Narrative:     Pt approved for SNF and PTAR at [facility], unable to contact AD Daril Edge, spoke with receptionist, Spence Dux, who states she is unable to contact [facility] and pt cannot come today without approval from [place]. TOC will continue to follow.   Expected Discharge Plan: Skilled Nursing Facility Barriers to Discharge: Insurance Authorization  Expected Discharge Plan and Services In-house Referral: Clinical Social Work     Living arrangements for the past 2 months: Single Family Home                                       Social Determinants of Health (SDOH) Interventions SDOH Screenings   Food Insecurity: No Food Insecurity (07/13/2023)  Housing: Low Risk  (07/13/2023)  Transportation Needs: No Transportation Needs (07/13/2023)  Utilities: Not At Risk (07/13/2023)  Alcohol  Screen: Low Risk  (08/05/2022)  Depression (PHQ2-9): Low Risk  (01/22/2023)  Financial Resource Strain: Low Risk  (08/05/2022)  Physical Activity: Inactive (08/05/2022)  Social Connections: Socially Isolated (07/16/2023)  Stress: No Stress Concern Present (08/05/2022)  Tobacco Use: High Risk (06/23/2023)    Readmission Risk Interventions     No data to display

## 2023-07-19 NOTE — Progress Notes (Signed)
   Palliative Medicine Inpatient Follow Up Note HPI: [name] is a 88 y.o. with severe COPD and history of HFpEF who presents with dyspnea and wheezing is admitted for acute on chronic hypoxic and hypercapnic respiratory failure.   The Palliative care team has been asked to get involved for further foals of care conversations.  Today's Discussion 07/19/2023  *Please note that this is a verbal dictation therefore any spelling or grammatical errors are due to the "Dragon Medical One" system interpretation.  Chart reviewed inclusive of vital signs, progress notes, laboratory results, and diagnostic images.   I spoke to [facility] this morning. She is feeling much better overall she shares. She feels that her anxiety is much improved over the past few days. We reviewed the various medications she is on to support this in additional to other breathing techniques.   [name] shares that the morphine  has helped her more with air hunger.  We discussed the plan for [name] to transition to skilled nursing. She does endorse readiness and is motivated to build strength.   Plan for transition to [facility] tomorrow.  Questions and concerns addressed/Palliative Support Provided.   Objective Assessment: Vital Signs Vitals:   07/19/23 0835 07/19/23 1215  BP: (!) 143/48 (!) 131/34  Pulse: 88 80  Resp: 20 20  Temp: 98.5 F (36.9 C) 98.1 F (36.7 C)  SpO2: 100% 94%    Intake/Output Summary (Last 24 hours) at 07/19/2023 1316 Last data filed at 07/19/2023 1218 Gross per 24 hour  Intake --  Output 1100 ml  Net -1100 ml   Last Weight  Most recent update: 07/19/2023  4:55 AM    Weight  52.8 kg (116 lb 8 oz)            Gen: Chronically ill-appearing elderly Caucasian female HEENT: moist mucous membranes CV: Regular rate and rhythm  PULM: On 4L breathing is even and unlabored ABD: soft/nontender  EXT: No edema  Neuro: Alert and oriented x3   SUMMARY OF RECOMMENDATIONS   DNAR/DNI    DNR/MOST form completed and placed on the paper Chart    Patient is open to OP Palliative support on discharge   Patients goal is to be functional without significant SOB   PMT support as needed  Time: 25 ______________________________________________________________________________________ [place] [place] Palliative Medicine Team Team Cell Phone: [phone] Please utilize secure chat with additional questions, if there is no response within 30 minutes please call the above phone number  Palliative Medicine Team providers are available by phone from 7am to 7pm daily and can be reached through the team cell phone.  Should this patient require assistance outside of these hours, please call the patient's attending physician.

## 2023-07-19 NOTE — Plan of Care (Signed)
  Problem: Education: Goal: Knowledge of General Education information will improve Description: Including pain rating scale, medication(s)/side effects and non-pharmacologic comfort measures 07/19/2023 1650 by Corlis Dienes, RN Outcome: Progressing 07/19/2023 1636 by Corlis Dienes, RN Outcome: Progressing   Problem: Health Behavior/Discharge Planning: Goal: Ability to manage health-related needs will improve Outcome: Progressing

## 2023-07-20 ENCOUNTER — Inpatient Hospital Stay (HOSPITAL_COMMUNITY)

## 2023-07-20 DIAGNOSIS — J9600 Acute respiratory failure, unspecified whether with hypoxia or hypercapnia: Secondary | ICD-10-CM | POA: Diagnosis not present

## 2023-07-20 DIAGNOSIS — J441 Chronic obstructive pulmonary disease with (acute) exacerbation: Secondary | ICD-10-CM

## 2023-07-20 DIAGNOSIS — I5033 Acute on chronic diastolic (congestive) heart failure: Secondary | ICD-10-CM

## 2023-07-20 MED ORDER — BUSPIRONE HCL 5 MG PO TABS
10.0000 mg | ORAL_TABLET | Freq: Three times a day (TID) | ORAL | Status: DC
  Administered 2023-07-20 – 2023-07-21 (×3): 10 mg via ORAL
  Filled 2023-07-20 (×3): qty 2

## 2023-07-20 MED ORDER — FUROSEMIDE 10 MG/ML IJ SOLN
40.0000 mg | Freq: Once | INTRAMUSCULAR | Status: AC
  Administered 2023-07-20: 40 mg via INTRAVENOUS
  Filled 2023-07-20: qty 4

## 2023-07-20 NOTE — Progress Notes (Addendum)
 Mobility Specialist Progress Note:    07/20/23 1221  Mobility  Activity Stood at bedside  Level of Assistance Contact guard assist, steadying assist  Assistive Device Front wheel walker  Distance Ambulated (ft) 0 ft  Activity Response Tolerated fair  Mobility Referral Yes  Mobility visit 1 Mobility  Mobility Specialist Start Time (ACUTE ONLY) 1200  Mobility Specialist Stop Time (ACUTE ONLY) 1220  Mobility Specialist Time Calculation (min) (ACUTE ONLY) 20 min   Pt received in bed, agreeable to mobility session. Niece in room. RN medicated pt for anxiety during session. Pt able to stand at bedside and attempt a few marches. Pt only agreeable for STS via RW d/t wet linens and gown change. Declined ambulation d/t anxiety and fears of SOB despite max encouragement. Pt agreeable to sitting up in chair later today. Monitored SpO2 throughout session.   SpO2 81% on RA at rest.  SpO2 79% on RA dangling EOB SpO2 88% on 8L dangling EOB. SpO2 93% on 8L standing and marching in place.  SpO2 79% on 4L lying in bed, post mobility. Increased O2 flow, SpO2 88% on 8L.   Fabrizio Filip Mobility Specialist Please contact via Special educational needs teacher or  Rehab office at [phone]

## 2023-07-20 NOTE — Progress Notes (Signed)
 Nurse requested Mobility Specialist to perform oxygen saturation test with pt which includes removing pt from oxygen both at rest and while ambulating.  Below are the results from that testing.     Patient Saturations on Room Air at Rest = spO2 81%  Patient Saturations on Room Air while Ambulating = sp02 79% .  Rested and performed pursed lip breathing for 1 minute with sp02 at 79%.  Patient Saturations on 8 Liters of oxygen while marching in place = sp02 93%  At end of testing pt left in room on 8  Liters of oxygen.  Reported results to nurse.    Nessa Ramaker Mobility Specialist Please contact via Special educational needs teacher or  Rehab office at [phone]

## 2023-07-20 NOTE — Plan of Care (Signed)
   Problem: Coping: Goal: Level of anxiety will decrease Outcome: Progressing

## 2023-07-20 NOTE — Progress Notes (Addendum)
   Subjective:  [name] is very anxious this morning. She continued to insist that she could not go back to [facility] in her current state of health because she "cannot move" without becoming anxious. She continues to ask about prozac for her anxiety as it works for her sister. When mobility specialist came to see her in the early afternoon, she desaturated to 79% while marching in place and required [facility] to return to oxygen saturation to goal.  Objective:  Vital signs in last 24 hours: Vitals:   07/19/23 2055 07/19/23 2254 07/20/23 0314 07/20/23 0816  BP:  (!) 125/39 (!) 104/33 (!) 143/45  Pulse:  87 79 87  Resp:  20 20 20   Temp:  98.1 F (36.7 C) 98.3 F (36.8 C) 98.2 F (36.8 C)  TempSrc:  Oral Oral Oral  SpO2: 97% 94% 90% (!) 89%  Weight:   53.1 kg   Height:       Weight change: 0.227 kg  Intake/Output Summary (Last 24 hours) at 07/20/2023 1324 Last data filed at 07/19/2023 1532 Gross per 24 hour  Intake --  Output 300 ml  Net -300 ml   GEN: Frail, no acute distress, laying in bed, able to converse comfortably, PULM: diffuse rhonchi and wheezes in bilateral lung fields, worst on RLL base PSYCH: Very concerned about panic attacks. She admits concerns about moving, and oxygen SKIN: Swelling present on medial aspect of L antecubital region  Assessment/Plan:  Patient Summary This is hospital day 9 for [name], a 88 y.o. with a PMH of moderate-severe COPD without O2 requirement at home, CHF, heart block with pacemaker, HTN and TAVR who presented on 07/11/2023 from SNF with worsening shortness of breath and is admitted for acute on chronic hypoxic respiratory failure.  Principal Problem   Respiratory failure with hypoxia and hypercapnia (HCC)   COPD exacerbation (HCC) Oxygen was at [facility] this morning. Tolerated decrease to 2L well. With mobility specialist, O2 dropped to 79% without O2, then required 8LNC to return saturation to 93% while marching in  place. CXR to evaluate for pneumonia iso worsening CXR on presentation. Will likely require O2 at discharge. - Triple therapy inhaler BID, ipratropium Q6H PRN - O2 as needed for 88-92% - CXR today  Active Problems   Anxiety Primary concern of the patient is her anxiety. Patient's niece also concerned about the patient's anxiety. Increasing her buspirone  to TID. - Increase buspirone  to 10 mg TID - Trazodone  25 mg at bedtime for sleep    Volume overload   Left arm swelling   Heart failure with preserved ejection fraction [facility]) Patient is euvolemic on 20 mg furosemide . L antecubital swelling is down from last week. - Continue 20 mg furosemide  daily  Chronic stable problems   Protein calorie malnutrition (HCC) Continue protein shakes   Hypertension Amlodipine  10 mg every day   Resolved problems   Anemia (resolved) Hgb stable last check, no indication for further monitoring.  VTE: enoxaprin 40 mg every day  Diet: Regular Code: DNR/DNI  Family Update: Talked to niece [name] about addressing anxiety. Increasing dosing of buspirone  to TID, and discussed that citalopram  is in the same family of medications as Prozac.  Dispo: Anticipated discharge to Skilled nursing facility in 1-2 days pending bed availability. Patient is stable, but O2 requirement is up today and is being worked up.    LOS: 9 days   Ferdinand Houseman, Medical Student 07/20/2023, 1:24 PM

## 2023-07-21 ENCOUNTER — Ambulatory Visit: Payer: Medicare HMO

## 2023-07-21 DIAGNOSIS — I5033 Acute on chronic diastolic (congestive) heart failure: Secondary | ICD-10-CM | POA: Diagnosis not present

## 2023-07-21 DIAGNOSIS — J441 Chronic obstructive pulmonary disease with (acute) exacerbation: Secondary | ICD-10-CM | POA: Diagnosis not present

## 2023-07-21 DIAGNOSIS — J9600 Acute respiratory failure, unspecified whether with hypoxia or hypercapnia: Secondary | ICD-10-CM | POA: Diagnosis not present

## 2023-07-21 LAB — CBC
HCT: 30.7 % — ABNORMAL LOW (ref 36.0–46.0)
Hemoglobin: 9.8 g/dL — ABNORMAL LOW (ref 12.0–15.0)
MCH: 30.2 pg (ref 26.0–34.0)
MCHC: 31.9 g/dL (ref 30.0–36.0)
MCV: 94.5 fL (ref 80.0–100.0)
Platelets: 160 10*3/uL (ref 150–400)
RBC: 3.25 MIL/uL — ABNORMAL LOW (ref 3.87–5.11)
RDW: 15.4 % (ref 11.5–15.5)
WBC: 6.2 10*3/uL (ref 4.0–10.5)
nRBC: 0 % (ref 0.0–0.2)

## 2023-07-21 LAB — BASIC METABOLIC PANEL WITH GFR
Anion gap: 10 (ref 5–15)
BUN: 17 mg/dL (ref 8–23)
CO2: 29 mmol/L (ref 22–32)
Calcium: 8 mg/dL — ABNORMAL LOW (ref 8.9–10.3)
Chloride: 98 mmol/L (ref 98–111)
Creatinine, Ser: 0.66 mg/dL (ref 0.44–1.00)
GFR, Estimated: >60 mL/min (ref >60)
Glucose, Bld: 99 mg/dL (ref 70–99)
Potassium: 3.7 mmol/L (ref 3.5–5.1)
Sodium: 137 mmol/L (ref 135–145)

## 2023-07-21 LAB — BRAIN NATRIURETIC PEPTIDE: B Natriuretic Peptide: 746 pg/mL — ABNORMAL HIGH (ref 0.0–100.0)

## 2023-07-21 MED ORDER — FUROSEMIDE 10 MG/ML IJ SOLN
40.0000 mg | Freq: Two times a day (BID) | INTRAMUSCULAR | Status: AC
  Administered 2023-07-21 (×2): 40 mg via INTRAVENOUS
  Filled 2023-07-21 (×2): qty 4

## 2023-07-21 MED ORDER — POTASSIUM CHLORIDE CRYS ER 20 MEQ PO TBCR
40.0000 meq | EXTENDED_RELEASE_TABLET | Freq: Once | ORAL | Status: AC
  Administered 2023-07-21: 40 meq via ORAL
  Filled 2023-07-21: qty 2

## 2023-07-21 MED ORDER — BUSPIRONE HCL 5 MG PO TABS
15.0000 mg | ORAL_TABLET | Freq: Three times a day (TID) | ORAL | Status: DC
  Administered 2023-07-21 – 2023-07-27 (×18): 15 mg via ORAL
  Filled 2023-07-21 (×18): qty 3

## 2023-07-21 NOTE — Plan of Care (Signed)

## 2023-07-21 NOTE — Progress Notes (Addendum)
 Subjective:  Today, [name] is feeling okay. She doesn't feel fit to leave, and thinks her breathing has become worse overall, though this morning is better than last night.  She continues to say that "if she moves, she will have a panic attack". She wants her anxiety under control before leaving the hospital. She is able to lean forward and back for lung examination without issue. She does have an increased oxygen requirement today, at [facility].  Objective:  Vital signs in last 24 hours: Vitals:   07/20/23 2021 07/21/23 0000 07/21/23 0500 07/21/23 0518  BP:  (!) 128/41  111/80  Pulse:    78  Resp:  20  18  Temp:  98.3 F (36.8 C)  98 F (36.7 C)  TempSrc:  Oral  Oral  SpO2: 94%   97%  Weight:   51.6 kg   Height:       Weight change: -1.471 kg  Intake/Output Summary (Last 24 hours) at 07/21/2023 0736 Last data filed at 07/21/2023 0538 Gross per 24 hour  Intake --  Output 1200 ml  Net -1200 ml   GEN: No acute distress, sitting up in bed, eating breakfast, conversing comfortably CV: regular rate PULM: Decreased breath sounds in bilateral bases, rhonchi in upper lobes SKIN: L antecubital swelling unchanged from yesterday EXT: No lower leg edema  Assessment/Plan:  Patient Summary This is hospital day 10 for [name], a 88 y.o. with a PMH of moderate-severe COPD without O2 requirement at home, CHF, heart block with pacemaker, HTN and TAVR who presented on 07/11/2023 from SNF with worsening shortness of breath and is admitted for acute on chronic hypoxic respiratory failure.  Principal Problem   Respiratory failure with hypoxia and hypercapnia (HCC)   COPD exacerbation (HCC) CXR shows worsening bilat pleural effusions and bibasilar opacities, likely atelectasis and pulmonary edema. Bedside POCUS shows bilateral effusions and atelactatic lung in RLL. - Triple therapy inhaler BID, ipratropium Q6H PRN - O2 as needed for 88-92%, wean down as tolerated  Active  Problems   Anxiety Required 1 dose of morphine  this morning for a panic attack. Plan to stop morphine  and increase buspirone . - Discontinue morphine  PRN - Increase buspirone  to 15 mg TID - Trazodone  25 mg at bedtime for sleep    Volume overload   Left arm swelling   Heart failure with preserved ejection fraction (HCC) Yesterday patient received 40 mg IV furosemide . BNP is stable this morning 746 from 805.7 at admission. Bedside POCUS shows bilateral effusions and atelactatic lung in RLL.Plan is to diurese to help with volume overload.  - 40 mg IV furosemide  BID    Anemia Hgb this morning is 9.8. Normocytic and normal RDW. She does cough up pink sputum, but does not seem to be enough to account for blood loss anemia. Iron  panel to evaluate for ACD. May benefit from Iron  supplementation. - Iron  panel - CBC tomorrow AM  Chronic stable problems   Protein calorie malnutrition (HCC) Continue protein shakes   Hypertension Amlodipine  10 mg every day   VTE: enoxaprin 40 mg every day  Diet: Regular Code: DNR/DNI  Family Update: Talked to niece Edwina Gram about addressing anxiety. Increasing dosing of buspirone  to TID, and discussed that citalopram  is in the same family of medications as Prozac.  Dispo: Anticipated discharge to Skilled nursing facility in 1-2 days pending bed availability. Patient is stable, but O2 requirement is up today and is being worked up.    LOS:  10 days   Ferdinand Houseman, Medical Student 07/21/2023, 7:36 AM

## 2023-07-21 NOTE — Progress Notes (Signed)
 Physical Therapy Treatment Patient Details Name: [name] MRN: [id number] DOB: 10-10-1935 Today's Date: 07/21/2023   History of Present Illness Pt is a 88 y.o. female admitted 07/11/23 for respiratory failure with hypoxia and hypercapnia. Pt presented with SOB and increased WOB. Workup was notable for mild acidosis, borderline high CO2 on her VBG.  PMH of COPD on 2-3L O2 at baseline, COVID Jan 2025, TAVR, PPM, GIB, endometrial CA, HTN, HLD, CAD, mild pulmonary hypertension. Of note, recent hospitalization for COPD exacerbation which required a stay in the ICU.    PT Comments  Pt resting in bed on arrival, pleasant and agreeable to session with encouragement as pt endorsing increased fear of panic attack with initiation of mobility. Pt able to transfer to stand with min A to steady on rise and step pivot EOB>chair with min A and HHA, pt without c/o increased anxiety with transfer. SpO2 91-95% on 6L throughout mobility. Pt declining further exercise and/or gait attempts. Pt up in chair with all needs met at end of session. Pt continues to benefit from skilled PT services to progress toward functional mobility goals.      If plan is discharge home, recommend the following: A little help with walking and/or transfers;A lot of help with bathing/dressing/bathroom;Assist for transportation;Help with stairs or ramp for entrance;Assistance with cooking/housework   Can travel by private vehicle     No  Equipment Recommendations  Wheelchair (measurements PT);Wheelchair cushion (measurements PT)    Recommendations for Other Services       Precautions / Restrictions Precautions Precautions: Fall Recall of Precautions/Restrictions: Intact Precaution/Restrictions Comments: Watch SpO2 Restrictions Weight Bearing Restrictions Per Provider Order: No     Mobility  Bed Mobility Overal bed mobility: Needs Assistance Bed Mobility: Supine to Sit     Supine to sit: Modified independent  (Device/Increase time), HOB elevated          Transfers Overall transfer level: Needs assistance Equipment used: 1 person hand held assist Transfers: Sit to/from Stand, Bed to chair/wheelchair/BSC Sit to Stand: Min assist Stand pivot transfers: Min assist         General transfer comment: min A to steady on rise and step pivto EOB>chair    Ambulation/Gait               General Gait Details: unable to attempt this session due to increased anxiety/ fear of panic attack   Stairs             Wheelchair Mobility     Tilt Bed    Modified Rankin (Stroke Patients Only)       Balance Overall balance assessment: Needs assistance Sitting-balance support: No upper extremity supported, Feet supported Sitting balance-Leahy Scale: Fair Sitting balance - Comments: in long sitting in bed   Standing balance support: Bilateral upper extremity supported, Single extremity supported, During functional activity Standing balance-Leahy Scale: Poor Standing balance comment: UE support required                            Communication Communication Communication: Impaired Factors Affecting Communication: Hearing impaired  Cognition Arousal: Alert Behavior During Therapy: WFL for tasks assessed/performed   PT - Cognitive impairments: No apparent impairments                       PT - Cognition Comments: She required increased time to perform all mobility, moderate encouragement to participate, and re-direction to help calm her down  due to pt self reporting more anxiety this date Following commands: Intact      Cueing Cueing Techniques: Verbal cues  Exercises      General Comments General comments (skin integrity, edema, etc.): SpO2 91-96% on 6L throughout session      Pertinent Vitals/Pain Pain Assessment Pain Assessment: No/denies pain    Home Living                          Prior Function            PT Goals (current  goals can now be found in the care plan section) Acute Rehab PT Goals PT Goal Formulation: With patient/family Time For Goal Achievement: 07/26/23 Progress towards PT goals: Progressing toward goals    Frequency    Min 2X/week      PT Plan      Co-evaluation              AM-PAC PT "6 Clicks" Mobility   Outcome Measure  Help needed turning from your back to your side while in a flat bed without using bedrails?: A Little Help needed moving from lying on your back to sitting on the side of a flat bed without using bedrails?: A Little Help needed moving to and from a bed to a chair (including a wheelchair)?: A Little Help needed standing up from a chair using your arms (e.g., wheelchair or bedside chair)?: A Little Help needed to walk in hospital room?: Total Help needed climbing 3-5 steps with a railing? : Total 6 Click Score: 14    End of Session Equipment Utilized During Treatment: Oxygen Activity Tolerance: Patient limited by fatigue;Patient tolerated treatment well Patient left: with call bell/phone within reach;in chair Nurse Communication: Mobility status;Patient requests pain meds PT Visit Diagnosis: Other abnormalities of gait and mobility (R26.89);Muscle weakness (generalized) (M62.81)     Time: 6962-9528 PT Time Calculation (min) (ACUTE ONLY): 14 min  Charges:    $Therapeutic Activity: 8-22 mins PT General Charges $$ ACUTE PT VISIT: 1 Visit                     Lainie Daubert R. PTA Acute Rehabilitation Services Office: [phone]   Agapito Horseman 07/21/2023, 3:34 PM

## 2023-07-21 NOTE — Plan of Care (Signed)
  Problem: Education: Goal: Knowledge of General Education information will improve Description: Including pain rating scale, medication(s)/side effects and non-pharmacologic comfort measures Outcome: Progressing   Problem: Health Behavior/Discharge Planning: Goal: Ability to manage health-related needs will improve Outcome: Progressing   Problem: Clinical Measurements: Goal: Ability to maintain clinical measurements within normal limits will improve Outcome: Progressing   Problem: Nutrition: Goal: Adequate nutrition will be maintained Outcome: Progressing   Problem: Coping: Goal: Level of anxiety will decrease Outcome: Progressing   Problem: Pain Managment: Goal: General experience of comfort will improve and/or be controlled Outcome: Progressing   Problem: Safety: Goal: Ability to remain free from injury will improve Outcome: Progressing   Problem: Skin Integrity: Goal: Risk for impaired skin integrity will decrease Outcome: Progressing

## 2023-07-21 NOTE — Progress Notes (Signed)
 PT Cancellation Note  Patient Details Name: [name] MRN: [id number] DOB: 1935-05-03   Cancelled Treatment:    Reason Eval/Treat Not Completed: (P) Other (comment), pt declining all mobility stating she feels like she will have a panic attack if mobility attempted. Educated pt on importance of mobility with pt agreeable for this PTA to return in PM. Will check back as schedule allows to continue with PT POC.  Beverly Buckler. PTA Acute Rehabilitation Services Office: [phone]      Agapito Horseman 07/21/2023, 10:30 AM

## 2023-07-22 ENCOUNTER — Other Ambulatory Visit (HOSPITAL_COMMUNITY): Payer: Self-pay

## 2023-07-22 ENCOUNTER — Telehealth (HOSPITAL_COMMUNITY): Payer: Self-pay | Admitting: Pharmacy Technician

## 2023-07-22 DIAGNOSIS — J9600 Acute respiratory failure, unspecified whether with hypoxia or hypercapnia: Secondary | ICD-10-CM | POA: Diagnosis not present

## 2023-07-22 DIAGNOSIS — J441 Chronic obstructive pulmonary disease with (acute) exacerbation: Secondary | ICD-10-CM | POA: Diagnosis not present

## 2023-07-22 DIAGNOSIS — I5033 Acute on chronic diastolic (congestive) heart failure: Secondary | ICD-10-CM | POA: Diagnosis not present

## 2023-07-22 LAB — BASIC METABOLIC PANEL WITH GFR
Anion gap: 12 (ref 5–15)
Anion gap: 6 (ref 5–15)
BUN: 23 mg/dL (ref 8–23)
BUN: 23 mg/dL (ref 8–23)
CO2: 33 mmol/L — ABNORMAL HIGH (ref 22–32)
CO2: 30 mmol/L (ref 22–32)
Calcium: 8.5 mg/dL — ABNORMAL LOW (ref 8.9–10.3)
Calcium: 8.6 mg/dL — ABNORMAL LOW (ref 8.9–10.3)
Chloride: 95 mmol/L — ABNORMAL LOW (ref 98–111)
Chloride: 99 mmol/L (ref 98–111)
Creatinine, Ser: 0.65 mg/dL (ref 0.44–1.00)
Creatinine, Ser: 0.58 mg/dL (ref 0.44–1.00)
GFR, Estimated: >60 mL/min (ref >60)
GFR, Estimated: >60 mL/min (ref >60)
Glucose, Bld: 102 mg/dL — ABNORMAL HIGH (ref 70–99)
Glucose, Bld: 115 mg/dL — ABNORMAL HIGH (ref 70–99)
Potassium: 3.6 mmol/L (ref 3.5–5.1)
Potassium: 3.6 mmol/L (ref 3.5–5.1)
Sodium: 140 mmol/L (ref 135–145)
Sodium: 135 mmol/L (ref 135–145)

## 2023-07-22 LAB — IRON AND TIBC
Iron: 31 ug/dL (ref 28–170)
Saturation Ratios: 11 % (ref 10.4–31.8)
TIBC: 290 ug/dL (ref 250–450)
UIBC: 259 ug/dL

## 2023-07-22 LAB — BLOOD GAS, VENOUS
Acid-Base Excess: 10.2 mmol/L — ABNORMAL HIGH (ref 0.0–2.0)
Bicarbonate: 36.1 mmol/L — ABNORMAL HIGH (ref 20.0–28.0)
Drawn by: 70593
O2 Saturation: 84.5 %
Patient temperature: 36.7
pCO2, Ven: 51 mmHg (ref 44–60)
pH, Ven: 7.45 — ABNORMAL HIGH (ref 7.25–7.43)
pO2, Ven: 49 mmHg — ABNORMAL HIGH (ref 32–45)

## 2023-07-22 LAB — CBC
HCT: 32.6 % — ABNORMAL LOW (ref 36.0–46.0)
Hemoglobin: 10.5 g/dL — ABNORMAL LOW (ref 12.0–15.0)
MCH: 30.5 pg (ref 26.0–34.0)
MCHC: 32.2 g/dL (ref 30.0–36.0)
MCV: 94.8 fL (ref 80.0–100.0)
Platelets: 169 10*3/uL (ref 150–400)
RBC: 3.44 MIL/uL — ABNORMAL LOW (ref 3.87–5.11)
RDW: 15.3 % (ref 11.5–15.5)
WBC: 5.4 10*3/uL (ref 4.0–10.5)
nRBC: 0 % (ref 0.0–0.2)

## 2023-07-22 LAB — FERRITIN: Ferritin: 39 ng/mL (ref 11–307)

## 2023-07-22 MED ORDER — IRON SUCROSE 200 MG IVPB - SIMPLE MED
200.0000 mg | Freq: Once | Status: DC
  Filled 2023-07-22: qty 110

## 2023-07-22 MED ORDER — SENNOSIDES-DOCUSATE SODIUM 8.6-50 MG PO TABS
2.0000 | ORAL_TABLET | Freq: Every day | ORAL | Status: DC
  Administered 2023-07-22: 2 via ORAL
  Filled 2023-07-22 (×4): qty 2

## 2023-07-22 MED ORDER — IRON SUCROSE 300 MG IVPB - SIMPLE MED
300.0000 mg | Status: DC
  Filled 2023-07-22: qty 265

## 2023-07-22 MED ORDER — SODIUM CHLORIDE 0.9 % IV SOLN
100.0000 mg | Freq: Once | INTRAVENOUS | Status: AC
  Administered 2023-07-22: 100 mg via INTRAVENOUS
  Filled 2023-07-22: qty 5

## 2023-07-22 MED ORDER — IRON SUCROSE 200 MG IVPB - SIMPLE MED
200.0000 mg | Status: DC
  Filled 2023-07-22: qty 110

## 2023-07-22 MED ORDER — GLYCERIN (LAXATIVE) 2 G RE SUPP
1.0000 | Freq: Once | RECTAL | Status: DC
  Filled 2023-07-22: qty 1

## 2023-07-22 MED ORDER — ACETAZOLAMIDE 250 MG PO TABS
250.0000 mg | ORAL_TABLET | Freq: Once | ORAL | Status: AC
  Administered 2023-07-22: 250 mg via ORAL
  Filled 2023-07-22: qty 1

## 2023-07-22 MED ORDER — SODIUM CHLORIDE 0.9 % IV SOLN
100.0000 mg | Freq: Once | INTRAVENOUS | Status: AC
  Administered 2023-07-22: 200 mg via INTRAVENOUS
  Filled 2023-07-22: qty 5

## 2023-07-22 NOTE — Progress Notes (Cosign Needed Addendum)
   Subjective:  Today, Ms. KAYTLYN DIN is feeling okay. She thinks that she is improving. She is surprised with how much she is urinating. She is willing to try anything to help with her anxiety. She says that she feels her extra weight as pressure in her upper abdomen. She says that she is ready to start getting her strength back, because she is feeling very weak.  Objective:  Vital signs in last 24 hours: Vitals:   07/21/23 2038 07/21/23 2259 07/22/23 0318 07/22/23 0754  BP:  (!) 161/42 (!) 116/38 122/68  Pulse:  81 78 96  Resp:  20 20 18   Temp:  97.8 F (36.6 C) 97.9 F (36.6 C) 98 F (36.7 C)  TempSrc:  Oral Oral Oral  SpO2: 96%   94%  Weight:   50.3 kg   Height:       Weight change: -1.342 kg  Intake/Output Summary (Last 24 hours) at 07/22/2023 1114 Last data filed at 07/22/2023 1000 Gross per 24 hour  Intake 477 ml  Output 1900 ml  Net -1423 ml   GEN: No acute distress, lying comfortably in bed, able to sit up for pulmonary exam without effort CV: Regular rate PULM: diminished breath sounds in bilateral bases, rhonchi on RUL, end expiratory wheezing on LUL. EXT: No lower extremity edema  Assessment/Plan:  Patient Summary This is hospital day 11 for Ms. [name], a 88 y.o. with a PMH of moderate-severe COPD without O2 requirement at home, CHF, heart block with pacemaker, HTN and TAVR who presented on 07/11/2023 from SNF with worsening shortness of breath and is admitted for acute on chronic hypoxic respiratory failure. Her barriers to discharge are bilateral pleural effusions responding to diuresis and severe anxiety secondary to air hunger.  Principal Problem   Respiratory failure with hypoxia and hypercapnia (HCC)   COPD exacerbation (HCC) Patient has increased oxygen requirement compared to last week. Continue to wean as tolerated. Should improve with further diuresis. - Triple therapy inhaler BID, ipratropium Q6H PRN - O2 as needed for 88-92%, wean down as  tolerated  Active Problems   Anxiety The patient denies a panic attack this morning. - Increase buspirone  to 15 mg TID - Trazodone  25 mg at bedtime for sleep    Volume overload   Left arm swelling   Heart failure with preserved ejection fraction (HCC) Diuresed yesterday with 40 mg furosemide  BID and the patient feels like her breathing is easier. Continue diuresis, as patient is still up 5 lbs from admission. Repeat POCUS today shows smaller R pleural effusion, moderate sized L effusion and diffuse B lines. - 40 mg IV furosemide  BID    Anemia Hgb 10.5 this morning, up from yesterday 9.8. Iron  panel within normal limits. No indication to recheck CBC on 4/24. Replete iron  IV. - 300 mg venofer  every day for 2 days - senna-docusate 2 tablets at bedtime    Hypertension Discontinuing amlodipine  because of low diastolic pressures. Depending on afternoon BPs will consider adding spironolactone  for HFpEF and BPs. - Discontinue amlodipine   Chronic stable problems   Protein calorie malnutrition (HCC) Continue protein shakes  VTE: enoxaprin 40 mg every day  Diet: Regular Code: DNR/DNI  Dispo: Anticipated discharge to Skilled nursing facility in 2 days pending bed availability. Patient is stable, but O2 requirement remains elevated and she has significant pleural effusions.    LOS: 11 days   Ferdinand Houseman, Medical Student 07/22/2023, 11:14 AM

## 2023-07-22 NOTE — Telephone Encounter (Signed)
 Patient Product/process development scientist completed.    The patient is insured through [employer]/ [employer]. Patient has Medicare and is not eligible for a copay card, but may be able to apply for patient assistance or Medicare RX Payment Plan (Patient Must reach out to their plan, if eligible for payment plan), if available.    Ran test claim for Jardiance 10 mg and the current 30 day co-pay is $47.00.   This test claim was processed through [facility]- copay amounts may vary at other pharmacies due to pharmacy/plan contracts, or as the patient moves through the different stages of their insurance plan.     Morgan Arab, CPHT Pharmacy Technician III Certified Patient Advocate [facility] Patient Advocate Team Direct Number: [phone]  Fax: [phone]

## 2023-07-22 NOTE — Progress Notes (Signed)
 Occupational Therapy Treatment Patient Details Name: [name] MRN: [id number] DOB: 02/24/1936 Today's Date: 07/22/2023   History of present illness Pt is a 88 y.o. female admitted 07/11/23 for respiratory failure with hypoxia and hypercapnia. Pt presented with SOB and increased WOB. Workup was notable for mild acidosis, borderline high CO2 on her VBG.  PMH of COPD on 2-3L O2 at baseline, COVID Jan 2025, TAVR, PPM, GIB, endometrial CA, HTN, HLD, CAD, mild pulmonary hypertension. Of note, recent hospitalization for COPD exacerbation which required a stay in the ICU.   OT comments  Pt making progress with functional goals. Pt initially demos some self limiting behavior due to anxiety about mobility but was able to redirected to participate with OT. Reviewed anxiety mgt techniques from pevious OT session with pt and her best friend. Pt's best friend very supportive and encouraging. Pt sat at EOB for LB dressing, stood for UB dressing, PST to [facility] for toileting tasks. Pt O2 SATs at rest 91-93% on 6L O2, dropping to 84-85% with activity. Pt able to recover to 88% with deep, pursed lip breathing. OT will continue to follow acutely to maximize level of function and safety       If plan is discharge home, recommend the following:  A little help with walking and/or transfers;A little help with bathing/dressing/bathroom;Assistance with cooking/housework;Help with stairs or ramp for entrance;Assist for transportation   Equipment Recommendations  None recommended by OT    Recommendations for Other Services      Precautions / Restrictions Precautions Precautions: Fall Recall of Precautions/Restrictions: Intact Precaution/Restrictions Comments: Watch SpO2 Restrictions Weight Bearing Restrictions Per Provider Order: No       Mobility Bed Mobility Overal bed mobility: Needs Assistance Bed Mobility: Supine to Sit, Sit to Supine     Supine to sit: Modified independent (Device/Increase time), HOB  elevated Sit to supine: Modified independent (Device/Increase time)        Transfers Overall transfer level: Needs assistance Equipment used: 1 person hand held assist Transfers: Sit to/from Stand, Bed to chair/wheelchair/BSC Sit to Stand: Min assist Stand pivot transfers: Min assist         General transfer comment: min A sit- stand, CGA step pivot to [facility]     Balance Overall balance assessment: Needs assistance Sitting-balance support: No upper extremity supported, Feet supported Sitting balance-Leahy Scale: Fair     Standing balance support: Bilateral upper extremity supported, Single extremity supported, During functional activity Standing balance-Leahy Scale: Poor                             ADL either performed or assessed with clinical judgement   ADL Overall ADL's : Needs assistance/impaired     Grooming: Wash/dry hands;Wash/dry face;Contact guard assist;Standing               Lower Body Dressing: Contact guard assist;Sitting/lateral leans Lower Body Dressing Details (indicate cue type and reason): donned socks sitting EOB Toilet Transfer: Minimal assistance;Contact guard assist;BSC/3in1;Cueing for safety   Toileting- Clothing Manipulation and Hygiene: Contact guard assist;Minimal assistance;Sit to/from stand       Functional mobility during ADLs: Minimal assistance;Contact guard assist;Cueing for safety General ADL Comments: reviewed anxiety mgt techniques from pevious OT session with pt and her best friend. Pt's best friend very supportive and encouraging    Extremity/Trunk Assessment Upper Extremity Assessment Upper Extremity Assessment: Generalized weakness   Lower Extremity Assessment Lower Extremity Assessment: Defer to PT evaluation   Cervical /  Trunk Assessment Cervical / Trunk Assessment: Normal    Vision Baseline Vision/History: 1 Wears glasses Ability to See in Adequate Light: 0 Adequate     Perception     Praxis      Communication Communication Communication: Impaired Factors Affecting Communication: Hearing impaired   Cognition Arousal: Alert Behavior During Therapy: WFL for tasks assessed/performed, Anxious Cognition: No apparent impairments             OT - Cognition Comments: Pt reports ongoing issues with anxiety affecting functional level.                 Following commands: Intact        Cueing   Cueing Techniques: Verbal cues  Exercises      Shoulder Instructions       General Comments      Pertinent Vitals/ Pain       Pain Assessment Pain Assessment: No/denies pain  Home Living Family/patient expects to be discharged to:: Private residence Living Arrangements: Alone Available Help at Discharge: Family;Neighbor;Available PRN/intermittently                                    Prior Functioning/Environment              Frequency  Min 2X/week        Progress Toward Goals  OT Goals(current goals can now be found in the care plan section)  Progress towards OT goals: Progressing toward goals     Plan      Co-evaluation                 AM-PAC OT "6 Clicks" Daily Activity     Outcome Measure   Help from another person eating meals?: None Help from another person taking care of personal grooming?: A Little Help from another person toileting, which includes using toliet, bedpan, or urinal?: A Little Help from another person bathing (including washing, rinsing, drying)?: A Little Help from another person to put on and taking off regular upper body clothing?: A Little Help from another person to put on and taking off regular lower body clothing?: A Little 6 Click Score: 19    End of Session Equipment Utilized During Treatment: Oxygen;Gait belt;Other (comment);Rolling walker (2 wheels) (BSC)  OT Visit Diagnosis: Muscle weakness (generalized) (M62.81);Other (comment);Other symptoms and signs involving cognitive function    Activity Tolerance Patient tolerated treatment well   Patient Left in bed;with call bell/phone within reach;with family/visitor present   Nurse Communication Mobility status        Time: 1001-1019 OT Time Calculation (min): 18 min  Charges: OT General Charges $OT Visit: 1 Visit OT Treatments $Self Care/Home Management : 8-22 mins   Alfred Ann 07/22/2023, 1:14 PM

## 2023-07-23 ENCOUNTER — Ambulatory Visit: Payer: Medicare HMO | Admitting: Family Medicine

## 2023-07-23 DIAGNOSIS — J9600 Acute respiratory failure, unspecified whether with hypoxia or hypercapnia: Secondary | ICD-10-CM | POA: Diagnosis not present

## 2023-07-23 DIAGNOSIS — I5033 Acute on chronic diastolic (congestive) heart failure: Secondary | ICD-10-CM | POA: Diagnosis not present

## 2023-07-23 DIAGNOSIS — J441 Chronic obstructive pulmonary disease with (acute) exacerbation: Secondary | ICD-10-CM | POA: Diagnosis not present

## 2023-07-23 LAB — COMPREHENSIVE METABOLIC PANEL WITH GFR
ALT: 22 U/L (ref 0–44)
AST: 28 U/L (ref 15–41)
Albumin: 2.5 g/dL — ABNORMAL LOW (ref 3.5–5.0)
Alkaline Phosphatase: 52 U/L (ref 38–126)
Anion gap: 9 (ref 5–15)
BUN: 18 mg/dL (ref 8–23)
CO2: 26 mmol/L (ref 22–32)
Calcium: 8.5 mg/dL — ABNORMAL LOW (ref 8.9–10.3)
Chloride: 102 mmol/L (ref 98–111)
Creatinine, Ser: 0.55 mg/dL (ref 0.44–1.00)
GFR, Estimated: >60 mL/min (ref >60)
Glucose, Bld: 115 mg/dL — ABNORMAL HIGH (ref 70–99)
Potassium: 3.4 mmol/L — ABNORMAL LOW (ref 3.5–5.1)
Sodium: 137 mmol/L (ref 135–145)
Total Bilirubin: 0.9 mg/dL (ref 0.0–1.2)
Total Protein: 4.7 g/dL — ABNORMAL LOW (ref 6.5–8.1)

## 2023-07-23 LAB — CBC
HCT: 31.9 % — ABNORMAL LOW (ref 36.0–46.0)
Hemoglobin: 10.1 g/dL — ABNORMAL LOW (ref 12.0–15.0)
MCH: 30.1 pg (ref 26.0–34.0)
MCHC: 31.7 g/dL (ref 30.0–36.0)
MCV: 94.9 fL (ref 80.0–100.0)
Platelets: 172 10*3/uL (ref 150–400)
RBC: 3.36 MIL/uL — ABNORMAL LOW (ref 3.87–5.11)
RDW: 15.4 % (ref 11.5–15.5)
WBC: 5.3 10*3/uL (ref 4.0–10.5)
nRBC: 0 % (ref 0.0–0.2)

## 2023-07-23 LAB — BLOOD GAS, VENOUS
Acid-Base Excess: 5 mmol/L — ABNORMAL HIGH (ref 0.0–2.0)
Acid-Base Excess: 2.8 mmol/L — ABNORMAL HIGH (ref 0.0–2.0)
Bicarbonate: 29.9 mmol/L — ABNORMAL HIGH (ref 20.0–28.0)
Bicarbonate: 31 mmol/L — ABNORMAL HIGH (ref 20.0–28.0)
Drawn by: 66579
Drawn by: 7754
O2 Saturation: 88.6 %
O2 Saturation: 35.4 %
Patient temperature: 36.7
Patient temperature: 36.4
pCO2, Ven: 43 mmHg — ABNORMAL LOW (ref 44–60)
pCO2, Ven: 61 mmHg — ABNORMAL HIGH (ref 44–60)
pH, Ven: 7.44 — ABNORMAL HIGH (ref 7.25–7.43)
pH, Ven: 7.31 (ref 7.25–7.43)
pO2, Ven: 52 mmHg — ABNORMAL HIGH (ref 32–45)
pO2, Ven: <31 mmHg — CL (ref 32–45)

## 2023-07-23 LAB — RENAL FUNCTION PANEL
Albumin: 3 g/dL — ABNORMAL LOW (ref 3.5–5.0)
Anion gap: 9 (ref 5–15)
BUN: 18 mg/dL (ref 8–23)
CO2: 27 mmol/L (ref 22–32)
Calcium: 8.8 mg/dL — ABNORMAL LOW (ref 8.9–10.3)
Chloride: 102 mmol/L (ref 98–111)
Creatinine, Ser: 0.75 mg/dL (ref 0.44–1.00)
GFR, Estimated: >60 mL/min (ref >60)
Glucose, Bld: 116 mg/dL — ABNORMAL HIGH (ref 70–99)
Phosphorus: 4.6 mg/dL (ref 2.5–4.6)
Potassium: 4 mmol/L (ref 3.5–5.1)
Sodium: 138 mmol/L (ref 135–145)

## 2023-07-23 LAB — MAGNESIUM: Magnesium: 2.1 mg/dL (ref 1.7–2.4)

## 2023-07-23 MED ORDER — FUROSEMIDE 10 MG/ML IJ SOLN
40.0000 mg | Freq: Two times a day (BID) | INTRAMUSCULAR | Status: DC
  Administered 2023-07-23: 40 mg via INTRAVENOUS
  Filled 2023-07-23: qty 4

## 2023-07-23 MED ORDER — POTASSIUM CHLORIDE CRYS ER 20 MEQ PO TBCR
40.0000 meq | EXTENDED_RELEASE_TABLET | Freq: Once | ORAL | Status: AC
  Administered 2023-07-23: 40 meq via ORAL
  Filled 2023-07-23: qty 2

## 2023-07-23 MED ORDER — POLYETHYLENE GLYCOL 3350 17 G PO PACK
17.0000 g | PACK | Freq: Every day | ORAL | Status: DC
  Administered 2023-07-23 – 2023-07-25 (×2): 17 g via ORAL
  Filled 2023-07-23 (×5): qty 1

## 2023-07-23 MED ORDER — SODIUM CHLORIDE 0.9 % IV SOLN
300.0000 mg | INTRAVENOUS | Status: AC
  Administered 2023-07-23: 300 mg via INTRAVENOUS
  Filled 2023-07-23: qty 15

## 2023-07-23 MED ORDER — SPIRONOLACTONE 12.5 MG HALF TABLET
12.5000 mg | ORAL_TABLET | Freq: Every day | ORAL | Status: DC
  Administered 2023-07-23 – 2023-07-24 (×2): 12.5 mg via ORAL
  Filled 2023-07-23 (×2): qty 1

## 2023-07-23 MED ORDER — ACETAZOLAMIDE 250 MG PO TABS
500.0000 mg | ORAL_TABLET | Freq: Once | ORAL | Status: AC
  Administered 2023-07-23: 500 mg via ORAL
  Filled 2023-07-23: qty 2

## 2023-07-23 NOTE — Progress Notes (Addendum)
 Subjective:  Today, [name] is feeling better than yesterday. She was pleased that she did not have a panic attack all day yesterday. She was able to stand up and take a few steps but "did not reach her goal". She is very motivated to make progress so she can go to her skilled nursing facility.  Her neighbor who was present revealed that she had been on a steady decline prior to her previous hospitalization.  Objective:  Vital signs in last 24 hours: Vitals:   07/22/23 1511 07/22/23 2009 07/22/23 2318 07/23/23 0250  BP: (!) 147/46 (!) 147/49 (!) 140/52 (!) 120/43  Pulse: 95 91 93 81  Resp: 18 20 20 20   Temp: 98.2 F (36.8 C) 98.2 F (36.8 C) 98.6 F (37 C)   TempSrc: Oral Oral Oral   SpO2: 92% 93% 91% 97%  Weight:      Height:       Weight change:   Intake/Output Summary (Last 24 hours) at 07/23/2023 1610 Last data filed at 07/22/2023 1858 Gross per 24 hour  Intake 1194 ml  Output 1100 ml  Net 94 ml   GEN: Frail, elderly woman, in no acute distress, sitting comfortably in bed CV: 1-2 cm JVD PULM: deep, hacking cough during examination, diminished breath sounds in the bases bilaterally EXT: minimal edema in bilateral lower extremities, L antecubital swelling present and unchanged from yesterday  Assessment/Plan:  Patient Summary This is hospital day 12 for [name], a 88 y.o. with a PMH of moderate-severe COPD without O2 requirement at home, CHF, heart block with pacemaker, HTN and TAVR who presented on 07/11/2023 from SNF with worsening shortness of breath and is admitted for acute on chronic hypoxic respiratory failure. Her barriers to discharge are bilateral pleural effusions responding to diuresis and severe anxiety with panic attacks.  Principal Problem   Respiratory failure with hypoxia and hypercapnia (HCC)   COPD exacerbation (HCC) Patient has increased oxygen requirement, at home before hospitalization was on no supplemental O2. Today on 3LNC  in room at goal SpO2. Continue to wean as tolerated. Should improve with further diuresis. - Triple therapy inhaler BID, ipratropium Q6H PRN - O2 as needed for 88-92%, wean down as tolerated  Active Problems   Anxiety Able to participate with PT/OT yesterday. She is making progress on buspirone  15 mg TID. Consider cross taper to sertraline which would have more room for higher baseline dose for persistent anxiety. - Buspirone  15 mg TID - Trazodone  25 mg at bedtime for sleep - Melatonin 3 mg at bedtime for sleep    Volume overload   Left arm swelling   Heart failure with preserved ejection fraction (HCC) Last night VBG showed metabolic alkalosis at pH 7.45, so she received 250 mg PO acetazolamide . VBG in AM pH 7.44. Suggestive of contraction alkalosis with component of hyperventilation due to anxiety. BMP shows CO2 of 26 down from 30 before acetazolamide . Patient is up 5 lbs from admission, so will continue to diurese with furosemide  and help alkalosis and further diurese with acetazolamide . - 40 mg IV furosemide  BID - 500 mg acetazolamide  every day    Anemia Patient had complaints of hard stool yesterday, but refused suppository. Adding miralax  to sennakot. - 300 mg venofer  every day for 2 days, day 2 of 2 4/24 - senna-docusate 2 tablets at bedtime - Miralax  17 g every day     Hypertension Holding home amlodipine . Start spironolactone  for HFpEF and BPs. - Start  12.5 mg spironolactone   Chronic stable problems   Protein calorie malnutrition (HCC) Continue protein shakes every day  VTE: enoxaprin 40 mg every day  Diet: Regular Code: DNR/DNI  Dispo: Anticipated discharge to Skilled nursing facility in 1-2 days pending diuresis of pleural effusions and anxiety management. Patient is stable, but O2 requirement remains elevated and she has significant pleural effusions.   LOS: 12 days   Ferdinand Houseman, Medical Student 07/23/2023, 6:21 AM

## 2023-07-23 NOTE — Progress Notes (Signed)
 Physical Therapy Treatment Patient Details Name: [name] MRN: [id number] DOB: December 28, 1935 Today's Date: 07/23/2023   History of Present Illness Pt is a 88 y.o. female admitted 07/11/23 for respiratory failure with hypoxia and hypercapnia. Pt presented with SOB and increased WOB. Workup was notable for mild acidosis, borderline high CO2 on her VBG.  PMH of COPD on 2-3L O2 at baseline, COVID Jan 2025, TAVR, PPM, GIB, endometrial CA, HTN, HLD, CAD, mild pulmonary hypertension. Of note, recent hospitalization for COPD exacerbation which required a stay in the ICU.    PT Comments  Pt resting in bed on arrival, pleasant and agreeable to session. Pt making slow but steady progress towards acute goals, however continues to be limited by decreased cardiopulmonary endurance. Pt maintaining SpO2 88-95% on 3L O2 throughout mobility with pt able to complete standing marching for increased endurance and standing punches without UE support for improved balance/postural reactions and maintain increased time in standing during session. Pt able to maintain standing ~10 mins before fatigue and needing to sit, pt declining gait trials due to fear of panic attack. Pt eager to progress gait and agreeable to gait trial next session. Pt continues to benefit from skilled PT services to progress toward functional mobility goals.      If plan is discharge home, recommend the following: A little help with walking and/or transfers;A lot of help with bathing/dressing/bathroom;Assist for transportation;Help with stairs or ramp for entrance;Assistance with cooking/housework   Can travel by private vehicle     No  Equipment Recommendations  Wheelchair (measurements PT);Wheelchair cushion (measurements PT)    Recommendations for Other Services       Precautions / Restrictions Precautions Precautions: Fall Recall of Precautions/Restrictions: Intact Precaution/Restrictions Comments: Watch SpO2 Restrictions Weight  Bearing Restrictions Per Provider Order: No     Mobility  Bed Mobility Overal bed mobility: Needs Assistance Bed Mobility: Supine to Sit, Sit to Supine     Supine to sit: Modified independent (Device/Increase time), HOB elevated Sit to supine: Modified independent (Device/Increase time)        Transfers Overall transfer level: Needs assistance Equipment used: Rolling walker (2 wheels) Transfers: Sit to/from Stand, Bed to chair/wheelchair/BSC Sit to Stand: Contact guard assist           General transfer comment: CGA for safety    Ambulation/Gait             Pre-gait activities: marching in place x60 seconds x3 bouts General Gait Details: pt declining attempts due to fear of panic attack   Stairs             Wheelchair Mobility     Tilt Bed    Modified Rankin (Stroke Patients Only)       Balance Overall balance assessment: Needs assistance Sitting-balance support: No upper extremity supported, Feet supported Sitting balance-Leahy Scale: Fair Sitting balance - Comments: in long sitting in bed   Standing balance support: Bilateral upper extremity supported, Single extremity supported, During functional activity Standing balance-Leahy Scale: Poor Standing balance comment: UE support required, able to static stand without UE support                            Communication Communication Communication: Impaired Factors Affecting Communication: Hearing impaired  Cognition Arousal: Alert Behavior During Therapy: WFL for tasks assessed/performed, Anxious   PT - Cognitive impairments: No apparent impairments  Following commands: Intact      Cueing Cueing Techniques: Verbal cues  Exercises Other Exercises Other Exercises: alternating UE standing punches x30 seconds without UE support Other Exercises: standing marching with UE support, x60 seconds x3 boust    General Comments General comments (skin  integrity, edema, etc.): SpO2 88-95% on 3L throughout session      Pertinent Vitals/Pain      Home Living                          Prior Function            PT Goals (current goals can now be found in the care plan section) Acute Rehab PT Goals Patient Stated Goal: Return Home PT Goal Formulation: With patient/family Time For Goal Achievement: 07/26/23 Progress towards PT goals: Progressing toward goals    Frequency    Min 2X/week      PT Plan      Co-evaluation              AM-PAC PT "6 Clicks" Mobility   Outcome Measure  Help needed turning from your back to your side while in a flat bed without using bedrails?: A Little Help needed moving from lying on your back to sitting on the side of a flat bed without using bedrails?: A Little Help needed moving to and from a bed to a chair (including a wheelchair)?: A Little Help needed standing up from a chair using your arms (e.g., wheelchair or bedside chair)?: A Little Help needed to walk in hospital room?: Total Help needed climbing 3-5 steps with a railing? : Total 6 Click Score: 14    End of Session Equipment Utilized During Treatment: Oxygen Activity Tolerance: Patient limited by fatigue;Patient tolerated treatment well Patient left: with call bell/phone within reach;in bed Nurse Communication: Mobility status PT Visit Diagnosis: Other abnormalities of gait and mobility (R26.89);Muscle weakness (generalized) (M62.81)     Time: 1040-1057 PT Time Calculation (min) (ACUTE ONLY): 17 min  Charges:    $Therapeutic Activity: 8-22 mins PT General Charges $$ ACUTE PT VISIT: 1 Visit                     Jordi Kamm R. PTA Acute Rehabilitation Services Office: [phone]   Agapito Horseman 07/23/2023, 12:07 PM

## 2023-07-24 DIAGNOSIS — I5033 Acute on chronic diastolic (congestive) heart failure: Secondary | ICD-10-CM | POA: Diagnosis not present

## 2023-07-24 DIAGNOSIS — J441 Chronic obstructive pulmonary disease with (acute) exacerbation: Secondary | ICD-10-CM | POA: Diagnosis not present

## 2023-07-24 DIAGNOSIS — J9600 Acute respiratory failure, unspecified whether with hypoxia or hypercapnia: Secondary | ICD-10-CM | POA: Diagnosis not present

## 2023-07-24 LAB — CBC
HCT: 31.8 % — ABNORMAL LOW (ref 36.0–46.0)
Hemoglobin: 10.1 g/dL — ABNORMAL LOW (ref 12.0–15.0)
MCH: 30.5 pg (ref 26.0–34.0)
MCHC: 31.8 g/dL (ref 30.0–36.0)
MCV: 96.1 fL (ref 80.0–100.0)
Platelets: 175 10*3/uL (ref 150–400)
RBC: 3.31 MIL/uL — ABNORMAL LOW (ref 3.87–5.11)
RDW: 15.5 % (ref 11.5–15.5)
WBC: 5.3 10*3/uL (ref 4.0–10.5)
nRBC: 0 % (ref 0.0–0.2)

## 2023-07-24 LAB — BASIC METABOLIC PANEL WITH GFR
Anion gap: 7 (ref 5–15)
BUN: 18 mg/dL (ref 8–23)
CO2: 24 mmol/L (ref 22–32)
Calcium: 8.8 mg/dL — ABNORMAL LOW (ref 8.9–10.3)
Chloride: 106 mmol/L (ref 98–111)
Creatinine, Ser: 0.61 mg/dL (ref 0.44–1.00)
GFR, Estimated: >60 mL/min (ref >60)
Glucose, Bld: 99 mg/dL (ref 70–99)
Potassium: 3.8 mmol/L (ref 3.5–5.1)
Sodium: 137 mmol/L (ref 135–145)

## 2023-07-24 LAB — MAGNESIUM: Magnesium: 2.1 mg/dL (ref 1.7–2.4)

## 2023-07-24 LAB — BRAIN NATRIURETIC PEPTIDE: B Natriuretic Peptide: 529.7 pg/mL — ABNORMAL HIGH (ref 0.0–100.0)

## 2023-07-24 MED ORDER — SPIRONOLACTONE 25 MG PO TABS
25.0000 mg | ORAL_TABLET | Freq: Every day | ORAL | Status: DC
  Administered 2023-07-25 – 2023-07-27 (×3): 25 mg via ORAL
  Filled 2023-07-24 (×3): qty 1

## 2023-07-24 MED ORDER — FUROSEMIDE 10 MG/ML IJ SOLN
60.0000 mg | Freq: Once | INTRAMUSCULAR | Status: AC
  Administered 2023-07-24: 60 mg via INTRAVENOUS
  Filled 2023-07-24: qty 6

## 2023-07-24 MED ORDER — SPIRONOLACTONE 12.5 MG HALF TABLET
12.5000 mg | ORAL_TABLET | Freq: Once | ORAL | Status: AC
  Administered 2023-07-24: 12.5 mg via ORAL
  Filled 2023-07-24: qty 1

## 2023-07-24 NOTE — Plan of Care (Signed)
  Problem: Education: Goal: Knowledge of General Education information will improve Description: Including pain rating scale, medication(s)/side effects and non-pharmacologic comfort measures Outcome: Progressing   Problem: Clinical Measurements: Goal: Will remain free from infection Outcome: Progressing   Problem: Elimination: Goal: Will not experience complications related to bowel motility Outcome: Progressing Goal: Will not experience complications related to urinary retention Outcome: Progressing   Problem: Pain Managment: Goal: General experience of comfort will improve and/or be controlled Outcome: Progressing   Problem: Clinical Measurements: Goal: Respiratory complications will improve Outcome: Not Progressing   Problem: Activity: Goal: Risk for activity intolerance will decrease Outcome: Not Progressing   Problem: Coping: Goal: Level of anxiety will decrease Outcome: Not Progressing

## 2023-07-24 NOTE — Progress Notes (Signed)
 Nurse requested Mobility Specialist to perform oxygen saturation test with pt which includes removing pt from oxygen both at rest and while ambulating.  Below are the results from that testing.     Patient Saturations on Room Air at Rest = spO2 93%  Patient Saturations on Room Air while Ambulating = sp02 87% .  Rested and performed pursed lip breathing for 1 minute with sp02 at 87%.  Patient Saturations on 1 Liters of oxygen while Ambulating = sp02 91%  At end of testing pt left in room on 1 Liters of oxygen, SpO2 95% at rest.  Reported results to nurse.   Jalen Daluz Mobility Specialist Please contact via Special educational needs teacher or  Rehab office at [phone]

## 2023-07-24 NOTE — Progress Notes (Signed)
 Mobility Specialist Progress Note:    07/24/23 0948  Mobility  Activity Ambulated with assistance in room;Transferred from bed to chair  Level of Assistance Standby assist, set-up cues, supervision of patient - no hands on  Assistive Device Front wheel walker  Distance Ambulated (ft) 15 ft  Activity Response Tolerated well  Mobility Referral Yes  Mobility visit 1 Mobility  Mobility Specialist Start Time (ACUTE ONLY) 0930  Mobility Specialist Stop Time (ACUTE ONLY) 0948  Mobility Specialist Time Calculation (min) (ACUTE ONLY) 18 min   Pt received in bed, required max encouragement to participate in mobility session for ambulatory O2 test. Pt premedicated for anxiety prior to session. SV required with RW to ambulate around bed to chair. Tolerated session well on RA, SpO2 91%, however  upon arriving to chair, pt reported feeling anxious again, SpO2 87% on RA. Pt sitting comfortably in chair practicing pursed lip breathing. Increased O2 flow, SpO2 91% on 1L. After several minutes, pt's stated they felt less anxious, SpO2 95% on 1L. RN notified. Left pt with all needs met, MD in room. Pt not eager for d/c.   Lionel Woodberry Mobility Specialist Please contact via [employer] or  Rehab office at [phone]

## 2023-07-24 NOTE — Progress Notes (Signed)
 Subjective:  Today, [name] is feeling well. She endorses a panic attack this morning when she got up and walked, but she is happy that she did walk. She says that her sputum is much easier to cough up than it previously was. She thinks that this will help with her anxiety moving forward.   Objective:  Vital signs in last 24 hours: Vitals:   07/24/23 0449 07/24/23 0700 07/24/23 0729 07/24/23 0841  BP:   (!) 133/42   Pulse: 81 88 89   Resp:   18 18  Temp:   98.3 F (36.8 C)   TempSrc:   Oral   SpO2: 94% 93% 93%   Weight: 49.2 kg     Height:       Weight change:   Intake/Output Summary (Last 24 hours) at 07/24/2023 1111 Last data filed at 07/23/2023 1800 Gross per 24 hour  Intake 577 ml  Output 1700 ml  Net -1123 ml   GEN: Frail elderly woman, no acute distress, sitting comfortably in the chair, [place] in place, able to move easily for pulmonary exam CV: regular rate PULM: diminished breath sounds at the bases bilaterally, mild wheezing POCUS: Moderate bilateral pulmonary effusions. R effusion with notable atelactatic lung. L effusion larger than right. IVC dilated but collapsible.  Assessment/Plan:  Patient Summary This is hospital day 13 for [name], a 88 y.o. with a PMH of moderate-severe COPD without O2 requirement at home, CHF, heart block with pacemaker, HTN and TAVR who presented on 07/11/2023 from SNF with worsening shortness of breath and is admitted for acute on chronic hypoxic respiratory failure. Her barriers to discharge are bilateral pleural effusions responding to diuresis and severe anxiety with panic attacks.  Principal Problem   Respiratory failure with hypoxia and hypercapnia (HCC)   COPD exacerbation (HCC) Today on 1 LNC in room at goal SpO2. Continue to wean as tolerated. She was able to tolerate RA for some time this morning and maintain goal SpO2. BNP down to 529 this morning. Patient endorses subjective improvement in breathing. -  Triple therapy inhaler BID, ipratropium Q6H PRN - O2 as needed for 88-92%, wean down as tolerated  Active Problems   Anxiety Was able to stand and participate with PT yesterday. Goal today is to walk some with assistance of PT. Able to walk around the bed today, but did have some anxiety. She is making progress on buspirone  15 mg TID. - Buspirone  15 mg TID - Trazodone  25 mg at bedtime for sleep - Melatonin 3 mg at bedtime for sleep    Volume overload   Left arm swelling   Heart failure with preserved ejection fraction (HCC) VBG yesterday PM after furosemide  and acetazolamide  showed pH 7.31, and CMP this AM with CO2 of 24. Hold acetazolamide  today. Continue to diurese, patient is up 3-5 lbs from admission weight. - 60 mg IV furosemide  in AM    Anemia Hgb stable today at 10.1.    Constipation Patient had 3 bowel movements yesterday. Continue bowel regimen. - senna-docusate 2 tablets at bedtime - Miralax  17 g every day    Hypertension Blood pressures stable yesterday on spironolactone  125. Increase dose of spironolactone  for HFpEF and BPs. - 25 mg spironolactone   Chronic stable problems   Protein calorie malnutrition (HCC) Continue protein shakes every day  VTE: enoxaprin 40 mg every day  Diet: Regular Code: DNR/DNI  Dispo: Anticipated discharge to Skilled nursing facility in 1-2 days pending diuresis of  pleural effusions and anxiety management. Patient is stable, but O2 requirement remains elevated and she has significant pleural effusions.   LOS: 13 days   Ferdinand Houseman, Medical Student 07/24/2023, 11:11 AM

## 2023-07-25 DIAGNOSIS — J441 Chronic obstructive pulmonary disease with (acute) exacerbation: Secondary | ICD-10-CM | POA: Diagnosis not present

## 2023-07-25 DIAGNOSIS — I5033 Acute on chronic diastolic (congestive) heart failure: Secondary | ICD-10-CM | POA: Diagnosis not present

## 2023-07-25 DIAGNOSIS — J9600 Acute respiratory failure, unspecified whether with hypoxia or hypercapnia: Secondary | ICD-10-CM | POA: Diagnosis not present

## 2023-07-25 LAB — CBC
HCT: 34.2 % — ABNORMAL LOW (ref 36.0–46.0)
Hemoglobin: 10.8 g/dL — ABNORMAL LOW (ref 12.0–15.0)
MCH: 29.6 pg (ref 26.0–34.0)
MCHC: 31.6 g/dL (ref 30.0–36.0)
MCV: 93.7 fL (ref 80.0–100.0)
Platelets: 186 10*3/uL (ref 150–400)
RBC: 3.65 MIL/uL — ABNORMAL LOW (ref 3.87–5.11)
RDW: 15.6 % — ABNORMAL HIGH (ref 11.5–15.5)
WBC: 5.8 10*3/uL (ref 4.0–10.5)
nRBC: 0 % (ref 0.0–0.2)

## 2023-07-25 LAB — BASIC METABOLIC PANEL WITH GFR
Anion gap: 9 (ref 5–15)
BUN: 28 mg/dL — ABNORMAL HIGH (ref 8–23)
CO2: 26 mmol/L (ref 22–32)
Calcium: 9 mg/dL (ref 8.9–10.3)
Chloride: 104 mmol/L (ref 98–111)
Creatinine, Ser: 0.76 mg/dL (ref 0.44–1.00)
GFR, Estimated: >60 mL/min (ref >60)
Glucose, Bld: 149 mg/dL — ABNORMAL HIGH (ref 70–99)
Potassium: 3.4 mmol/L — ABNORMAL LOW (ref 3.5–5.1)
Sodium: 139 mmol/L (ref 135–145)

## 2023-07-25 LAB — MAGNESIUM: Magnesium: 2 mg/dL (ref 1.7–2.4)

## 2023-07-25 LAB — GLUCOSE, CAPILLARY: Glucose-Capillary: 147 mg/dL — ABNORMAL HIGH (ref 70–99)

## 2023-07-25 MED ORDER — POTASSIUM CHLORIDE CRYS ER 20 MEQ PO TBCR
40.0000 meq | EXTENDED_RELEASE_TABLET | Freq: Two times a day (BID) | ORAL | Status: AC
  Administered 2023-07-25 (×2): 40 meq via ORAL
  Filled 2023-07-25 (×2): qty 2

## 2023-07-25 MED ORDER — FUROSEMIDE 40 MG PO TABS
40.0000 mg | ORAL_TABLET | Freq: Every day | ORAL | Status: DC
  Administered 2023-07-25 – 2023-07-27 (×3): 40 mg via ORAL
  Filled 2023-07-25 (×3): qty 1

## 2023-07-25 NOTE — Progress Notes (Signed)
 Mobility Specialist Progress Note:   07/25/23 1448  Mobility  Activity Ambulated with assistance in hallway  Level of Assistance Standby assist, set-up cues, supervision of patient - no hands on  Assistive Device Front wheel walker  Distance Ambulated (ft) 135 ft  Activity Response Tolerated well  Mobility Referral Yes  Mobility visit 1 Mobility  Mobility Specialist Start Time (ACUTE ONLY) 1402  Mobility Specialist Stop Time (ACUTE ONLY) 1415  Mobility Specialist Time Calculation (min) (ACUTE ONLY) 13 min   Pt received in chair, eager to mobility. VSS on 2 L. Pt c/o slight leg weakness but stated she feels stronger compared to this morning. Pt returned to chair with call bell in reach and all needs met.   Sofia Dunn  Mobility Specialist Please contact via [employer] office at [phone]

## 2023-07-25 NOTE — Progress Notes (Addendum)
   Subjective:  Today, [name] is feeling better. She thinks that she would be able to potentially go home by Monday. She is looking forward to walking with her nurse.  Objective:  Vital signs in last 24 hours: Vitals:   07/24/23 1924 07/24/23 2331 07/25/23 0329 07/25/23 0357  BP: (!) 136/45  (!) 127/42   Pulse: 90  94   Resp: 16 16 16    Temp: 98 F (36.7 C) 97.8 F (36.6 C) 97.6 F (36.4 C)   TempSrc: Oral Oral Oral   SpO2: 96%  96%   Weight:    46.3 kg  Height:       Weight change: -2.933 kg  Intake/Output Summary (Last 24 hours) at 07/25/2023 0650 Last data filed at 07/25/2023 0641 Gross per 24 hour  Intake 940 ml  Output 2400 ml  Net -1460 ml   GEN: Frail elderly woman, no acute distress, lying comfortably in bed CV: regular rate PULM: Diminished lung sounds in the bilateral bases, with crackles, upper lung fields with mild diffuse end expiratory wheezing EXT: Trace lower extremity edema   Assessment/Plan:  Patient Summary This is hospital day 14 for [name], a 88 y.o. with a PMH of moderate-severe COPD without O2 requirement at home, CHF, heart block with pacemaker, HTN and TAVR who presented on 07/11/2023 from SNF with worsening shortness of breath and is admitted for acute on chronic hypoxic respiratory failure. Her barriers to discharge are bilateral pleural effusions responding to diuresis and severe anxiety with panic attacks.  Principal Problem   Respiratory failure with hypoxia and hypercapnia (HCC)   COPD exacerbation (HCC) Today on 1 LNC in room at goal SpO2. Continue to wean as tolerated. Patient endorses subjective improvement in breathing. - Triple therapy inhaler BID, ipratropium Q6H PRN - O2 as needed for 88-92%, wean down as tolerated    Volume overload   Left arm swelling   Heart failure with preserved ejection fraction [facility]) Patient had 2.4 L UOP in the last 24 hrs with IV lasix  60 mg BID. CMP this AM with CO2 of 26. Hold  acetazolamide . Trial of oral diuretics today. Was on 20 furosemide  outpatient. - 40 mg PO furosemide  daily - Daily BMP, Mg  Active Problems   Anxiety Continue current anxiety regimen, movement tolerance improving. - Buspirone  15 mg TID - Trazodone  25 mg at bedtime for sleep - Melatonin 3 mg at bedtime for sleep    Anemia Hgb stable today at 10.8, up from 10.1.    Constipation Patient continues to have regular bowel movements. - senna-docusate 2 tablets at bedtime - Miralax  17 g every day    Hypertension Blood pressures stable yesterday on spironolactone  25. - 25 mg spironolactone   Chronic stable problems   Protein calorie malnutrition (HCC) Continue protein shakes every day  VTE: enoxaprin 40 mg every day  Diet: Regular Code: DNR/DNI  Dispo: Anticipated discharge to Skilled nursing facility in 2 days pending diuresis of pleural effusions and anxiety management. Patient is stable, but O2 requirement remains elevated and she has significant pleural effusions.   LOS: 14 days   Ferdinand Houseman, Medical Student 07/25/2023, 6:50 AM  Attestation for Student Documentation:  I personally was present and performed or re-performed the history, physical exam and medical decision-making activities of this service and have verified that the service and findings are accurately documented in the student's note.  Dottie Vaquerano N, DO 07/25/2023, 9:39 AM

## 2023-07-25 NOTE — Progress Notes (Signed)
 Mobility Specialist Progress Note:   07/25/23 1139  Mobility  Activity Ambulated with assistance in hallway  Level of Assistance Standby assist, set-up cues, supervision of patient - no hands on  Assistive Device Front wheel walker  Distance Ambulated (ft) 100 ft  Activity Response Tolerated well  Mobility Referral Yes  Mobility visit 1 Mobility  Mobility Specialist Start Time (ACUTE ONLY) 1028  Mobility Specialist Stop Time (ACUTE ONLY) 1040  Mobility Specialist Time Calculation (min) (ACUTE ONLY) 12 min   During Mobility:  87-95% SpO2 2 L  Pt received in bed, agreeable to mobility. No physical assist required during ambulation. Pt c/o slight leg weakness, otherwise asx throughout. VSS on 2 L. Pt returned to bed with call bell in reach and all needs met.   [name]  Mobility Specialist Please contact via [employer] office at [phone]

## 2023-07-25 NOTE — TOC Progression Note (Signed)
 Transition of Care [facility]) - Progression Note    Patient Details  Name: [name] MRN: [id number] Date of Birth: April 20, 1935  Transition of Care [facility]) CM/SW Contact  Jarrell Merritts, LCSW Phone Number: 07/25/2023, 9:37 AM  Clinical Narrative:     CSW was alerted that pt was close to being medically ready for discharge therefore auth could be initiated.CSW spoke with Peggi Bowels from [employer] and started pt's authorization for SNF and PTAR. CSW was notified that pt's Dortha Gauss was still active and ne SNF auth started.  TOC team will continue to assist with discharge planning needs.   Expected Discharge Plan: Skilled Nursing Facility Barriers to Discharge: Insurance Authorization  Expected Discharge Plan and Services In-house Referral: Clinical Social Work     Living arrangements for the past 2 months: Single Family Home                                       Social Determinants of Health (SDOH) Interventions SDOH Screenings   Food Insecurity: No Food Insecurity (07/13/2023)  Housing: Low Risk  (07/13/2023)  Transportation Needs: No Transportation Needs (07/13/2023)  Utilities: Not At Risk (07/13/2023)  Alcohol  Screen: Low Risk  (08/05/2022)  Depression (PHQ2-9): Low Risk  (01/22/2023)  Financial Resource Strain: Low Risk  (08/05/2022)  Physical Activity: Inactive (08/05/2022)  Social Connections: Socially Isolated (07/16/2023)  Stress: No Stress Concern Present (08/05/2022)  Tobacco Use: High Risk (06/23/2023)    Readmission Risk Interventions     No data to display

## 2023-07-26 DIAGNOSIS — J441 Chronic obstructive pulmonary disease with (acute) exacerbation: Secondary | ICD-10-CM | POA: Diagnosis not present

## 2023-07-26 DIAGNOSIS — J9600 Acute respiratory failure, unspecified whether with hypoxia or hypercapnia: Secondary | ICD-10-CM | POA: Diagnosis not present

## 2023-07-26 DIAGNOSIS — I5033 Acute on chronic diastolic (congestive) heart failure: Secondary | ICD-10-CM | POA: Diagnosis not present

## 2023-07-26 DIAGNOSIS — Z515 Encounter for palliative care: Secondary | ICD-10-CM | POA: Diagnosis not present

## 2023-07-26 LAB — BASIC METABOLIC PANEL WITH GFR
Anion gap: 7 (ref 5–15)
BUN: 29 mg/dL — ABNORMAL HIGH (ref 8–23)
CO2: 25 mmol/L (ref 22–32)
Calcium: 8.8 mg/dL — ABNORMAL LOW (ref 8.9–10.3)
Chloride: 105 mmol/L (ref 98–111)
Creatinine, Ser: 0.71 mg/dL (ref 0.44–1.00)
GFR, Estimated: >60 mL/min (ref >60)
Glucose, Bld: 108 mg/dL — ABNORMAL HIGH (ref 70–99)
Potassium: 4.5 mmol/L (ref 3.5–5.1)
Sodium: 137 mmol/L (ref 135–145)

## 2023-07-26 LAB — MAGNESIUM: Magnesium: 2.2 mg/dL (ref 1.7–2.4)

## 2023-07-26 NOTE — Progress Notes (Signed)
   HD#15 SUBJECTIVE:  Patient Summary: [name] is a 88 y.o. with a pertinent PMH of COPD, CHF, heart block w/ pacemaker, HTN, and TAVR, who presented from SNF with shortness of breath and admitted for acute on chronic hypoxic respiratory failure.   Overnight Events: No events overnight  Interim History: Patient states that she did not sleep well last night. She thinks her breathing is doing better and she was able to walk some yesterday. She still gets anxious without wearing supplemental O2.   OBJECTIVE:  Vital Signs: Vitals:   07/26/23 0507 07/26/23 0828 07/26/23 0851 07/26/23 0853  BP:  (!) 121/48    Pulse:  79    Resp:  20    Temp:  98 F (36.7 C)    TempSrc:  Oral    SpO2:  93% 92% 92%  Weight: 46.6 kg     Height:       Supplemental O2: Nasal Cannula SpO2: 92 % O2 Flow Rate (L/min): 2 L/min FiO2 (%): 50 %  Filed Weights   07/24/23 0449 07/25/23 0357 07/26/23 0507  Weight: 49.2 kg 46.3 kg 46.6 kg     Intake/Output Summary (Last 24 hours) at 07/26/2023 0935 Last data filed at 07/25/2023 1724 Gross per 24 hour  Intake --  Output 400 ml  Net -400 ml   Net IO Since Admission: -10,138.71 mL [07/26/23 0935]  Physical Exam: General: Pleasant, chronically-ill appearing female laying in bed. No acute distress. CV: Regular rate. No LE edema. Mildly elevated JVD Pulmonary: Crackles in left lower lung field. Normal work of breathing  Skin: Warm and dry.  Psych: Anxious mood    ASSESSMENT/PLAN:  Assessment: Principal Problem:   Respiratory failure with hypoxia and hypercapnia (HCC) Active Problems:   Essential hypertension   COPD exacerbation (HCC)   Protein calorie malnutrition (HCC)   Volume overload   Left arm swelling   Anxiety   Advance care planning   Heart failure with preserved ejection fraction (HCC)   Anemia   Acute respiratory failure (HCC)   Plan: Respiratory failure with hypoxia and hypercapnia  HFpEF exacerbation COPD exacerbation,  resolved Patient was satting at 95% on 2 L [place] this morning, although [place] was not properly positioned. I suspect she does not need supplemental O2 at rest, but rather, only needs it with exertion.  - Triple therapy inhalers - Atrovent  q6h PRN  - Lasix  PO 40 mg daily  - Spironolactone  25 mg daily  - Strict I&Os, daily weights - Daily BMP, Mg  Anxiety - Continue buspar  15 mg TID - Continue trazodone  25 mg at bedtime - Continue melatonin at bedtime  - Continue celexa  20 mg daily    Best Practice: Diet: Regular diet IVF: Fluids: none VTE: enoxaparin  (LOVENOX ) injection 40 mg Start: 07/11/23 0900 Code: DNR/DNI Therapy Recs: SNF DISPO: Anticipated discharge in 1-2 days to Skilled nursing facility pending  placement and medical stability .  Signature: Chee Dimon, D.O.  Internal Medicine Resident, PGY-3 [facility]  Pager: [phone] 9:35 AM, 07/26/2023   Please contact the on call pager after 5 pm and on weekends at [phone].

## 2023-07-26 NOTE — Progress Notes (Signed)
 Patient complaints about her room being at the end of the hall and she cannot see people moving which makes her nervous  requested to change the room , charge nurse notified, pt transferred from 4E 26  to 4E 23

## 2023-07-26 NOTE — Progress Notes (Signed)
 Mobility Specialist Progress Note:   07/26/23 1214  Mobility  Activity Ambulated with assistance in hallway  Level of Assistance Standby assist, set-up cues, supervision of patient - no hands on  Assistive Device Front wheel walker  Distance Ambulated (ft) 200 ft  Activity Response Tolerated well  Mobility Referral Yes  Mobility visit 1 Mobility  Mobility Specialist Start Time (ACUTE ONLY) 1030  Mobility Specialist Stop Time (ACUTE ONLY) 1042  Mobility Specialist Time Calculation (min) (ACUTE ONLY) 12 min   Pre Mobility: 92% SpO2 1 L During Mobility: 82-93% SpO2 1 - 2 L Post Mobility:  92% SpO2 1 L  Pt received in bed, agreeable to mobility. Desat to 82% during ambulation on 1 L. Pt c/o SOB needing standing rest break. Pursed lip breathing encouraged. Pt requiring 2 L to keep sats stable during session. Pt was able to tolerate increased gait distance this session, able to ambulate to nursing station and back without fault. Pt left in chair with call bell in reach and all needs met.   [name]  Mobility Specialist Please contact via [employer] office at [phone]

## 2023-07-26 NOTE — Progress Notes (Addendum)
   Palliative Medicine Inpatient Follow Up Note HPI: [name] is a 88 y.o. with severe COPD and history of HFpEF who presents with dyspnea and wheezing is admitted for acute on chronic hypoxic and hypercapnic respiratory failure.   The Palliative care team has been asked to get involved for further foals of care conversations.  Today's Discussion 07/26/2023  *Please note that this is a verbal dictation therefore any spelling or grammatical errors are due to the "Dragon Medical One" system interpretation.  Chart reviewed inclusive of vital signs, progress notes, laboratory results, and diagnostic images.   I spoke to [facility] this morning, she shares that she has remained in the hospital this week as she was not "ready to leave" quite yet in the setting of her respiratory failure. She notes great improvement over the last week and is now able to walk multiple times a day with minimal burden. She shares at rest she does not need O2 though when mobilizing she does.   From the perspective of [name]'s anxiety she notes it is still present but has improved greatly. She shares it flairs up when she feels like she "can't breath".   We reviewed the plan for skilled nursing placement once medically stable.   Questions and concerns addressed/Palliative Support Provided.   Objective Assessment: Vital Signs Vitals:   07/26/23 0853 07/26/23 1107  BP:  (!) 128/40  Pulse:  83  Resp:  18  Temp:  97.8 F (36.6 C)  SpO2: 92% 95%    Intake/Output Summary (Last 24 hours) at 07/26/2023 1131 Last data filed at 07/25/2023 1724 Gross per 24 hour  Intake --  Output 400 ml  Net -400 ml   Last Weight  Most recent update: 07/26/2023  5:07 AM    Weight  46.6 kg (102 lb 12.8 oz)            Gen: Chronically ill-appearing elderly Caucasian female HEENT: moist mucous membranes CV: Regular rate and rhythm  PULM: On 2L breathing is even and unlabored ABD: soft/nontender  EXT: No edema  Neuro: Alert  and oriented x3   SUMMARY OF RECOMMENDATIONS   DNAR/DNI   DNR/MOST form completed and placed on the paper Chart    Patient is open to OP Palliative support on discharge   Patients goal is to be functional without significant SOB   PMT support as needed  Time: 25 ______________________________________________________________________________________ Camille [facility] [place] Palliative Medicine Team Team Cell Phone: [phone] Please utilize secure chat with additional questions, if there is no response within 30 minutes please call the above phone number  Palliative Medicine Team providers are available by phone from 7am to 7pm daily and can be reached through the team cell phone.  Should this patient require assistance outside of these hours, please call the patient's attending physician.

## 2023-07-27 DIAGNOSIS — I5030 Unspecified diastolic (congestive) heart failure: Secondary | ICD-10-CM | POA: Diagnosis not present

## 2023-07-27 DIAGNOSIS — F419 Anxiety disorder, unspecified: Secondary | ICD-10-CM | POA: Diagnosis not present

## 2023-07-27 DIAGNOSIS — J449 Chronic obstructive pulmonary disease, unspecified: Secondary | ICD-10-CM | POA: Diagnosis not present

## 2023-07-27 DIAGNOSIS — M6281 Muscle weakness (generalized): Secondary | ICD-10-CM | POA: Diagnosis not present

## 2023-07-27 DIAGNOSIS — R1311 Dysphagia, oral phase: Secondary | ICD-10-CM | POA: Diagnosis not present

## 2023-07-27 DIAGNOSIS — R531 Weakness: Secondary | ICD-10-CM | POA: Diagnosis not present

## 2023-07-27 DIAGNOSIS — R1319 Other dysphagia: Secondary | ICD-10-CM | POA: Diagnosis not present

## 2023-07-27 DIAGNOSIS — J9601 Acute respiratory failure with hypoxia: Secondary | ICD-10-CM | POA: Diagnosis not present

## 2023-07-27 DIAGNOSIS — Z7401 Bed confinement status: Secondary | ICD-10-CM | POA: Diagnosis not present

## 2023-07-27 DIAGNOSIS — Z515 Encounter for palliative care: Secondary | ICD-10-CM | POA: Diagnosis not present

## 2023-07-27 DIAGNOSIS — I959 Hypotension, unspecified: Secondary | ICD-10-CM | POA: Diagnosis not present

## 2023-07-27 DIAGNOSIS — J9600 Acute respiratory failure, unspecified whether with hypoxia or hypercapnia: Secondary | ICD-10-CM | POA: Diagnosis not present

## 2023-07-27 DIAGNOSIS — J206 Acute bronchitis due to rhinovirus: Secondary | ICD-10-CM | POA: Diagnosis not present

## 2023-07-27 DIAGNOSIS — I5033 Acute on chronic diastolic (congestive) heart failure: Secondary | ICD-10-CM | POA: Diagnosis not present

## 2023-07-27 DIAGNOSIS — N189 Chronic kidney disease, unspecified: Secondary | ICD-10-CM | POA: Diagnosis not present

## 2023-07-27 DIAGNOSIS — R2681 Unsteadiness on feet: Secondary | ICD-10-CM | POA: Diagnosis not present

## 2023-07-27 DIAGNOSIS — J9621 Acute and chronic respiratory failure with hypoxia: Secondary | ICD-10-CM | POA: Diagnosis not present

## 2023-07-27 DIAGNOSIS — D631 Anemia in chronic kidney disease: Secondary | ICD-10-CM | POA: Diagnosis not present

## 2023-07-27 DIAGNOSIS — E8770 Fluid overload, unspecified: Secondary | ICD-10-CM | POA: Diagnosis not present

## 2023-07-27 DIAGNOSIS — I130 Hypertensive heart and chronic kidney disease with heart failure and stage 1 through stage 4 chronic kidney disease, or unspecified chronic kidney disease: Secondary | ICD-10-CM | POA: Diagnosis not present

## 2023-07-27 DIAGNOSIS — N182 Chronic kidney disease, stage 2 (mild): Secondary | ICD-10-CM | POA: Diagnosis not present

## 2023-07-27 DIAGNOSIS — F411 Generalized anxiety disorder: Secondary | ICD-10-CM | POA: Diagnosis not present

## 2023-07-27 DIAGNOSIS — J441 Chronic obstructive pulmonary disease with (acute) exacerbation: Secondary | ICD-10-CM | POA: Diagnosis not present

## 2023-07-27 DIAGNOSIS — R2689 Other abnormalities of gait and mobility: Secondary | ICD-10-CM | POA: Diagnosis not present

## 2023-07-27 LAB — MAGNESIUM: Magnesium: 2.1 mg/dL (ref 1.7–2.4)

## 2023-07-27 LAB — BASIC METABOLIC PANEL WITH GFR
Anion gap: 8 (ref 5–15)
BUN: 25 mg/dL — ABNORMAL HIGH (ref 8–23)
CO2: 23 mmol/L (ref 22–32)
Calcium: 8.5 mg/dL — ABNORMAL LOW (ref 8.9–10.3)
Chloride: 106 mmol/L (ref 98–111)
Creatinine, Ser: 0.64 mg/dL (ref 0.44–1.00)
GFR, Estimated: >60 mL/min (ref >60)
Glucose, Bld: 115 mg/dL — ABNORMAL HIGH (ref 70–99)
Potassium: 4.3 mmol/L (ref 3.5–5.1)
Sodium: 137 mmol/L (ref 135–145)

## 2023-07-27 MED ORDER — BUSPIRONE HCL 15 MG PO TABS: 15.0000 mg | ORAL_TABLET | Freq: Three times a day (TID) | ORAL | Status: DC

## 2023-07-27 MED ORDER — MELATONIN 3 MG PO TABS: 3.0000 mg | ORAL_TABLET | Freq: Every evening | ORAL | Status: DC | PRN

## 2023-07-27 MED ORDER — TRAZODONE HCL 50 MG PO TABS: 25.0000 mg | ORAL_TABLET | Freq: Every day | ORAL | Status: DC

## 2023-07-27 MED ORDER — FUROSEMIDE 40 MG PO TABS: 40.0000 mg | ORAL_TABLET | Freq: Every day | ORAL | Status: DC

## 2023-07-27 MED ORDER — POLYETHYLENE GLYCOL 3350 17 G PO PACK: 17.0000 g | PACK | Freq: Every day | ORAL | Status: DC | PRN

## 2023-07-27 MED ORDER — SPIRONOLACTONE 25 MG PO TABS: 25.0000 mg | ORAL_TABLET | Freq: Every day | ORAL | Status: DC

## 2023-07-27 MED ORDER — IPRATROPIUM BROMIDE 0.02 % IN SOLN: 0.5000 mg | Freq: Four times a day (QID) | RESPIRATORY_TRACT | Status: DC | PRN

## 2023-07-27 MED ORDER — BUDESON-GLYCOPYRROL-FORMOTEROL 160-9-4.8 MCG/ACT IN AERO: 2.0000 | INHALATION_SPRAY | Freq: Two times a day (BID) | RESPIRATORY_TRACT | Status: DC

## 2023-07-27 NOTE — TOC Transition Note (Signed)
 Transition of Care [facility]) - Discharge Note   Patient Details  Name: [name] MRN: [id number] Date of Birth: 1935/08/01  Transition of Care [facility]) CM/SW Contact:  Valery Gaucher, LCSW Phone Number: 07/27/2023, 12:08 PM   Clinical Narrative:      Patient will Discharge to: [facility]  Discharge Date: 07/27/23 Family Notified: niece  Transport WG:NFAO  Per MD patient is ready for discharge. RN, patient, and facility notified of discharge. Discharge Summary sent to facility. RN given number for [phone]. Ambulance transport requested for patient.   Clinical Social Worker signing off.  Liddie Reel, MSW, LCSW Clinical Social Worker    Final next level of care: Skilled Nursing Facility Barriers to Discharge: Barriers Resolved   Patient Goals and CMS Choice            Discharge Placement              Patient [facility] bed at: [facility] Patient to be transferred to facility by: [facility] Name of family member notified: neice Patient and family notified of of transfer: 07/27/23  Discharge Plan and Services Additional resources added to the After Visit Summary for   In-house Referral: Clinical Social Work                                   Social Drivers of Health (SDOH) Interventions SDOH Screenings   Food Insecurity: No Food Insecurity (07/13/2023)  Housing: Low Risk  (07/13/2023)  Transportation Needs: No Transportation Needs (07/13/2023)  Utilities: Not At Risk (07/13/2023)  Alcohol  Screen: Low Risk  (08/05/2022)  Depression (PHQ2-9): Low Risk  (01/22/2023)  Financial Resource Strain: Low Risk  (08/05/2022)  Physical Activity: Inactive (08/05/2022)  Social Connections: Socially Isolated (07/16/2023)  Stress: No Stress Concern Present (08/05/2022)  Tobacco Use: High Risk (06/23/2023)     Readmission Risk Interventions     No data to display

## 2023-07-27 NOTE — Discharge Instructions (Addendum)
 You were hospitalized for severe shortness of breath. This was likely because of a lot of extra fluid on your lungs. We did some tests and took pictures that helped us  find the right amount of fluid pills to help you feel better. You were very anxious during the hospital stay, so we started a new anxiety medication called buspirone  (or Buspar ). By the day of your discharge, you were walking well, and we thought you were medically safe to work on your strength at a skilled nursing facility. Thank you for allowing us  to be part of your care.   We arranged for you to follow up at:  Call and make an appointment with your primary care doctor for within 2 weeks. When scheduling the appointment say that this appointment is for a "hospital follow up" and they will work hard to get you in soon. Ambrosio Junker, MD [address] [place] [address] [phone]  Please note these changes made to your medications:   *Please START taking:    a. Spironolactone  25 mg every day   b. Breztri , 2 puffs, two times daily   c. Ipratropium as needed for shortness of breath   d. Buspirone  15 mg TID for anxiety   e. Trazodone  25 mg at bedtime    f. Melatonin 3 mg at bedtime    g. Miralax  as needed for bowel movements   h. Furosemide  40 mg every day, take an extra pill if you gain 3 lbs in one day or 5 lbs in a week  *Please STOP taking:    a. Amlodipine    b. Hydroxyzine    c. Trelegy inhaler   d. Prednisone   Please make sure to return to the ED if you become very short of breath and do no improve after using ipratropium, have severe chest pain that does not improve after taking pain medication.  Please call our clinic if you have any questions or concerns, we may be able to help and keep you from a long and expensive emergency room wait. Our clinic and after hours phone number is [phone], the best time to call is Monday through Friday 9 am to 4 pm but there is always someone available 24/7 if you have an  emergency. If you need medication refills please notify your pharmacy one week in advance and they will send us  a request.

## 2023-07-27 NOTE — Progress Notes (Signed)
 Mobility Specialist Progress Note:   07/27/23 1116  Mobility  Activity Transferred from chair to bed  Level of Assistance Contact guard assist, steadying assist  Assistive Device Other (Comment) (HHA)  Distance Ambulated (ft) 5 ft  Activity Response Tolerated well  Mobility Referral Yes  Mobility visit 1 Mobility  Mobility Specialist Start Time (ACUTE ONLY) 1100  Mobility Specialist Stop Time (ACUTE ONLY) 1105  Mobility Specialist Time Calculation (min) (ACUTE ONLY) 5 min   Pt received in chair, requesting assistance to transfer back to bed. HHA used for stability. Tolerated well, VSS throughout. Left pt in care of nursing. All needs met, call bell and phone in reach.    Quierra Silverio Mobility Specialist Please contact via Special educational needs teacher or  Rehab office at [phone]

## 2023-07-27 NOTE — Care Management Important Message (Signed)
 Important Message  Patient Details  Name: [name] MRN: [id number] Date of Birth: Jan 11, 1936   Important Message Given:  Yes - Medicare IM     Janith Melnick 07/27/2023, 11:12 AM

## 2023-07-27 NOTE — Discharge Summary (Addendum)
 Name: [name] MRN: [id number] DOB: Jan 08, 1936 88 y.o. PCP: Laneta Pintos, MD  Date of Admission: 07/11/2023  6:14 AM Date of Discharge: 07/27/23  Attending Physician: Dr.  Lelia Putnam Discharge Condition: Stable Discharge Destination: Skilled nursing facility PT / OT Needs: Skilled nursing facility short term rehab <3 hr/d  Discharge Diagnosis: Active Problems:   Essential hypertension   Protein calorie malnutrition (HCC)   Volume overload   Left arm swelling   Anxiety   Heart failure with preserved ejection fraction (HCC)   Anemia    Discharge Medications: Allergies as of 07/27/2023   No Known Allergies      Medication List     STOP taking these medications    albuterol  108 (90 Base) MCG/ACT inhaler Commonly known as: VENTOLIN  HFA   amLODipine  10 MG tablet Commonly known as: NORVASC    hydrOXYzine  50 MG tablet Commonly known as: ATARAX    ipratropium-albuterol  0.5-2.5 (3) MG/3ML Soln Commonly known as: DUONEB   predniSONE  20 MG tablet Commonly known as: DELTASONE    Trelegy Ellipta  100-62.5-25 MCG/ACT Aepb Generic drug: Fluticasone -Umeclidin-Vilant       TAKE these medications    acetaminophen  500 MG tablet Commonly known as: TYLENOL  Take 500 mg by mouth See admin instructions. Give 1 tablet (500mg ) by mouth every 6 hours as needed for pain management (patient will request her night time dose).   aspirin  EC 81 MG tablet Take 81 mg by mouth at bedtime.   budeson-glycopyrrolate -formoterol  160-9-4.8 MCG/ACT Aero inhaler Commonly known as: BREZTRI  Inhale 2 puffs into the lungs 2 (two) times daily.   busPIRone  15 MG tablet Commonly known as: BUSPAR  Take 1 tablet (15 mg total) by mouth 3 (three) times daily.   carboxymethylcellulose 0.5 % Soln Commonly known as: REFRESH PLUS Place 1 drop into both eyes daily as needed (dry eyes).   citalopram  20 MG tablet Commonly known as: CELEXA  Take 20 mg by mouth daily.   furosemide  40 MG  tablet Commonly known as: LASIX  Take 1 tablet (40 mg total) by mouth daily. Start taking on: July 28, 2023   guaiFENesin  600 MG 12 hr tablet Commonly known as: MUCINEX  Take 600 mg by mouth 2 (two) times daily.   halobetasol  0.05 % cream Commonly known as: ULTRAVATE  Use twice per week as needed for rash on scalp. What changed:  how much to take how to take this when to take this additional instructions   ipratropium 0.02 % nebulizer solution Commonly known as: ATROVENT  Take 2.5 mLs (0.5 mg total) by nebulization every 6 (six) hours as needed for wheezing or shortness of breath.   melatonin 3 MG Tabs tablet Take 1 tablet (3 mg total) by mouth at bedtime as needed.   pantoprazole  20 MG tablet Commonly known as: PROTONIX  Take 1 tablet by mouth once daily   polyethylene glycol 17 g packet Commonly known as: MIRALAX  / GLYCOLAX  Take 17 g by mouth daily as needed.   simvastatin  20 MG tablet Commonly known as: ZOCOR  Take 1 tablet by mouth once daily What changed: when to take this   spironolactone  25 MG tablet Commonly known as: ALDACTONE  Take 1 tablet (25 mg total) by mouth daily. Start taking on: July 28, 2023   traZODone  50 MG tablet Commonly known as: DESYREL  Take 0.5 tablets (25 mg total) by mouth at bedtime.   vitamin E  180 MG (400 UNITS) capsule Take 400 Units by mouth at bedtime.        Disposition and follow-up:   Gina  NYALAH Jimenez was discharged from [facility] in Stable condition.  At the hospital follow up visit please address:  1.  Follow-up:  a. Pulmonary effusions/Acute hypoxic respiratory failure: assess O2 needs and volume status. Medication regimen listed below (inhalers and GDMT were adjusted)   b. Anxiety: started on new medication regimen, see below. Assess symptoms   C. Goals of care: referred to outpatient palliative care  2.  Labs / imaging needed at time of follow-up:  a. CBC  b. BNP  3.  Medication  Changes  Started:   a. Spironolactone  25 mg every day   b. Breztri , 2 puffs, two times daily   c. Ipratropium as needed for shortness of breath   d. Buspirone  15 mg TID for anxiety   e. Trazodone  25 mg at bedtime    f. Melatonin 3 mg at bedtime    g. Miralax  as needed for bowel movements   h. Furosemide  40 mg every day, take an extra pill if you gain 3 lbs in one day or 5 lbs in a week  Stopped:   a. Amlodipine    b. Hydroxyzine    c. Trelegy inhaler   d. Prednisone   Changed:    a. Furosemide  40 mg every day, with an extra pill if she gains 3 lbs in a day or 5 lbs in a week.  Follow-up Appointments:  Follow-up Information     Laneta Pintos, MD. Call.   Specialty: Family Medicine Contact information: [address] [place] [address] [phone]                 Hospital Course by problem list:  [name] is a 88 y.o. with severe COPD not on O2 at baseline, aortic stenosis s/p TAVR, complete heart block with pacemaker and HFpEF who was discharged 8 days ago after being discharged from the ICU for a COPD exacerbation who now presents with dyspnea and wheezing is admitted for acute on chronic hypoxic and hypercapnic respiratory failure secondary to COPD and HFpEF exacerbation with fluid overload.  Respiratory failure with hypoxia and hypercapnia (HCC) COPD exacerbation (HCC) The patient was discharged 8 days ago for a COPD exacerbation to [facility]. She presented via EMS with severe shortness of breath, tachypnea, diffuse wheezing and LUE edema. She was initially placed on BiPAP then weaned to 3 L [place]. In the ED, she received nebulized albuterol  Q4H, IV furosemide , vancomycin  and cefepime  on top of her home nebulized budesonide , arformoterol , and revefenacin . Her labs demonstrated BNP of 805.7, WBC of 18.2, hsTroponin 31, and negative Flu A/B, RSV and COVID swab. CXR showed increase in bibasilar airspace disease. The etiology of her  exacerbation is not known but procalcitonin on hospital day 2 is suggestive of bacterial pneumonia. She was transferred to the progressive unit on 3 L [place]. She received 3 days of ceftriaxone  1 g BID and 5 days of azithromycin  500 mg QD for CAP coverage. She had intermittent episodes of SpO2 decreases that required BiPAP. As her anxiety improved, she did not require BiPAP treatment. Her oxygen requirement decreased from a peak of 8 L by nasal cannula, to 1 L. At time of discharge, her oxygen requirement was room air, though she preferred 1L [place] for her anxiety, with ambulation oxygenation requirement of 2L.   Heart failure with preserved ejection fraction (HCC) Volume overload Left arm swelling Patient presented with picture consistent with a component of HF contribution. At presentation, her distal LUE  was tense and edematous. She received IV and oral furosemide  for symptoms of volume overload. She also received acetazolamide  for contraction alkalosis. At time of discharge her left arm was swollen but much improved than on admission. She was discharged on lasix  40 mg daily and spironolactone  25 mg daily. We discussed the importance of daily weights and outlined how to take PRN lasix  above her daily 40mg  dose.    Anxiety In the ED, anxiety was a barrier to BiPAP treatment with complaints of claustrophobia like symptoms. After discussing benefits of treatment she tolerated BiPAP well. After this, she still continued to have anxiety and panic attacks throughout her hospitalization. She continued citalopram  while inpatient and requested PRN hydroxyzine  regularly. She was started on buspirone  7.5 mg BID, which was eventually increased to 15 mg TID. She was trialed on a one time dose of morphine  5 mg to see if this could address her anxiety and air hunger which helped when provided at night. As her anxiety improved with buspirone , the morphine  was discontinued. Her anxiety regarding movement continued to improve as  she was diuresed. She was able to tolerate walking every day on 2L of oxygen at time of discharge.     Hypertension Discontinued amlodipine  because of low diastolic pressures. Transitioned to spironolactone  25 mg for benefits to BP, HFpEF and diuresis.   Normocytic anemia Iron  deficiency  Hb stable at 10.8 on the day of discharge. She received IV iron  while inpatient and would recommend repeat iron  studies as an outpatient.   Advance care planning Upon admission, patient was full code. After initial recommendations, she was amenable to having further discussion with palliative care team. Palliative care was consulted and resulted in decision to declare DNR/DNI. She will follow up with palliative care as an outpatient.   Discharge Subjective: Ms. MOSE SELVIDGE was feeling much better. She was happy that she was able to walk around yesterday, though she felt much weaker than before.  Discharge Exam:   BP (!) 140/44 (BP Location: Left Arm)   Pulse 80   Temp 97.7 F (36.5 C) (Oral)   Resp 20   Ht 5\' 3"  (1.6 m)   Wt 46.9 kg   SpO2 96%   BMI 18.32 kg/m  GEN: Frail, elderly woman in no acute distress, lying comfortably in bed, conversing without issue, CV: No JVD, regular rate PULM: Diminished lung sounds in the bases, with improved air movement EXT: No lower extremity edema, L antecubital edematous  Pertinent Labs, Studies, and Procedures:     Latest Ref Rng & Units 07/25/2023    3:41 AM 07/24/2023    4:11 AM 07/23/2023    3:07 AM  CBC  WBC 4.0 - 10.5 K/uL 5.8  5.3  5.3   Hemoglobin 12.0 - 15.0 g/dL 16.1  09.6  04.5   Hematocrit 36.0 - 46.0 % 34.2  31.8  31.9   Platelets 150 - 400 K/uL 186  175  172        Latest Ref Rng & Units 07/27/2023    3:23 AM 07/26/2023    3:29 AM 07/25/2023    3:41 AM  CMP  Glucose 70 - 99 mg/dL 409  811  914   BUN 8 - 23 mg/dL 25  29  28    Creatinine 0.44 - 1.00 mg/dL 7.82  9.56  2.13   Sodium 135 - 145 mmol/L 137  137  139   Potassium 3.5 - 5.1  mmol/L 4.3  4.5  3.4  Chloride 98 - 111 mmol/L 106  105  104   CO2 22 - 32 mmol/L 23  25  26    Calcium 8.9 - 10.3 mg/dL 8.5  8.8  9.0     UE VENOUS DUPLEX (7am - 7pm) Result Date: 07/11/2023 UPPER VENOUS STUDY  Patient Name:  ANNESOPHIE SURI  Date of Exam:   07/11/2023 Medical Rec #: [id number]       Accession #:    [id number] Date of Birth: [birth year]        Patient Gender: F Patient Age:   69 years Exam Location:  [facility] Procedure:      VAS US  UPPER EXTREMITY VENOUS DUPLEX Referring Phys: Celesta Coke --------------------------------------------------------------------------------  Indications: Edema Comparison Study: No prior study on file Performing Technologist: Carleene Chase RVS  Examination Guidelines: A complete evaluation includes B-mode imaging, spectral Doppler, color Doppler, and power Doppler as needed of all accessible portions of each vessel. Bilateral testing is considered an integral part of a complete examination. Limited examinations for reoccurring indications may be performed as noted.  Right Findings: +----------+------------+---------+-----------+----------+-------+ RIGHT     CompressiblePhasicitySpontaneousPropertiesSummary +----------+------------+---------+-----------+----------+-------+ Subclavian               Yes       Yes                      +----------+------------+---------+-----------+----------+-------+  Left Findings: +----------+------------+---------+-----------+----------+-------+ LEFT      CompressiblePhasicitySpontaneousPropertiesSummary +----------+------------+---------+-----------+----------+-------+ IJV           Full       Yes       Yes                      +----------+------------+---------+-----------+----------+-------+ Subclavian               Yes       Yes                      +----------+------------+---------+-----------+----------+-------+ Axillary      Full       Yes       Yes                       +----------+------------+---------+-----------+----------+-------+ Brachial      Full       Yes       Yes                      +----------+------------+---------+-----------+----------+-------+ Radial        Full                                          +----------+------------+---------+-----------+----------+-------+ Ulnar         Full                                          +----------+------------+---------+-----------+----------+-------+ Cephalic      Full                                          +----------+------------+---------+-----------+----------+-------+ Basilic       Full                                          +----------+------------+---------+-----------+----------+-------+  Summary:  Right: No evidence of thrombosis in the subclavian.  Left: No evidence of deep vein thrombosis in the upper extremity. No evidence of superficial vein thrombosis in the upper extremity.  *See table(s) above for measurements and observations.  Diagnosing physician: Irvin Mantel Electronically signed by Irvin Mantel on 07/11/2023 at 2:03:39 PM.    Final    DG Chest Portable 1 View Result Date: 07/11/2023 CLINICAL DATA:  Shortness of breath and respiratory distress. EXAM: PORTABLE CHEST 1 VIEW COMPARISON:  06/27/2023 FINDINGS: Lungs are hyperexpanded. Interval increase in bibasilar airspace disease, compatible with atelectasis or infiltrate. Probable tiny bilateral pleural effusions. Increased vascular congestion. The cardiopericardial silhouette is within normal limits for size. Status post TAVR. Left dual lead permanent pacemaker remains in place. Telemetry leads overlie the chest. IMPRESSION: 1. Interval increase in bibasilar airspace disease, compatible with atelectasis or infiltrate. 2. Increased vascular congestion with probable tiny bilateral pleural effusions. Electronically Signed   By: Donnal Fusi M.D.   On: 07/11/2023 07:06      Signed: Zachary Billman, Medical  Student  [place] Noori, DO 07/27/2023, 11:26 AM

## 2023-07-27 NOTE — Progress Notes (Signed)
 Physical Therapy Treatment Patient Details Name: [name] MRN: [id number] DOB: [birth year] Today's Date: 07/27/2023   History of Present Illness Pt is a 88 y.o. female admitted 07/11/23 for respiratory failure with hypoxia and hypercapnia. Pt presented with SOB and increased WOB. Workup was notable for mild acidosis, borderline high CO2 on her VBG.  PMH of COPD on 2-3L O2 at baseline, COVID Jan 2025, TAVR, PPM, GIB, endometrial CA, HTN, HLD, CAD, mild pulmonary hypertension. Of note, recent hospitalization for COPD exacerbation which required a stay in the ICU.    PT Comments  Patient requesting BSC on arrival. Offered to assist her to walk to bathroom and she is afraid she can not hold it long enough and will have an accident. Transfers with/without RW with CGA for safety due to O2 tubing and lines. Patient reported needing assist with pericare to "save (her) energy for walking." Progressed to ambulation with CGA on RA with sats >90% until she sat down at end of walk with drop to 88% and pt becoming panicked asking for O2 to be resumed. Placed on 1L with sats quickly 92%. Patient requesting purewick and explained benefit of getting up to [facility]. States she cannot make it fast enough, especially when given lasix . States she normally wears depends-like undergarment at home. No purewicks currently in stock. Patient and nurse tech made aware.     If plan is discharge home, recommend the following: A little help with walking and/or transfers;A lot of help with bathing/dressing/bathroom;Assist for transportation;Help with stairs or ramp for entrance;Assistance with cooking/housework   Can travel by private vehicle     Yes  Equipment Recommendations  Wheelchair (measurements PT);Wheelchair cushion (measurements PT)    Recommendations for Other Services       Precautions / Restrictions Precautions Precautions: Fall Recall of Precautions/Restrictions: Intact Precaution/Restrictions Comments: Watch  SpO2 Restrictions Weight Bearing Restrictions Per Provider Order: No     Mobility  Bed Mobility Overal bed mobility: Needs Assistance Bed Mobility: Supine to Sit, Sit to Supine Rolling: Modified independent (Device/Increase time)   Supine to sit: Modified independent (Device/Increase time), HOB elevated Sit to supine: Modified independent (Device/Increase time)   General bed mobility comments: HOB elevated; will need to practice with HOB flat and in timely manner    Transfers Overall transfer level: Needs assistance Equipment used: Rolling walker (2 wheels) Transfers: Sit to/from Stand, Bed to chair/wheelchair/BSC Sit to Stand: Contact guard assist   Step pivot transfers: Contact guard assist       General transfer comment: CGA for safety to/from BSC<>bed due to O2 tubing and lines    Ambulation/Gait Ambulation/Gait assistance: Contact guard assist Gait Distance (Feet): 180 Feet Assistive device: Rolling walker (2 wheels) Gait Pattern/deviations: Step-through pattern, Decreased stride length Gait velocity: reduced     General Gait Details: Patient on RA with sats >90% until return to sitting at EOB with drop to 88%; pt beginning to feel panicked and insisted on resume O2 via [place]; applied 1L with sats quickly up to 92%   Stairs             Wheelchair Mobility     Tilt Bed    Modified Rankin (Stroke Patients Only)       Balance Overall balance assessment: Needs assistance Sitting-balance support: No upper extremity supported, Feet supported Sitting balance-Leahy Scale: Fair Sitting balance - Comments: required assist to don socks due to feels decr respiratory status when she tries   Standing balance support: Bilateral upper extremity supported, Single  extremity supported, During functional activity Standing balance-Leahy Scale: Fair Standing balance comment: able to static stand without UE support; bil UE support for dynamic balance                             Communication Communication Communication: Impaired Factors Affecting Communication: Hearing impaired  Cognition Arousal: Alert Behavior During Therapy: WFL for tasks assessed/performed, Anxious   PT - Cognitive impairments: No apparent impairments                         Following commands: Intact      Cueing Cueing Techniques: Verbal cues  Exercises      General Comments General comments (skin integrity, edema, etc.): assisted on/off BSC with assist for pericare "so I can save my energy for walking"      Pertinent Vitals/Pain Pain Assessment Pain Assessment: No/denies pain    Home Living                          Prior Function            PT Goals (current goals can now be found in the care plan section) Acute Rehab PT Goals Patient Stated Goal: Return Home PT Goal Formulation: With patient/family Time For Goal Achievement: 08/10/23 Potential to Achieve Goals: Good Progress towards PT goals: Goals updated    Frequency    Min 1X/week      PT Plan      Co-evaluation              AM-PAC PT "6 Clicks" Mobility   Outcome Measure  Help needed turning from your back to your side while in a flat bed without using bedrails?: A Little Help needed moving from lying on your back to sitting on the side of a flat bed without using bedrails?: A Little Help needed moving to and from a bed to a chair (including a wheelchair)?: A Little Help needed standing up from a chair using your arms (e.g., wheelchair or bedside chair)?: A Little Help needed to walk in hospital room?: A Little Help needed climbing 3-5 steps with a railing? : Total 6 Click Score: 16    End of Session Equipment Utilized During Treatment: Oxygen Activity Tolerance: Patient limited by fatigue Patient left: with call bell/phone within reach;in bed Nurse Communication: Mobility status;Other (comment) (no purewicks on floor) PT Visit Diagnosis: Other  abnormalities of gait and mobility (R26.89);Muscle weakness (generalized) (M62.81)     Time: 4098-1191 PT Time Calculation (min) (ACUTE ONLY): 25 min  Charges:    $Gait Training: 23-37 mins PT General Charges $$ ACUTE PT VISIT: 1 Visit                      Gayle Kava, PT Acute Rehabilitation Services  Office [phone]    Guilford Leep 07/27/2023, 9:11 AM

## 2023-07-27 NOTE — Progress Notes (Signed)
 Mobility Specialist Progress Note:   07/27/23 1028  Mobility  Activity Ambulated with assistance in room;Ambulated with assistance in hallway;Transferred from bed to chair  Level of Assistance Standby assist, set-up cues, supervision of patient - no hands on  Assistive Device BSC;Front wheel walker  Distance Ambulated (ft) 210 ft  Activity Response Tolerated well  Mobility Referral Yes  Mobility visit 1 Mobility  Mobility Specialist Start Time (ACUTE ONLY) 1005  Mobility Specialist Stop Time (ACUTE ONLY) 1015  Mobility Specialist Time Calculation (min) (ACUTE ONLY) 10 min   Pt received on BSC, agreeable to mobility session. Ambulated in room and hallway with RW and SBA. Tolerated well, asx throughout, SpO2 94-100% on 2L. Returned pt to room, agreeable to sit up in chair. Left with all needs met, call bell and phone in reach.    Danila Eddie Mobility Specialist Please contact via Special educational needs teacher or  Rehab office at [phone]

## 2023-07-27 NOTE — TOC Progression Note (Signed)
 Transition of Care [facility]) - Progression Note    Patient Details  Name: [name] MRN: [id number] Date of Birth: 30-Jan-1936  Transition of [facility]) CM/SW Contact  Valery Gaucher, [place] Phone Number: 07/27/2023, 9:50 AM  Clinical Narrative:     Insurance is pending   Expected Discharge Plan: Skilled Nursing Facility Barriers to Discharge: English as a second language teacher  Expected Discharge Plan and Services In-house Referral: Clinical Social Work     Living arrangements for the past 2 months: Single Family Home                                       Social Determinants of Health (SDOH) Interventions SDOH Screenings   Food Insecurity: No Food Insecurity (07/13/2023)  Housing: Low Risk  (07/13/2023)  Transportation Needs: No Transportation Needs (07/13/2023)  Utilities: Not At Risk (07/13/2023)  Alcohol  Screen: Low Risk  (08/05/2022)  Depression (PHQ2-9): Low Risk  (01/22/2023)  Financial Resource Strain: Low Risk  (08/05/2022)  Physical Activity: Inactive (08/05/2022)  Social Connections: Socially Isolated (07/16/2023)  Stress: No Stress Concern Present (08/05/2022)  Tobacco Use: High Risk (06/23/2023)    Readmission Risk Interventions     No data to display

## 2023-07-27 NOTE — Plan of Care (Signed)
   Palliative Medicine Inpatient Follow Up Note   Chart reviewed for [name] - plan for discharge to [name] today.  Plan for OP Palliative care support on discharge.  No Charge ______________________________________________________________________________________ [facility] [place] Palliative Medicine Team Team Cell Phone: [phone] Please utilize secure chat with additional questions, if there is no response within 30 minutes please call the above phone number  Palliative Medicine Team providers are available by phone from 7am to 7pm daily and can be reached through the team cell phone.  Should this patient require assistance outside of these hours, please call the patient's attending physician.

## 2023-07-27 NOTE — Progress Notes (Signed)
 Patient report given to Charles,LPN of [facility],all  questions were answered

## 2023-07-30 DIAGNOSIS — I130 Hypertensive heart and chronic kidney disease with heart failure and stage 1 through stage 4 chronic kidney disease, or unspecified chronic kidney disease: Secondary | ICD-10-CM | POA: Diagnosis not present

## 2023-07-30 DIAGNOSIS — Z515 Encounter for palliative care: Secondary | ICD-10-CM | POA: Diagnosis not present

## 2023-07-30 DIAGNOSIS — E8770 Fluid overload, unspecified: Secondary | ICD-10-CM | POA: Diagnosis not present

## 2023-07-30 DIAGNOSIS — I5030 Unspecified diastolic (congestive) heart failure: Secondary | ICD-10-CM | POA: Diagnosis not present

## 2023-07-30 DIAGNOSIS — N189 Chronic kidney disease, unspecified: Secondary | ICD-10-CM | POA: Diagnosis not present

## 2023-08-05 DIAGNOSIS — D631 Anemia in chronic kidney disease: Secondary | ICD-10-CM | POA: Diagnosis not present

## 2023-08-05 DIAGNOSIS — F411 Generalized anxiety disorder: Secondary | ICD-10-CM | POA: Diagnosis not present

## 2023-08-05 DIAGNOSIS — I5030 Unspecified diastolic (congestive) heart failure: Secondary | ICD-10-CM | POA: Diagnosis not present

## 2023-08-05 DIAGNOSIS — N182 Chronic kidney disease, stage 2 (mild): Secondary | ICD-10-CM | POA: Diagnosis not present

## 2023-08-06 DIAGNOSIS — Z515 Encounter for palliative care: Secondary | ICD-10-CM | POA: Diagnosis not present

## 2023-08-07 DIAGNOSIS — J441 Chronic obstructive pulmonary disease with (acute) exacerbation: Secondary | ICD-10-CM | POA: Diagnosis not present

## 2023-08-07 DIAGNOSIS — I5030 Unspecified diastolic (congestive) heart failure: Secondary | ICD-10-CM | POA: Diagnosis not present

## 2023-08-07 DIAGNOSIS — F411 Generalized anxiety disorder: Secondary | ICD-10-CM | POA: Diagnosis not present

## 2023-08-07 DIAGNOSIS — E8770 Fluid overload, unspecified: Secondary | ICD-10-CM | POA: Diagnosis not present

## 2023-08-18 ENCOUNTER — Inpatient Hospital Stay: Admitting: Family Medicine

## 2023-08-19 DIAGNOSIS — I5030 Unspecified diastolic (congestive) heart failure: Secondary | ICD-10-CM | POA: Diagnosis not present

## 2023-08-19 DIAGNOSIS — F411 Generalized anxiety disorder: Secondary | ICD-10-CM | POA: Diagnosis not present

## 2023-08-19 DIAGNOSIS — J441 Chronic obstructive pulmonary disease with (acute) exacerbation: Secondary | ICD-10-CM | POA: Diagnosis not present

## 2023-08-19 DIAGNOSIS — Z515 Encounter for palliative care: Secondary | ICD-10-CM | POA: Diagnosis not present

## 2023-08-19 DIAGNOSIS — J9621 Acute and chronic respiratory failure with hypoxia: Secondary | ICD-10-CM | POA: Diagnosis not present

## 2023-08-21 DIAGNOSIS — J441 Chronic obstructive pulmonary disease with (acute) exacerbation: Secondary | ICD-10-CM | POA: Diagnosis not present

## 2023-08-21 DIAGNOSIS — I5030 Unspecified diastolic (congestive) heart failure: Secondary | ICD-10-CM | POA: Diagnosis not present

## 2023-08-21 DIAGNOSIS — E8770 Fluid overload, unspecified: Secondary | ICD-10-CM | POA: Diagnosis not present

## 2023-08-21 DIAGNOSIS — J9601 Acute respiratory failure with hypoxia: Secondary | ICD-10-CM | POA: Diagnosis not present

## 2023-08-31 DIAGNOSIS — J441 Chronic obstructive pulmonary disease with (acute) exacerbation: Secondary | ICD-10-CM | POA: Diagnosis not present

## 2023-08-31 DIAGNOSIS — N182 Chronic kidney disease, stage 2 (mild): Secondary | ICD-10-CM | POA: Diagnosis not present

## 2023-08-31 DIAGNOSIS — F419 Anxiety disorder, unspecified: Secondary | ICD-10-CM | POA: Diagnosis not present

## 2023-08-31 DIAGNOSIS — I5030 Unspecified diastolic (congestive) heart failure: Secondary | ICD-10-CM | POA: Diagnosis not present

## 2023-09-10 DIAGNOSIS — J9601 Acute respiratory failure with hypoxia: Secondary | ICD-10-CM | POA: Diagnosis not present

## 2023-09-10 DIAGNOSIS — I5030 Unspecified diastolic (congestive) heart failure: Secondary | ICD-10-CM | POA: Diagnosis not present

## 2023-09-10 DIAGNOSIS — R7989 Other specified abnormal findings of blood chemistry: Secondary | ICD-10-CM | POA: Diagnosis not present

## 2023-09-10 DIAGNOSIS — Z515 Encounter for palliative care: Secondary | ICD-10-CM | POA: Diagnosis not present

## 2023-09-21 DIAGNOSIS — F411 Generalized anxiety disorder: Secondary | ICD-10-CM | POA: Diagnosis not present

## 2023-09-21 DIAGNOSIS — Z515 Encounter for palliative care: Secondary | ICD-10-CM | POA: Diagnosis not present

## 2023-09-21 DIAGNOSIS — J441 Chronic obstructive pulmonary disease with (acute) exacerbation: Secondary | ICD-10-CM | POA: Diagnosis not present

## 2023-09-21 DIAGNOSIS — I5030 Unspecified diastolic (congestive) heart failure: Secondary | ICD-10-CM | POA: Diagnosis not present

## 2023-10-20 ENCOUNTER — Ambulatory Visit (INDEPENDENT_AMBULATORY_CARE_PROVIDER_SITE_OTHER): Payer: Medicare HMO

## 2023-10-20 DIAGNOSIS — I442 Atrioventricular block, complete: Secondary | ICD-10-CM

## 2023-10-26 ENCOUNTER — Ambulatory Visit

## 2023-10-27 LAB — CUP PACEART REMOTE DEVICE CHECK
Battery Remaining Longevity: 91 mo
Battery Voltage: 2.98 V
Brady Statistic AP VP Percent: 3.94 %
Brady Statistic AP VS Percent: 0 %
Brady Statistic AS VP Percent: 95.01 %
Brady Statistic AS VS Percent: 1.04 %
Brady Statistic RA Percent Paced: 4.05 %
Brady Statistic RV Percent Paced: 98.95 %
Date Time Interrogation Session: 20250728122644
Implantable Lead Connection Status: 753985
Implantable Lead Connection Status: 753985
Implantable Lead Implant Date: 20200722
Implantable Lead Implant Date: 20200722
Implantable Lead Location: 753859
Implantable Lead Location: 753860
Implantable Lead Model: 1944
Implantable Lead Model: 1948
Implantable Pulse Generator Implant Date: 20200722
Lead Channel Impedance Value: 323 Ohm
Lead Channel Impedance Value: 380 Ohm
Lead Channel Impedance Value: 418 Ohm
Lead Channel Impedance Value: 551 Ohm
Lead Channel Pacing Threshold Amplitude: 0.5 V
Lead Channel Pacing Threshold Amplitude: 0.625 V
Lead Channel Pacing Threshold Pulse Width: 0.4 ms
Lead Channel Pacing Threshold Pulse Width: 0.4 ms
Lead Channel Sensing Intrinsic Amplitude: 10.5 mV
Lead Channel Sensing Intrinsic Amplitude: 10.5 mV
Lead Channel Sensing Intrinsic Amplitude: 3.125 mV
Lead Channel Sensing Intrinsic Amplitude: 3.125 mV
Lead Channel Setting Pacing Amplitude: 1.5 V
Lead Channel Setting Pacing Amplitude: 2 V
Lead Channel Setting Pacing Pulse Width: 0.4 ms
Lead Channel Setting Sensing Sensitivity: 2 mV
Zone Setting Status: 755011
Zone Setting Status: 755011

## 2023-10-29 ENCOUNTER — Ambulatory Visit: Admitting: Primary Care

## 2023-10-29 ENCOUNTER — Ambulatory Visit: Payer: Self-pay | Admitting: Cardiology

## 2023-11-09 ENCOUNTER — Telehealth: Payer: Self-pay

## 2023-11-09 NOTE — Transitions of Care (Post Inpatient/ED Visit) (Signed)
   11/09/2023  Name: [name] MRN: [id number] DOB: 1935-06-17  Today's TOC FU Call Status: Today's TOC FU Call Status:: Unsuccessful Call (1st Attempt) Unsuccessful Call (1st Attempt) Date: 11/09/23  Attempted to reach the patient regarding the most recent Inpatient/ED visit.  Follow Up Plan: Additional outreach attempts will be made to reach the patient to complete the Transitions of Care (Post Inpatient/ED visit) call.   Signature Julian Lemmings, LPN [facility] Nurse Health Advisor Direct Dial [phone]

## 2023-11-10 NOTE — Transitions of Care (Post Inpatient/ED Visit) (Signed)
   11/10/2023  Name: [name] MRN: [id number] DOB: [birth year]  Today's TOC FU Call Status: Today's TOC FU Call Status:: Unsuccessful Call (1st Attempt) Unsuccessful Call (1st Attempt) Date: 11/09/23  Attempted to reach the patient regarding the most recent Inpatient/ED visit.  Follow Up Plan: No further outreach attempts will be made at this time. We have been unable to contact the patient. Hospice Signature Julian Lemmings, LPN [facility] Nurse Health Advisor Direct Dial [phone]

## 2023-11-30 DEATH — deceased

## 2024-01-01 NOTE — Progress Notes (Signed)
 Remote PPM Transmission

## 2024-01-19 ENCOUNTER — Ambulatory Visit

## 2024-01-25 ENCOUNTER — Ambulatory Visit

## 2024-04-19 ENCOUNTER — Ambulatory Visit

## 2024-04-25 ENCOUNTER — Ambulatory Visit

## 2024-07-19 ENCOUNTER — Ambulatory Visit

## 2024-07-25 ENCOUNTER — Ambulatory Visit

## 2024-10-18 ENCOUNTER — Ambulatory Visit

## 2024-10-24 ENCOUNTER — Ambulatory Visit

## 2025-01-17 ENCOUNTER — Ambulatory Visit

## 2025-01-23 ENCOUNTER — Ambulatory Visit

## 2025-04-18 ENCOUNTER — Ambulatory Visit
